# Patient Record
Sex: Female | Born: 1960 | Hispanic: Yes | Marital: Single | Smoking: Never smoker
Health system: Southern US, Community
[De-identification: ages and names within clinical notes are randomized; demographics above are authoritative.]

## PROBLEM LIST (undated history)

## (undated) DIAGNOSIS — D649 Anemia, unspecified: Secondary | ICD-10-CM

## (undated) DIAGNOSIS — I428 Other cardiomyopathies: Secondary | ICD-10-CM

## (undated) DIAGNOSIS — I509 Heart failure, unspecified: Secondary | ICD-10-CM

## (undated) DIAGNOSIS — Z598 Other problems related to housing and economic circumstances: Secondary | ICD-10-CM

## (undated) DIAGNOSIS — M25569 Pain in unspecified knee: Secondary | ICD-10-CM

## (undated) DIAGNOSIS — G8929 Other chronic pain: Secondary | ICD-10-CM

## (undated) DIAGNOSIS — R7989 Other specified abnormal findings of blood chemistry: Secondary | ICD-10-CM

## (undated) DIAGNOSIS — J189 Pneumonia, unspecified organism: Secondary | ICD-10-CM

## (undated) DIAGNOSIS — I4891 Unspecified atrial fibrillation: Secondary | ICD-10-CM

## (undated) DIAGNOSIS — C50919 Malignant neoplasm of unspecified site of unspecified female breast: Secondary | ICD-10-CM

## (undated) DIAGNOSIS — Z5989 Other problems related to housing and economic circumstances: Secondary | ICD-10-CM

## (undated) DIAGNOSIS — U071 COVID-19: Secondary | ICD-10-CM

## (undated) DIAGNOSIS — I5022 Chronic systolic (congestive) heart failure: Secondary | ICD-10-CM

## (undated) DIAGNOSIS — I493 Ventricular premature depolarization: Secondary | ICD-10-CM

## (undated) DIAGNOSIS — Z9119 Patient's noncompliance with other medical treatment and regimen: Secondary | ICD-10-CM

## (undated) DIAGNOSIS — Z5971 Insufficient health insurance coverage: Secondary | ICD-10-CM

## (undated) DIAGNOSIS — M1990 Unspecified osteoarthritis, unspecified site: Secondary | ICD-10-CM

## (undated) DIAGNOSIS — Z91199 Patient's noncompliance with other medical treatment and regimen due to unspecified reason: Secondary | ICD-10-CM

## (undated) DIAGNOSIS — E0590 Thyrotoxicosis, unspecified without thyrotoxic crisis or storm: Secondary | ICD-10-CM

## (undated) HISTORY — PX: BREAST CYST EXCISION: SHX579

## (undated) HISTORY — PX: TUBAL LIGATION: SHX77

---
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
## 2016-06-23 DIAGNOSIS — J189 Pneumonia, unspecified organism: Secondary | ICD-10-CM

## 2016-06-23 HISTORY — DX: Pneumonia, unspecified organism: J18.9

## 2016-07-19 DIAGNOSIS — I509 Heart failure, unspecified: Secondary | ICD-10-CM

## 2016-07-19 DIAGNOSIS — I429 Cardiomyopathy, unspecified: Secondary | ICD-10-CM

## 2016-07-21 DIAGNOSIS — I959 Hypotension, unspecified: Secondary | ICD-10-CM

## 2016-07-21 DIAGNOSIS — I5023 Acute on chronic systolic (congestive) heart failure: Secondary | ICD-10-CM

## 2016-07-21 DIAGNOSIS — I471 Supraventricular tachycardia: Secondary | ICD-10-CM

## 2016-07-21 DIAGNOSIS — J159 Unspecified bacterial pneumonia: Secondary | ICD-10-CM

## 2017-04-13 DIAGNOSIS — I420 Dilated cardiomyopathy: Secondary | ICD-10-CM

## 2017-04-13 DIAGNOSIS — R079 Chest pain, unspecified: Secondary | ICD-10-CM

## 2017-04-13 DIAGNOSIS — I5020 Unspecified systolic (congestive) heart failure: Secondary | ICD-10-CM

## 2017-04-13 DIAGNOSIS — I429 Cardiomyopathy, unspecified: Secondary | ICD-10-CM

## 2017-04-14 DIAGNOSIS — I509 Heart failure, unspecified: Secondary | ICD-10-CM

## 2017-04-15 ENCOUNTER — Ambulatory Visit (HOSPITAL_COMMUNITY): Admit: 2017-04-15 | Payer: Self-pay | Admitting: Internal Medicine

## 2017-04-15 ENCOUNTER — Encounter (HOSPITAL_COMMUNITY): Payer: Self-pay | Admitting: Physician Assistant

## 2017-04-15 ENCOUNTER — Encounter (HOSPITAL_COMMUNITY)
Admission: AD | Disposition: A | Discharge status: Home / Self Care | Payer: Self-pay | Source: Other Acute Inpatient Hospital | Attending: Cardiovascular Disease

## 2017-04-15 ENCOUNTER — Inpatient Hospital Stay (HOSPITAL_COMMUNITY)
Admission: AD | Admit: 2017-04-15 | Discharge: 2017-04-21 | DRG: 287 | Disposition: A | Discharge status: Home / Self Care | Payer: Self-pay | Source: Other Acute Inpatient Hospital | Attending: Cardiovascular Disease | Admitting: Cardiovascular Disease

## 2017-04-15 DIAGNOSIS — I5023 Acute on chronic systolic (congestive) heart failure: Principal | ICD-10-CM

## 2017-04-15 DIAGNOSIS — Z79899 Other long term (current) drug therapy: Secondary | ICD-10-CM

## 2017-04-15 DIAGNOSIS — I272 Pulmonary hypertension, unspecified: Secondary | ICD-10-CM | POA: Diagnosis present

## 2017-04-15 DIAGNOSIS — I240 Acute coronary thrombosis not resulting in myocardial infarction: Secondary | ICD-10-CM

## 2017-04-15 DIAGNOSIS — G8929 Other chronic pain: Secondary | ICD-10-CM | POA: Diagnosis present

## 2017-04-15 DIAGNOSIS — I429 Cardiomyopathy, unspecified: Secondary | ICD-10-CM | POA: Diagnosis present

## 2017-04-15 DIAGNOSIS — I513 Intracardiac thrombosis, not elsewhere classified: Secondary | ICD-10-CM

## 2017-04-15 DIAGNOSIS — I959 Hypotension, unspecified: Secondary | ICD-10-CM | POA: Diagnosis present

## 2017-04-15 DIAGNOSIS — I5022 Chronic systolic (congestive) heart failure: Secondary | ICD-10-CM

## 2017-04-15 DIAGNOSIS — I081 Rheumatic disorders of both mitral and tricuspid valves: Secondary | ICD-10-CM | POA: Diagnosis present

## 2017-04-15 DIAGNOSIS — I5021 Acute systolic (congestive) heart failure: Secondary | ICD-10-CM

## 2017-04-15 DIAGNOSIS — Z8249 Family history of ischemic heart disease and other diseases of the circulatory system: Secondary | ICD-10-CM

## 2017-04-15 DIAGNOSIS — I493 Ventricular premature depolarization: Secondary | ICD-10-CM | POA: Diagnosis present

## 2017-04-15 DIAGNOSIS — I420 Dilated cardiomyopathy: Secondary | ICD-10-CM

## 2017-04-15 HISTORY — DX: Unspecified osteoarthritis, unspecified site: M19.90

## 2017-04-15 HISTORY — DX: Acute coronary thrombosis not resulting in myocardial infarction: I24.0

## 2017-04-15 HISTORY — DX: Chronic systolic (congestive) heart failure: I50.22

## 2017-04-15 HISTORY — PX: CORONARY/GRAFT ANGIOGRAPHY: CATH118237

## 2017-04-15 HISTORY — DX: Other chronic pain: G89.29

## 2017-04-15 HISTORY — DX: Pain in unspecified knee: M25.569

## 2017-04-15 HISTORY — DX: Anemia, unspecified: D64.9

## 2017-04-15 HISTORY — PX: RIGHT HEART CATH: CATH118263

## 2017-04-15 HISTORY — DX: Pneumonia, unspecified organism: J18.9

## 2017-04-15 LAB — POCT I-STAT 3, VENOUS BLOOD GAS (G3P V)
ACID-BASE EXCESS: 2 mmol/L (ref 0.0–2.0)
Acid-Base Excess: 2 mmol/L (ref 0.0–2.0)
Acid-Base Excess: 3 mmol/L — ABNORMAL HIGH (ref 0.0–2.0)
BICARBONATE: 26.2 mmol/L (ref 20.0–28.0)
Bicarbonate: 26.6 mmol/L (ref 20.0–28.0)
Bicarbonate: 27 mmol/L (ref 20.0–28.0)
O2 SAT: 71 %
O2 SAT: 74 %
O2 Saturation: 70 %
PCO2 VEN: 39.3 mmHg — AB (ref 44.0–60.0)
PH VEN: 7.43 (ref 7.250–7.430)
TCO2: 27 mmol/L (ref 22–32)
TCO2: 28 mmol/L (ref 22–32)
TCO2: 28 mmol/L (ref 22–32)
pCO2, Ven: 38.9 mmHg — ABNORMAL LOW (ref 44.0–60.0)
pCO2, Ven: 39.6 mmHg — ABNORMAL LOW (ref 44.0–60.0)
pH, Ven: 7.443 — ABNORMAL HIGH (ref 7.250–7.430)
pH, Ven: 7.444 — ABNORMAL HIGH (ref 7.250–7.430)
pO2, Ven: 35 mmHg (ref 32.0–45.0)
pO2, Ven: 36 mmHg (ref 32.0–45.0)
pO2, Ven: 38 mmHg (ref 32.0–45.0)

## 2017-04-15 LAB — POCT I-STAT 3, ART BLOOD GAS (G3+)
ACID-BASE EXCESS: 1 mmol/L (ref 0.0–2.0)
BICARBONATE: 25 mmol/L (ref 20.0–28.0)
O2 Saturation: 96 %
TCO2: 26 mmol/L (ref 22–32)
pCO2 arterial: 35.9 mmHg (ref 32.0–48.0)
pH, Arterial: 7.451 — ABNORMAL HIGH (ref 7.350–7.450)
pO2, Arterial: 80 mmHg — ABNORMAL LOW (ref 83.0–108.0)

## 2017-04-15 LAB — HEMOGLOBIN A1C
HEMOGLOBIN A1C: 5.6 % (ref 4.8–5.6)
Mean Plasma Glucose: 114.02 mg/dL

## 2017-04-15 LAB — BRAIN NATRIURETIC PEPTIDE: B NATRIURETIC PEPTIDE 5: 444.4 pg/mL — AB (ref 0.0–100.0)

## 2017-04-15 SURGERY — RIGHT HEART CATH

## 2017-04-15 MED ORDER — HEPARIN SODIUM (PORCINE) 1000 UNIT/ML IJ SOLN
INTRAMUSCULAR | Status: AC
Start: 2017-04-15 — End: ?
  Filled 2017-04-15: qty 1

## 2017-04-15 MED ORDER — FENTANYL CITRATE (PF) 100 MCG/2ML IJ SOLN
INTRAMUSCULAR | Status: AC
  Filled 2017-04-15: qty 2

## 2017-04-15 MED ORDER — MIDAZOLAM HCL 2 MG/2ML IJ SOLN
INTRAMUSCULAR | Status: AC
  Filled 2017-04-15: qty 2

## 2017-04-15 MED ORDER — SODIUM CHLORIDE 0.9% FLUSH
3.0000 mL | Freq: Two times a day (BID) | INTRAVENOUS | Status: DC
  Administered 2017-04-15 – 2017-04-21 (×7): 3 mL via INTRAVENOUS

## 2017-04-15 MED ORDER — VERAPAMIL HCL 2.5 MG/ML IV SOLN
INTRAVENOUS | Status: DC | PRN
  Administered 2017-04-15: 18:00:00 via INTRA_ARTERIAL

## 2017-04-15 MED ORDER — HEPARIN (PORCINE) IN NACL 2-0.9 UNIT/ML-% IJ SOLN
INTRAMUSCULAR | Status: AC
  Filled 2017-04-15: qty 1000

## 2017-04-15 MED ORDER — SODIUM CHLORIDE 0.9% FLUSH: 3.0000 mL | INTRAVENOUS | Status: DC | PRN

## 2017-04-15 MED ORDER — IOPAMIDOL (ISOVUE-370) INJECTION 76%
INTRAVENOUS | Status: DC | PRN
  Administered 2017-04-15: 30 mL via INTRA_ARTERIAL

## 2017-04-15 MED ORDER — SODIUM CHLORIDE 0.9 % IV SOLN
INTRAVENOUS | Status: DC
  Administered 2017-04-15: 17:00:00 via INTRAVENOUS

## 2017-04-15 MED ORDER — MIDAZOLAM HCL 2 MG/2ML IJ SOLN
INTRAMUSCULAR | Status: DC | PRN
  Administered 2017-04-15: 0.5 mg via INTRAVENOUS

## 2017-04-15 MED ORDER — ASPIRIN EC 81 MG PO TBEC
81.0000 mg | DELAYED_RELEASE_TABLET | Freq: Every day | ORAL | Status: DC
  Administered 2017-04-16 – 2017-04-21 (×6): 81 mg via ORAL
  Filled 2017-04-15 (×6): qty 1

## 2017-04-15 MED ORDER — NITROGLYCERIN 0.4 MG SL SUBL
0.4000 mg | SUBLINGUAL_TABLET | SUBLINGUAL | Status: DC | PRN
Start: 2017-04-15 — End: 2017-04-21

## 2017-04-15 MED ORDER — SODIUM CHLORIDE 0.9 % IV SOLN: 250.0000 mL | INTRAVENOUS | Status: DC | PRN

## 2017-04-15 MED ORDER — SODIUM CHLORIDE 0.9% FLUSH: 3.0000 mL | Freq: Two times a day (BID) | INTRAVENOUS | Status: DC

## 2017-04-15 MED ORDER — HEPARIN SODIUM (PORCINE) 1000 UNIT/ML IJ SOLN
INTRAMUSCULAR | Status: DC | PRN
  Administered 2017-04-15: 3000 [IU] via INTRAVENOUS

## 2017-04-15 MED ORDER — FENTANYL CITRATE (PF) 100 MCG/2ML IJ SOLN
INTRAMUSCULAR | Status: DC | PRN
  Administered 2017-04-15: 12.5 ug via INTRAVENOUS

## 2017-04-15 MED ORDER — LIDOCAINE HCL (PF) 1 % IJ SOLN
INTRAMUSCULAR | Status: AC
  Filled 2017-04-15: qty 30

## 2017-04-15 MED ORDER — HEPARIN (PORCINE) IN NACL 100-0.45 UNIT/ML-% IJ SOLN
950.0000 [IU]/h | INTRAMUSCULAR | Status: DC
  Administered 2017-04-15: 950 [IU]/h via INTRAVENOUS
  Filled 2017-04-15 (×4): qty 250

## 2017-04-15 MED ORDER — VERAPAMIL HCL 2.5 MG/ML IV SOLN
INTRAVENOUS | Status: AC
  Filled 2017-04-15: qty 2

## 2017-04-15 MED ORDER — ATORVASTATIN CALCIUM 80 MG PO TABS
80.0000 mg | ORAL_TABLET | Freq: Every day | ORAL | Status: DC
  Administered 2017-04-16 – 2017-04-20 (×5): 80 mg via ORAL
  Filled 2017-04-15 (×5): qty 1

## 2017-04-15 MED ORDER — LIDOCAINE HCL (PF) 1 % IJ SOLN
INTRAMUSCULAR | Status: DC | PRN
  Administered 2017-04-15: 2 mL
  Administered 2017-04-15: 1 mL

## 2017-04-15 MED ORDER — ACETAMINOPHEN 325 MG PO TABS
650.0000 mg | ORAL_TABLET | ORAL | Status: DC | PRN
  Administered 2017-04-17 – 2017-04-19 (×2): 650 mg via ORAL
  Filled 2017-04-15 (×2): qty 2

## 2017-04-15 MED ORDER — HEPARIN BOLUS VIA INFUSION
4000.0000 [IU] | Freq: Once | INTRAVENOUS | Status: DC
  Filled 2017-04-15: qty 4000

## 2017-04-15 MED ORDER — ONDANSETRON HCL 4 MG/2ML IJ SOLN: 4.0000 mg | Freq: Four times a day (QID) | INTRAMUSCULAR | Status: DC | PRN

## 2017-04-15 MED ORDER — METOPROLOL SUCCINATE ER 25 MG PO TB24
12.5000 mg | ORAL_TABLET | Freq: Every day | ORAL | Status: DC
  Administered 2017-04-15: 12.5 mg via ORAL
  Filled 2017-04-15 (×2): qty 1

## 2017-04-15 MED ORDER — HEPARIN (PORCINE) IN NACL 100-0.45 UNIT/ML-% IJ SOLN: 950.0000 [IU]/h | INTRAMUSCULAR | Status: DC

## 2017-04-15 MED ORDER — HEPARIN (PORCINE) IN NACL 2-0.9 UNIT/ML-% IJ SOLN
INTRAMUSCULAR | Status: AC | PRN
  Administered 2017-04-15: 1000 mL

## 2017-04-15 MED ORDER — ASPIRIN 81 MG PO CHEW
81.0000 mg | CHEWABLE_TABLET | ORAL | Status: AC
  Administered 2017-04-15: 81 mg via ORAL
  Filled 2017-04-15: qty 1

## 2017-04-15 SURGICAL SUPPLY — 13 items
CATH BALLN WEDGE 5F 110CM (CATHETERS) ×3 IMPLANT
CATH OPTITORQUE TIG 4.0 5F (CATHETERS) ×3 IMPLANT
COVER PRB 48X5XTLSCP FOLD TPE (BAG) ×1 IMPLANT
COVER PROBE 5X48 (BAG) ×2
DEVICE RAD COMP TR BAND LRG (VASCULAR PRODUCTS) ×3 IMPLANT
GLIDESHEATH SLEND SS 6F .021 (SHEATH) ×3 IMPLANT
GUIDEWIRE INQWIRE 1.5J.035X260 (WIRE) ×1 IMPLANT
INQWIRE 1.5J .035X260CM (WIRE) ×3
KIT HEART LEFT (KITS) ×3 IMPLANT
PACK CARDIAC CATHETERIZATION (CUSTOM PROCEDURE TRAY) ×3 IMPLANT
SHEATH GLIDE SLENDER 4/5FR (SHEATH) ×3 IMPLANT
TRANSDUCER W/STOPCOCK (MISCELLANEOUS) ×3 IMPLANT
TUBING CIL FLEX 10 FLL-RA (TUBING) ×3 IMPLANT

## 2017-04-15 NOTE — Brief Op Note (Addendum)
Brief Cardiac Catheterization Note  Date: 04/15/2017 Time: 6:10 PM  PATIENT:  Megan Keith  56 y.o. 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 Mildly moderately elevated left heart filling pressures. 2. Mild pulmonary hypertension. 3. Normal right heart filling pressure. 4. Normal Fick cardiac output/index. 5. No angiographically significant coronary artery disease.  RECOMMENDATIONS: 1. Initiate metoprolol succinate 12.5 mg daily at bedtime. 2. If blood pressure and renal function tolerate, consider adding low-dose ACE inhibitor or ARB tomorrow. 3. Consider restarting gradual diuresis tomorrow, based on renal function. 4. Restart heparin infusion 2 hours after TR band deflation, given concern for apical thrombus.  Nelva Bush, MD [facility] Pager: [phone]

## 2017-04-15 NOTE — H&P (Addendum)
Cardiology Admission History and Physical:   Patient ID: Megan Keith; MRN: [id number]; DOB: May 11, 1961   Admission date: 04/15/2017  Primary Care Provider: No primary care provider on file. Primary Cardiologist: Dr. [name]  Chief Complaint:  CP and SOB  Patient Profile:   Megan Keith is a 56 y.o. non english speaking  female with a history of with a history of chronic systolic heart failure followed by Dr. [name] transfer from [facility] due to abnormal echocardiogram and stress test.  Patient had normal cath in 2010. Last noted EF per note 35%. Last seen by Dr. [name] 2 months ago.  History of Present Illness:   [name] presented to the [facility] with few days history of neck pain (left side) radiating to mid sternal area. She described the pain as a sharp. Also has shortness of breath, orthopnea and PND. She was admitted for further evaluation. EKG showed normal sinus rhythm without acute ischemic changes. Troponin negative. However BNP > 2500. She was treated with IV Lasix. Creatinine normal electrolyte normal. Echocardiogram showed left ventricular function approximately 10%, severely dilated left atrium, global hypokinesis, moderate to severe mitral regurgitation, moderate to severe TR, cannot exclude thrombus/mass at Apex. Lexiscan showed slight inferior lateral reversibility suggestive of ischemia. Patient is transfered to [facility] for further evaluation.  Patient currently complaine of intermittent sharp chest pain. This worsened with deep breath and movement. Denies recent surgery or travel.  Past Medical History:  Diagnosis Date  . Chronic systolic CHF (congestive heart failure) (HCC)     No past surgical history on file.   Medications Prior to Admission: Reviewed from records.   Allergies:   Allergies not on file  Social History:   Social History   Social History  . Marital status: Single    Spouse  name: N/A  . Number of children: N/A  . Years of education: N/A   Occupational History  . Not on file.   Social History Main Topics  . Smoking status: Not on file  . Smokeless tobacco: Not on file  . Alcohol use Not on file  . Drug use: Unknown  . Sexual activity: Not on file   Other Topics Concern  . Not on file   Social History Narrative  . No narrative on file    Family History:   The patient's family history includes Hypertension in her mother.    ROS:  Please see the history of present illness.  All other ROS reviewed and negative.     Physical Exam/Data:   Vitals:   04/15/17 1410  BP: (!) 84/67  Pulse: 70  Temp: 98.7 F (37.1 C)  TempSrc: Oral  SpO2: 100%  Weight: 136 lb 11.2 oz (62 kg)  Height: 5' (1.524 m)   No intake or output data in the 24 hours ending 04/15/17 1450 Filed Weights   04/15/17 1410  Weight: 136 lb 11.2 oz (62 kg)   Body mass index is 26.7 kg/m.  General:  Well nourished, well developed, in no acute distress HEENT: normal Lymph: no adenopathy Neck: + JVD Endocrine:  No thryomegaly Vascular: No carotid bruits; FA pulses 2+ bilaterally without bruits  Cardiac:  normal S1, S2; RRR; soft systolic murmur Lungs:  clear to auscultation bilaterally, no wheezing, rhonchi or rales  Abd: soft, nontender, no hepatomegaly  Ext: no edema Musculoskeletal:  No deformities, BUE and BLE strength normal and equal Skin: warm and dry  Neuro:  CNs 2-12 intact, no  focal abnormalities noted Psych:  Normal affect    EKG:  The ECG that was done [name] was personally reviewed and demonstrates NSR. No acute ischemic changes  Relevant CV Studies: As discussed above  Laboratory Data:  ChemistryNo results for input(s): NA, K, CL, CO2, GLUCOSE, BUN, CREATININE, CALCIUM, GFRNONAA, GFRAA, ANIONGAP in the last 168 hours.  No results for input(s): PROT, ALBUMIN, AST, ALT, ALKPHOS, BILITOT in the last 168 hours. HematologyNo results for input(s): WBC, RBC,  HGB, HCT, MCV, MCH, MCHC, RDW, PLT in the last 168 hours. Cardiac EnzymesNo results for input(s): TROPONINI in the last 168 hours. No results for input(s): TROPIPOC in the last 168 hours.  BNPNo results for input(s): BNP, PROBNP in the last 168 hours.  DDimer No results for input(s): DDIMER in the last 168 hours.  Radiology/Studies:  No results found.  Assessment and Plan:   1. Acute on chronic systolic heart failure - BNP > 2500. Chest x-ray clear. Echocardiogram showed left ventricular function approximately 10%, severely dilated left atrium, global hypokinesis, moderate to severe mitral regurgitation, moderate to severe task or regurgitation, cannot exclude thrombus/mass at Apex. Lexiscan showed slight inferior lateral tibial stability suggestive of ischemia.  - The patient will need L & R cath. She is currently hypotensive. Start IV heparin for possible thrombus. Will get limited echo with definite study.    Severity of Illness: The appropriate patient status for this patient is INPATIENT. Inpatient status is judged to be reasonable and necessary in order to provide the required intensity of service to ensure the patient's safety. The patient's presenting symptoms, physical exam findings, and initial radiographic and laboratory data in the context of their chronic comorbidities is felt to place them at high risk for further clinical deterioration. Furthermore, it is not anticipated that the patient will be medically stable for discharge from the hospital within 2 midnights of admission. The following factors support the patient status of inpatient.   " The patient's presenting symptoms include--> CP and SOB. " The worrisome physical exam findings include elevated JVD and murmur " The initial radiographic and laboratory data are worrisome because of abnormal stress test and echocardiogram " The chronic co-morbidities include chronic systolic heart failure   * I certify that at the point of  admission it is my clinical judgment that the patient will require inpatient hospital care spanning beyond 2 midnights from the point of admission due to high intensity of service, high risk for further deterioration and high frequency of surveillance required.*   Signed, [place], [place]  04/15/2017 2:50 PM   Patient seen, examined. Available data reviewed. Agree with findings, assessment, and plan as outlined by Robbie Lis, PA-C.   My exam today: Vitals:   04/15/17 1410  BP: (!) 84/67  Pulse: 70  Temp: 98.7 F (37.1 C)  SpO2: 100%   Pt is alert and oriented, NAD HEENT: normal Neck: JVP - normal, carotids 2+= without bruits Lungs: CTA bilaterally CV: RRR with 2/6 systolic murmur at the LLSB, LV apex diffuse Abd: soft, NT, Positive BS, no hepatomegaly Ext: no C/C/E, distal pulses intact and equal Skin: warm/dry no rash   The patient is interviewed with her granddaughter at the bedside who serves as an Astronomer. The patient has pleuritic chest discomfort. She is noted to have very severe LV dysfunction with possible LV apical thrombus. She is transferred here for diagnostic right/left heart catheterization and possible PCI if indicated. I suspect she will have nonischemic cardiomyopathy. Will order a limited echo  with definity contrast to better evaluate her LV apex for thrombus. I have reviewed the risks, indications, and alternatives to cardiac catheterization, possible angioplasty, and stenting with the patient. Risks include but are not limited to bleeding, infection, vascular injury, stroke, myocardial infection, arrhythmia, kidney injury, radiation-related injury in the case of prolonged fluoroscopy use, emergency cardiac surgery, and death. The patient understands the risks of serious complication is 1-2 in 1791 with diagnostic cardiac cath and 1-2% or less with angioplasty/stenting.  Would avoid any catheters in the LV for her cath procedure since there is a question of  thrombus. Would initiate anticoagulation with heparin after cath while we await echo imaging for further assessment of LV thrombus. Will begin CHF Rx post-cath as well (as BP tolerates).  Sherren Mocha, M.D. 04/15/2017 4:22 PM

## 2017-04-15 NOTE — Progress Notes (Addendum)
ANTICOAGULATION CONSULT NOTE - Initial Consult  Pharmacy Consult for Heparin Indication: Possible apical thrombus  Allergies not on file  Patient Measurements: Height: 5' (152.4 cm) Weight: 136 lb 11.2 oz (62 kg) IBW/kg (Calculated) : 45.5 Heparin Dosing Weight: 58kg  Vital Signs: Temp: 98.7 F (37.1 C) (08/24 1410) Temp Source: Oral (08/24 1410) BP: 84/67 (08/24 1410) Pulse Rate: 70 (08/24 1410)  Labs: No results for input(s): HGB, HCT, PLT, APTT, LABPROT, INR, HEPARINUNFRC, HEPRLOWMOCWT, CREATININE, CKTOTAL, CKMB, TROPONINI in the last 72 hours.  CrCl cannot be calculated (No order found.).   Medical History: Past Medical History:  Diagnosis Date  . Chronic systolic CHF (congestive heart failure) (HCC)    Assessment: 56yof to begin heparin for possible apical thrombus seen on ECHO at [place]. Baseline labs pending. No anticoagulants pta.  Goal of Therapy:  Heparin level 0.3-0.7 units/ml Monitor platelets by anticoagulation protocol: Yes   Plan:  1) Heparin bolus 4000 units x 1 2) Heparin drip at 950 units/hr 3) 6 hour heparin level 4) Daily heparin level and CBC  Deboraha Sprang 04/15/2017,4:21 PM   ADDENDUM: Patient will not begin heparin until after cath per Dr. Burt Knack. Will discontinue current orders for heparin bolus and drip and will wait to receive orders to start heparin post-cath.  Deboraha Sprang 04/15/2017, 4:37 PM   ADDENDUM: Patient back from cath and heparin to start 2 hours post TR band deflation. Per RN, TR band will be deflated around 2000/2030 as long as there is no bleeding.  Plan: 1) At 2230, begin heparin at 950 units/hr with no bolus 2) Daily heparin level and CBC  Deboraha Sprang 04/15/2017, 6:46 PM

## 2017-04-15 NOTE — Interval H&P Note (Signed)
History and Physical Interval Note:  04/15/2017 5:09 PM  Megan Keith  has presented today for cardiac catheterization, with the diagnosis of acute systolic heart failure. The various methods of treatment have been discussed with the patient and family. After consideration of risks, benefits and other options for treatment, the patient has consented to  Procedure(s): RIGHT/LEFT HEART CATH AND CORONARY ANGIOGRAPHY (N/A) as a surgical intervention .  The patient's history has been reviewed, patient examined, no change in status, stable for surgery.  I have reviewed the patient's chart and labs.  Questions were answered to the patient's satisfaction.    Cath Lab Visit (complete for each Cath Lab visit)  Clinical Evaluation Leading to the Procedure:   ACS: Yes.    Non-ACS:    Anginal Classification: CCS class IV  Anti-ischemic medical therapy: No Therapy  Non-Invasive Test Results: High-risk stress test findings: cardiac mortality >3%/year (LVEF <35%, inferolateral ischemia)  Prior CABG: No previous CABG  Carlyne Keehan

## 2017-04-15 NOTE — Progress Notes (Signed)
I notified Wannetta Sender of patients arrival. She stated she would send somebody.

## 2017-04-16 ENCOUNTER — Inpatient Hospital Stay (HOSPITAL_COMMUNITY): Payer: Self-pay

## 2017-04-16 DIAGNOSIS — I361 Nonrheumatic tricuspid (valve) insufficiency: Secondary | ICD-10-CM

## 2017-04-16 DIAGNOSIS — I420 Dilated cardiomyopathy: Secondary | ICD-10-CM

## 2017-04-16 DIAGNOSIS — I513 Intracardiac thrombosis, not elsewhere classified: Secondary | ICD-10-CM

## 2017-04-16 LAB — LIPID PANEL
CHOL/HDL RATIO: 3.2 ratio
Cholesterol: 168 mg/dL (ref 0–200)
HDL: 52 mg/dL (ref >40)
LDL CALC: 103 mg/dL — AB (ref 0–99)
Triglycerides: 67 mg/dL (ref <150)
VLDL: 13 mg/dL (ref 0–40)

## 2017-04-16 LAB — CBC
HCT: 42.2 % (ref 36.0–46.0)
Hemoglobin: 14.4 g/dL (ref 12.0–15.0)
MCH: 30.3 pg (ref 26.0–34.0)
MCHC: 34.1 g/dL (ref 30.0–36.0)
MCV: 88.8 fL (ref 78.0–100.0)
PLATELETS: 270 10*3/uL (ref 150–400)
RBC: 4.75 MIL/uL (ref 3.87–5.11)
RDW: 13.7 % (ref 11.5–15.5)
WBC: 8.4 10*3/uL (ref 4.0–10.5)

## 2017-04-16 LAB — ECHOCARDIOGRAM LIMITED
Height: 60 in
Weight: 2176 oz

## 2017-04-16 LAB — COOXEMETRY PANEL
CARBOXYHEMOGLOBIN: 0.8 % (ref 0.5–1.5)
Methemoglobin: 0.8 % (ref 0.0–1.5)
O2 SAT: 71.6 %
TOTAL HEMOGLOBIN: 14.5 g/dL (ref 12.0–16.0)

## 2017-04-16 LAB — BASIC METABOLIC PANEL
ANION GAP: 9 (ref 5–15)
BUN: 20 mg/dL (ref 6–20)
CO2: 24 mmol/L (ref 22–32)
Calcium: 9.4 mg/dL (ref 8.9–10.3)
Chloride: 103 mmol/L (ref 101–111)
Creatinine, Ser: 0.74 mg/dL (ref 0.44–1.00)
GFR calc Af Amer: >60 mL/min (ref >60)
GFR calc non Af Amer: >60 mL/min (ref >60)
GLUCOSE: 92 mg/dL (ref 65–99)
POTASSIUM: 3.7 mmol/L (ref 3.5–5.1)
Sodium: 136 mmol/L (ref 135–145)

## 2017-04-16 LAB — HEPARIN LEVEL (UNFRACTIONATED)
HEPARIN UNFRACTIONATED: 0.35 [IU]/mL (ref 0.30–0.70)
Heparin Unfractionated: 0.43 IU/mL (ref 0.30–0.70)

## 2017-04-16 LAB — HIV ANTIBODY (ROUTINE TESTING W REFLEX): HIV SCREEN 4TH GENERATION: NONREACTIVE

## 2017-04-16 MED ORDER — SODIUM CHLORIDE 0.9% FLUSH: 10.0000 mL | INTRAVENOUS | Status: DC | PRN

## 2017-04-16 MED ORDER — PERFLUTREN LIPID MICROSPHERE
1.0000 mL | INTRAVENOUS | Status: AC | PRN
  Administered 2017-04-16: 2 mL via INTRAVENOUS
  Filled 2017-04-16: qty 10

## 2017-04-16 MED ORDER — DIGOXIN 250 MCG PO TABS
0.2500 mg | ORAL_TABLET | Freq: Every day | ORAL | Status: DC
  Administered 2017-04-16 – 2017-04-18 (×3): 0.25 mg via ORAL
  Filled 2017-04-16 (×3): qty 1

## 2017-04-16 MED ORDER — SODIUM CHLORIDE 0.9% FLUSH
10.0000 mL | Freq: Two times a day (BID) | INTRAVENOUS | Status: DC
  Administered 2017-04-16: 10 mL
  Administered 2017-04-16: 30 mL
  Administered 2017-04-17: 10 mL
  Administered 2017-04-18: 30 mL
  Administered 2017-04-18: 10 mL
  Administered 2017-04-19: 30 mL
  Administered 2017-04-20: 40 mL
  Administered 2017-04-20: 10 mL

## 2017-04-16 NOTE — Progress Notes (Signed)
ANTICOAGULATION CONSULT NOTE - Follow Up Consult  Pharmacy Consult for Heparin Indication: possible apical thrombus  No Known Allergies  Patient Measurements: Height: 5' (152.4 cm) Weight: 136 lb (61.7 kg) IBW/kg (Calculated) : 45.5 Heparin Dosing Weight: 58 kg  Vital Signs: Temp: 97.8 F (36.6 C) (08/25 1218) Temp Source: Oral (08/25 1218) BP: 95/69 (08/25 1320) Pulse Rate: 105 (08/25 1320)  Labs:  Recent Labs  04/16/17 0438 04/16/17 1238  HGB 14.4  --   HCT 42.2  --   PLT 270  --   HEPARINUNFRC 0.35 0.43  CREATININE 0.74  --     Estimated Creatinine Clearance: 64.5 mL/min (by C-G formula based on SCr of 0.74 mg/dL).   Medications:  Infusions:  . sodium chloride    . heparin 950 Units/hr (04/16/17 0700)    Assessment: 56yo female on heparin for possible apical thrombus seen on ECHO at [place]. Not on AC at home. ECHO on 8/25 conducted to assess LV thrombus, results pending. Heparin level therapeutic x 2 post initiation after cath.   CBC nml, no bleeding noted.   Goal of Therapy:  Heparin level 0.3-0.7 units/ml Monitor platelets by anticoagulation protocol: Yes   Plan:  Continue Heparin gtt 950 units/hour Daily HL and CBC Monitor for s/s of bleeding  Leroy Libman, PharmD Pharmacy Resident Pager: [phone] 04/16/2017,1:30 PM

## 2017-04-16 NOTE — Progress Notes (Addendum)
Progress Note  Patient Name: Megan Keith Date of Encounter: 04/16/2017  Primary Cardiologist: Dr. [name]  Subjective   No complaints this am.  Denies chest pain or SOB  Inpatient Medications    Scheduled Meds: . aspirin EC  81 mg Oral Daily  . atorvastatin  80 mg Oral q1800  . metoprolol succinate  12.5 mg Oral QHS  . sodium chloride flush  3 mL Intravenous Q12H   Continuous Infusions: . sodium chloride    . heparin 950 Units/hr (04/16/17 0300)   PRN Meds: sodium chloride, acetaminophen, nitroGLYCERIN, ondansetron (ZOFRAN) IV, perflutren lipid microspheres (DEFINITY) IV suspension, sodium chloride flush   Vital Signs    Vitals:   04/16/17 0300 04/16/17 0610 04/16/17 0700 04/16/17 0844  BP: 90/66 (!) 87/75 92/70 (!) 81/69  Pulse: 88 90 88 99  Resp: 16 (!) 32  (!) 22  Temp: 98 F (36.7 C) 98 F (36.7 C)  98.7 F (37.1 C)  TempSrc:  Oral  Oral  SpO2: 98%   99%  Weight:  136 lb (61.7 kg)    Height:        Intake/Output Summary (Last 24 hours) at 04/16/17 1032 Last data filed at 04/16/17 0300  Gross per 24 hour  Intake            347.5 ml  Output                0 ml  Net            347.5 ml   Filed Weights   04/15/17 1410 04/16/17 0610  Weight: 136 lb 11.2 oz (62 kg) 136 lb (61.7 kg)    Telemetry    NSR - Personally Reviewed  ECG    No new EKG to review - Personally Reviewed  Physical Exam   GEN: No acute distress.   Neck: No JVD Cardiac: RRR, no murmurs, rubs, or gallops.  Respiratory: Clear to auscultation bilaterally. GI: Soft, nontender, non-distended  MS: No edema; No deformity. Neuro:  Nonfocal  Psych: Normal affect   Labs    Chemistry Recent Labs Lab 04/16/17 0438  NA 136  K 3.7  CL 103  CO2 24  GLUCOSE 92  BUN 20  CREATININE 0.74  CALCIUM 9.4  GFRNONAA >60  GFRAA >60  ANIONGAP 9     Hematology Recent Labs Lab 04/16/17 0438  WBC 8.4  RBC 4.75  HGB 14.4  HCT 42.2  MCV 88.8  MCH 30.3  MCHC 34.1    RDW 13.7  PLT 270    Cardiac EnzymesNo results for input(s): TROPONINI in the last 168 hours. No results for input(s): TROPIPOC in the last 168 hours.   BNP Recent Labs Lab 04/15/17 1631  BNP 444.4*     DDimer No results for input(s): DDIMER in the last 168 hours.   Radiology    No results found.  Cardiac Studies   04/15/2017 Cardiac cath Conclusion   Conclusions: 1. Mildly moderately elevated left heart filling pressures. 2. Mild pulmonary hypertension. 3. Normal right heart filling pressure. 4. Normal Fick cardiac output/index. 5. No angiographically significant coronary artery disease.  Recommendations: 1. Initiate metoprolol succinate 12.5 mg daily at bedtime. 2. If blood pressure and renal function tolerate, consider adding low-dose ACE inhibitor or ARB tomorrow. 3. Consider restarting gradual diuresis tomorrow, based on renal function. 4. Restart heparin infusion 2 hours after TR band deflation, given concern for apical thrombus.     Patient Profile  56 y.o. female non english speaking  female with a history of chronic systolic heart failure followed by Dr. Bettina Keith transfered from [facility] due to abnormal echocardiogram and stress test associated with atypical sharp chest pain with SOB, PND and orthopnea with normal trop and BNP 2500.  Patient had normal cath in 2010. Last noted EF per note 35%.   Assessment & Plan    1.  Acute on chronic systolic CHF - she has a history of EF of 35% in the past and normal cath in 2010.  Echo done at [facility] showed an EF of 10% with moderate to severe MR, moderate to severe TR and possible apical thrombus. - cath showed no CAD with normal right heart pressures, mildly elevated left heart pressures with PCWP 33mmHg and mild pulmonary HTN.   - currently not on IV Lasix but pressure soft right now - awaiting 2D echo with definity to assess for LV thrombus - continue IV heparin gtt for now - stop BB due to soft BP  with SBP in 80-90mmHg.   - BP too soft to add ARB at this time - add digoxin 0.25mg  daily - Will ask AHF service to see - likely needs inotropic support - if using milrinone would need to add Levo gtt for pressor support.  She may not tolerate low dobutamine as she was tachycardic yesterday - will get a PICC line and assess CVP and Co Ox  2.  Severe DCM with EF 10% by recent echo.  Last echo prior to this was 35% EF.  ? Etiology.  No CAD on cath.   - await repeat limited echo with defininty contrast to assess for LV thromus that was ? On last echo  I have spent a total of 35 minutes with patient reviewing echo , telemetry, EKGs, labs and examining patient as well as establishing an assessment and plan that was discussed with the patient.  > 50% of time was spent in direct patient care.    Signed, Fransico Him, MD  04/16/2017, 10:32 AM

## 2017-04-16 NOTE — Progress Notes (Signed)
Peripherally Inserted Central Catheter/Midline Placement  The IV Nurse has discussed with the patient and/or persons authorized to consent for the patient, the purpose of this procedure and the potential benefits and risks involved with this procedure.  The benefits include less needle sticks, lab draws from the catheter, and the patient may be discharged home with the catheter. Risks include, but not limited to, infection, bleeding, blood clot (thrombus formation), and puncture of an artery; nerve damage and irregular heartbeat and possibility to perform a PICC exchange if needed/ordered by physician.  Alternatives to this procedure were also discussed.  Bard Power PICC patient education guide, fact sheet on infection prevention and patient information card has been provided to patient /or left at bedside.  Lorriane Shire RN translated for pt and husband.  All questions answered, spanish consent signed.  PICC/Midline Placement Documentation  PICC Triple Lumen 04/16/17 PICC Right Brachial 36 cm 0 cm (Active)  Indication for Insertion or Continuance of Line Vasoactive infusions;Chronic illness with exacerbations (CF, Sickle Cell, etc.);Prolonged intravenous therapies 04/16/2017  3:13 PM  Exposed Catheter (cm) 0 cm 04/16/2017  3:13 PM  Site Assessment Clean;Dry;Intact 04/16/2017  3:13 PM  Lumen #1 Status Flushed;Saline locked;Blood return noted 04/16/2017  3:13 PM  Lumen #2 Status Flushed;Saline locked;Blood return noted 04/16/2017  3:13 PM  Lumen #3 Status Flushed;Saline locked;Blood return noted 04/16/2017  3:13 PM  Dressing Type Transparent 04/16/2017  3:13 PM  Dressing Status Clean;Dry;Intact;Antimicrobial disc in place 04/16/2017  3:13 PM  Line Care Connections checked and tightened 04/16/2017  3:13 PM  Line Adjustment (NICU/IV Team Only) No 04/16/2017  3:13 PM  Dressing Intervention New dressing 04/16/2017  3:13 PM  Dressing Change Due 04/23/17 04/16/2017  3:13 PM       Rolena Infante 04/16/2017, 3:15  PM

## 2017-04-16 NOTE — Plan of Care (Addendum)
Problem: Education: Goal: Knowledge of [facility] Education information/materials will improve Outcome: Progressing Patient spanish speaking only. Bedside translator utilized. Patient denies complaints of chest pain. Right radial cath site now with gauze and tegaderm dressing. No complications encountered while deflating TR band. Patient with soft systolic bloof pressures [id number]. Asymptomatic. NSR on tele with frequent PAC and occassionally in bigemeny on tele. Evening metoprolol administered. Blood pressures still soft 85-97sbp, rate in the 90's. Heparin drip initiated 2210 at 950units/hr.

## 2017-04-16 NOTE — Progress Notes (Signed)
  Echocardiogram 2D Echocardiogram has been performed.  Lynde Ludwig 04/16/2017, 10:54 AM

## 2017-04-16 NOTE — Progress Notes (Signed)
ANTICOAGULATION CONSULT NOTE - Follow Up Consult  Pharmacy Consult for heparin Indication: possible apical thrombus  Labs:  Recent Labs  04/16/17 0438  HGB 14.4  HCT 42.2  PLT 270  HEPARINUNFRC 0.35  CREATININE 0.74    Assessment/Plan:  56yo female therapeutic on heparin with initial dosing after cath. Will continue gtt at current rate and confirm stable with additional level.   Wynona Neat, PharmD, BCPS  04/16/2017,7:00 AM

## 2017-04-17 ENCOUNTER — Other Ambulatory Visit (HOSPITAL_COMMUNITY): Payer: Self-pay

## 2017-04-17 LAB — CBC
HEMATOCRIT: 40.2 % (ref 36.0–46.0)
HEMOGLOBIN: 14 g/dL (ref 12.0–15.0)
MCH: 31.4 pg (ref 26.0–34.0)
MCHC: 34.8 g/dL (ref 30.0–36.0)
MCV: 90.1 fL (ref 78.0–100.0)
Platelets: 217 10*3/uL (ref 150–400)
RBC: 4.46 MIL/uL (ref 3.87–5.11)
RDW: 13.7 % (ref 11.5–15.5)
WBC: 10.6 10*3/uL — ABNORMAL HIGH (ref 4.0–10.5)

## 2017-04-17 LAB — COOXEMETRY PANEL
CARBOXYHEMOGLOBIN: 1.4 % (ref 0.5–1.5)
METHEMOGLOBIN: 0.6 % (ref 0.0–1.5)
O2 Saturation: 64.2 %
TOTAL HEMOGLOBIN: 14 g/dL (ref 12.0–16.0)

## 2017-04-17 LAB — HEPARIN LEVEL (UNFRACTIONATED): Heparin Unfractionated: 0.39 IU/mL (ref 0.30–0.70)

## 2017-04-17 NOTE — Plan of Care (Signed)
Problem: Education: Goal: Knowledge of [facility] Education information/materials will improve Outcome: Progressing Patient with uneventful evening. Room air. SBP 90-low 100's. HR  ST with pacs 90's. Heparin insuring at 950units/hour via triple lumen PICC. Patient without complaints of pain.

## 2017-04-17 NOTE — Progress Notes (Signed)
ANTICOAGULATION CONSULT NOTE - Follow Up Consult  Pharmacy Consult for Heparin Indication: possible apical thrombus  No Known Allergies  Patient Measurements: Height: 5' (152.4 cm) Weight: 137 lb 8 oz (62.4 kg) IBW/kg (Calculated) : 45.5 Heparin Dosing Weight: 58 kg  Vital Signs: Temp: 98.2 F (36.8 C) (08/26 0737) Temp Source: Oral (08/26 0737) BP: 96/64 (08/26 0737) Pulse Rate: 94 (08/26 0655)  Labs:  Recent Labs  04/16/17 0438 04/16/17 1238 04/17/17 0422  HGB 14.4  --  14.0  HCT 42.2  --  40.2  PLT 270  --  217  HEPARINUNFRC 0.35 0.43 0.39  CREATININE 0.74  --   --     Estimated Creatinine Clearance: 64.8 mL/min (by C-G formula based on SCr of 0.74 mg/dL).   Medications:  Infusions:  . sodium chloride    . heparin 950 Units/hr (04/17/17 0400)    Assessment: 56yo female on heparin for possible apical thrombus seen on ECHO at [place]. Not on AC at home. ECHO yesterday on 8/25 shows EF 15%. A limited ECHO scheduled for today to rule out apical thrombus. Heparin level remains therapeutic.   CBC nml, no bleeding noted.   Goal of Therapy:  Heparin level 0.3-0.7 units/ml Monitor platelets by anticoagulation protocol: Yes   Plan:  Continue Heparin gtt 950 units/hour Daily HL and CBC Monitor for s/s of bleeding  Leroy Libman, PharmD Pharmacy Resident Pager: [phone] 04/17/2017,8:32 AM

## 2017-04-17 NOTE — Progress Notes (Signed)
Progress Note  Patient Name: Megan Keith Date of Encounter: 04/17/2017  Primary Cardiologist: Dr. [name]  Subjective   Feeling better today.  Denies any chest pain or SOB.  CVP 4 and CO ox 71 yesterday and 64 today  Inpatient Medications    Scheduled Meds: . aspirin EC  81 mg Oral Daily  . atorvastatin  80 mg Oral q1800  . digoxin  0.25 mg Oral Daily  . sodium chloride flush  10-40 mL Intracatheter Q12H  . sodium chloride flush  3 mL Intravenous Q12H   Continuous Infusions: . sodium chloride    . heparin 950 Units/hr (04/17/17 0400)   PRN Meds: sodium chloride, acetaminophen, nitroGLYCERIN, ondansetron (ZOFRAN) IV, sodium chloride flush, sodium chloride flush   Vital Signs    Vitals:   04/17/17 0407 04/17/17 0636 04/17/17 0655 04/17/17 0737  BP: 96/72 95/70  96/64  Pulse: 95 92 94   Resp: 20 18 16  (!) 21  Temp: 98.2 F (36.8 C) 98.2 F (36.8 C)  98.2 F (36.8 C)  TempSrc: Oral Oral  Oral  SpO2: 97% 97% 98% 98%  Weight:  137 lb 8 oz (62.4 kg)    Height:        Intake/Output Summary (Last 24 hours) at 04/17/17 0758 Last data filed at 04/17/17 0400  Gross per 24 hour  Intake            937.5 ml  Output                0 ml  Net            937.5 ml   Filed Weights   04/15/17 1410 04/16/17 0610 04/17/17 0636  Weight: 136 lb 11.2 oz (62 kg) 136 lb (61.7 kg) 137 lb 8 oz (62.4 kg)    Telemetry    NSR - Personally Reviewed  ECG    No new EKG to review - Personally Reviewed  Physical Exam   GEN:WD, WN in NAD  Neck:no JVD Cardiac: RRR with no M/R/G  Respiratory: CTA bilaterally GI: soft, NT, ND with active BS MS: no edema Neuro: nonfocal Psych: normal affect  Labs    Chemistry  Recent Labs Lab 04/16/17 0438  NA 136  K 3.7  CL 103  CO2 24  GLUCOSE 92  BUN 20  CREATININE 0.74  CALCIUM 9.4  GFRNONAA >60  GFRAA >60  ANIONGAP 9     Hematology  Recent Labs Lab 04/16/17 0438 04/17/17 0422  WBC 8.4 10.6*  RBC 4.75 4.46   HGB 14.4 14.0  HCT 42.2 40.2  MCV 88.8 90.1  MCH 30.3 31.4  MCHC 34.1 34.8  RDW 13.7 13.7  PLT 270 217    Cardiac EnzymesNo results for input(s): TROPONINI in the last 168 hours. No results for input(s): TROPIPOC in the last 168 hours.   BNP  Recent Labs Lab 04/15/17 1631  BNP 444.4*     DDimer No results for input(s): DDIMER in the last 168 hours.   Radiology    No results found.  Cardiac Studies   04/15/2017 Cardiac cath Conclusion   Conclusions: 1. Mildly moderately elevated left heart filling pressures. 2. Mild pulmonary hypertension. 3. Normal right heart filling pressure. 4. Normal Fick cardiac output/index. 5. No angiographically significant coronary artery disease.  Recommendations: 1. Initiate metoprolol succinate 12.5 mg daily at bedtime. 2. If blood pressure and renal function tolerate, consider adding low-dose ACE inhibitor or ARB tomorrow. 3. Consider restarting gradual diuresis tomorrow,  based on renal function. 4. Restart heparin infusion 2 hours after TR band deflation, given concern for apical thrombus.   2D echo 04/16/2017 Study Conclusions  - Left ventricle: The cavity size was mildly dilated. Systolic   function was severely reduced. The estimated ejection fraction   was 15-20%. Wall motion was normal; there were no regional wall   motion abnormalities. The study is not technically sufficient to   allow evaluation of LV diastolic function. - Mitral valve: There was mild to moderate regurgitation directed   centrally. - Left atrium: The atrium was moderately dilated.   Anterior-posterior dimension: 46 mm. - Right ventricle: Systolic pressure was increased. The estimated   peak pressure was 42 mm Hg. - Tricuspid valve: There was moderate regurgitation. Peak RV-RA   gradient (S): 42 mm Hg. - Pulmonic valve: There was trivial regurgitation. - Pulmonary arteries: Systolic pressure could not be accurately   estimated. - Systemic veins:  Not visualized.  Patient Profile     56 y.o. female non 9 speaking  female with a history of chronic systolic heart failure followed by Dr. [name] transfered from [facility] due to abnormal echocardiogram and stress test associated with atypical sharp chest pain with SOB, PND and orthopnea with normal trop and BNP 2500.  Patient had normal cath in 2010. Last noted EF per note 35%.   Assessment & Plan    1.  Acute on chronic systolic CHF - she has a history of EF of 35% in the past and normal cath in 2010.  Echo done at [facility] showed an EF of 10% with moderate to severe MR, moderate to severe TR and possible apical thrombus. - cath showed no CAD with normal right heart pressures, mildly elevated left heart pressures with PCWP 56mmHg and mild pulmonary HTN.   - currently not on IV Lasix but pressure soft right now- BB stopped due to soft BP with SBP in 80-90mmHg.   - BP too soft to add ARB at this time - added digoxin 0.25mg  daily yesterday - PICC line placed yesterday and co ox initially 71 and then 64  - CVP this am is 4 - I&O's are incomplete.  Weight is up 1 lb today - not volume overloaded on exam today - AHF will see tomorrow in consultation to guide further therapy as BP too soft to place on standard HF meds  2.  Severe DCM with EF 10% by recent echo.  Last echo prior to this was 35% EF.  ? Etiology.  No CAD on cath.   - EF 15% on echo done yesterday - getting limited study with definity this am to rule out apical thrombus - 2D echo with EF 15% but no definity done - no obvious thrombus but will get repeat limited echo with definity to be sure - continue IV heparin gtt for now   Signed, Fransico Him, MD  04/17/2017, 7:58 AM

## 2017-04-17 NOTE — Progress Notes (Signed)
   Case discussed with Dr. Radford Pax. Labs reviewed. Co-ox and BP stable. CVP 2.  Stop diuretics. HF team will see in am.   Glori Bickers, MD  11:21 AM

## 2017-04-18 ENCOUNTER — Inpatient Hospital Stay (HOSPITAL_COMMUNITY): Payer: Self-pay

## 2017-04-18 ENCOUNTER — Encounter (HOSPITAL_COMMUNITY): Payer: Self-pay | Admitting: Internal Medicine

## 2017-04-18 DIAGNOSIS — I361 Nonrheumatic tricuspid (valve) insufficiency: Secondary | ICD-10-CM

## 2017-04-18 DIAGNOSIS — I493 Ventricular premature depolarization: Secondary | ICD-10-CM

## 2017-04-18 LAB — CBC
HEMATOCRIT: 39.1 % (ref 36.0–46.0)
Hemoglobin: 12.8 g/dL (ref 12.0–15.0)
MCH: 30 pg (ref 26.0–34.0)
MCHC: 32.7 g/dL (ref 30.0–36.0)
MCV: 91.6 fL (ref 78.0–100.0)
PLATELETS: 251 10*3/uL (ref 150–400)
RBC: 4.27 MIL/uL (ref 3.87–5.11)
RDW: 13.8 % (ref 11.5–15.5)
WBC: 8.3 10*3/uL (ref 4.0–10.5)

## 2017-04-18 LAB — ECHOCARDIOGRAM LIMITED
Ao-asc: 27 cm
CHL CUP RV SYS PRESS: 52 mmHg
FS: 7 % — AB (ref 28–44)
HEIGHTINCHES: 60 in
IVS/LV PW RATIO, ED: 0.6
LV PW d: 10.8 mm — AB (ref 0.6–1.1)
Reg peak vel: 351 cm/s
TRMAXVEL: 351 cm/s
WEIGHTICAEL: 2232 [oz_av]

## 2017-04-18 LAB — BASIC METABOLIC PANEL
Anion gap: 7 (ref 5–15)
BUN: 11 mg/dL (ref 6–20)
CALCIUM: 8.7 mg/dL — AB (ref 8.9–10.3)
CHLORIDE: 106 mmol/L (ref 101–111)
CO2: 21 mmol/L — ABNORMAL LOW (ref 22–32)
CREATININE: 0.64 mg/dL (ref 0.44–1.00)
Glucose, Bld: 93 mg/dL (ref 65–99)
Potassium: 5.6 mmol/L — ABNORMAL HIGH (ref 3.5–5.1)
SODIUM: 134 mmol/L — AB (ref 135–145)

## 2017-04-18 LAB — HEPARIN LEVEL (UNFRACTIONATED)
Heparin Unfractionated: 1.02 IU/mL — ABNORMAL HIGH (ref 0.30–0.70)
Heparin Unfractionated: 0.46 IU/mL (ref 0.30–0.70)

## 2017-04-18 LAB — MRSA PCR SCREENING: MRSA BY PCR: NEGATIVE

## 2017-04-18 LAB — COOXEMETRY PANEL
CARBOXYHEMOGLOBIN: 1.3 % (ref 0.5–1.5)
Methemoglobin: 0.7 % (ref 0.0–1.5)
O2 Saturation: 65.5 %
Total hemoglobin: 12.7 g/dL (ref 12.0–16.0)

## 2017-04-18 LAB — MAGNESIUM: MAGNESIUM: 2 mg/dL (ref 1.7–2.4)

## 2017-04-18 MED ORDER — AMIODARONE HCL 200 MG PO TABS
400.0000 mg | ORAL_TABLET | Freq: Two times a day (BID) | ORAL | Status: DC
  Administered 2017-04-18: 400 mg via ORAL
  Filled 2017-04-18: qty 2

## 2017-04-18 MED ORDER — AMIODARONE HCL IN DEXTROSE 360-4.14 MG/200ML-% IV SOLN
60.0000 mg/h | INTRAVENOUS | Status: AC
  Administered 2017-04-18 (×2): 60 mg/h via INTRAVENOUS
  Filled 2017-04-18 (×2): qty 200

## 2017-04-18 MED ORDER — AMIODARONE LOAD VIA INFUSION
150.0000 mg | Freq: Once | INTRAVENOUS | Status: AC
  Administered 2017-04-18: 150 mg via INTRAVENOUS
  Filled 2017-04-18: qty 83.34

## 2017-04-18 MED ORDER — AMIODARONE HCL IN DEXTROSE 360-4.14 MG/200ML-% IV SOLN
30.0000 mg/h | INTRAVENOUS | Status: DC
  Administered 2017-04-19 – 2017-04-21 (×5): 30 mg/h via INTRAVENOUS
  Filled 2017-04-18 (×4): qty 200

## 2017-04-18 MED ORDER — PERFLUTREN LIPID MICROSPHERE
1.0000 mL | INTRAVENOUS | Status: AC | PRN
  Administered 2017-04-18: 2 mL via INTRAVENOUS
  Filled 2017-04-18: qty 10

## 2017-04-18 NOTE — Consult Note (Signed)
Advanced Heart Failure Team Consult Note   Primary Physician: No PCP on file Primary Cardiologist:  Dr. [name]   Reason for Consultation: Acute on chronic systolic CHF  HPI:    Megan Keith is seen today for evaluation of acute on chronic systolic CHF at the request of Dr. Radford Pax.   [name] is a 56 year old female with a past medical history of chronic systolic CHF and NICM.   She presented to [facility] on 04/13/17 with neck pain, SOB and orthopnea. Last EF reported by Dr. [name] was 35%. Repeat echo showed reduced EF of 15%, she was transferred to [facility] for further evaluation. Underwent right and left heart catheterization on 04/15/17, no CAD. Fick output/index 5.28/3.34. Full results below.   She was started on digoxin and a low dose beta blocker which was stopped due to hypotension. PICC line was placed, initial co ox was 71%, CVP 4. She tells me that she was experiencing early satiety and bendopnea at home. No weight gain. She was taking all of her medications at home. She began experiencing worsening SOB which prompted her to come the hospital. Denies a family history of CHF.  Review of Systems: [y] = yes, [ ]  = no   General: Weight gain [ ] ; Weight loss [ y]; Anorexia [ ] ; Fatigue Blue.Reese ]; Fever [ ] ; Chills [ ] ; Weakness [ ]   Cardiac: Chest pain/pressure [ ] ; Resting SOB [ ] ; Exertional SOB [ ] ; Orthopnea Blue.Reese ]; Pedal Edema [ ] ; Palpitations [ ] ; Syncope [ ] ; Presyncope [ ] ; Paroxysmal nocturnal dyspnea[ ]   Pulmonary: Cough [ ] ; Wheezing[ ] ; Hemoptysis[ ] ; Sputum [ ] ; Snoring [ ]   GI: Vomiting[ ] ; Dysphagia[ ] ; Melena[ ] ; Hematochezia [ ] ; Heartburn[ ] ; Abdominal pain [ ] ; Constipation [ ] ; Diarrhea [ ] ; BRBPR [ ]   GU: Hematuria[ ] ; Dysuria [ ] ; Nocturia[ ]   Vascular: Pain in legs with walking [ ] ; Pain in feet with lying flat [ ] ; Non-healing sores [ ] ; Stroke [ ] ; TIA [ ] ; Slurred speech [ ] ;  Neuro: Headaches[ ] ; Vertigo[ ] ; Seizures[ ] ; Paresthesias[  ];Blurred vision [ ] ; Diplopia [ ] ; Vision changes [ ]   Ortho/Skin: Arthritis Blue.Reese ]; Joint pain [ ] ; Muscle pain [ ] ; Joint swelling [ ] ; Back Pain [ ] ; Rash [ ]   Psych: Depression[ ] ; Anxiety[ ]   Heme: Bleeding problems [ ] ; Clotting disorders [ ] ; Anemia [ ]   Endocrine: Diabetes [ ] ; Thyroid dysfunction[ ]   Home Medications Prior to Admission medications   Medication Sig Start Date End Date Taking? Authorizing Provider  furosemide (LASIX) 40 MG tablet Take 40 mg by mouth daily.   Yes [provider]  metoprolol tartrate (LOPRESSOR) 25 MG tablet Take 12.5 mg by mouth 2 (two) times daily.   Yes [provider]    Past Medical History: Past Medical History:  Diagnosis Date  . Anemia   . Arthritis    "knees, hands" (04/15/2017)  . Chronic knee pain   . Chronic systolic CHF (congestive heart failure) ([place])   . Pneumonia 06/2016    Past Surgical History: Past Surgical History:  Procedure Laterality Date  . BREAST CYST EXCISION Left   . TUBAL LIGATION      Family History: Family History  Problem Relation Age of Onset  . Hypertension Mother     Social History: Social History   Social History  . Marital status: Divorced    Spouse name: N/A  .  Number of children: N/A  . Years of education: N/A   Social History Main Topics  . Smoking status: Never Smoker  . Smokeless tobacco: Never Used  . Alcohol use Yes     Comment: 04/15/2017 "might have a couple beers q couple months"  . Drug use: No  . Sexual activity: No   Other Topics Concern  . None   Social History Narrative  . None    Allergies:  No Known Allergies  Objective:    Vital Signs:   Temp:  [97.6 F (36.4 C)-98.3 F (36.8 C)] 97.8 F (36.6 C) (08/26 1942) Pulse Rate:  [86-99] 87 (08/27 0200) Resp:  [16-34] 34 (08/27 0200) BP: (95-102)/(61-80) 95/75 (08/26 1942) SpO2:  [94 %-100 %] 94 % (08/27 0200) Weight:  [137 lb 8 oz (62.4 kg)] 137 lb 8 oz (62.4 kg) (08/26 0636) Last BM Date:  04/18/17  Weight change: Filed Weights   04/15/17 1410 04/16/17 0610 04/17/17 0636  Weight: 136 lb 11.2 oz (62 kg) 136 lb (61.7 kg) 137 lb 8 oz (62.4 kg)    Intake/Output:   Intake/Output Summary (Last 24 hours) at 04/18/17 0543 Last data filed at 04/18/17 0200  Gross per 24 hour  Intake            660.5 ml  Output              300 ml  Net            360.5 ml      Physical Exam   CVP 4 General:  Well appearing. No resp difficulty HEENT: normal Neck: supple. JVP 6-7 cm, TR noted. Carotids 2+ bilat; no bruits. No lymphadenopathy or thyromegaly appreciated. Cor: PMI nondisplaced. Regular rate & rhythm. No rubs, gallops or murmurs. Lungs: clear Abdomen: soft, nontender, nondistended. No hepatosplenomegaly. No bruits or masses. Good bowel sounds. Extremities: no cyanosis, clubbing, rash, edema Neuro: alert & orientedx3, cranial nerves grossly intact. moves all 4 extremities w/o difficulty. Affect pleasant   Telemetry   NSR, frequent PVC's- personally reviewed.   EKG    Sinus tach - personally reviewed  Labs   Basic Metabolic Panel:  Recent Labs Lab 04/16/17 0438  NA 136  K 3.7  CL 103  CO2 24  GLUCOSE 92  BUN 20  CREATININE 0.74  CALCIUM 9.4    Liver Function Tests: No results for input(s): AST, ALT, ALKPHOS, BILITOT, PROT, ALBUMIN in the last 168 hours. No results for input(s): LIPASE, AMYLASE in the last 168 hours. No results for input(s): AMMONIA in the last 168 hours.  CBC:  Recent Labs Lab 04/16/17 0438 04/17/17 0422 04/18/17 0430  WBC 8.4 10.6* 8.3  HGB 14.4 14.0 12.8  HCT 42.2 40.2 39.1  MCV 88.8 90.1 91.6  PLT 270 217 251    Cardiac Enzymes: No results for input(s): CKTOTAL, CKMB, CKMBINDEX, TROPONINI in the last 168 hours.  BNP: BNP (last 3 results)  Recent Labs  04/15/17 1631  BNP 444.4*    ProBNP (last 3 results) No results for input(s): PROBNP in the last 8760 hours.   CBG: No results for input(s): GLUCAP in the last  168 hours.  Coagulation Studies: No results for input(s): LABPROT, INR in the last 72 hours.   Imaging   Transthoracic Echocardiography 04/16/17 Study Conclusions  - Left ventricle: The cavity size was mildly dilated. Systolic   function was severely reduced. The estimated ejection fraction   was 15-20%. Wall motion was normal; there were no regional  wall   motion abnormalities. The study is not technically sufficient to   allow evaluation of LV diastolic function. - Mitral valve: There was mild to moderate regurgitation directed   centrally. - Left atrium: The atrium was moderately dilated.   Anterior-posterior dimension: 46 mm. - Right ventricle: Systolic pressure was increased. The estimated   peak pressure was 42 mm Hg. - Tricuspid valve: There was moderate regurgitation. Peak RV-RA   gradient (S): 42 mm Hg. - Pulmonic valve: There was trivial regurgitation. - Pulmonary arteries: Systolic pressure could not be accurately   estimated. - Systemic veins: Not visualized.    Medications:     Current Medications: . aspirin EC  81 mg Oral Daily  . atorvastatin  80 mg Oral q1800  . digoxin  0.25 mg Oral Daily  . sodium chloride flush  10-40 mL Intracatheter Q12H  . sodium chloride flush  3 mL Intravenous Q12H     Infusions: . sodium chloride    . heparin 950 Units/hr (04/17/17 1824)       Patient Profile   [name] is a 56 year old female with a past medical history of chronic systolic CHF and NICM.   Assessment/Plan   1. Acute on chronic systolic CHF: Echo EF 28%, moderate TR, moderate MR. NICM, normal cors by cath 03/2017 - NYHA III, co ox 65%. No need for inotropes.  - Volume stable on exam, CVP 4.  - She has about 20-34 PVC's per hour by tele review. Would add Amio to suppress PVC's. - 400 mg BID.  - No beta blocker with hypotension.  - Continue digoxin 0.25 mg - Add losartan 12.5 mg hs - Consult cardiac rehab today.   2. NICM  3. PVC induced  cardiomyopathy  4. Questionable apical thrombus - Continue heparin gtt - For limited Echo today to reassess.    Length of Stay: [place], NP  04/18/2017, 5:43 AM  Advanced Heart Failure Team Pager [phone] (M-F; 7a - 4p)  Please contact [facility] for night-coverage after hours (4p -7a ) and weekends on [email or web address]  Patient seen and examined with Jettie Booze, NP. We discussed all aspects of the encounter. I agree with the assessment and plan as stated above.   I have reviewed echo and cath films personally. I have also discussed the case with her extensively through the interpreter line. 56 y/o woman with severe NICM. EF 20%.   I agree that she likely has PVC induced-CM. Tele shows about 30% PVC burden. Currently CVP and co-ox look good. BP soft. Will start amiodarone to suppress PVCs. May eventually need PVC ablation. No evidence of LV thrombus on Definity imaging.   Glori Bickers, MD  7:11 PM

## 2017-04-18 NOTE — Progress Notes (Signed)
ANTICOAGULATION CONSULT NOTE - Follow Up Consult  Pharmacy Consult for Heparin Indication: possible apical thrombus  No Known Allergies  Patient Measurements: Height: 5' (152.4 cm) Weight: 139 lb 8 oz (63.3 kg) IBW/kg (Calculated) : 45.5 Heparin Dosing Weight: 58 kg  Vital Signs: Temp: 97.8 F (36.6 C) (08/27 0700) Temp Source: Oral (08/27 0700) BP: 92/59 (08/27 0700) Pulse Rate: 97 (08/27 0618)  Labs:  Recent Labs  04/16/17 0438  04/17/17 0422 04/18/17 0430 04/18/17 0550  HGB 14.4  --  14.0 12.8  --   HCT 42.2  --  40.2 39.1  --   PLT 270  --  217 251  --   HEPARINUNFRC 0.35  < > 0.39 1.02* 0.46  CREATININE 0.74  --   --   --   --   < > = values in this interval not displayed.  Estimated Creatinine Clearance: 65.2 mL/min (by C-G formula based on SCr of 0.74 mg/dL).   Medications:  Heparin @ 950 units/hr  Assessment: 56yof continues on heparin for possible apical thrombus seen on ECHO at [place]. Limited ECHO done today to reassess but results pending. Heparin level is therapeutic at 0.46. CBC stable.  Goal of Therapy:  Heparin level 0.3-0.7 units/ml Monitor platelets by anticoagulation protocol: Yes   Plan:  1) Continue heparin at 950 units/hr 2) Daily heparin level, CBC  Nena Jordan, PharmD, BCPS 04/18/2017,11:13 AM

## 2017-04-18 NOTE — Progress Notes (Signed)
  Echocardiogram 2D Echocardiogram has been performed.  Megan Keith 04/18/2017, 9:07 AM

## 2017-04-19 LAB — CBC
HEMATOCRIT: 39.2 % (ref 36.0–46.0)
HEMOGLOBIN: 12.6 g/dL (ref 12.0–15.0)
MCH: 29.6 pg (ref 26.0–34.0)
MCHC: 32.1 g/dL (ref 30.0–36.0)
MCV: 92 fL (ref 78.0–100.0)
Platelets: 242 10*3/uL (ref 150–400)
RBC: 4.26 MIL/uL (ref 3.87–5.11)
RDW: 14 % (ref 11.5–15.5)
WBC: 9.2 10*3/uL (ref 4.0–10.5)

## 2017-04-19 LAB — BASIC METABOLIC PANEL
Anion gap: 7 (ref 5–15)
BUN: 10 mg/dL (ref 6–20)
CALCIUM: 8.9 mg/dL (ref 8.9–10.3)
CHLORIDE: 106 mmol/L (ref 101–111)
CO2: 24 mmol/L (ref 22–32)
CREATININE: 0.63 mg/dL (ref 0.44–1.00)
GFR calc non Af Amer: >60 mL/min (ref >60)
Glucose, Bld: 105 mg/dL — ABNORMAL HIGH (ref 65–99)
Potassium: 3.9 mmol/L (ref 3.5–5.1)
SODIUM: 137 mmol/L (ref 135–145)

## 2017-04-19 LAB — COOXEMETRY PANEL
CARBOXYHEMOGLOBIN: 0.8 % (ref 0.5–1.5)
Methemoglobin: 0.8 % (ref 0.0–1.5)
O2 SAT: 75.9 %
TOTAL HEMOGLOBIN: 13 g/dL (ref 12.0–16.0)

## 2017-04-19 LAB — HEPARIN LEVEL (UNFRACTIONATED): Heparin Unfractionated: 0.51 IU/mL (ref 0.30–0.70)

## 2017-04-19 MED ORDER — SPIRONOLACTONE 25 MG PO TABS
12.5000 mg | ORAL_TABLET | Freq: Every day | ORAL | Status: DC
Start: 2017-04-19 — End: 2017-04-21
  Administered 2017-04-19 – 2017-04-21 (×3): 12.5 mg via ORAL
  Filled 2017-04-19 (×3): qty 1

## 2017-04-19 MED ORDER — DIGOXIN 125 MCG PO TABS
0.1250 mg | ORAL_TABLET | Freq: Every day | ORAL | Status: DC
Start: 2017-04-19 — End: 2017-04-21
  Administered 2017-04-19 – 2017-04-21 (×3): 0.125 mg via ORAL
  Filled 2017-04-19 (×3): qty 1

## 2017-04-19 MED ORDER — ENOXAPARIN SODIUM 40 MG/0.4ML ~~LOC~~ SOLN
40.0000 mg | SUBCUTANEOUS | Status: DC
  Administered 2017-04-19 – 2017-04-21 (×3): 40 mg via SUBCUTANEOUS
  Filled 2017-04-19 (×3): qty 0.4

## 2017-04-19 NOTE — Progress Notes (Signed)
Patient has complaint of tenderness to right arm with PICC. Spoke with patient via Marketing executive.Instructed patient to move arm freely to help with blood flow. Will re-evaluate.

## 2017-04-19 NOTE — Progress Notes (Signed)
Advanced Heart Failure Rounding Note  PCP: No PCP on file  Primary Cardiologist: Dr. Munley/Dr. Amogh Komatsu   Subjective:    Started on Amio gtt yesterday. Co ox 76%. BP remains soft. Feels well, denies SOB and orthopnea.     Objective:   Weight Range: 139 lb 12.8 oz (63.4 kg) Body mass index is 27.3 kg/m.   Vital Signs:   Temp:  [97.7 F (36.5 C)-98.2 F (36.8 C)] 98 F (36.7 C) (08/28 0523) Pulse Rate:  [79-96] 79 (08/28 0523) Resp:  [14-22] 22 (08/28 0523) BP: (92-98)/(68-73) 92/73 (08/28 0523) SpO2:  [99 %-100 %] 100 % (08/28 0523) Weight:  [139 lb 12.8 oz (63.4 kg)] 139 lb 12.8 oz (63.4 kg) (08/28 0523) Last BM Date: 04/18/17  Weight change: Filed Weights   04/17/17 0636 04/18/17 0618 04/19/17 0523  Weight: 137 lb 8 oz (62.4 kg) 139 lb 8 oz (63.3 kg) 139 lb 12.8 oz (63.4 kg)    Intake/Output:   Intake/Output Summary (Last 24 hours) at 04/19/17 0726 Last data filed at 04/19/17 0700  Gross per 24 hour  Intake           1630.3 ml  Output              991 ml  Net            639.3 ml      Physical Exam    General:  Well appearing. No resp difficulty HEENT: Normal Neck: Supple. JVP 5-6 cm. Carotids 2+ bilat; no bruits. No lymphadenopathy or thyromegaly appreciated. Cor: PMI nondisplaced. Regular rate & rhythm, frequent PVC's. No rubs, gallops or murmurs. Lungs: Clear Abdomen: Soft, nontender, nondistended. No hepatosplenomegaly. No bruits or masses. Good bowel sounds. Extremities: No cyanosis, clubbing, rash, edema Neuro: Alert & orientedx3, cranial nerves grossly intact. moves all 4 extremities w/o difficulty. Affect pleasant   Telemetry   NSR, frequent PVC's - personally reviewed.   EKG    NSR - personally reviewed.   Labs    CBC  Recent Labs  04/18/17 0430 04/19/17 0432  WBC 8.3 9.2  HGB 12.8 12.6  HCT 39.1 39.2  MCV 91.6 92.0  PLT 251 161   Basic Metabolic Panel  Recent Labs  04/18/17 1806 04/19/17 0432  NA 134* 137  K 5.6*  3.9  CL 106 106  CO2 21* 24  GLUCOSE 93 105*  BUN 11 10  CREATININE 0.64 0.63  CALCIUM 8.7* 8.9  MG 2.0  --     BNP: BNP (last 3 results)  Recent Labs  04/15/17 1631  BNP 444.4*       Imaging   Transthoracic Echocardiography 04/18/17 Study Conclusions  - Left ventricle: The cavity size was moderately dilated. Systolic   function was normal. The estimated ejection fraction was in the   range of 10% to 15%. Wall motion was normal; there were no   regional wall motion abnormalities. Doppler parameters are   consistent with restrictive physiology, indicative of decreased   left ventricular diastolic compliance and/or increased left   atrial pressure. Doppler parameters are consistent with elevated   ventricular end-diastolic filling pressure. - Aortic valve: Trileaflet; normal thickness leaflets. There was no   regurgitation. - Aortic root: The aortic root was normal in size. - Mitral valve: There was mild regurgitation. - Right ventricle: The cavity size was moderately dilated. Wall   thickness was normal. Systolic function was moderately reduced. - Right atrium: The atrium was normal in size. - Tricuspid  valve: There was moderate regurgitation. - Pulmonic valve: Transvalvular velocity was within the normal   range. There was no evidence for stenosis. - Pulmonary arteries: Systolic pressure was moderately increased.   PA peak pressure: 52 mm Hg (S). - Inferior vena cava: The vessel was normal in size. - Pericardium, extracardiac: There was no pericardial effusion.  Impressions:  - No significant change since the last study on 04/16/2017. No   thrombus in the left ventricle on the Definity images.     Medications:     Scheduled Medications: . aspirin EC  81 mg Oral Daily  . atorvastatin  80 mg Oral q1800  . digoxin  0.25 mg Oral Daily  . sodium chloride flush  10-40 mL Intracatheter Q12H  . sodium chloride flush  3 mL Intravenous Q12H      Infusions: . sodium chloride    . amiodarone 30 mg/hr (04/19/17 0100)  . heparin 950 Units/hr (04/19/17 0100)     PRN Medications:  sodium chloride, acetaminophen, nitroGLYCERIN, ondansetron (ZOFRAN) IV, sodium chloride flush, sodium chloride flush    Patient Profile   Ms. [name] is a 56 year old female with a past medical history of chronic systolic CHF (EF previously 35%, now down to 15-20%). Admitted with SOB, now on Amio for >25% PVC's.   Assessment/Plan   1. Acute on chronic systolic CHF: Echo EF 08%, moderate TR, moderate MR. NICM, normal cors by cath 03/2017. 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  - NYHA II-III - Co ox stable at 76%.  - Volume stable on exam. CVP  - No beta blocker with hypotension.  - Continue digoxin 0.25 mg - BP too soft for losartan.   2. NICM  3. PVC induced cardiomyopathy - Still with 20-30 PVC's a minute. Continue Amio gtt.   4. Questionable apical thrombus - Can stop heparin. No apical thrombus    Length of Stay: [place], NP  04/19/2017, 7:26 AM  Advanced Heart Failure Team Pager [phone] (M-F; 7a - 4p)  Please contact [facility] for night-coverage after hours (4p -7a ) and weekends on [email or web address]  Patient seen and examined with Jettie Booze, NP. We discussed all aspects of the encounter. I agree with the assessment and plan as stated above.   Remains on amio. PVC burden still high. Volume status and co-ox look good. BP remains very soft. Keep K and Mg supplemented. Will add spiro 12.5. Decrease dig to 0.125.  Glori Bickers, MD  8:38 AM

## 2017-04-19 NOTE — Progress Notes (Signed)
CARDIAC REHAB PHASE I   PRE:  Rate/Rhythm: 90 SR with PVCs    BP: sitting 90/67    SaO2: 100 RA  MODE:  Ambulation: 1000 ft   POST:  Rate/Rhythm: 108 with PVCs    BP: sitting 105/80     SaO2: 100 RA  Tolerated well, no c/o. Steady. She has been watching her diet/sodium since November has lost 30 lbs. She weighs daily. We will bring Spanish ed materials tomorrow for reiteration. [place], ACSM 04/19/2017 2:30 PM

## 2017-04-20 LAB — CBC
HCT: 37.2 % (ref 36.0–46.0)
HEMOGLOBIN: 12 g/dL (ref 12.0–15.0)
MCH: 29.9 pg (ref 26.0–34.0)
MCHC: 32.3 g/dL (ref 30.0–36.0)
MCV: 92.8 fL (ref 78.0–100.0)
PLATELETS: 234 10*3/uL (ref 150–400)
RBC: 4.01 MIL/uL (ref 3.87–5.11)
RDW: 14.1 % (ref 11.5–15.5)
WBC: 7.2 10*3/uL (ref 4.0–10.5)

## 2017-04-20 LAB — COOXEMETRY PANEL
Carboxyhemoglobin: 0.8 % (ref 0.5–1.5)
Methemoglobin: 0.9 % (ref 0.0–1.5)
O2 SAT: 61.5 %
TOTAL HEMOGLOBIN: 12.2 g/dL (ref 12.0–16.0)

## 2017-04-20 LAB — BASIC METABOLIC PANEL
ANION GAP: 9 (ref 5–15)
BUN: 20 mg/dL (ref 6–20)
CALCIUM: 9.4 mg/dL (ref 8.9–10.3)
CO2: 29 mmol/L (ref 22–32)
CREATININE: 1.31 mg/dL — AB (ref 0.44–1.00)
Chloride: 101 mmol/L (ref 101–111)
GFR calc non Af Amer: 45 mL/min — ABNORMAL LOW (ref >60)
GFR, EST AFRICAN AMERICAN: 52 mL/min — AB (ref >60)
Glucose, Bld: 95 mg/dL (ref 65–99)
Potassium: 3.8 mmol/L (ref 3.5–5.1)
SODIUM: 139 mmol/L (ref 135–145)

## 2017-04-20 LAB — MAGNESIUM: Magnesium: 2.1 mg/dL (ref 1.7–2.4)

## 2017-04-20 MED ORDER — POTASSIUM CHLORIDE CRYS ER 20 MEQ PO TBCR
40.0000 meq | EXTENDED_RELEASE_TABLET | Freq: Once | ORAL | Status: AC
  Administered 2017-04-20: 40 meq via ORAL
  Filled 2017-04-20: qty 2

## 2017-04-20 MED ORDER — AMIODARONE IV BOLUS ONLY 150 MG/100ML
150.0000 mg | Freq: Once | INTRAVENOUS | Status: DC
  Filled 2017-04-20: qty 100

## 2017-04-20 MED ORDER — AMIODARONE LOAD VIA INFUSION
150.0000 mg | Freq: Once | INTRAVENOUS | Status: AC
  Administered 2017-04-20: 150 mg via INTRAVENOUS
  Filled 2017-04-20: qty 83.34

## 2017-04-20 NOTE — Progress Notes (Signed)
CARDIAC REHAB PHASE I   PRE:  Rate/Rhythm: 95 SR with occasional PVC    BP: sitting 90/74    SaO2: 99 RA  MODE:  Ambulation: 1650 ft   POST:  Rate/Rhythm: 111 ST with one PVC noted    BP: sitting 96/79     SaO2: 100 RA  Pt ambulated without c/o. Gave her HF book and low sodium diets in Spanish for reinforcement. We discussed 2000 mg diet. Eager to walk more.  [place], ACSM 04/20/2017 2:08 PM

## 2017-04-20 NOTE — Plan of Care (Signed)
Problem: Education: Goal: Knowledge of [facility] Education information/materials will improve Outcome: Progressing Patient with uneventful evening. NSR on tele 70s, rare PVCs. [place]. Amioderone infusing. Electronic translator utilized to communicate with patient. CVP 6.

## 2017-04-20 NOTE — Progress Notes (Signed)
Advanced Heart Failure Rounding Note  PCP: No PCP on file  Primary Cardiologist: Dr. Munley/Dr. Norton Bivins   Subjective:    Remains on Amio gtt. Continues to have >20 PVC's a minute. Co ox 62%.   Objective:   Weight Range: 141 lb 5 oz (64.1 kg) Body mass index is 27.6 kg/m.   Vital Signs:   Temp:  [97.9 F (36.6 C)-98.5 F (36.9 C)] 98.2 F (36.8 C) (08/29 0500) Pulse Rate:  [76-100] 93 (08/29 0500) Resp:  [16-21] 16 (08/29 0500) BP: (90-113)/(67-81) 90/68 (08/29 0500) SpO2:  [100 %] 100 % (08/29 0500) Weight:  [141 lb 5 oz (64.1 kg)] 141 lb 5 oz (64.1 kg) (08/29 0500) Last BM Date: 04/18/17  Weight change: Filed Weights   04/18/17 0618 04/19/17 0523 04/20/17 0500  Weight: 139 lb 8 oz (63.3 kg) 139 lb 12.8 oz (63.4 kg) 141 lb 5 oz (64.1 kg)    Intake/Output:   Intake/Output Summary (Last 24 hours) at 04/20/17 0812 Last data filed at 04/19/17 1915  Gross per 24 hour  Intake           924.58 ml  Output                0 ml  Net           924.58 ml      Physical Exam    General: Well appearing. No resp difficulty. HEENT: Normal Neck: Supple. JVP 5-6. Carotids 2+ bilat; no bruits. No thyromegaly or nodule noted. Cor: PMI nondisplaced. Frequent PVC's, rate normal, No M/G/R noted Lungs: CTAB, normal effort. Abdomen: Soft, non-tender, non-distended, no HSM. No bruits or masses. +BS  Extremities: No cyanosis, clubbing, rash, R and LLE no edema.  Neuro: Alert & orientedx3, cranial nerves grossly intact. moves all 4 extremities w/o difficulty. Affect pleasant    Telemetry   NSR, frequent PVC's - personally reviewed.   EKG    NSR - personally reviewed.   Labs    CBC  Recent Labs  04/19/17 0432 04/20/17 0516  WBC 9.2 7.2  HGB 12.6 12.0  HCT 39.2 37.2  MCV 92.0 92.8  PLT 242 284   Basic Metabolic Panel  Recent Labs  04/18/17 1806 04/19/17 0432 04/20/17 0516  NA 134* 137 139  K 5.6* 3.9 3.8  CL 106 106 101  CO2 21* 24 29  GLUCOSE 93  105* 95  BUN 11 10 20   CREATININE 0.64 0.63 1.31*  CALCIUM 8.7* 8.9 9.4  MG 2.0  --  2.1    BNP: BNP (last 3 results)  Recent Labs  04/15/17 1631  BNP 444.4*       Imaging   Transthoracic Echocardiography 04/18/17 Study Conclusions  - Left ventricle: The cavity size was moderately dilated. Systolic   function was normal. The estimated ejection fraction was in the   range of 10% to 15%. Wall motion was normal; there were no   regional wall motion abnormalities. Doppler parameters are   consistent with restrictive physiology, indicative of decreased   left ventricular diastolic compliance and/or increased left   atrial pressure. Doppler parameters are consistent with elevated   ventricular end-diastolic filling pressure. - Aortic valve: Trileaflet; normal thickness leaflets. There was no   regurgitation. - Aortic root: The aortic root was normal in size. - Mitral valve: There was mild regurgitation. - Right ventricle: The cavity size was moderately dilated. Wall   thickness was normal. Systolic function was moderately reduced. - Right atrium: The  atrium was normal in size. - Tricuspid valve: There was moderate regurgitation. - Pulmonic valve: Transvalvular velocity was within the normal   range. There was no evidence for stenosis. - Pulmonary arteries: Systolic pressure was moderately increased.   PA peak pressure: 52 mm Hg (S). - Inferior vena cava: The vessel was normal in size. - Pericardium, extracardiac: There was no pericardial effusion.  Impressions:  - No significant change since the last study on 04/16/2017. No   thrombus in the left ventricle on the Definity images.     Medications:     Scheduled Medications: . aspirin EC  81 mg Oral Daily  . atorvastatin  80 mg Oral q1800  . digoxin  0.125 mg Oral Daily  . enoxaparin (LOVENOX) injection  40 mg Subcutaneous Q24H  . sodium chloride flush  10-40 mL Intracatheter Q12H  . sodium chloride flush  3  mL Intravenous Q12H  . spironolactone  12.5 mg Oral Daily    Infusions: . sodium chloride    . amiodarone 30 mg/hr (04/20/17 0750)    PRN Medications: sodium chloride, acetaminophen, nitroGLYCERIN, ondansetron (ZOFRAN) IV, sodium chloride flush, sodium chloride flush    Patient Profile   [name] is a 56 year old female with a past medical history of chronic systolic CHF (EF previously 35%, now down to 15-20%). Admitted with SOB, now on Amio for >25% PVC's.   Assessment/Plan   1. Acute on chronic systolic CHF: Echo EF 03%, moderate TR, moderate MR. NICM, normal cors by cath 03/2017. 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  - NYHA II - Co ox 65%.  - Volume stable on exam, CVP 5 - No beta blocker with hypotension - Continue digoxin 0.25 mg - BP too soft for losartan.  [name] added yesterday. Creatinine 0.63->1.31.   2. NICM  3. PVC induced cardiomyopathy - Continue Amio gtt. May need to re-bolus.  - Consider EP consult.   4. Questionable apical thrombus - Heparin stopped, no thrombus.    Length of Stay: [place], NP  04/20/2017, 8:12 AM  Advanced Heart Failure Team Pager [phone] (M-F; 7a - 4p)  Please contact [facility] for night-coverage after hours (4p -7a ) and weekends on [email or web address]  Patient seen and examined with Megan Booze, NP. We discussed all aspects of the encounter. I agree with the assessment and plan as stated above.   Remains on amio. PVC burden still high. Volume status and co-ox look good. BP remains very soft. Keep K and Mg supplemented. Will add spiro 12.5. Decrease dig to 0.125.  Megan Leas, NP  8:12 AM  Patient seen and examined with Megan Booze, NP. We discussed all aspects of the encounter. I agree with the assessment and plan as stated above.   Fairly well compensated on exam but PVC burden remains high. Will continue to load amio. Give extra bolus today. Keep K > 4.0 Mg > 2.0. If PVC burden remains high will ask EP to see. If is coming  down can likely go home tomorrow.   Glori Bickers, MD  9:13 AM

## 2017-04-21 DIAGNOSIS — I493 Ventricular premature depolarization: Secondary | ICD-10-CM

## 2017-04-21 LAB — CBC
HCT: 36.9 % (ref 36.0–46.0)
Hemoglobin: 11.9 g/dL — ABNORMAL LOW (ref 12.0–15.0)
MCH: 29.8 pg (ref 26.0–34.0)
MCHC: 32.2 g/dL (ref 30.0–36.0)
MCV: 92.5 fL (ref 78.0–100.0)
PLATELETS: 235 10*3/uL (ref 150–400)
RBC: 3.99 MIL/uL (ref 3.87–5.11)
RDW: 14 % (ref 11.5–15.5)
WBC: 8.8 10*3/uL (ref 4.0–10.5)

## 2017-04-21 LAB — BASIC METABOLIC PANEL
Anion gap: 8 (ref 5–15)
BUN: 8 mg/dL (ref 6–20)
CO2: 24 mmol/L (ref 22–32)
CREATININE: 0.63 mg/dL (ref 0.44–1.00)
Calcium: 9 mg/dL (ref 8.9–10.3)
Chloride: 105 mmol/L (ref 101–111)
Glucose, Bld: 169 mg/dL — ABNORMAL HIGH (ref 65–99)
POTASSIUM: 3.9 mmol/L (ref 3.5–5.1)
SODIUM: 137 mmol/L (ref 135–145)

## 2017-04-21 LAB — DIGOXIN LEVEL: DIGOXIN LVL: 0.7 ng/mL — AB (ref 0.8–2.0)

## 2017-04-21 LAB — COOXEMETRY PANEL
Carboxyhemoglobin: 0.8 % (ref 0.5–1.5)
Methemoglobin: 0.7 % (ref 0.0–1.5)
O2 Saturation: 74.2 %
Total hemoglobin: 12.3 g/dL (ref 12.0–16.0)

## 2017-04-21 MED ORDER — DIGOXIN 125 MCG PO TABS: ORAL_TABLET | ORAL | 6 refills | Status: DC

## 2017-04-21 MED ORDER — ACETAMINOPHEN 325 MG PO TABS: 650.0000 mg | ORAL_TABLET | ORAL | Status: DC | PRN

## 2017-04-21 MED ORDER — AMIODARONE HCL 200 MG PO TABS: ORAL_TABLET | ORAL | 1 refills | Status: DC

## 2017-04-21 MED ORDER — SPIRONOLACTONE 25 MG PO TABS: ORAL_TABLET | ORAL | 6 refills | Status: DC

## 2017-04-21 MED ORDER — AMIODARONE HCL 200 MG PO TABS
400.0000 mg | ORAL_TABLET | Freq: Two times a day (BID) | ORAL | Status: DC
  Administered 2017-04-21: 400 mg via ORAL
  Filled 2017-04-21: qty 2

## 2017-04-21 NOTE — Progress Notes (Signed)
Discussed low sodium and daily wts with pt through interpreter. She has read the booklets for the most part and is understanding fairly well. Answered all questions except she is wondering when she can go back to work (works in [employer]	[place] with [employer], gets covered in dust).  [place], ACSM 2:06 PM 04/21/2017

## 2017-04-21 NOTE — Progress Notes (Signed)
Advanced Heart Failure Rounding Note  PCP: No PCP on file  Primary Cardiologist: Dr. Munley/Dr. Keenya Matera   Subjective:    PVC burden reduced. Feeling better, more energy. CVP 9  Objective:   Weight Range: 143 lb 4.8 oz (65 kg) Body mass index is 27.99 kg/m.   Vital Signs:   Temp:  [97.4 F (36.3 C)-98.2 F (36.8 C)] 98.2 F (36.8 C) (08/30 0327) Pulse Rate:  [87-96] 87 (08/30 0002) Resp:  [16-21] 16 (08/30 0327) BP: (89-101)/(66-72) 93/72 (08/30 0327) SpO2:  [100 %] 100 % (08/30 0327) Weight:  [143 lb 4.8 oz (65 kg)] 143 lb 4.8 oz (65 kg) (08/30 0500) Last BM Date: 04/19/17  Weight change: Filed Weights   04/19/17 0523 04/20/17 0500 04/21/17 0500  Weight: 139 lb 12.8 oz (63.4 kg) 141 lb 5 oz (64.1 kg) 143 lb 4.8 oz (65 kg)    Intake/Output:   Intake/Output Summary (Last 24 hours) at 04/21/17 0612 Last data filed at 04/21/17 0500  Gross per 24 hour  Intake             2280 ml  Output                1 ml  Net             2279 ml      Physical Exam    CVP 9  General: Well appearing. No resp difficulty. HEENT: Normal Neck: Supple. JVP 5-6. Carotids 2+ bilat; no bruits. No thyromegaly or nodule noted. Cor: PMI nondisplaced. RRR, No M/G/R noted Lungs: CTAB, normal effort. Abdomen: Soft, non-tender, non-distended, no HSM. No bruits or masses. +BS  Extremities: No cyanosis, clubbing, rash, R and LLE no edema. RUE PICC Neuro: Alert & orientedx3, cranial nerves grossly intact. moves all 4 extremities w/o difficulty. Affect pleasant   Telemetry   NSR, PVC's -  Personally reviewed.   EKG    NSR - personally reviewed.   Labs    CBC  Recent Labs  04/20/17 0516 04/21/17 0308  WBC 7.2 8.8  HGB 12.0 11.9*  HCT 37.2 36.9  MCV 92.8 92.5  PLT 234 956   Basic Metabolic Panel  Recent Labs  04/18/17 1806  04/20/17 0516 04/21/17 0308  NA 134*  < > 139 137  K 5.6*  < > 3.8 3.9  CL 106  < > 101 105  CO2 21*  < > 29 24  GLUCOSE 93  < > 95 169*    BUN 11  < > 20 8  CREATININE 0.64  < > 1.31* 0.63  CALCIUM 8.7*  < > 9.4 9.0  MG 2.0  --  2.1  --   < > = values in this interval not displayed.  BNP: BNP (last 3 results)  Recent Labs  04/15/17 1631  BNP 444.4*       Imaging   Transthoracic Echocardiography 04/18/17 Study Conclusions  - Left ventricle: The cavity size was moderately dilated. Systolic   function was normal. The estimated ejection fraction was in the   range of 10% to 15%. Wall motion was normal; there were no   regional wall motion abnormalities. Doppler parameters are   consistent with restrictive physiology, indicative of decreased   left ventricular diastolic compliance and/or increased left   atrial pressure. Doppler parameters are consistent with elevated   ventricular end-diastolic filling pressure. - Aortic valve: Trileaflet; normal thickness leaflets. There was no   regurgitation. - Aortic root: The aortic root  was normal in size. - Mitral valve: There was mild regurgitation. - Right ventricle: The cavity size was moderately dilated. Wall   thickness was normal. Systolic function was moderately reduced. - Right atrium: The atrium was normal in size. - Tricuspid valve: There was moderate regurgitation. - Pulmonic valve: Transvalvular velocity was within the normal   range. There was no evidence for stenosis. - Pulmonary arteries: Systolic pressure was moderately increased.   PA peak pressure: 52 mm Hg (S). - Inferior vena cava: The vessel was normal in size. - Pericardium, extracardiac: There was no pericardial effusion.  Impressions:  - No significant change since the last study on 04/16/2017. No   thrombus in the left ventricle on the Definity images.     Medications:     Scheduled Medications: . aspirin EC  81 mg Oral Daily  . atorvastatin  80 mg Oral q1800  . digoxin  0.125 mg Oral Daily  . enoxaparin (LOVENOX) injection  40 mg Subcutaneous Q24H  . sodium chloride flush   10-40 mL Intracatheter Q12H  . sodium chloride flush  3 mL Intravenous Q12H  . spironolactone  12.5 mg Oral Daily    Infusions: . sodium chloride    . amiodarone 30 mg/hr (04/21/17 0302)    PRN Medications: sodium chloride, acetaminophen, nitroGLYCERIN, ondansetron (ZOFRAN) IV, sodium chloride flush, sodium chloride flush    Patient Profile   Ms. [name] is a 56 year old female with a past medical history of chronic systolic CHF (EF previously 35%, now down to 15-20%). Admitted with SOB, now on Amio for >25% PVC's.   Assessment/Plan   1. Acute on chronic systolic CHF: Echo EF 29%, moderate TR, moderate MR. NICM, normal cors by cath 03/2017. 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  - NYHA II - Co ox 74%. - Continue Digoxin 0.125 mg - Continue Spiro 12.5 mg daily.  - BP too low for addition of any other meds.  - Walked with cardiac rehab.  2. NICM  3. PVC induced cardiomyopathy - Transition to po Amio. 400 mg BID x 7 days, then 200 mg BID. We will see her in the clinic in 10-14 days.   4. Questionable apical thrombus - Heparin stopped, no thrombus.   Can remove PICC line. Follow up made in the CHF clinic.   Length of Stay: [place], NP  04/21/2017, 6:12 AM  Advanced Heart Failure Team Pager [phone] (M-F; 7a - 4p)  Please contact [facility] for night-coverage after hours (4p -7a ) and weekends on [email or web address]   Patient seen and examined with Jettie Booze, NP. We discussed all aspects of the encounter. I agree with the assessment and plan as stated above.   PVC burden markedly decreased. Suspect EF will improved with suppression of PVCs. Co-ox and volume status are fine. Can pull PICC.   Home today on spiro 12.5, digoxin 0.125 daily, amiodarone. Can stop ASA and statin. F/u HF clinic soon.  Glori Bickers, MD  9:56 AM

## 2017-04-21 NOTE — Care Management Note (Addendum)
Case Management Note  Patient Details  Name: Megan Keith MRN: [id number] Date of Birth: [birth year]  Subjective/Objective:    Pt presented as a transfer from [facility] 2/2 abnormal stress test. Post cardiac cath 04-15-17 revealed increased heart pressures. Initially on IV Lasix and d/c. Pt with PVC Cardiomyopathy- continued on IV Amio gtt. Pt is without insurance at this time. Pt uses [facility] in [facility] follow up scheduled and placed on the AVS. Plan will be for home 04-21-17.    Action/Plan: Interpreting Services Assisted when discussing with patient. [facility] [address] This [facility] does not participate in the [place]. Pt will need to take the Rx's to one of the Pharmacies listed that participates with the [place]. [facility].   Expected Discharge Date:                  Expected Discharge Plan:  Home/Self Care  In-House Referral:  Interpreting Services  Discharge planning Services  CM Consult, Follow-up appt scheduled, [facility], Medication Assistance, MATCH  Post Acute Care Choice:  NA Choice offered to:  NA  DME Arranged:  N/A DME Agency:  NA  HH Arranged:  NA HH Agency:  NA  Status of Service:  Completed, signed off  If discussed at [place] of Stay Meetings, dates discussed:  04-21-17   Additional Comments: 1414 04-21-17 Jacqlyn Krauss, RN,BSN [phone] CM to [facility] the patient for medication assistance before she leaves. Pt will have transportation home.    1256 04-21-17 Jacqlyn Krauss, RN,BSN [phone] CM did reach out to Interpreter in regards to see if can assist with d/c planning. CM wants to make sure pt can afford medications once stable for d/c.  Adelfa Koh Ocie Cornfield, RN 04/21/2017, 10:58 AM

## 2017-04-21 NOTE — Discharge Summary (Signed)
Advanced Heart Failure Discharge Note  Discharge Summary   Patient ID: Megan Keith MRN: [id number], DOB/AGE: [birth year] 56 y.o. Admit date: 04/15/2017 D/C date:     04/21/2017   Primary Discharge Diagnoses:  1. Acute on chronic systolic CHF: Echo 19%. NICM 2. NICM 3. PVC induced cardiomyopathy  Hospital Course:   Megan Keith is a 56 y.o. female with past medical history of CHF and NICM.   Presented to [facility] 04/13/17 with neck pain, SOB, and orthopnea.  Echo showed EF 15% so transferred to [facility] for further evaluation. Pt underwent R/LHC as below that showed no CAD and compensating hemodynamics.   Pt was intolerant to BB with hypotension. Tolerated digoxin.  Frequent PVCs noted on tele (upwards of 30%), so patient started on amiodarone with improvement.  Tolerated low dose spiro and digoxin, but pressures remained too soft for any further. PICC line placed this admission to follow CVP and Coox which remained stable throughout admission.   Pt examined am of 04/21/17 and determined to be stable with discharge with decreased PVC burden. She will be discharged to home today in stable condition with close follow up as below.   Discharge Weight Range: 143 lbs Discharge Vitals: Blood pressure 93/72, pulse 93, temperature 98.2 F (36.8 C), temperature source Oral, resp. rate 18, height 5' (1.524 m), weight 143 lb 4.8 oz (65 kg), SpO2 99 %.  Labs: Lab Results  Component Value Date   WBC 8.8 04/21/2017   HGB 11.9 (L) 04/21/2017   HCT 36.9 04/21/2017   MCV 92.5 04/21/2017   PLT 235 04/21/2017     Recent Labs Lab 04/21/17 0308  NA 137  K 3.9  CL 105  CO2 24  BUN 8  CREATININE 0.63  CALCIUM 9.0  GLUCOSE 169*   Lab Results  Component Value Date   CHOL 168 04/16/2017   HDL 52 04/16/2017   LDLCALC 103 (H) 04/16/2017   TRIG 67 04/16/2017   BNP (last 3 results)  Recent Labs  04/15/17 1631  BNP 444.4*    ProBNP (last 3 results) No results  for input(s): PROBNP in the last 8760 hours.   Diagnostic Studies/Procedures   [facility] 04/15/17 1. Mildly moderately elevated left heart filling pressures. 2. Mild pulmonary hypertension. 3. Normal right heart filling pressure. 4. Normal Fick cardiac output/index. 5. No angiographically significant coronary artery disease. RHC Procedural Findings: RA (mean): 5 mmHg RV (S/EDP): 52/4 mmHg PA (S/D, mean): 52/20 (31) mmHg PCWP (mean): 30 mmHg  Ao sat: 96% PA sat: 71% RA sat: 74%  Fick CO: 5.3 L/min Fick CI: 3.3 L/min/m^2  SVR: 1,101 dynscm-5  Discharge Medications   Allergies as of 04/21/2017   No Known Allergies     Medication List    STOP taking these medications   furosemide 40 MG tablet Commonly known as:  LASIX   metoprolol tartrate 25 MG tablet Commonly known as:  LOPRESSOR     TAKE these medications   acetaminophen 325 MG tablet Commonly known as:  TYLENOL Take 2 tablets (650 mg total) by mouth every 4 (four) hours as needed for headache or mild pain.   amiodarone 200 MG tablet Commonly known as:  PACERONE Tome 2 pastillas (400 mg) dos veces al dia por 7 dias, despues tome 1 pastilla (200 mg) dos veces al dia.   digoxin 0.125 MG tablet Commonly known as:  LANOXIN Tome 1 pastilla una vez al dia   spironolactone 25 MG tablet Commonly known as:  ALDACTONE  Tome 1/2 (media) pastilla un vez al dia            Discharge Care Instructions        Start     Ordered   04/21/17 0000  amiodarone (PACERONE) 200 MG tablet    Question:  Supervising Provider  Answer:  Jolaine Artist   04/21/17 1430   04/21/17 0000  digoxin (LANOXIN) 0.125 MG tablet    Question:  Supervising Provider  Answer:  Glori Bickers R   04/21/17 1430   04/21/17 0000  spironolactone (ALDACTONE) 25 MG tablet    Question:  Supervising Provider  Answer:  Glori Bickers R   04/21/17 1430   04/21/17 0000  acetaminophen (TYLENOL) 325 MG tablet  Every 4 hours PRN    Question:   Supervising Provider  Answer:  Glori Bickers R   04/21/17 1430   04/21/17 0000  Diet - low sodium heart healthy     04/21/17 1430   04/21/17 0000  Increase activity slowly     04/21/17 1430   04/21/17 0000  (HEART FAILURE PATIENTS) Call MD:  Anytime you have any of the following symptoms: 1) 3 pound weight gain in 24 hours or 5 pounds in 1 week 2) shortness of breath, with or without a dry hacking cough 3) swelling in the hands, feet or stomach 4) if you have to sleep on extra pillows at night in order to breathe.     04/21/17 1430   04/21/17 0000  Heart Failure patients record your daily weight using the same scale at the same time of day     04/21/17 1430      Disposition   The patient will be discharged in stable condition to home. Discharge Instructions    (HEART FAILURE PATIENTS) Call MD:  Anytime you have any of the following symptoms: 1) 3 pound weight gain in 24 hours or 5 pounds in 1 week 2) shortness of breath, with or without a dry hacking cough 3) swelling in the hands, feet or stomach 4) if you have to sleep on extra pillows at night in order to breathe.    Complete by:  As directed    Diet - low sodium heart healthy    Complete by:  As directed    Heart Failure patients record your daily weight using the same scale at the same time of day    Complete by:  As directed    Increase activity slowly    Complete by:  As directed      Follow-up Information    [place] Follow up on 05/02/2017.   Specialty:  Cardiology Why:  at 2:00 pm for hospital follow up. Garage code [id number].  Contact information: [address] [id number] mc [place] [facility] Follow up on 04/28/2017.   Why:  Hospital Follow Up at 2:00 pm with Dr. Ladonna Snide Contact information:  Address: [address] [place], [place] [address] Phone: [phone]            Duration of Discharge  Encounter: Greater than 35 minutes   Signed, Annamaria Helling 04/21/2017, 2:53 PM

## 2017-05-02 ENCOUNTER — Ambulatory Visit (HOSPITAL_COMMUNITY)
Admit: 2017-05-02 | Discharge: 2017-05-02 | Disposition: A | Discharge status: Home / Self Care | Payer: Self-pay | Attending: Cardiology | Admitting: Cardiology

## 2017-05-02 ENCOUNTER — Encounter (HOSPITAL_COMMUNITY): Payer: Self-pay

## 2017-05-02 VITALS — BP 110/74 | HR 90 | Wt 140.8 lb

## 2017-05-02 DIAGNOSIS — G8929 Other chronic pain: Secondary | ICD-10-CM | POA: Insufficient documentation

## 2017-05-02 DIAGNOSIS — M25569 Pain in unspecified knee: Secondary | ICD-10-CM | POA: Insufficient documentation

## 2017-05-02 DIAGNOSIS — I429 Cardiomyopathy, unspecified: Secondary | ICD-10-CM | POA: Insufficient documentation

## 2017-05-02 DIAGNOSIS — D649 Anemia, unspecified: Secondary | ICD-10-CM | POA: Insufficient documentation

## 2017-05-02 DIAGNOSIS — I428 Other cardiomyopathies: Secondary | ICD-10-CM

## 2017-05-02 DIAGNOSIS — I493 Ventricular premature depolarization: Secondary | ICD-10-CM | POA: Insufficient documentation

## 2017-05-02 DIAGNOSIS — I5022 Chronic systolic (congestive) heart failure: Secondary | ICD-10-CM | POA: Insufficient documentation

## 2017-05-02 LAB — BASIC METABOLIC PANEL
ANION GAP: 10 (ref 5–15)
BUN: 11 mg/dL (ref 6–20)
CHLORIDE: 103 mmol/L (ref 101–111)
CO2: 22 mmol/L (ref 22–32)
Calcium: 9.4 mg/dL (ref 8.9–10.3)
Creatinine, Ser: 0.67 mg/dL (ref 0.44–1.00)
GFR calc non Af Amer: >60 mL/min (ref >60)
GLUCOSE: 106 mg/dL — AB (ref 65–99)
POTASSIUM: 4.5 mmol/L (ref 3.5–5.1)
Sodium: 135 mmol/L (ref 135–145)

## 2017-05-02 LAB — BRAIN NATRIURETIC PEPTIDE: B Natriuretic Peptide: 431.7 pg/mL — ABNORMAL HIGH (ref 0.0–100.0)

## 2017-05-02 MED ORDER — FUROSEMIDE 20 MG PO TABS: ORAL_TABLET | ORAL | 3 refills | Status: DC

## 2017-05-02 MED ORDER — SPIRONOLACTONE 25 MG PO TABS: ORAL_TABLET | ORAL | 6 refills | Status: DC

## 2017-05-02 NOTE — Progress Notes (Signed)
Advanced Heart Failure Clinic Note    Primary Care: No PCP Primary Cardiologist: Dr. Haroldine Laws, Dr. [name] in [place]   HPI: 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    She presented to [facility] on 04/13/17 with neck pain, SOB and orthopnea. Last EF reported by Dr. [name] was 35%. Repeat echo showed reduced EF of 15%, she was transferred to [facility] for further evaluation. Underwent right and left heart catheterization on 04/15/17, no CAD. Fick output/index 5.28/3.34. Full results below.   Noted to have >20% PVC on telemetry. She was started on IV Amio and transitioned to po Amio. Also started on digoxin and spiro prior to discharge. BP was too soft to add any other medications. Discharge weight was 143 pounds.   She presents today for post hospital follow up. Overall feeling much better. She has more energy and does not feel SOB with walking around stores but says that she tires easily with stairs. She notes that her weight at home is up about 5 pounds since discharge, although not reflected by our scales. Has 2 pillow orthopnea, which is new for her. Denies chest pain, palpitations.    Review of Systems: [y] = yes, [ ]  = no   General: Weight gain [ y]; Weight loss [ ] ; Anorexia [ ] ; Fatigue [ ] ; Fever [ ] ; Chills [ ] ; Weakness [ ]   Cardiac: Chest pain/pressure [ ] ; Resting SOB [ ] ; Exertional SOB [ y]; Orthopnea [ y]; Pedal Edema [ ] ; Palpitations [ ] ; Syncope [ ] ; Presyncope [ ] ; Paroxysmal nocturnal dyspnea[ ]   Pulmonary: Cough [ ] ; Wheezing[ ] ; Hemoptysis[ ] ; Sputum [ ] ; Snoring [ ]   GI: Vomiting[ ] ; Dysphagia[ ] ; Melena[ ] ; Hematochezia [ ] ; Heartburn[ ] ; Abdominal pain [ ] ; Constipation [ ] ; Diarrhea [ ] ; BRBPR [ ]   GU: Hematuria[ ] ; Dysuria [ ] ; Nocturia[ ]   Vascular: Pain in legs with walking [ ] ; Pain in feet with lying flat [ ] ; Non-healing sores [ ] ;  Stroke [ ] ; TIA [ ] ; Slurred speech [ ] ;  Neuro: Headaches[ ] ; Vertigo[ ] ; Seizures[ ] ; Paresthesias[ ] ;Blurred vision [ ] ; Diplopia [ ] ; Vision changes [ ]   Ortho/Skin: Arthritis [ ] ; Joint pain [ ] ; Muscle pain [ ] ; Joint swelling [ ] ; Back Pain [ ] ; Rash [ ]   Psych: Depression[ ] ; Anxiety[ ]   Heme: Bleeding problems [ ] ; Clotting disorders [ ] ; Anemia [ ]   Endocrine: Diabetes [ ] ; Thyroid dysfunction[ ]    Past Medical History:  Diagnosis Date  . Anemia   . Arthritis    "knees, hands" (04/15/2017)  . Chronic knee pain   . Chronic systolic CHF (congestive heart failure) ([place])   . Pneumonia 06/2016    Current Outpatient Prescriptions  Medication Sig Dispense Refill  . acetaminophen (TYLENOL) 325 MG tablet Take 2 tablets (650 mg total) by mouth every 4 (four) hours as needed for headache or mild pain.    Marland Kitchen amiodarone (PACERONE) 200 MG tablet Tome 2 pastillas (400 mg) dos veces al dia por 7 dias, despues tome 1 pastilla (200 mg) dos veces al dia. 75 tablet 1  . digoxin (LANOXIN) 0.125 MG tablet Tome 1 pastilla una vez al dia 30 tablet 6  . spironolactone (ALDACTONE) 25 MG tablet Tome 1/2 (media) pastilla un vez al dia 15 tablet 6   No current facility-administered medications for this  encounter.     No Known Allergies    Social History   Social History  . Marital status: Divorced    Spouse name: N/A  . Number of children: N/A  . Years of education: N/A   Occupational History  . Not on file.   Social History Main Topics  . Smoking status: Never Smoker  . Smokeless tobacco: Never Used  . Alcohol use Yes     Comment: 04/15/2017 "might have a couple beers q couple months"  . Drug use: No  . Sexual activity: No   Other Topics Concern  . Not on file   Social History Narrative  . No narrative on file      Family History  Problem Relation Age of Onset  . Hypertension Mother     Vitals:   05/02/17 1401  BP: 110/74  Pulse: 90  SpO2: 100%  Weight: 140 lb 12.8 oz  (63.9 kg)     PHYSICAL EXAM: General:  Well appearing. No respiratory difficulty HEENT: normal Neck: supple. no JVD. Carotids 2+ bilat; no bruits. No lymphadenopathy or thyromegaly appreciated. Cor: PMI nondisplaced. Regular rate & rhythm. No rubs, gallops or murmurs. Lungs: clear Abdomen: soft, nontender, nondistended. No hepatosplenomegaly. No bruits or masses. Good bowel sounds. Extremities: no cyanosis, clubbing, rash. 1+ pedal edema bilaterally.  Neuro: alert & oriented x 3, cranial nerves grossly intact. moves all 4 extremities w/o difficulty. Affect pleasant.    ASSESSMENT & PLAN: 1. Chronic systolic CHF: NICM, normal cors in 03/2017. PVC cardiomyopathy. EF 15-20% - NYHA II - Start lasix 20 mg daily, appears mildly volume overloaded on exam - Increase Spiro to 25 mg and change to hs dosing.  - BMET, BNP today.  - Will repeat BMET in 10-14 days after increasing [name].  - Continue digoxin   2. Frequent PVC's - Continue Amio 200 mg BID. Can likely decrease next visit to 200 mg daily.  - No PVC's on exam.   I have provided her a note to stay out of work for the next 2 weeks. She has to lift 15-20 pounds at work and this is very strenuous for her.     Arbutus Leas, NP 05/02/17

## 2017-05-02 NOTE — Patient Instructions (Signed)
START Lasix 20 mg: Tome 1 pastilla un vez al dia  INCREASE Spironolactone to 25 mg: Tome 1 pastilla un vez al dia  Routine lab work today. Will notify you of abnormal results, otherwise no news is good news!  Follow up 2 weeks.  ___________________________________________________________________  ___________________________________________________________________  Take all medication as prescribed the day of your appointment. Bring all medications with you to your appointment.  Do the following things EVERYDAY: 1) Weigh yourself in the morning before breakfast. Write it down and keep it in a log. 2) Take your medicines as prescribed 3) Eat low salt foods-Limit salt (sodium) to 2000 mg per day.  4) Stay as active as you can everyday 5) Limit all fluids for the day to less than 2 liters

## 2017-05-11 ENCOUNTER — Telehealth: Payer: Self-pay | Admitting: Cardiology

## 2017-05-11 NOTE — Telephone Encounter (Signed)
Pt called though interpreter. She has been having increased DOE x 48 hrs. She does not weigh herself. She reports compliance with her medications. She declined to come to the ED but requested an office visit at CHF clinic.  Kerin Ransom PA-C 05/11/2017 5:56 PM

## 2017-05-13 ENCOUNTER — Ambulatory Visit (HOSPITAL_COMMUNITY)
Admission: RE | Admit: 2017-05-13 | Discharge: 2017-05-13 | Disposition: A | Discharge status: Home / Self Care | Payer: Self-pay | Source: Ambulatory Visit | Attending: Cardiology | Admitting: Cardiology

## 2017-05-13 ENCOUNTER — Encounter (HOSPITAL_COMMUNITY): Payer: Self-pay | Admitting: Cardiology

## 2017-05-13 VITALS — BP 104/70 | HR 78 | Wt 141.0 lb

## 2017-05-13 DIAGNOSIS — M1990 Unspecified osteoarthritis, unspecified site: Secondary | ICD-10-CM | POA: Insufficient documentation

## 2017-05-13 DIAGNOSIS — I493 Ventricular premature depolarization: Secondary | ICD-10-CM | POA: Insufficient documentation

## 2017-05-13 DIAGNOSIS — I429 Cardiomyopathy, unspecified: Secondary | ICD-10-CM | POA: Insufficient documentation

## 2017-05-13 DIAGNOSIS — R05 Cough: Secondary | ICD-10-CM | POA: Insufficient documentation

## 2017-05-13 DIAGNOSIS — I240 Acute coronary thrombosis not resulting in myocardial infarction: Secondary | ICD-10-CM

## 2017-05-13 DIAGNOSIS — K219 Gastro-esophageal reflux disease without esophagitis: Secondary | ICD-10-CM

## 2017-05-13 DIAGNOSIS — I428 Other cardiomyopathies: Secondary | ICD-10-CM

## 2017-05-13 DIAGNOSIS — Z8249 Family history of ischemic heart disease and other diseases of the circulatory system: Secondary | ICD-10-CM | POA: Insufficient documentation

## 2017-05-13 DIAGNOSIS — I5022 Chronic systolic (congestive) heart failure: Secondary | ICD-10-CM | POA: Insufficient documentation

## 2017-05-13 DIAGNOSIS — Z79899 Other long term (current) drug therapy: Secondary | ICD-10-CM | POA: Insufficient documentation

## 2017-05-13 DIAGNOSIS — I513 Intracardiac thrombosis, not elsewhere classified: Secondary | ICD-10-CM

## 2017-05-13 LAB — BASIC METABOLIC PANEL
Anion gap: 9 (ref 5–15)
BUN: 10 mg/dL (ref 6–20)
CO2: 24 mmol/L (ref 22–32)
CREATININE: 0.78 mg/dL (ref 0.44–1.00)
Calcium: 10 mg/dL (ref 8.9–10.3)
Chloride: 103 mmol/L (ref 101–111)
GFR calc Af Amer: >60 mL/min (ref >60)
GFR calc non Af Amer: >60 mL/min (ref >60)
Glucose, Bld: 104 mg/dL — ABNORMAL HIGH (ref 65–99)
POTASSIUM: 4.1 mmol/L (ref 3.5–5.1)
SODIUM: 136 mmol/L (ref 135–145)

## 2017-05-13 MED ORDER — FUROSEMIDE 20 MG PO TABS: 20.0000 mg | ORAL_TABLET | ORAL | 3 refills | Status: DC

## 2017-05-13 MED ORDER — PANTOPRAZOLE SODIUM 40 MG PO TBEC: 40.0000 mg | DELAYED_RELEASE_TABLET | Freq: Every day | ORAL | 1 refills | Status: DC

## 2017-05-13 NOTE — Patient Instructions (Addendum)
START Protonix 40 mg, Tome 1 pastilla un vez al dia CHANGE Lasix 20 mg, Tome 1 pastilla cada otro dia  Labs today We will only contact you if something comes back abnormal or we need to make some changes. Otherwise no news is good news!    Laboratorios hoy Solo lo contactaremos si algo regresa anormal o si necesitamos hacer algunos cambios. De lo contrario, ninguna noticia es una buena noticia!  Your physician recommends that you schedule a follow-up appointment in: 2 weeks    Su mdico le recomienda que programe una cita de seguimiento en: 2 semanas

## 2017-05-13 NOTE — Progress Notes (Signed)
Advanced Heart Failure Medication Review by a Pharmacist  Does the patient  feel that his/her medications are working for him/her?  yes  Has the patient been experiencing any side effects to the medications prescribed?  no  Does the patient measure his/her own blood pressure or blood glucose at home?  no   Does the patient have any problems obtaining medications due to transportation or finances?   Yes - no Rx insurance but able to afford meds currently  Understanding of regimen: fair Understanding of indications: fair Potential of compliance: fair Patient understands to avoid NSAIDs. Patient understands to avoid decongestants.  Issues to address at subsequent visits: None   Pharmacist comments: [name] is a pleasant 56 yo F presenting with a Spanish interpreter and her current medication bottles. She reports good compliance with her regimen but states that she has been having a lot of phlegm at night for which she was given gabapentin which is not alleviating her symptoms. She did not have any other medication-related questions or concerns for me at this time.   Ruta Hinds. Velva Harman, PharmD, BCPS, CPP Clinical Pharmacist Pager: [phone] Phone: [phone] 05/13/2017 2:12 PM      Time with patient: 10 minutes Preparation and documentation time: 2 minutes Total time: 12 minutes

## 2017-05-13 NOTE — Progress Notes (Signed)
Advanced Heart Failure Clinic Note    Primary Care: No PCP Primary Cardiologist: Dr. Haroldine Laws, Dr. [name] in [place]   HPI: 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    She presented to [facility] on 04/13/17 with neck pain, SOB and orthopnea. Last EF reported by Dr. [name] was 35%. Repeat echo showed reduced EF of 15%, she was transferred to [facility] for further evaluation. Underwent right and left heart catheterization on 04/15/17, no CAD. Fick output/index 5.28/3.34. Full results below.   Noted to have >20% PVC on telemetry. She was started on IV Amio and transitioned to po Amio. Also started on digoxin and spiro prior to discharge. BP was too soft to add any other medications. Discharge weight was 143 pounds.   Returns today for follow up. Main compliant today is feeling a fullness in her throat, like she needs to cough to remove phelgm when she lies down at night. This problem started in January of this year, but has recently gotten worse. Also feeling dizzy when she changes positions. No syncope or presyncope. Denies chest pain, palpitations. Interpretor present at visit. She is taking all medications. Drinking less than 2L a day, eating a fairly low salt diet. Weight at home is trending down. Down 3 pounds by our scales in 2 weeks and down 7 pounds at home.  Review of Systems: [y] = yes, [ ]  = no   General: Weight gain [ ] ; Weight loss Blue.Reese ]; Anorexia [ ] ; Fatigue [ ] ; Fever [ ] ; Chills [ ] ; Weakness [ ]   Cardiac: Chest pain/pressure [ ] ; Resting SOB [ ] ; Exertional SOB [ ] ; Orthopnea [ ] ; Pedal Edema [ ] ; Palpitations [ ] ; Syncope [ ] ; Presyncope [ ] ; Paroxysmal nocturnal dyspnea[ ]   Pulmonary: Cough Blue.Reese ]; Wheezing[ ] ; Hemoptysis[ ] ; Sputum [ ] ; Snoring [ ]   GI: Vomiting[ ] ; Dysphagia[ ] ; Melena[ ] ; Hematochezia [ ] ; Heartburn[ ] ; Abdominal pain [ ] ; Constipation [  ]; Diarrhea [ ] ; BRBPR [ ]   GU: Hematuria[ ] ; Dysuria [ ] ; Nocturia[ ]   Vascular: Pain in legs with walking [ ] ; Pain in feet with lying flat [ ] ; Non-healing sores [ ] ; Stroke [ ] ; TIA [ ] ; Slurred speech [ ] ;  Neuro: Headaches[ ] ; Vertigo[ ] ; Seizures[ ] ; Paresthesias[ ] ;Blurred vision [ ] ; Diplopia [ ] ; Vision changes [ ]   Ortho/Skin: Arthritis [ ] ; Joint pain [ ] ; Muscle pain [ ] ; Joint swelling [ ] ; Back Pain [ ] ; Rash [ ]   Psych: Depression[ ] ; Anxiety[ ]   Heme: Bleeding problems [ ] ; Clotting disorders [ ] ; Anemia [ ]   Endocrine: Diabetes [ ] ; Thyroid dysfunction[ ]    Past Medical History:  Diagnosis Date  . Anemia   . Arthritis    "knees, hands" (04/15/2017)  . Chronic knee pain   . Chronic systolic CHF (congestive heart failure) ([place])   . Pneumonia 06/2016    Current Outpatient Prescriptions  Medication Sig Dispense Refill  . acetaminophen (TYLENOL) 325 MG tablet Take 2 tablets (650 mg total) by mouth every 4 (four) hours as needed for headache or mild pain.    Marland Kitchen amiodarone (PACERONE) 200 MG tablet Tome 1 pastilla (200 mg) dos veces al dia.    Marland Kitchen digoxin (LANOXIN) 0.125 MG tablet Tome 1 pastilla una vez al dia 30 tablet 6  . furosemide (LASIX) 20 MG tablet Take 1  tablet (20 mg total) by mouth every other day. Tome 1 pastilla cada otro dia 90 tablet 3  . gabapentin (NEURONTIN) 100 MG capsule Take 100 mg by mouth at bedtime.    Marland Kitchen spironolactone (ALDACTONE) 25 MG tablet Tome 1 pastilla un vez al dia 30 tablet 6  . pantoprazole (PROTONIX) 40 MG tablet Take 1 tablet (40 mg total) by mouth daily. Tome 1 pastilla un vez al dia 30 tablet 1   No current facility-administered medications for this encounter.     No Known Allergies    Social History   Social History  . Marital status: Divorced    Spouse name: N/A  . Number of children: N/A  . Years of education: N/A   Occupational History  . Not on file.   Social History Main Topics  . Smoking status: Never Smoker  .  Smokeless tobacco: Never Used  . Alcohol use Yes     Comment: 04/15/2017 "might have a couple beers q couple months"  . Drug use: No  . Sexual activity: No   Other Topics Concern  . Not on file   Social History Narrative  . No narrative on file      Family History  Problem Relation Age of Onset  . Hypertension Mother     Vitals:   05/13/17 1614  BP: 104/70  Pulse: 78  SpO2: 100%  Weight: 141 lb (64 kg)     PHYSICAL EXAM: General:  Well appearing. No respiratory difficulty HEENT: normal Neck: supple. no JVD. Carotids 2+ bilat; no bruits. No lymphadenopathy or thyromegaly appreciated. Cor: PMI nondisplaced. Regular rate & rhythm. No rubs, gallops or murmurs. Lungs: clear Abdomen: soft, nontender, nondistended. No hepatosplenomegaly. No bruits or masses. Good bowel sounds. Extremities: no cyanosis, clubbing, rash. 1+ pedal edema bilaterally.  Neuro: alert & oriented x 3, cranial nerves grossly intact. moves all 4 extremities w/o difficulty. Affect pleasant.    ASSESSMENT & PLAN: 1. Chronic systolic CHF: NICM, normal cors in 03/2017. PVC cardiomyopathy. 03/2017  EF 15-20%  - NYHA II - Reduce Lasix 20 mg to every other day dosing.  - Continue Spiro 25 mg hs.  - Continue digoxin  - BP too soft for beta blocker.  - Continue daily weights, 2L fluid restriction and low sodium diet.   2. Frequent PVC's - No PVC's on exam - Continue Amio   3. Cough - Mostly when she lies down at night.  - Symptoms sound like GERD. She says she has been taking Neurontin for this (?). I advised her to ask her [facility] who prescribes this if that is in fact the indication.  - Start protonix 40 mg daily.   BMET today, follow up in 2 weeks.     Arbutus Leas, NP 05/13/17

## 2017-05-13 NOTE — Progress Notes (Signed)
    ReDS Vest - 05/13/17 1400      ReDS Vest   MR  No   Estimated volume prior to reading Med   Fitting Posture Standing   Height Marker Short   Ruler Value [place]   ReDS Value 23   Anatomical Comments very rounded back

## 2017-05-16 ENCOUNTER — Encounter (HOSPITAL_COMMUNITY): Payer: Self-pay

## 2017-05-27 ENCOUNTER — Encounter (HOSPITAL_COMMUNITY): Payer: Self-pay

## 2017-05-31 ENCOUNTER — Encounter (HOSPITAL_COMMUNITY): Payer: Self-pay

## 2017-05-31 ENCOUNTER — Ambulatory Visit (HOSPITAL_COMMUNITY)
Admission: RE | Admit: 2017-05-31 | Discharge: 2017-05-31 | Disposition: A | Discharge status: Home / Self Care | Payer: Self-pay | Source: Ambulatory Visit | Attending: Internal Medicine | Admitting: Internal Medicine

## 2017-05-31 VITALS — BP 108/64 | HR 96 | Wt 145.0 lb

## 2017-05-31 DIAGNOSIS — I420 Dilated cardiomyopathy: Secondary | ICD-10-CM | POA: Insufficient documentation

## 2017-05-31 DIAGNOSIS — I38 Endocarditis, valve unspecified: Secondary | ICD-10-CM

## 2017-05-31 DIAGNOSIS — K219 Gastro-esophageal reflux disease without esophagitis: Secondary | ICD-10-CM

## 2017-05-31 DIAGNOSIS — I493 Ventricular premature depolarization: Secondary | ICD-10-CM | POA: Insufficient documentation

## 2017-05-31 DIAGNOSIS — Z01810 Encounter for preprocedural cardiovascular examination: Secondary | ICD-10-CM | POA: Insufficient documentation

## 2017-05-31 DIAGNOSIS — Z01812 Encounter for preprocedural laboratory examination: Secondary | ICD-10-CM | POA: Insufficient documentation

## 2017-05-31 DIAGNOSIS — I428 Other cardiomyopathies: Secondary | ICD-10-CM

## 2017-05-31 DIAGNOSIS — I513 Intracardiac thrombosis, not elsewhere classified: Secondary | ICD-10-CM | POA: Insufficient documentation

## 2017-05-31 DIAGNOSIS — I4581 Long QT syndrome: Secondary | ICD-10-CM | POA: Insufficient documentation

## 2017-05-31 DIAGNOSIS — I491 Atrial premature depolarization: Secondary | ICD-10-CM | POA: Insufficient documentation

## 2017-05-31 DIAGNOSIS — I517 Cardiomegaly: Secondary | ICD-10-CM | POA: Insufficient documentation

## 2017-05-31 DIAGNOSIS — I5022 Chronic systolic (congestive) heart failure: Secondary | ICD-10-CM | POA: Insufficient documentation

## 2017-05-31 LAB — BASIC METABOLIC PANEL
Anion gap: 10 (ref 5–15)
BUN: 9 mg/dL (ref 6–20)
CALCIUM: 9 mg/dL (ref 8.9–10.3)
CO2: 24 mmol/L (ref 22–32)
CREATININE: 0.56 mg/dL (ref 0.44–1.00)
Chloride: 101 mmol/L (ref 101–111)
GFR calc non Af Amer: >60 mL/min (ref >60)
Glucose, Bld: 115 mg/dL — ABNORMAL HIGH (ref 65–99)
Potassium: 3.8 mmol/L (ref 3.5–5.1)
SODIUM: 135 mmol/L (ref 135–145)

## 2017-05-31 MED ORDER — AMIODARONE HCL 200 MG PO TABS: 200.0000 mg | ORAL_TABLET | Freq: Two times a day (BID) | ORAL | 3 refills | Status: DC

## 2017-05-31 MED ORDER — FUROSEMIDE 20 MG PO TABS: 20.0000 mg | ORAL_TABLET | ORAL | 3 refills | Status: DC

## 2017-05-31 NOTE — Patient Instructions (Addendum)
Take Furosemide 20 mg (1 tab) every day for 3 days  Labs drawn today (if we do not call you, then your lab work was stable)    Refill of Amiodarone sent to pharmacy   Your physician recommends that you schedule a follow-up appointment in: 3 months with and echocardiogram

## 2017-05-31 NOTE — Progress Notes (Signed)
Advanced Heart Failure Clinic Note    Primary Care: No PCP Primary Cardiologist: Dr. Haroldine Laws, Dr. [name] in [place]   HPI: 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    She presented to [facility] on 04/13/17 with neck pain, SOB and orthopnea. Last EF reported by Dr. [name] was 35%. Repeat echo showed reduced EF of 15%, she was transferred to [facility] for further evaluation. Underwent right and left heart catheterization on 04/15/17, no CAD. Fick output/index 5.28/3.34. Full results below.   Noted to have >20% PVC on telemetry. She was started on IV Amio and transitioned to po Amio. Also started on digoxin and spiro prior to discharge. BP was too soft to add any other medications. Discharge weight was 143 pounds.   Returns today for HF follow up. Weight stable at home at 144-145 pounds. She denies palpitations, chest pain. Says that when she bends over she feels a fullness in her chest and bilateral arms. Taking all medications, but has been out of digoxin and Amio for 6 days, when she called the pharmacy she says she was told they could not refill them. Her nighttime cough has resolved with Protonix.   Review of Systems: [y] = yes, [ ]  = no   General: Weight gain [ ] ; Weight loss Blue.Reese ]; Anorexia [ ] ; Fatigue [ ] ; Fever [ ] ; Chills [ ] ; Weakness [ ]   Cardiac: Chest pain/pressure [ ] ; Resting SOB [ ] ; Exertional SOB [ ] ; Orthopnea [ ] ; Pedal Edema [ ] ; Palpitations [ ] ; Syncope [ ] ; Presyncope [ ] ; Paroxysmal nocturnal dyspnea[ ]   Pulmonary: Cough [ ] ; Wheezing[ ] ; Hemoptysis[ ] ; Sputum [ ] ; Snoring [ ]   GI: Vomiting[ ] ; Dysphagia[ ] ; Melena[ ] ; Hematochezia [ ] ; Heartburn[ ] ; Abdominal pain [ ] ; Constipation [ ] ; Diarrhea [ ] ; BRBPR [ ]   GU: Hematuria[ ] ; Dysuria [ ] ; Nocturia[ ]   Vascular: Pain in legs with walking [ ] ; Pain in feet with lying flat [ ] ; Non-healing  sores [ ] ; Stroke [ ] ; TIA [ ] ; Slurred speech [ ] ;  Neuro: Headaches[ ] ; Vertigo[ ] ; Seizures[ ] ; Paresthesias[ ] ;Blurred vision [ ] ; Diplopia [ ] ; Vision changes [ ]   Ortho/Skin: Arthritis Blue.Reese ]; Joint pain [ ] ; Muscle pain [ ] ; Joint swelling [ ] ; Back Pain [ ] ; Rash [ ]   Psych: Depression[ ] ; Anxiety[ ]   Heme: Bleeding problems [ ] ; Clotting disorders [ ] ; Anemia [ ]   Endocrine: Diabetes [ ] ; Thyroid dysfunction[ ]    Past Medical History:  Diagnosis Date  . Anemia   . Arthritis    "knees, hands" (04/15/2017)  . Chronic knee pain   . Chronic systolic CHF (congestive heart failure) ([place])   . Pneumonia 06/2016    Current Outpatient Prescriptions  Medication Sig Dispense Refill  . acetaminophen (TYLENOL) 325 MG tablet Take 2 tablets (650 mg total) by mouth every 4 (four) hours as needed for headache or mild pain.    Marland Kitchen amiodarone (PACERONE) 200 MG tablet Tome 1 pastilla (200 mg) dos veces al dia.    Marland Kitchen digoxin (LANOXIN) 0.125 MG tablet Tome 1 pastilla una vez al dia 30 tablet 6  . furosemide (LASIX) 20 MG tablet Take 1 tablet (20 mg total) by mouth every other day. Tome 1 pastilla cada otro dia 90 tablet 3  . gabapentin (NEURONTIN) 100 MG capsule Take 100 mg  by mouth at bedtime.    . pantoprazole (PROTONIX) 40 MG tablet Take 1 tablet (40 mg total) by mouth daily. Tome 1 pastilla un vez al dia 30 tablet 1  . spironolactone (ALDACTONE) 25 MG tablet Tome 1 pastilla un vez al dia 30 tablet 6   No current facility-administered medications for this encounter.     No Known Allergies    Social History   Social History  . Marital status: Divorced    Spouse name: N/A  . Number of children: N/A  . Years of education: N/A   Occupational History  . Not on file.   Social History Main Topics  . Smoking status: Never Smoker  . Smokeless tobacco: Never Used  . Alcohol use Yes     Comment: 04/15/2017 "might have a couple beers q couple months"  . Drug use: No  . Sexual activity: No    Other Topics Concern  . Not on file   Social History Narrative  . No narrative on file      Family History  Problem Relation Age of Onset  . Hypertension Mother     Vitals:   05/31/17 1441  BP: 108/64  Pulse: 96  SpO2: 100%  Weight: 145 lb (65.8 kg)     PHYSICAL EXAM: General: Well appearing. No resp difficulty. HEENT: Normal Neck: Supple. JVP 5-6. Carotids 2+ bilat; no bruits. No thyromegaly or nodule noted. Cor: PMI nondisplaced. RRR, No M/G/R noted Lungs: CTAB, normal effort. Abdomen: Soft, non-tender, non-distended, no HSM. No bruits or masses. +BS  Extremities: No cyanosis, clubbing, rash, R and LLE no edema.  Neuro: Alert & orientedx3, cranial nerves grossly intact. moves all 4 extremities w/o difficulty. Affect pleasant  EKG : NSR    ASSESSMENT & PLAN: 1. Chronic systolic CHF: NICM, normal cors in 03/2017. PVC cardiomyopathy. 03/2017  EF 15-20%  - NYHA II - Volume stable, but with chest fullness when bending over, will increase lasix to 20 mg daily for 3 days, then go back to every other day dosing.  - Continue Spiro 25 mg daily - Continue digoxin.  - BP too soft to add other HF meds.  - Will need repeat Echo and follow up with Dr. Haroldine Laws in 6-8 weeks to see if EF improved since PVC have been suppressed.   2. Frequent PVC's - Continue Amio 200 mg BID.   3. Cough - Resolved with Protonix.   Follow up in 6-8 week with Dr. Haroldine Laws with an Echo.    Arbutus Leas, NP 05/31/17

## 2017-07-28 ENCOUNTER — Ambulatory Visit (HOSPITAL_COMMUNITY)
Admission: RE | Admit: 2017-07-28 | Discharge: 2017-07-28 | Disposition: A | Discharge status: Home / Self Care | Payer: Self-pay | Source: Ambulatory Visit | Attending: Internal Medicine | Admitting: Internal Medicine

## 2017-07-28 ENCOUNTER — Ambulatory Visit (HOSPITAL_BASED_OUTPATIENT_CLINIC_OR_DEPARTMENT_OTHER)
Admission: RE | Admit: 2017-07-28 | Discharge: 2017-07-28 | Disposition: A | Discharge status: Home / Self Care | Payer: Self-pay | Source: Ambulatory Visit | Attending: Internal Medicine | Admitting: Internal Medicine

## 2017-07-28 VITALS — BP 106/74 | HR 80 | Wt 141.4 lb

## 2017-07-28 DIAGNOSIS — M1990 Unspecified osteoarthritis, unspecified site: Secondary | ICD-10-CM | POA: Insufficient documentation

## 2017-07-28 DIAGNOSIS — I429 Cardiomyopathy, unspecified: Secondary | ICD-10-CM | POA: Insufficient documentation

## 2017-07-28 DIAGNOSIS — Z79899 Other long term (current) drug therapy: Secondary | ICD-10-CM | POA: Insufficient documentation

## 2017-07-28 DIAGNOSIS — I5022 Chronic systolic (congestive) heart failure: Secondary | ICD-10-CM | POA: Insufficient documentation

## 2017-07-28 DIAGNOSIS — I493 Ventricular premature depolarization: Secondary | ICD-10-CM | POA: Insufficient documentation

## 2017-07-28 DIAGNOSIS — I5021 Acute systolic (congestive) heart failure: Secondary | ICD-10-CM

## 2017-07-28 DIAGNOSIS — Z8249 Family history of ischemic heart disease and other diseases of the circulatory system: Secondary | ICD-10-CM | POA: Insufficient documentation

## 2017-07-28 DIAGNOSIS — Z9889 Other specified postprocedural states: Secondary | ICD-10-CM | POA: Insufficient documentation

## 2017-07-28 DIAGNOSIS — I38 Endocarditis, valve unspecified: Secondary | ICD-10-CM

## 2017-07-28 LAB — BASIC METABOLIC PANEL
ANION GAP: 7 (ref 5–15)
BUN: 15 mg/dL (ref 6–20)
CALCIUM: 9.4 mg/dL (ref 8.9–10.3)
CO2: 26 mmol/L (ref 22–32)
Chloride: 106 mmol/L (ref 101–111)
Creatinine, Ser: 0.63 mg/dL (ref 0.44–1.00)
Glucose, Bld: 98 mg/dL (ref 65–99)
Potassium: 4.1 mmol/L (ref 3.5–5.1)
SODIUM: 139 mmol/L (ref 135–145)

## 2017-07-28 NOTE — Progress Notes (Signed)
Advanced Heart Failure Clinic Note    Primary Care: No PCP Primary Cardiologist: Dr. Haroldine Laws, Dr. [name] in [place]   HPI: 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    She presented to [facility] on 04/13/17 with neck pain, SOB and orthopnea. Last EF reported by Dr. [name] was 35%. Repeat echo showed reduced EF of 15%, she was transferred to [facility] for further evaluation. Underwent right and left heart catheterization on 04/15/17, no CAD. Fick output/index 5.28/3.34. Full results below.   Noted to have >20% PVC on telemetry. She was started on IV Amio and transitioned to po Amio. Also started on digoxin and spiro prior to discharge. BP was too soft to add any other medications. Discharge weight was 143 pounds.   Returns today for HF follow up. Here with interpreter. Numerous questions today. Denies CP. Says she has mild SOB with walking but it is better. Able to go to store but says she gets dizzy. Also get somewhat SOB but can walk the whole store only stopping 2-3 times.  Continues to have confusion over her medications. Stopped lasix 2 weeks ago and dizziness much better. No edema, orthopnea or PND. Occasional brief palpitations. Weight was 136 but now back up to 141. Stopped digoxin and spiro too.   Echo today EF 25-30% (reviewed personally)   Past Medical History:  Diagnosis Date  . Anemia   . Arthritis    "knees, hands" (04/15/2017)  . Chronic knee pain   . Chronic systolic CHF (congestive heart failure) ([place])   . Pneumonia 06/2016    Current Outpatient Medications  Medication Sig Dispense Refill  . acetaminophen (TYLENOL) 325 MG tablet Take 2 tablets (650 mg total) by mouth every 4 (four) hours as needed for headache or mild pain.    Marland Kitchen amiodarone (PACERONE) 200 MG tablet Take 1 tablet (200 mg total) by mouth 2 (two) times daily. Tome 1 pastilla (200  mg) dos veces al dia. 60 tablet 3  . gabapentin (NEURONTIN) 100 MG capsule Take 100 mg by mouth at bedtime.    . pantoprazole (PROTONIX) 40 MG tablet Take 1 tablet (40 mg total) by mouth daily. Tome 1 pastilla un vez al dia 30 tablet 1  . digoxin (LANOXIN) 0.125 MG tablet Tome 1 pastilla una vez al dia (Patient not taking: Reported on 07/28/2017) 30 tablet 6  . furosemide (LASIX) 20 MG tablet Take 1 tablet (20 mg total) by mouth every other day. Tome 1 pastilla cada otro dia 20 tablet 3  . spironolactone (ALDACTONE) 25 MG tablet Tome 1 pastilla un vez al dia (Patient not taking: Reported on 07/28/2017) 30 tablet 6   No current facility-administered medications for this encounter.     No Known Allergies    Social History   Socioeconomic History  . Marital status: Divorced    Spouse name: Not on file  . Number of children: Not on file  . Years of education: Not on file  . Highest education level: Not on file  Social Needs  . Financial resource strain: Not on file  . Food insecurity - worry: Not on file  . Food insecurity - inability: Not on file  . Transportation needs - medical: Not on file  . Transportation needs - non-medical: Not on file  Occupational History  . Not on file  Tobacco Use  . Smoking status:  Never Smoker  . Smokeless tobacco: Never Used  Substance and Sexual Activity  . Alcohol use: Yes    Comment: 04/15/2017 "might have a couple beers q couple months"  . Drug use: No  . Sexual activity: No  Other Topics Concern  . Not on file  Social History Narrative  . Not on file      Family History  Problem Relation Age of Onset  . Hypertension Mother     Vitals:   07/28/17 1026  BP: 106/74  Pulse: 80  SpO2: 100%  Weight: 141 lb 6.4 oz (64.1 kg)   Filed Weights   07/28/17 1026  Weight: 141 lb 6.4 oz (64.1 kg)    PHYSICAL EXAM: General:  Well appearing. No resp difficulty HEENT: normal Neck: supple. no JVD. Carotids 2+ bilat; no bruits. No  lymphadenopathy or thryomegaly appreciated. Cor: PMI nondisplaced. Regular rate & rhythm. No rubs, gallops or murmurs. Lungs: clear Abdomen: soft, nontender, nondistended. No hepatosplenomegaly. No bruits or masses. Good bowel sounds. Extremities: no cyanosis, clubbing, rash, edema Neuro: alert & orientedx3, cranial nerves grossly intact. moves all 4 extremities w/o difficulty. Affect pleasant   ASSESSMENT & PLAN: 1. Chronic systolic CHF: NICM, normal cors in 03/2017. 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 03/2017  EF 15-20% Echo 07/28/17 EF 25-30% (viewed personally) - EF improving. NYHA II - Volume stable now off lasix due to orthostasis. Take only as needed  - Resume spiro 12.5  - Stop digoxin. (not taking) - Start losartan 25 at night.  - Decrease amio to 200 daily - Order cMRI to further evaluate etiology of cMRI.   2. Frequent PVC's - Suppressed with amio. Decrease to amio 200 daily   Total time spent 45 minutes. Over half that time spent discussing above and going over meds carefully  .   Glori Bickers, MD 07/28/17

## 2017-07-28 NOTE — Progress Notes (Signed)
  Echocardiogram 2D Echocardiogram has been performed.  Darlina Sicilian M 07/28/2017, 10:19 AM

## 2017-07-28 NOTE — Patient Instructions (Addendum)
PARE de tomar digoxin!  STOP DIGOXIN   Comienze a tomar losartan 25 mg (una pildora en la noche).  START LOSARTAN 25 MG, ONE TAB DAILY AT NIGHT  Comienze a tomar spironolactone 12.5 (una pildora al dia).  RESTART SPIRONOLACTONE 12.5 ONE HALF TAB DAILY   Disminuimos la dosis de amiodarone. De hoy en adelante solamente se va a tomar 1 pildora por la maana.  DECREASE AMIODARONE TO 200 MG DAILY    La veremos de nuevo en la oficina en 4 semanas.  RETURN IN 4 WEEKS  Your physician has requested that you have a cardiac MRI. Cardiac MRI uses a computer to create images of your heart as its beating, producing both still and moving pictures of your heart and major blood vessels. For further information please visit [email or web address] Please follow the instruction sheet given to you today for more information.

## 2017-07-29 ENCOUNTER — Telehealth (HOSPITAL_COMMUNITY): Payer: Self-pay

## 2017-07-29 NOTE — Telephone Encounter (Signed)
Pt left meds in office, called this am and afternoon with no answer

## 2017-08-19 ENCOUNTER — Telehealth (HOSPITAL_COMMUNITY): Payer: Self-pay | Admitting: Pharmacist

## 2017-08-19 MED ORDER — AMIODARONE HCL 200 MG PO TABS: 200.0000 mg | ORAL_TABLET | Freq: Every day | ORAL | 5 refills | Status: DC

## 2017-08-19 MED ORDER — LOSARTAN POTASSIUM 25 MG PO TABS: 25.0000 mg | ORAL_TABLET | Freq: Every day | ORAL | 5 refills | Status: DC

## 2017-08-19 MED ORDER — FUROSEMIDE 20 MG PO TABS: 20.0000 mg | ORAL_TABLET | ORAL | 5 refills | Status: DC | PRN

## 2017-08-19 NOTE — Telephone Encounter (Signed)
Received a call from Ms. Benjume's granddaughter stating that [facility] [place] [facility] not [facility] any of her Rx's on file. I have called and verified that they do but new Rx's with medication changes were not sent to pharmacy from most recent clinic visit. Will send new Rx's with updated directions today. Patient's granddaughter aware.    Ruta Hinds. Velva Harman, PharmD, BCPS, CPP Clinical Pharmacist Pager: [phone] Phone: [phone] 08/19/2017 1:37 PM

## 2017-09-12 ENCOUNTER — Encounter (HOSPITAL_COMMUNITY): Payer: Self-pay

## 2017-09-12 ENCOUNTER — Telehealth (HOSPITAL_COMMUNITY): Payer: Self-pay | Admitting: *Deleted

## 2017-09-12 ENCOUNTER — Encounter (HOSPITAL_COMMUNITY): Payer: Self-pay | Admitting: Internal Medicine

## 2017-09-12 ENCOUNTER — Emergency Department (HOSPITAL_COMMUNITY)
Admission: EM | Admit: 2017-09-12 | Discharge: 2017-09-12 | Disposition: A | Discharge status: Home / Self Care | Payer: Self-pay | Attending: Emergency Medicine | Admitting: Emergency Medicine

## 2017-09-12 ENCOUNTER — Other Ambulatory Visit: Payer: Self-pay

## 2017-09-12 ENCOUNTER — Emergency Department (HOSPITAL_COMMUNITY): Payer: Self-pay

## 2017-09-12 DIAGNOSIS — Z5321 Procedure and treatment not carried out due to patient leaving prior to being seen by health care provider: Secondary | ICD-10-CM | POA: Insufficient documentation

## 2017-09-12 DIAGNOSIS — M549 Dorsalgia, unspecified: Secondary | ICD-10-CM | POA: Insufficient documentation

## 2017-09-12 DIAGNOSIS — M542 Cervicalgia: Secondary | ICD-10-CM | POA: Insufficient documentation

## 2017-09-12 DIAGNOSIS — R079 Chest pain, unspecified: Secondary | ICD-10-CM | POA: Insufficient documentation

## 2017-09-12 LAB — CBC
HCT: 44 % (ref 36.0–46.0)
Hemoglobin: 14.7 g/dL (ref 12.0–15.0)
MCH: 31.3 pg (ref 26.0–34.0)
MCHC: 33.4 g/dL (ref 30.0–36.0)
MCV: 93.6 fL (ref 78.0–100.0)
PLATELETS: 336 10*3/uL (ref 150–400)
RBC: 4.7 MIL/uL (ref 3.87–5.11)
RDW: 14.1 % (ref 11.5–15.5)
WBC: 8.6 10*3/uL (ref 4.0–10.5)

## 2017-09-12 LAB — BASIC METABOLIC PANEL
Anion gap: 12 (ref 5–15)
BUN: 11 mg/dL (ref 6–20)
CHLORIDE: 109 mmol/L (ref 101–111)
CO2: 18 mmol/L — AB (ref 22–32)
Calcium: 9 mg/dL (ref 8.9–10.3)
Creatinine, Ser: 0.67 mg/dL (ref 0.44–1.00)
GFR calc non Af Amer: >60 mL/min (ref >60)
Glucose, Bld: 105 mg/dL — ABNORMAL HIGH (ref 65–99)
Potassium: 3.9 mmol/L (ref 3.5–5.1)
Sodium: 139 mmol/L (ref 135–145)

## 2017-09-12 LAB — I-STAT TROPONIN, ED: Troponin i, poc: 0 ng/mL (ref 0.00–0.08)

## 2017-09-12 NOTE — ED Notes (Addendum)
Pt now endorses chest pain that is worse with breathing. EKG completed.

## 2017-09-12 NOTE — ED Notes (Signed)
No answer times 1

## 2017-09-12 NOTE — ED Notes (Signed)
Translator used for triage.

## 2017-09-12 NOTE — ED Triage Notes (Signed)
Pt complains of pain from her neck down into her back and into her hip. State it feels like a burning sensation ongoing since Wednesday. Denies any rash to that area.  Endorses pain is worse with movement. Denies any injury.

## 2017-09-12 NOTE — Telephone Encounter (Signed)
Pt's family member called concerned about pt.  She states pt's daughter was supposed to get off work to bring pt to appt, however she did not and pt does not have transportation at this time.  She states she is very worried about pt, she is having CP and a hard time breathing, she states pt is just weak and doesn't feel well.  Advised pt needs to be seen in ER if active CP.  She states pt does not want to go to [facility] b/c they never do anything for her.  Advised pt needs to go there for eval as it is closer and she can asked to be transferred here.  Family member is agreeable and have pt go to ER.

## 2017-09-26 ENCOUNTER — Ambulatory Visit (HOSPITAL_COMMUNITY)
Admission: RE | Admit: 2017-09-26 | Discharge: 2017-09-26 | Disposition: A | Discharge status: Home / Self Care | Payer: Self-pay | Source: Ambulatory Visit | Attending: Cardiology | Admitting: Cardiology

## 2017-09-26 ENCOUNTER — Encounter (HOSPITAL_COMMUNITY): Payer: Self-pay

## 2017-09-26 VITALS — BP 110/86 | HR 89 | Wt 145.4 lb

## 2017-09-26 DIAGNOSIS — I493 Ventricular premature depolarization: Secondary | ICD-10-CM | POA: Insufficient documentation

## 2017-09-26 DIAGNOSIS — I429 Cardiomyopathy, unspecified: Secondary | ICD-10-CM | POA: Insufficient documentation

## 2017-09-26 DIAGNOSIS — G8929 Other chronic pain: Secondary | ICD-10-CM | POA: Insufficient documentation

## 2017-09-26 DIAGNOSIS — I38 Endocarditis, valve unspecified: Secondary | ICD-10-CM

## 2017-09-26 DIAGNOSIS — I428 Other cardiomyopathies: Secondary | ICD-10-CM

## 2017-09-26 DIAGNOSIS — Z79899 Other long term (current) drug therapy: Secondary | ICD-10-CM | POA: Insufficient documentation

## 2017-09-26 DIAGNOSIS — I5022 Chronic systolic (congestive) heart failure: Secondary | ICD-10-CM | POA: Insufficient documentation

## 2017-09-26 LAB — BASIC METABOLIC PANEL
ANION GAP: 11 (ref 5–15)
BUN: 14 mg/dL (ref 6–20)
CO2: 21 mmol/L — ABNORMAL LOW (ref 22–32)
Calcium: 9 mg/dL (ref 8.9–10.3)
Chloride: 108 mmol/L (ref 101–111)
Creatinine, Ser: 0.75 mg/dL (ref 0.44–1.00)
GFR calc non Af Amer: >60 mL/min (ref >60)
GLUCOSE: 98 mg/dL (ref 65–99)
POTASSIUM: 3.8 mmol/L (ref 3.5–5.1)
SODIUM: 140 mmol/L (ref 135–145)

## 2017-09-26 MED ORDER — SPIRONOLACTONE 25 MG PO TABS: ORAL_TABLET | ORAL | 11 refills | Status: DC

## 2017-09-26 NOTE — Progress Notes (Signed)
Advanced Heart Failure Clinic Note    Primary Care: No PCP Primary Cardiologist: Dr. Haroldine Laws, Dr. [name] in [place]   HPI: Megan Keith is a 57 y.o. 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    She presented to [facility] on 04/13/17 with neck pain, SOB and orthopnea. Last EF reported by Dr. [name] was 35%. Repeat echo showed reduced EF of 15%, she was transferred to [facility] for further evaluation. Underwent right and left heart catheterization on 04/15/17, no CAD. Fick output/index 5.28/3.34. Full results below.   Noted to have >20% PVC on telemetry. She was started on IV Amio and transitioned to po Amio. Also started on digoxin and spiro prior to discharge. BP was too soft to add any other medications. Discharge weight was 143 pounds.   Spanish Interpreter present  She presents today for follow up. At last visit spiro resumed and digoxin stopped. Pt had stopped lasix due to orthostasis. Feeling good today. She has gone back on lasix for the past week due to swelling in her legs. She still gets occasionally SOB with walking 15-20 minutes. She denies lightheadedness or dizziness. No further palpitations. Weight at home 138. She has run out of spironolactone and was told she didn't have any refills.    Echo 07/2017 EF 25-30%   Past Medical History:  Diagnosis Date  . Anemia   . Arthritis    "knees, hands" (04/15/2017)  . Chronic knee pain   . Chronic systolic CHF (congestive heart failure) ([place])   . Pneumonia 06/2016    Current Outpatient Medications  Medication Sig Dispense Refill  . acetaminophen (TYLENOL) 325 MG tablet Take 2 tablets (650 mg total) by mouth every 4 (four) hours as needed for headache or mild pain.    Marland Kitchen amiodarone (PACERONE) 200 MG tablet Take 1 tablet (200 mg total) by mouth daily. Tome 1 pastilla (200 mg) una vez al dia. 30 tablet 5  . furosemide (LASIX) 20  MG tablet Take 1 tablet (20 mg total) by mouth as needed (weight gain/swelling in legs). Tome 1 pastilla como sea necesario para el aumento de peso 20 tablet 5  . gabapentin (NEURONTIN) 100 MG capsule Take 100 mg by mouth at bedtime.    Marland Kitchen losartan (COZAAR) 25 MG tablet Take 1 tablet (25 mg total) by mouth daily. 30 tablet 5  . pantoprazole (PROTONIX) 40 MG tablet Take 1 tablet (40 mg total) by mouth daily. Tome 1 pastilla un vez al dia 30 tablet 1  . spironolactone (ALDACTONE) 25 MG tablet Tome 1 pastilla un vez al dia (Patient not taking: Reported on 07/28/2017) 30 tablet 6   No current facility-administered medications for this encounter.    No Known Allergies  Social History   Socioeconomic History  . Marital status: Divorced    Spouse name: Not on file  . Number of children: Not on file  . Years of education: Not on file  . Highest education level: Not on file  Social Needs  . Financial resource strain: Not on file  . Food insecurity - worry: Not on file  . Food insecurity - inability: Not on file  . Transportation needs - medical: Not on file  . Transportation needs - non-medical: Not on file  Occupational History  . Not on file  Tobacco Use  . Smoking status: Never Smoker  . Smokeless tobacco: Never Used  Substance and Sexual  Activity  . Alcohol use: Yes    Comment: 04/15/2017 "might have a couple beers q couple months"  . Drug use: No  . Sexual activity: No  Other Topics Concern  . Not on file  Social History Narrative  . Not on file   Family History  Problem Relation Age of Onset  . Hypertension Mother    Vitals:   09/26/17 1445  BP: 110/86  Pulse: 89  SpO2: 100%  Weight: 145 lb 6.4 oz (66 kg)   Wt Readings from Last 3 Encounters:  09/26/17 145 lb 6.4 oz (66 kg)  09/12/17 137 lb (62.1 kg)  07/28/17 141 lb 6.4 oz (64.1 kg)   PHYSICAL EXAM: General: Well appearing. No resp difficulty. HEENT: Normal Neck: Supple. JVP 5-6. Carotids 2+ bilat; no bruits. No  thyromegaly or nodule noted. Cor: PMI nondisplaced. RRR, No M/G/R noted Lungs: CTAB, normal effort. Abdomen: Soft, non-tender, non-distended, no HSM. No bruits or masses. +BS  Extremities: No cyanosis, clubbing, or rash. R and LLE no edema.  Neuro: Alert & orientedx3, cranial nerves grossly intact. moves all 4 extremities w/o difficulty. Affect pleasant   General:  Well appearing. No resp difficulty HEENT: normal Neck: supple. no JVD. Carotids 2+ bilat; no bruits. No lymphadenopathy or thryomegaly appreciated. Cor: PMI nondisplaced. Regular rate & rhythm. No rubs, gallops or murmurs. Lungs: clear Abdomen: soft, nontender, nondistended. No hepatosplenomegaly. No bruits or masses. Good bowel sounds. Extremities: no cyanosis, clubbing, rash, edema Neuro: alert & orientedx3, cranial nerves grossly intact. moves all 4 extremities w/o difficulty. Affect pleasant  ASSESSMENT & PLAN: 1. Chronic systolic CHF: NICM, normal cors in 03/2017. 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 03/2017  EF 15-20% Echo 07/28/17 EF 25-30%  - NYHA II symptoms.  - Volume status stable on exam.   - Resume spiro 12.5  - Continue lasix 20 mg daily. Can cut back as needed.  - Continue losartan 25 mg qhs.  - Continue amiodarone 200 daily - Unable to get cMRI due to lack of insurance.  - Reinforced fluid restriction to < 2 L daily, sodium restriction to less than 2000 mg daily, and the importance of daily weights.    2. Frequent PVC's - Continue amiodarone 200 mg daily.  - EF improved as above.   Shirley Friar, PA-C 09/26/17   Greater than 50% of the 25 minute visit was spent in counseling/coordination of care regarding disease state education, salt/fluid restriction, sliding scale diuretics, and medication compliance.

## 2017-09-26 NOTE — Patient Instructions (Addendum)
Spironolactone: Tome 1/2 pastilla un vez a la hora de dormir  Follow up 3 weeks with CHF clinical pharmacist Ileene Patrick.  _________________________________________________________ Megan Keith Code: 9001  Follow up 6 weeks with Oda Kilts PA-C.  __________________________________________________________ Megan Keith Code: 9002  Take all medication as prescribed the day of your appointment. Bring all medications with you to your appointment.  Do the following things EVERYDAY: 1) Weigh yourself in the morning before breakfast. Write it down and keep it in a log. 2) Take your medicines as prescribed 3) Eat low salt foods-Limit salt (sodium) to 2000 mg per day.  4) Stay as active as you can everyday 5) Limit all fluids for the day to less than 2 liters

## 2017-10-14 ENCOUNTER — Ambulatory Visit (HOSPITAL_COMMUNITY): Payer: Self-pay

## 2017-10-18 ENCOUNTER — Ambulatory Visit (HOSPITAL_COMMUNITY): Payer: Self-pay

## 2017-10-18 ENCOUNTER — Other Ambulatory Visit (HOSPITAL_COMMUNITY): Payer: Self-pay | Admitting: *Deleted

## 2017-10-18 MED ORDER — AMIODARONE HCL 200 MG PO TABS: 200.0000 mg | ORAL_TABLET | Freq: Every day | ORAL | 5 refills | Status: DC

## 2017-10-19 ENCOUNTER — Ambulatory Visit (HOSPITAL_COMMUNITY)
Admission: RE | Admit: 2017-10-19 | Discharge: 2017-10-19 | Disposition: A | Discharge status: Home / Self Care | Payer: Self-pay | Source: Ambulatory Visit | Attending: Cardiology | Admitting: Cardiology

## 2017-10-19 VITALS — BP 110/72 | HR 85 | Wt 144.6 lb

## 2017-10-19 DIAGNOSIS — I420 Dilated cardiomyopathy: Secondary | ICD-10-CM

## 2017-10-19 DIAGNOSIS — Z79899 Other long term (current) drug therapy: Secondary | ICD-10-CM | POA: Insufficient documentation

## 2017-10-19 DIAGNOSIS — I428 Other cardiomyopathies: Secondary | ICD-10-CM | POA: Insufficient documentation

## 2017-10-19 DIAGNOSIS — I5022 Chronic systolic (congestive) heart failure: Secondary | ICD-10-CM | POA: Insufficient documentation

## 2017-10-19 DIAGNOSIS — I493 Ventricular premature depolarization: Secondary | ICD-10-CM | POA: Insufficient documentation

## 2017-10-19 LAB — BASIC METABOLIC PANEL
Anion gap: 10 (ref 5–15)
BUN: 14 mg/dL (ref 6–20)
CALCIUM: 9.2 mg/dL (ref 8.9–10.3)
CHLORIDE: 106 mmol/L (ref 101–111)
CO2: 23 mmol/L (ref 22–32)
Creatinine, Ser: 0.82 mg/dL (ref 0.44–1.00)
Glucose, Bld: 100 mg/dL — ABNORMAL HIGH (ref 65–99)
Potassium: 4.5 mmol/L (ref 3.5–5.1)
SODIUM: 139 mmol/L (ref 135–145)

## 2017-10-19 MED ORDER — SPIRONOLACTONE 25 MG PO TABS: ORAL_TABLET | ORAL | 11 refills | Status: DC

## 2017-10-19 MED ORDER — AMIODARONE HCL 200 MG PO TABS: 200.0000 mg | ORAL_TABLET | Freq: Every day | ORAL | 5 refills | Status: DC

## 2017-10-19 MED ORDER — LOSARTAN POTASSIUM 25 MG PO TABS: 12.5000 mg | ORAL_TABLET | Freq: Every day | ORAL | 5 refills | Status: DC

## 2017-10-19 NOTE — Progress Notes (Signed)
HF MD: Haroldine Laws  HPI:  Megan Keith is a 57 y.o. 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    She presented to [facility] on 04/13/17 with neck pain, SOB and orthopnea. Last EF reported by Dr. [name] was 35%. Repeat echo showed reduced EF of 15%, she was transferred to [facility] for further evaluation. Underwent right and left heart catheterization on 04/15/17, no CAD. Fick output/index 5.28/3.34. Full results below.   Noted to have >20% PVC on telemetry. She was started on IV Amio and transitioned to po Amio. Also started on digoxin and spiro prior to discharge. BP was too soft to add any other medications. Discharge weight was 143 pounds.   Spanish Interpreter present  She presents today for pharmacist-led HF medication titration. At last visit on 09/26/17, spironolactone 12.5 mg daily was resumed. Feeling good today although she states that she stopped amiodarone for a few days because it was causing a headache and hand trembling. She was taking it twice daily though instead of previously reduced dose of 1 tablet once daily. She also has not been taking losartan because she states that the pharmacy never got the Rx. She has only required lasix once over the past week 2/2 weight gain and LE edema. Since then her weight has been slowly increasing but unsure if this is diet related (she is usually between 136-140 and today at home she was 141 lb). She still gets occasionally SOB with walking 15-20 minutes. She denies lightheadedness or dizziness unless she hasn't eaten recently. No further palpitations.     . Shortness of breath/dyspnea on exertion? Yes - stable with exertion  . Orthopnea/PND? no . Edema? no . Lightheadedness/dizziness? Yes - only when hungry . Daily weights at home? Yes - stable 136-140 lb  . Blood pressure/heart rate monitoring at home? no . Following  low-sodium/fluid-restricted diet? Yes   HF Medications: Furosemide 20 mg PO PRN weight gain/LE edema Spironolactone 12.5 mg PO daily   Has the patient been experiencing any side effects to the medications prescribed?  no  Does the patient have any problems obtaining medications due to transportation or finances?   No - no insurance [facility]  Understanding of regimen: fair Understanding of indications: fair Potential of compliance: fair Patient understands to avoid NSAIDs. Patient understands to avoid decongestants.    Pertinent Lab Values: . 10/19/17: Serum creatinine 0.82, BUN 14, Potassium 4.5, Sodium 139  Vital Signs: . Weight: 144 lb (last HF clinic visit weight: 145 lb) . Blood pressure: 110/72 mmHg  . Heart rate: 85 bpm    Assessment: 1. Chronicsystolic CHF (EF 62-83%), due to NICM (?PVC CM). NYHA class IIsymptoms. - Volume status stable on exam.   - Resume losartan at 12.5 mg daily - Continue lasix 20 mg daily PRN weight gain or LE edema and spironolactone 12.5 mg daily - Reduce amiodarone to 200 mg ONCE daily - Unable to get cMRI due to lack of insurance.  - Basic disease state pathophysiology, medication indication, mechanism and side effects reviewed at length with patient and he verbalized understanding 2. Frequent PVC's - Reduce amiodarone to 200 mg daily as above - EF improved as above.   Plan: 1) Medication changes: Based on clinical presentation, vital signs and recent labs will reduce amiodarone to 200 mg daily and restart losartan at 12.5 mg daily 2) Labs: BMET today  3)  Follow-up: PA/NP on 11/04/17    Ruta Hinds. Velva Harman, PharmD, BCPS, CPP Clinical Pharmacist Pager: [phone] Phone: [phone] 10/19/2017 9:31 AM

## 2017-10-19 NOTE — Patient Instructions (Addendum)
Por favor disminuya su amiodarone a 1 tableta UNA VEZ AL DIA.  Por favor empieza a tomar losartan 1/2 tableta UNA VEZ AL DIA.   Analisis de [employer]. La llamamos con cambios.   Tiene una cita con Oda Kilts, PA-C en 11/04/17.

## 2017-11-04 ENCOUNTER — Ambulatory Visit (HOSPITAL_COMMUNITY)
Admission: RE | Admit: 2017-11-04 | Discharge: 2017-11-04 | Disposition: A | Discharge status: Home / Self Care | Payer: Self-pay | Source: Ambulatory Visit | Attending: Internal Medicine | Admitting: Internal Medicine

## 2017-11-04 ENCOUNTER — Encounter (HOSPITAL_COMMUNITY): Payer: Self-pay

## 2017-11-04 VITALS — BP 102/76 | HR 74 | Wt 143.0 lb

## 2017-11-04 DIAGNOSIS — I38 Endocarditis, valve unspecified: Secondary | ICD-10-CM

## 2017-11-04 DIAGNOSIS — I493 Ventricular premature depolarization: Secondary | ICD-10-CM

## 2017-11-04 DIAGNOSIS — I5022 Chronic systolic (congestive) heart failure: Secondary | ICD-10-CM

## 2017-11-04 DIAGNOSIS — I428 Other cardiomyopathies: Secondary | ICD-10-CM

## 2017-11-04 MED ORDER — SPIRONOLACTONE 25 MG PO TABS: 12.5000 mg | ORAL_TABLET | Freq: Every day | ORAL | 3 refills | Status: DC

## 2017-11-04 MED ORDER — AMIODARONE HCL 200 MG PO TABS: 200.0000 mg | ORAL_TABLET | Freq: Every day | ORAL | 11 refills | Status: DC

## 2017-11-04 NOTE — Progress Notes (Signed)
Advanced Heart Failure Clinic Note    Primary Care: No PCP Primary Cardiologist: Dr. Haroldine Laws, Dr. [name] in [place]   HPI: Megan Keith is a 57 y.o. 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    She presented to [facility] on 04/13/17 with neck pain, SOB and orthopnea. Last EF reported by Dr. [name] was 35%. Repeat echo showed reduced EF of 15%, she was transferred to [facility] for further evaluation. Underwent right and left heart catheterization on 04/15/17, no CAD. Fick output/index 5.28/3.34. Full results below.   Noted to have >20% PVC on telemetry. She was started on IV Amio and transitioned to po Amio. Also started on digoxin and spiro prior to discharge. BP was too soft to add any other medications. Discharge weight was 143 pounds.   Spanish Interpreter present today.   Today he returns for HF follow up. Last visit spiro was added however she never picked it up. She is also taking amio twice a day instead of once a day. Says she has breast cancer and may need surgery. Overall feeling ok. SOB with steps. Denies PND/Orthopnea. Appetite ok. No fever or chills. Weight at home stable. Her daughter speaks english.   Echo 07/2017 EF 25-30%   Past Medical History:  Diagnosis Date  . Anemia   . Arthritis    "knees, hands" (04/15/2017)  . Chronic knee pain   . Chronic systolic CHF (congestive heart failure) ([place])   . Pneumonia 06/2016    Current Outpatient Medications  Medication Sig Dispense Refill  . acetaminophen (TYLENOL) 325 MG tablet Take 2 tablets (650 mg total) by mouth every 4 (four) hours as needed for headache or mild pain.    Marland Kitchen amiodarone (PACERONE) 200 MG tablet Take 1 tablet (200 mg total) by mouth daily. Tome 1 tableta una vez al dia. 30 tablet 5  . furosemide (LASIX) 20 MG tablet Take 1 tablet (20 mg total) by mouth as needed (weight gain/swelling in legs). Tome 1  pastilla como sea necesario para el aumento de peso 20 tablet 5  . gabapentin (NEURONTIN) 100 MG capsule Take 100 mg by mouth at bedtime.    Marland Kitchen losartan (COZAAR) 25 MG tablet Take 0.5 tablets (12.5 mg total) by mouth daily. 15 tablet 5  . pantoprazole (PROTONIX) 40 MG tablet Take 1 tablet (40 mg total) by mouth daily. Tome 1 pastilla un vez al dia 30 tablet 1  . spironolactone (ALDACTONE) 25 MG tablet Tome 1/2 pastilla un vez a la hora de dormir 15 tablet 11   No current facility-administered medications for this encounter.    No Known Allergies  Social History   Socioeconomic History  . Marital status: Divorced    Spouse name: Not on file  . Number of children: Not on file  . Years of education: Not on file  . Highest education level: Not on file  Social Needs  . Financial resource strain: Not on file  . Food insecurity - worry: Not on file  . Food insecurity - inability: Not on file  . Transportation needs - medical: Not on file  . Transportation needs - non-medical: Not on file  Occupational History  . Not on file  Tobacco Use  . Smoking status: Never Smoker  . Smokeless tobacco: Never Used  Substance and Sexual Activity  . Alcohol use: Yes    Comment: 04/15/2017 "might have a couple beers q  couple months"  . Drug use: No  . Sexual activity: No  Other Topics Concern  . Not on file  Social History Narrative  . Not on file   Family History  Problem Relation Age of Onset  . Hypertension Mother    Vitals:   11/04/17 1023  BP: 102/76  Pulse: 74  SpO2: 100%  Weight: 143 lb (64.9 kg)      Wt Readings from Last 3 Encounters:  11/04/17 143 lb (64.9 kg)  10/19/17 144 lb 9.6 oz (65.6 kg)  09/26/17 145 lb 6.4 oz (66 kg)   PHYSICAL EXAM: General:  Well appearing. No resp difficulty HEENT: normal Neck: supple. no JVD. Carotids 2+ bilat; no bruits. No lymphadenopathy or thryomegaly appreciated. Cor: PMI nondisplaced. Regular rate & rhythm. No rubs, gallops or  murmurs. Lungs: clear Abdomen: soft, nontender, nondistended. No hepatosplenomegaly. No bruits or masses. Good bowel sounds. Extremities: no cyanosis, clubbing, rash, edema Neuro: alert & orientedx3, cranial nerves grossly intact. moves all 4 extremities w/o difficulty. Affect pleasant  ASSESSMENT & PLAN: 1. Chronic systolic CHF: NICM, normal cors in 03/2017. 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 03/2017  EF 15-20% Echo 07/28/17 EF 25-30%  NYHA II. - Volume status stable on exam.   - Reordered spiro today.  - Continue lasix 20 mg daily. Can cut back as needed.  - Continue losartan 25 mg qhs.  - Continue amiodarone 200 daily - Unable to get cMRI due to lack of insurance.   2. Frequent PVC's - Continue amiodarone 200 mg daily.   3. Left Breast Cancer.   Today I personally filled her pill box. HF Pharmacist called the pharmacy and demonstrated how to refill medications.   Greater than 50% of the (total minutes 25) visit spent in counseling/coordination of care regarding medications and how to use pill box.     Follow up in 4 weeks.   Darrick Grinder, NP 11/04/17

## 2017-11-04 NOTE — Patient Instructions (Addendum)
Por favor disminuya su amiodarone a 1 tableta UNA VEZ AL DIA.   Por favor empieza spironolactone 1/2 tableta UNA VEZ AL DIA.   Tiene una cita con Dr. Haroldine Laws in 4 semanas.

## 2017-12-08 ENCOUNTER — Other Ambulatory Visit: Payer: Self-pay

## 2017-12-08 ENCOUNTER — Encounter (HOSPITAL_COMMUNITY): Payer: Self-pay | Admitting: Internal Medicine

## 2017-12-08 ENCOUNTER — Ambulatory Visit (HOSPITAL_COMMUNITY)
Admission: RE | Admit: 2017-12-08 | Discharge: 2017-12-08 | Disposition: A | Discharge status: Home / Self Care | Payer: Self-pay | Source: Ambulatory Visit | Attending: Internal Medicine | Admitting: Internal Medicine

## 2017-12-08 VITALS — BP 111/58 | HR 78 | Wt 147.1 lb

## 2017-12-08 DIAGNOSIS — R0602 Shortness of breath: Secondary | ICD-10-CM | POA: Insufficient documentation

## 2017-12-08 DIAGNOSIS — R9431 Abnormal electrocardiogram [ECG] [EKG]: Secondary | ICD-10-CM | POA: Insufficient documentation

## 2017-12-08 DIAGNOSIS — Z79899 Other long term (current) drug therapy: Secondary | ICD-10-CM | POA: Insufficient documentation

## 2017-12-08 DIAGNOSIS — I429 Cardiomyopathy, unspecified: Secondary | ICD-10-CM | POA: Insufficient documentation

## 2017-12-08 DIAGNOSIS — G8929 Other chronic pain: Secondary | ICD-10-CM | POA: Insufficient documentation

## 2017-12-08 DIAGNOSIS — I493 Ventricular premature depolarization: Secondary | ICD-10-CM | POA: Insufficient documentation

## 2017-12-08 DIAGNOSIS — I5022 Chronic systolic (congestive) heart failure: Secondary | ICD-10-CM | POA: Insufficient documentation

## 2017-12-08 DIAGNOSIS — M7989 Other specified soft tissue disorders: Secondary | ICD-10-CM | POA: Insufficient documentation

## 2017-12-08 DIAGNOSIS — C50912 Malignant neoplasm of unspecified site of left female breast: Secondary | ICD-10-CM | POA: Insufficient documentation

## 2017-12-08 DIAGNOSIS — M25569 Pain in unspecified knee: Secondary | ICD-10-CM | POA: Insufficient documentation

## 2017-12-08 LAB — BASIC METABOLIC PANEL
Anion gap: 9 (ref 5–15)
BUN: 16 mg/dL (ref 6–20)
CALCIUM: 9.2 mg/dL (ref 8.9–10.3)
CO2: 23 mmol/L (ref 22–32)
CREATININE: 0.7 mg/dL (ref 0.44–1.00)
Chloride: 106 mmol/L (ref 101–111)
GLUCOSE: 104 mg/dL — AB (ref 65–99)
Potassium: 4.1 mmol/L (ref 3.5–5.1)
Sodium: 138 mmol/L (ref 135–145)

## 2017-12-08 MED ORDER — LOSARTAN POTASSIUM 25 MG PO TABS: 25.0000 mg | ORAL_TABLET | Freq: Every day | ORAL | 5 refills | Status: DC

## 2017-12-08 MED ORDER — SPIRONOLACTONE 25 MG PO TABS: 12.5000 mg | ORAL_TABLET | Freq: Every day | ORAL | 3 refills | Status: DC

## 2017-12-08 NOTE — Progress Notes (Signed)
ReDS Vest - 12/08/17 1500      ReDS Vest   MR   No    Fitting Posture  Standing    Height Marker  Short    Ruler Value  13    Center Strip  Aligned    ReDS Value  25

## 2017-12-08 NOTE — Progress Notes (Signed)
Advanced Heart Failure Clinic Note    Primary Care: No PCP Primary Cardiologist: Dr. Haroldine Laws, Dr. [name] in [place]   HPI: Dashia Caldeira is a 57 y.o. 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    She presented to [facility] on 04/13/17 with neck pain, SOB and orthopnea. Last EF reported by Dr. [name] was 35%. Repeat echo showed reduced EF of 15%, she was transferred to [facility] for further evaluation. Underwent right and left heart catheterization on 04/15/17, no CAD. Fick output/index 5.28/3.34. Full results below.   Noted to have >20% PVC on telemetry. She was started on IV Amio and transitioned to po Amio. Also started on digoxin and spiro prior to discharge. BP was too soft to add any other medications. Discharge weight was 143 pounds.   Spanish Interpreter present today.   Returns for HF follow up. Says she is getting better slowly. Can go shopping but has to hold on to cart. If she walks too fast gets SOB. No SOB at rest. No palpitations. Anxious at times. No edema, orthopnea or PND. Says she has trouble sleeping and seems anxious.   Echo 07/2017 EF 25-30%   Past Medical History:  Diagnosis Date  . Anemia   . Arthritis    "knees, hands" (04/15/2017)  . Chronic knee pain   . Chronic systolic CHF (congestive heart failure) ([place])   . Pneumonia 06/2016    Current Outpatient Medications  Medication Sig Dispense Refill  . acetaminophen (TYLENOL) 325 MG tablet Take 2 tablets (650 mg total) by mouth every 4 (four) hours as needed for headache or mild pain.    Marland Kitchen amiodarone (PACERONE) 200 MG tablet Take 1 tablet (200 mg total) by mouth daily. 30 tablet 11  . furosemide (LASIX) 20 MG tablet Take 1 tablet (20 mg total) by mouth as needed (weight gain/swelling in legs). Tome 1 pastilla como sea necesario para el aumento de peso 20 tablet 5  . gabapentin (NEURONTIN) 100 MG capsule Take  100 mg by mouth at bedtime.    Marland Kitchen losartan (COZAAR) 25 MG tablet Take 0.5 tablets (12.5 mg total) by mouth daily. 15 tablet 5  . pantoprazole (PROTONIX) 40 MG tablet Take 1 tablet (40 mg total) by mouth daily. Tome 1 pastilla un vez al dia 30 tablet 1  . spironolactone (ALDACTONE) 25 MG tablet Take 0.5 tablets (12.5 mg total) by mouth daily. Tome 1/2 pastilla un vez a la hora de dormir 45 tablet 3   No current facility-administered medications for this encounter.    No Known Allergies  Social History   Socioeconomic History  . Marital status: Divorced    Spouse name: Not on file  . Number of children: Not on file  . Years of education: Not on file  . Highest education level: Not on file  Occupational History  . Not on file  Social Needs  . Financial resource strain: Not on file  . Food insecurity:    Worry: Not on file    Inability: Not on file  . Transportation needs:    Medical: Not on file    Non-medical: Not on file  Tobacco Use  . Smoking status: Never Smoker  . Smokeless tobacco: Never Used  Substance and Sexual Activity  . Alcohol use: Yes    Comment: 04/15/2017 "might have a couple beers q couple months"  . Drug use: No  . Sexual  activity: Never  Lifestyle  . Physical activity:    Days per week: Not on file    Minutes per session: Not on file  . Stress: Not on file  Relationships  . Social connections:    Talks on phone: Not on file    Gets together: Not on file    Attends religious service: Not on file    Active member of club or organization: Not on file    Attends meetings of clubs or organizations: Not on file    Relationship status: Not on file  . Intimate partner violence:    Fear of current or ex partner: Not on file    Emotionally abused: Not on file    Physically abused: Not on file    Forced sexual activity: Not on file  Other Topics Concern  . Not on file  Social History Narrative  . Not on file   Family History  Problem Relation Age of  Onset  . Hypertension Mother    Vitals:   12/08/17 1449  BP: (!) 111/58  Pulse: 78  SpO2: 100%  Weight: 147 lb 1.9 oz (66.7 kg)      Wt Readings from Last 3 Encounters:  12/08/17 147 lb 1.9 oz (66.7 kg)  11/04/17 143 lb (64.9 kg)  10/19/17 144 lb 9.6 oz (65.6 kg)   PHYSICAL EXAM: General:  Well appearing. No resp difficulty HEENT: normal Neck: supple. no JVD. Carotids 2+ bilat; no bruits. No lymphadenopathy or thryomegaly appreciated. Cor: PMI nondisplaced. Regular rate & rhythm. No rubs, gallops or murmurs. Lungs: clear Abdomen: soft, nontender, nondistended. No hepatosplenomegaly. No bruits or masses. Good bowel sounds. Extremities: no cyanosis, clubbing, rash, edema Neuro: alert & orientedx3, cranial nerves grossly intact. moves all 4 extremities w/o difficulty. Affect pleasant  ASSESSMENT & PLAN: 1. Chronic systolic CHF: NICM, normal cors in 03/2017. 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 03/2017  EF 15-20% Echo 07/28/17 EF 25-30%  - Doing well. HF well controlled. NYHA II. Suspect dyspnea maybe related to anxiety. Will need to watch for amio toxicity though.  - Volume status stable on exam and with ReDS (25%) - Continue spiro 12.5 daily  - Continue lasix 20 mg daily. Can cut back as needed.  - Increase losartan to 25 mg qhs.  - Continue amiodarone 200 daily - No b-blocker yet  - Unable to get cMRI due to lack of insurance.  - Repeat echo to re-evaluate EF now that PVCs suppressed  2. Frequent PVC's - Continue amiodarone 200 mg daily.  - No PVCs on exam today - If EF improves with PVC suppression. Will consider switch to flecainide to avoid long-term amio use.   3. Left Breast Cancer.    Glori Bickers, MD 12/08/17

## 2017-12-08 NOTE — Patient Instructions (Addendum)
Aumente losartn a 25 mg al da a la hora de acostarse  Laboratorios hechos hoy  Su mdico le ha pedido que se haga un Lawyer. La ecocardiografa es una prueba indolora que [place] ondas de sonido para crear imgenes de su corazn. Le brinda a su mdico informacin sobre el tamao y la forma de su corazn y qu tan bien estn funcionando las cmaras y vlvulas de su corazn. Este procedimiento dura aproximadamente una hora. No hay restricciones para este procedimient  Su mdico recomienda que programe una cita de seguimiento en: 3 meses

## 2017-12-12 DIAGNOSIS — Z79811 Long term (current) use of aromatase inhibitors: Secondary | ICD-10-CM

## 2017-12-12 DIAGNOSIS — Z170 Estrogen receptor positive status [ER+]: Secondary | ICD-10-CM

## 2017-12-12 DIAGNOSIS — C50412 Malignant neoplasm of upper-outer quadrant of left female breast: Secondary | ICD-10-CM

## 2017-12-15 ENCOUNTER — Ambulatory Visit (HOSPITAL_COMMUNITY)
Admission: RE | Admit: 2017-12-15 | Discharge: 2017-12-15 | Disposition: A | Discharge status: Home / Self Care | Payer: Self-pay | Source: Ambulatory Visit | Attending: Internal Medicine | Admitting: Internal Medicine

## 2017-12-15 DIAGNOSIS — I420 Dilated cardiomyopathy: Secondary | ICD-10-CM | POA: Insufficient documentation

## 2017-12-15 DIAGNOSIS — I5022 Chronic systolic (congestive) heart failure: Secondary | ICD-10-CM | POA: Insufficient documentation

## 2017-12-15 DIAGNOSIS — I272 Pulmonary hypertension, unspecified: Secondary | ICD-10-CM | POA: Insufficient documentation

## 2017-12-15 NOTE — Progress Notes (Signed)
  Echocardiogram 2D Echocardiogram has been performed.  Megan Keith 12/15/2017, 2:52 PM

## 2018-02-05 ENCOUNTER — Inpatient Hospital Stay (HOSPITAL_COMMUNITY)
Admission: AD | Admit: 2018-02-05 | Discharge: 2018-02-05 | DRG: 103 | Disposition: A | Discharge status: Home / Self Care | Payer: Self-pay | Source: Other Acute Inpatient Hospital | Attending: Family Medicine | Admitting: Family Medicine

## 2018-02-05 ENCOUNTER — Inpatient Hospital Stay (HOSPITAL_COMMUNITY): Payer: Self-pay

## 2018-02-05 DIAGNOSIS — I110 Hypertensive heart disease with heart failure: Secondary | ICD-10-CM | POA: Diagnosis present

## 2018-02-05 DIAGNOSIS — J029 Acute pharyngitis, unspecified: Secondary | ICD-10-CM | POA: Diagnosis present

## 2018-02-05 DIAGNOSIS — R8290 Unspecified abnormal findings in urine: Secondary | ICD-10-CM | POA: Diagnosis present

## 2018-02-05 DIAGNOSIS — I5023 Acute on chronic systolic (congestive) heart failure: Secondary | ICD-10-CM | POA: Diagnosis present

## 2018-02-05 DIAGNOSIS — I493 Ventricular premature depolarization: Secondary | ICD-10-CM | POA: Diagnosis present

## 2018-02-05 DIAGNOSIS — M19041 Primary osteoarthritis, right hand: Secondary | ICD-10-CM | POA: Diagnosis present

## 2018-02-05 DIAGNOSIS — G8929 Other chronic pain: Secondary | ICD-10-CM | POA: Diagnosis present

## 2018-02-05 DIAGNOSIS — M170 Bilateral primary osteoarthritis of knee: Secondary | ICD-10-CM | POA: Diagnosis present

## 2018-02-05 DIAGNOSIS — G4459 Other complicated headache syndrome: Secondary | ICD-10-CM

## 2018-02-05 DIAGNOSIS — M19042 Primary osteoarthritis, left hand: Secondary | ICD-10-CM | POA: Diagnosis present

## 2018-02-05 DIAGNOSIS — C50919 Malignant neoplasm of unspecified site of unspecified female breast: Secondary | ICD-10-CM | POA: Diagnosis present

## 2018-02-05 DIAGNOSIS — K219 Gastro-esophageal reflux disease without esophagitis: Secondary | ICD-10-CM | POA: Diagnosis present

## 2018-02-05 DIAGNOSIS — R1013 Epigastric pain: Secondary | ICD-10-CM | POA: Diagnosis present

## 2018-02-05 DIAGNOSIS — I5022 Chronic systolic (congestive) heart failure: Secondary | ICD-10-CM | POA: Diagnosis present

## 2018-02-05 DIAGNOSIS — Z79899 Other long term (current) drug therapy: Secondary | ICD-10-CM

## 2018-02-05 DIAGNOSIS — R519 Headache, unspecified: Secondary | ICD-10-CM | POA: Diagnosis present

## 2018-02-05 DIAGNOSIS — R51 Headache: Secondary | ICD-10-CM

## 2018-02-05 DIAGNOSIS — Z8249 Family history of ischemic heart disease and other diseases of the circulatory system: Secondary | ICD-10-CM

## 2018-02-05 DIAGNOSIS — R112 Nausea with vomiting, unspecified: Secondary | ICD-10-CM | POA: Diagnosis present

## 2018-02-05 DIAGNOSIS — G43909 Migraine, unspecified, not intractable, without status migrainosus: Principal | ICD-10-CM | POA: Diagnosis present

## 2018-02-05 LAB — BASIC METABOLIC PANEL
ANION GAP: 9 (ref 5–15)
BUN: 15 mg/dL (ref 6–20)
CALCIUM: 9 mg/dL (ref 8.9–10.3)
CO2: 19 mmol/L — ABNORMAL LOW (ref 22–32)
Chloride: 112 mmol/L — ABNORMAL HIGH (ref 101–111)
Creatinine, Ser: 0.62 mg/dL (ref 0.44–1.00)
GFR calc non Af Amer: >60 mL/min (ref >60)
Glucose, Bld: 154 mg/dL — ABNORMAL HIGH (ref 65–99)
POTASSIUM: 3.7 mmol/L (ref 3.5–5.1)
Sodium: 140 mmol/L (ref 135–145)

## 2018-02-05 LAB — CBC
HEMATOCRIT: 41.4 % (ref 36.0–46.0)
HEMOGLOBIN: 13.6 g/dL (ref 12.0–15.0)
MCH: 31.9 pg (ref 26.0–34.0)
MCHC: 32.9 g/dL (ref 30.0–36.0)
MCV: 97 fL (ref 78.0–100.0)
Platelets: 263 10*3/uL (ref 150–400)
RBC: 4.27 MIL/uL (ref 3.87–5.11)
RDW: 13.6 % (ref 11.5–15.5)
WBC: 10.5 10*3/uL (ref 4.0–10.5)

## 2018-02-05 LAB — BRAIN NATRIURETIC PEPTIDE: B NATRIURETIC PEPTIDE 5: 822.4 pg/mL — AB (ref 0.0–100.0)

## 2018-02-05 LAB — LIPASE, BLOOD: Lipase: 25 U/L (ref 11–51)

## 2018-02-05 LAB — PROTIME-INR
INR: 1.01
PROTHROMBIN TIME: 13.2 s (ref 11.4–15.2)

## 2018-02-05 MED ORDER — AMIODARONE HCL 200 MG PO TABS
200.0000 mg | ORAL_TABLET | Freq: Every day | ORAL | Status: DC
  Administered 2018-02-05: 200 mg via ORAL
  Filled 2018-02-05: qty 1

## 2018-02-05 MED ORDER — LOSARTAN POTASSIUM 25 MG PO TABS
25.0000 mg | ORAL_TABLET | Freq: Every day | ORAL | Status: DC
  Filled 2018-02-05: qty 1

## 2018-02-05 MED ORDER — FAMOTIDINE IN NACL 20-0.9 MG/50ML-% IV SOLN
20.0000 mg | Freq: Two times a day (BID) | INTRAVENOUS | Status: DC
  Administered 2018-02-05: 20 mg via INTRAVENOUS
  Filled 2018-02-05: qty 50

## 2018-02-05 MED ORDER — GADOBENATE DIMEGLUMINE 529 MG/ML IV SOLN
13.0000 mL | Freq: Once | INTRAVENOUS | Status: AC
  Administered 2018-02-05: 13 mL via INTRAVENOUS

## 2018-02-05 MED ORDER — MAGNESIUM SULFATE 2 GM/50ML IV SOLN
2.0000 g | Freq: Once | INTRAVENOUS | Status: AC
  Administered 2018-02-05: 2 g via INTRAVENOUS
  Filled 2018-02-05: qty 50

## 2018-02-05 MED ORDER — OXYCODONE HCL 5 MG PO TABS: 5.0000 mg | ORAL_TABLET | Freq: Four times a day (QID) | ORAL | Status: DC | PRN

## 2018-02-05 MED ORDER — SODIUM CHLORIDE 0.9 % IV SOLN
INTRAVENOUS | Status: DC
  Administered 2018-02-05: 05:00:00 via INTRAVENOUS

## 2018-02-05 MED ORDER — VALPROATE SODIUM 500 MG/5ML IV SOLN
500.0000 mg | Freq: Once | INTRAVENOUS | Status: AC
  Administered 2018-02-05: 500 mg via INTRAVENOUS
  Filled 2018-02-05: qty 5

## 2018-02-05 MED ORDER — OXYCODONE HCL 5 MG PO TABS: 5.0000 mg | ORAL_TABLET | Freq: Four times a day (QID) | ORAL | 0 refills | Status: DC | PRN

## 2018-02-05 MED ORDER — PROCHLORPERAZINE EDISYLATE 10 MG/2ML IJ SOLN
10.0000 mg | Freq: Once | INTRAMUSCULAR | Status: AC
  Administered 2018-02-05: 10 mg via INTRAVENOUS
  Filled 2018-02-05: qty 2

## 2018-02-05 MED ORDER — ONDANSETRON HCL 4 MG/2ML IJ SOLN: 4.0000 mg | Freq: Three times a day (TID) | INTRAMUSCULAR | Status: DC | PRN

## 2018-02-05 MED ORDER — ACETAMINOPHEN 325 MG PO TABS: 650.0000 mg | ORAL_TABLET | Freq: Four times a day (QID) | ORAL | Status: DC | PRN

## 2018-02-05 MED ORDER — VALPROATE SODIUM 100 MG/ML IV SOLN: 500.0000 mg | Freq: Once | INTRAVENOUS | Status: DC

## 2018-02-05 MED ORDER — PANTOPRAZOLE SODIUM 40 MG PO TBEC
40.0000 mg | DELAYED_RELEASE_TABLET | Freq: Every day | ORAL | Status: DC
  Administered 2018-02-05: 40 mg via ORAL
  Filled 2018-02-05: qty 1

## 2018-02-05 MED ORDER — ONDANSETRON HCL 4 MG PO TABS: 4.0000 mg | ORAL_TABLET | Freq: Three times a day (TID) | ORAL | 1 refills | Status: AC | PRN

## 2018-02-05 MED ORDER — HYDRALAZINE HCL 20 MG/ML IJ SOLN: 5.0000 mg | INTRAMUSCULAR | Status: DC | PRN

## 2018-02-05 MED ORDER — FUROSEMIDE 20 MG PO TABS: 20.0000 mg | ORAL_TABLET | Freq: Every day | ORAL | 5 refills | Status: DC

## 2018-02-05 MED ORDER — ZOLPIDEM TARTRATE 5 MG PO TABS: 5.0000 mg | ORAL_TABLET | Freq: Every evening | ORAL | Status: DC | PRN

## 2018-02-05 MED ORDER — PANTOPRAZOLE SODIUM 40 MG PO TBEC: 40.0000 mg | DELAYED_RELEASE_TABLET | Freq: Every day | ORAL | 0 refills | Status: DC

## 2018-02-05 MED ORDER — SPIRONOLACTONE 12.5 MG HALF TABLET: 12.5000 mg | ORAL_TABLET | Freq: Every day | ORAL | Status: DC

## 2018-02-05 NOTE — Consult Note (Signed)
Neurology Consultation Reason for Consult: Headache Referring Physician: Mora Bellman  CC: Headache  History is obtained from:patient  HPI: Megan Keith is a 57 y.o. female with a headache for two days that started initially while she was receiving radiation for her breast cancer. She describes it as unilateral, affecting the left side, stabbing in quality, not associated with photophobia but is associated with nausea. She describes it as worse with lying down, better with standing.   She has also had numbness/burning of the left side during this time frame. She complains predominantly of numbness at the current time. She also has been seeing flashes like starbursts and occasionally having whitish obscuration of her left field.   LKW: two days ago.  tpa given?: no, out of window  ROS: A 14 point ROS was performed and is negative except as noted in the HPI.   Past Medical History:  Diagnosis Date  . Anemia   . Arthritis    "knees, hands" (04/15/2017)  . Chronic knee pain   . Chronic systolic CHF (congestive heart failure) ([place])   . Pneumonia 06/2016     Family History  Problem Relation Age of Onset  . Hypertension Mother      Social History:  reports that she has never smoked. She has never used smokeless tobacco. She reports that she drinks alcohol. She reports that she does not use drugs.   Exam: Current vital signs: BP 100/74 (BP Location: Right Arm)   Pulse 67   Temp 98.1 F (36.7 C) (Oral)   Resp 16   SpO2 100%  Vital signs in last 24 hours: Temp:  [98.1 F (36.7 C)] 98.1 F (36.7 C) (06/16 0200) Pulse Rate:  [67] 67 (06/16 0200) Resp:  [16] 16 (06/16 0200) BP: (100)/(74) 100/74 (06/16 0200) SpO2:  [100 %] 100 % (06/16 0200)   Physical Exam  Constitutional: Appears well-developed and well-nourished.  Psych: Affect appropriate to situation Eyes: No scleral injection HENT: No OP obstrucion Head: Normocephalic.  Cardiovascular: Normal rate and regular  rhythm.  Respiratory: Effort normal, non-labored breathing GI: Soft.  No distension. There is no tenderness.  Skin: WDI  Neuro: Mental Status: Patient is awake, alert, oriented to person, place, month, and situation. Gives year as 2018 Patient is able to give a clear and coherent history. No signs of aphasia or neglect Cranial Nerves: II: Visual Fields are full. Pupils are equal, round, and reactive to light.   III,IV, VI: EOMI without ptosis or diploplia.  V: Facial sensation is decreased on the left to temperature and touch VII: Facial movement is symmetric.  VIII: hearing is intact to voice X: Uvula elevates symmetrically XI: Shoulder shrug is symmetric. XII: tongue is midline without atrophy or fasciculations.  Motor: Tone is normal. Bulk is normal. 5/5 strength was present in all four extremities.  No drift Sensory: Sensation is decreased throughout the left side Cerebellar: No ataxia on finger-nose-finger bilaterally  I have reviewed labs in epic and the results pertinent to this consultation are: BMP-creatinine 0.6  I have reviewed the images obtained: CTA-no acute findings  Impression: 57 year old female with gradual onset worst headache of her life.  Given the flashing lights and burning sensation, I think that migraine is a significant possibility.  She does have some concerning findings of headache being worse with laying down and with her cancer, I do think that venous sinus thrombosis has to be considered and therefore an MRV/MRI would be prudent.    With a gradual  onset, I think the subarachnoid hemorrhage is much less likely.  Recommendations: 1) MRI/MRV 2) Depacon/magnesium/Compazine for headache 3) further recommendations pending response to medication and imaging.   Roland Rack, MD [facility] [phone]  If 7pm- 7am, please page neurology on call as listed in [place].

## 2018-02-05 NOTE — Progress Notes (Addendum)
Subjective: Patient speaks Spanish however daughters in the room and is able to interpret.  Patient states that headache has much improved and is very negligible now.  Exam: Vitals:   02/05/18 0200 02/05/18 0506  BP: 100/74 92/66  Pulse: 67 74  Resp: 16 20  Temp: 98.1 F (36.7 C) 97.8 F (36.6 C)  SpO2: 100% 99%    Physical Exam   HEENT-  Normocephalic, no lesions, without obvious abnormality.  Normal external eye and conjunctiva.  r Extremities- Warm, dry and intact Musculoskeletal-no joint tenderness, deformity or swelling Skin-warm and dry, no hyperpigmentation, vitiligo, or suspicious lesions Neuro:  Mental Status: Alert, oriented, thought content appropriate.  Speech fluent without evidence of aphasia.  Able to follow 3 step commands without difficulty--With interpretation by daughter Cranial Nerves: II:  Visual fields grossly normal,  III,IV, VI: ptosis not present, extra-ocular motions intact bilaterally pupils equal, round, reactive to light and accommodation V,VII: smile symmetric, facial light touch sensation normal bilaterally VIII: hearing normal bilaterally  Motor: Right : Upper extremity   5/5    Left:     Upper extremity   5/5  Lower extremity   5/5     Lower extremity   5/5 Tone and bulk:normal tone throughout; no atrophy noted Sensory: Improved Deep Tendon Reflexes: 2+ and symmetric throughout Cerebellar: normal finger-to-nose, normal rapid alternating movements and normal heel-to-shin test   Medications:  Scheduled: . amiodarone  200 mg Oral Daily  . losartan  25 mg Oral QHS  . pantoprazole  40 mg Oral Daily     Pertinent Labs/Diagnostics: Awaiting MRI and MRV   Etta Quill PA-C [facility] [phone]  Attending Neurohospitalist Addendum Patient seen and examined with APP/Resident. Agree with the history and physical as documented above. Agree with the plan as documented, which I helped formulate. I have independently reviewed  the chart, obtained history, review of systems and examined the patient.I have personally reviewed pertinent head/neck/spine imaging (CT/MRI).  Assessment:57 year old female with gradual onset worst headache of her life.  Given the flashing lights and burning sensation, I think that migraine is a significant possibility.  She does have some concerning findings of headache being worse with laying down and with her cancer, I do think that venous sinus thrombosis has to be considered and therefore an MRV/MRI would be prudent.    But given the fact that symptoms have improved with migraine cocktail, complicated migraine is high on the differential.  That said will obtain MRV and MRI.  Impression:  Complex migraine Rule out dural venous sinus thrombosis versus mets to the brain  Recommendations: MRI/MRV of the brain Symptomatic treatment of headache per primary team We will follow-up imaging. If the imaging is unremarkable, no further neurological recommendations at this time.  02/05/2018, 8:35 AM  Please feel free to call with any questions. --- Amie Portland, MD [facility] Pager: [phone] If 7pm to 7am, please call on call as listed on [facility].

## 2018-02-05 NOTE — Discharge Summary (Addendum)
Physician Discharge Summary  Megan Keith BTD:[id number] DOB: 11/10/1960 DOA: 02/05/2018  PCP: Aldona Bar, MD  Admit date: 02/05/2018 Discharge date: 02/05/2018  Time spent: 25* minutes  Recommendations for Outpatient Follow-up:  1. Follow-up PCP in 2 weeks   Discharge Diagnoses:  Principal Problem:   Headache Active Problems:   Chronic systolic CHF (congestive heart failure) (HCC)   Frequent PVCs   GERD (gastroesophageal reflux disease)   Breast cancer (HCC)   Nausea & vomiting   Epigastric abdominal pain   Sore throat   Discharge Condition: Stable  Diet recommendation: heart healthy diet   History of present illness:  57 y.o. female with medical history significant of sCHF with EF 25%, GERD, frequent PVC, breast cancer (lumpectomy 11/2017, on radiation therapy, not on chemotherapy), left ventricular thrombus, DCM, who presents with headache, epigastric abdominal pain.  Patient speaks Spanish, history is obtained with help from her granddaughter.  Patient states that her headache started on Friday morning, which has been progressively getting worse.  The headache is located in the left side of the head, throbbing pain, currently 10 out of 10 severity, nonradiating.  It is associated with some mild left facial numbness and blurry vision bilaterally.  Patient does not have unilateral weakness in extremities, no slurred speech or hearing loss.  Patient also reports some epigastric abdominal pain, which is dull, constant, nonradiating.  Associated with nausea and vomiting for few times.  No diarrhea.  Patient does not have fever or chills.  Patient also reports sore throat, no runny nose, cough, chest pain.  Denies symptoms of UTI.  Patient was initially evaluated in [facility], patient had a negative CT head for acute intracranial abnormalities.  CT angiogram of the neck and head showed no LVO. Telemetry neuro was consulted in [facility], recommended to get   MRI, MRA, MRV and then LP to r/o subarachnoid hemorrhage. Per her granddaughter, patient is only taking 3 medications, including losartan, amiodarone and spironolactone currently.      Hospital Course:   Headache-resolved, patient was seen by neurology underwent MRI brain, MRV which was negative.  At this time neurology has signed off.  Patient will be discharged on Tylenol as needed, oxycodone as needed.  Abnormal UA-abnormal UA was mentioned in the H&P but patient is asymptomatic, will not treat for UTI at this time.    Chronic systolic CHF-BNP was elevated 899, reviewed the notes from Dr. Haroldine Laws from December 15, 2017, patient was supposed to continue with Lasix 20 mg daily Aldactone 12.5 mg daily, losartan 25 mg daily.  Will give prescription for Lasix 20 mg p.o. daily as patient was not taking this medication at home.  Hypertension-continue losartan  Epigastric pain-resolved, continue Protonix daily.  Improved after starting Pepcid and Protonix.  Lipase was normal.  Likely from GERD.  Procedures:  None  Consultations:  None  Discharge Exam: Vitals:   02/05/18 0200 02/05/18 0506  BP: 100/74 92/66  Pulse: 67 74  Resp: 16 20  Temp: 98.1 F (36.7 C) 97.8 F (36.6 C)  SpO2: 100% 99%    General: Appears in no acute distress Cardiovascular: S1-S2, regular Respiratory: *Clear bilaterally  Discharge Instructions    Allergies as of 02/05/2018   No Known Allergies     Medication List    TAKE these medications   acetaminophen 325 MG tablet Commonly known as:  TYLENOL Take 2 tablets (650 mg total) by mouth every 4 (four) hours as needed for headache or mild pain.  amiodarone 200 MG tablet Commonly known as:  PACERONE Take 1 tablet (200 mg total) by mouth daily.   furosemide 20 MG tablet Commonly known as:  LASIX Take 1 tablet (20 mg total) by mouth as needed (weight gain/swelling in legs). Tome 1 pastilla como sea necesario para el aumento de peso What  changed:    how much to take  when to take this  additional instructions   losartan 25 MG tablet Commonly known as:  COZAAR Take 1 tablet (25 mg total) by mouth at bedtime. What changed:    how much to take  when to take this  additional instructions   ondansetron 4 MG tablet Commonly known as:  ZOFRAN Take 1 tablet (4 mg total) by mouth every 8 (eight) hours as needed for nausea or vomiting.   oxyCODONE 5 MG immediate release tablet Commonly known as:  Oxy IR/ROXICODONE Take 1 tablet (5 mg total) by mouth every 6 (six) hours as needed for moderate pain.   pantoprazole 40 MG tablet Commonly known as:  PROTONIX Take 1 tablet (40 mg total) by mouth daily. Tome 1 pastilla un vez al dia   spironolactone 25 MG tablet Commonly known as:  ALDACTONE Take 0.5 tablets (12.5 mg total) by mouth daily. Tome 1/2 pastilla un vez a la hora de dormir What changed:    how much to take  additional instructions      No Known Allergies    The results of significant diagnostics from this hospitalization (including imaging, microbiology, ancillary and laboratory) are listed below for reference.    Significant Diagnostic Studies: Mr Jeri Cos Wo Contrast  Result Date: 02/05/2018 CLINICAL DATA:  Headache normal neuro exam EXAM: MRI HEAD WITHOUT AND WITH CONTRAST MRV HEAD WITHOUT CONTRAST TECHNIQUE: Multiplanar, multiecho pulse sequences of the brain and surrounding structures were obtained without and with intravenous contrast. Angiographic images of the intracranial venous structures were obtained using MRV technique without intravenous contrast. CONTRAST:  67mL MULTIHANCE GADOBENATE DIMEGLUMINE 529 MG/ML IV SOLN COMPARISON:  CT head 02/04/2018 FINDINGS: Ventricle size normal. Cerebral volume normal. Negative for infarct, hemorrhage, or mass. Small hyperintensities in the left frontal white matter appear chronic. Normal enhancement. Normal arterial flow voids. Calvarium intact.  Paranasal  sinuses clear.  Normal orbit. Superior sagittal sinus is normal. Right transverse sinus patent. Hypoplastic left transverse sinus which is difficult to see on MRV images however is patent on postcontrast imaging. Arachnoid granulations in the left transverse sinus. No evidence of thrombus. Jugular vein patent bilaterally. IMPRESSION: No acute intracranial abnormality. Mild chronic white matter changes left frontal lobe likely due to chronic ischemia. Negative MRV head. 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 Electronically Signed   By: Franchot Gallo M.D.   On: 02/05/2018 11:41   Mr Mrv Head Wo Cm  Result Date: 02/05/2018 CLINICAL DATA:  Headache normal neuro exam EXAM: MRI HEAD WITHOUT AND WITH CONTRAST MRV HEAD WITHOUT CONTRAST TECHNIQUE: Multiplanar, multiecho pulse sequences of the brain and surrounding structures were obtained without and with intravenous contrast. Angiographic images of the intracranial venous structures were obtained using MRV technique without intravenous contrast. CONTRAST:  59mL MULTIHANCE GADOBENATE DIMEGLUMINE 529 MG/ML IV SOLN COMPARISON:  CT head 02/04/2018 FINDINGS: Ventricle size normal. Cerebral volume normal. Negative for infarct, hemorrhage, or mass. Small hyperintensities in the left frontal white matter appear chronic. Normal enhancement. Normal arterial flow voids. Calvarium intact.  Paranasal sinuses clear.  Normal orbit. Superior sagittal sinus is normal. Right transverse sinus patent. Hypoplastic left  transverse sinus which is difficult to see on MRV images however is patent on postcontrast imaging. Arachnoid granulations in the left transverse sinus. No evidence of thrombus. Jugular vein patent bilaterally. IMPRESSION: No acute intracranial abnormality. Mild chronic white matter changes left frontal lobe likely due to chronic ischemia. Negative MRV head. 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 Electronically Signed   By:  Franchot Gallo M.D.   On: 02/05/2018 11:41    Microbiology: No results found for this or any previous visit (from the past 240 hour(s)).   Labs: Basic Metabolic Panel: Recent Labs  Lab 02/05/18 0524  NA 140  K 3.7  CL 112*  CO2 19*  GLUCOSE 154*  BUN 15  CREATININE 0.62  CALCIUM 9.0   Liver Function Tests: No results for input(s): AST, ALT, ALKPHOS, BILITOT, PROT, ALBUMIN in the last 168 hours. Recent Labs  Lab 02/05/18 0524  LIPASE 25   No results for input(s): AMMONIA in the last 168 hours. CBC: Recent Labs  Lab 02/05/18 0524  WBC 10.5  HGB 13.6  HCT 41.4  MCV 97.0  PLT 263   Cardiac Enzymes: No results for input(s): CKTOTAL, CKMB, CKMBINDEX, TROPONINI in the last 168 hours. BNP: BNP (last 3 results) Recent Labs    04/15/17 1631 05/02/17 1442 02/05/18 0524  BNP 444.4* 431.7* 822.4*    ProBNP (last 3 results) No results for input(s): PROBNP in the last 8760 hours.  CBG: No results for input(s): GLUCAP in the last 168 hours.     Signed:  Oswald Hillock MD.  [facility] 02/05/2018, 1:09 PM

## 2018-02-05 NOTE — H&P (Signed)
History and Physical    Megan Keith ZDG:[id number] DOB: December 10, 1960 DOA: 02/05/2018  Referring MD/NP/PA:   PCP: Aldona Bar, MD   Patient coming from:  The patient is coming from home.  At baseline, pt is independent for most of ADL.  Chief Complaint: Headache, epigastric abdominal pain  HPI: Megan Keith is a 57 y.o. female with medical history significant of sCHF with EF 25%, GERD, frequent PVC, breast cancer (lumpectomy 11/2017, on radiation therapy, not on chemotherapy), left ventricular thrombus, DCM, who presents with headache, epigastric abdominal pain.  Patient is transfered from [facility] for neuro consultation.  Patient speaks Spanish, history is obtained with help from her granddaughter.  Patient states that her headache started on Friday morning, which has been progressively getting worse.  The headache is located in the left side of the head, throbbing pain, currently 10 out of 10 severity, nonradiating.  It is associated with some mild left facial numbness and blurry vision bilaterally.  Patient does not have unilateral weakness in extremities, no slurred speech or hearing loss.  Patient also reports some epigastric abdominal pain, which is dull, constant, nonradiating.  Associated with nausea and vomiting for few times.  No diarrhea.  Patient does not have fever or chills.  Patient also reports sore throat, no runny nose, cough, chest pain.  Denies symptoms of UTI.  Patient was initially evaluated in [facility], patient had a negative CT head for acute intracranial abnormalities.  CT angiogram of the neck and head showed no LVO. Telemetry neuro was consulted in [facility], recommended to get  MRI, MRA, MRV and then LP to r/o subarachnoid hemorrhage. Per her granddaughter, patient is only taking 3 medications, including losartan, amiodarone and spironolactone currently.  ED Course: pt was found to have WBC 8.2, proBNP 997, negative  troponin, lipase 83, urinalysis with 1+ leukocyte, and  5-10 WBC.  Renal function normal.  Temperature normal, no tachycardia.  Oxygen saturation 96% on room air.  Patient is admitted to telemetry bed as inpatient.  Dr. Leonel Ramsay for neurology was consulted.  Review of Systems:   General: no fevers, chills, no body weight gain, has poor appetite, has fatigue HEENT: no blurry vision, hearing changes or sore throat Respiratory: no dyspnea, coughing, wheezing CV: no chest pain, no palpitations GI: has nausea, vomiting, abdominal pain, no diarrhea, constipation GU: no dysuria, burning on urination, increased urinary frequency, hematuria  Ext: no leg edema Neuro: No hearing loss. Has HA, left facial numbness and bilateral blurred vision. Skin: no rash, no skin tear. MSK: No muscle spasm, no deformity, no limitation of range of movement in spin Heme: No easy bruising.  Travel history: No recent long distant travel.  Allergy: No Known Allergies  Past Medical History:  Diagnosis Date  . Anemia   . Arthritis    "knees, hands" (04/15/2017)  . Chronic knee pain   . Chronic systolic CHF (congestive heart failure) ([place])   . Pneumonia 06/2016    Past Surgical History:  Procedure Laterality Date  . BREAST CYST EXCISION Left   . CORONARY/GRAFT ANGIOGRAPHY N/A 04/15/2017   Procedure: CORONARY/GRAFT ANGIOGRAPHY;  Surgeon: Nelva Bush, MD;  Location: [place] CV LAB;  Service: Cardiovascular;  Laterality: N/A;  . RIGHT HEART CATH N/A 04/15/2017   Procedure: RIGHT HEART CATH;  Surgeon: Nelva Bush, MD;  Location: [place] CV LAB;  Service: Cardiovascular;  Laterality: N/A;  . TUBAL LIGATION      Social History:  reports that she has never  smoked. She has never used smokeless tobacco. She reports that she drinks alcohol. She reports that she does not use drugs.  Family History:  Family History  Problem Relation Age of Onset  . Hypertension Mother      Prior to Admission  medications   Medication Sig Start Date End Date Taking? Authorizing Provider  acetaminophen (TYLENOL) 325 MG tablet Take 2 tablets (650 mg total) by mouth every 4 (four) hours as needed for headache or mild pain. 04/21/17   Shirley Friar, PA-C  amiodarone (PACERONE) 200 MG tablet Take 1 tablet (200 mg total) by mouth daily. 11/04/17   Clegg, Amy D, NP  furosemide (LASIX) 20 MG tablet Take 1 tablet (20 mg total) by mouth as needed (weight gain/swelling in legs). Tome 1 pastilla como sea necesario para el aumento de peso 08/19/17   Bensimhon, Shaune Pascal, MD  gabapentin (NEURONTIN) 100 MG capsule Take 100 mg by mouth at bedtime.    [provider]  losartan (COZAAR) 25 MG tablet Take 1 tablet (25 mg total) by mouth at bedtime. 12/08/17   Bensimhon, Shaune Pascal, MD  pantoprazole (PROTONIX) 40 MG tablet Take 1 tablet (40 mg total) by mouth daily. Tome 1 pastilla un vez al dia 05/13/17 05/13/18  Arbutus Leas, NP  spironolactone (ALDACTONE) 25 MG tablet Take 0.5 tablets (12.5 mg total) by mouth daily. Tome 1/2 pastilla un vez a la hora de dormir 12/08/17   Bensimhon, Shaune Pascal, MD    Physical Exam: Vitals:   02/05/18 0200 02/05/18 0506  BP: 100/74 92/66  Pulse: 67 74  Resp: 16 20  Temp: 98.1 F (36.7 C) 97.8 F (36.6 C)  TempSrc: Oral   SpO2: 100% 99%   General: Not in acute distress HEENT:       Eyes: PERRL, EOMI, no scleral icterus.       ENT: No discharge from the ears and nose, no pharynx injection, no tonsillar enlargement.        Neck: No JVD, no bruit, no mass felt. Heme: No neck lymph node enlargement. Cardiac: S1/S2, RRR, No murmurs, No gallops or rubs. Respiratory: No rales, wheezing, rhonchi or rubs. GI: Soft, nondistended, has tenderness in the area, no rebound pain, no organomegaly, BS present. GU: No hematuria Ext: No pitting leg edema bilaterally. 2+DP/PT pulse bilaterally. Musculoskeletal: No joint deformities, No joint redness or warmth, no limitation of ROM in  spin. Skin: No rashes.  Neuro: Alert, oriented X3, cranial nerves II-XII grossly intact, moves all extremities normally. Muscle strength 5/5 in all extremities, sensation to light touch intact. Brachial reflex 2+ bilaterally. Negative Babinski's sign. Normal finger to nose test. Psych: Patient is not psychotic, no suicidal or hemocidal ideation.  Labs on Admission: I have personally reviewed following labs and imaging studies  CBC: Recent Labs  Lab 02/05/18 0524  WBC 10.5  HGB 13.6  HCT 41.4  MCV 97.0  PLT 409   Basic Metabolic Panel: No results for input(s): NA, K, CL, CO2, GLUCOSE, BUN, CREATININE, CALCIUM, MG, PHOS in the last 168 hours. GFR: CrCl cannot be calculated (Patient's most recent lab result is older than the maximum 21 days allowed.). Liver Function Tests: No results for input(s): AST, ALT, ALKPHOS, BILITOT, PROT, ALBUMIN in the last 168 hours. No results for input(s): LIPASE, AMYLASE in the last 168 hours. No results for input(s): AMMONIA in the last 168 hours. Coagulation Profile: No results for input(s): INR, PROTIME in the last 168 hours. Cardiac Enzymes: No results for  input(s): CKTOTAL, CKMB, CKMBINDEX, TROPONINI in the last 168 hours. BNP (last 3 results) No results for input(s): PROBNP in the last 8760 hours. HbA1C: No results for input(s): HGBA1C in the last 72 hours. CBG: No results for input(s): GLUCAP in the last 168 hours. Lipid Profile: No results for input(s): CHOL, HDL, LDLCALC, TRIG, CHOLHDL, LDLDIRECT in the last 72 hours. Thyroid Function Tests: No results for input(s): TSH, T4TOTAL, FREET4, T3FREE, THYROIDAB in the last 72 hours. Anemia Panel: No results for input(s): VITAMINB12, FOLATE, FERRITIN, TIBC, IRON, RETICCTPCT in the last 72 hours. Urine analysis: No results found for: COLORURINE, APPEARANCEUR, LABSPEC, PHURINE, GLUCOSEU, HGBUR, BILIRUBINUR, KETONESUR, PROTEINUR, UROBILINOGEN, NITRITE, LEUKOCYTESUR Sepsis  Labs: @LABRCNTIP (procalcitonin:4,lacticidven:4) )No results found for this or any previous visit (from the past 240 hour(s)).   Radiological Exams on Admission: No results found.   EKG: Independently reviewed.  Sinus rhythm, QTC 402, low voltage, nonspecific T wave change.  Assessment/Plan Principal Problem:   Headache Active Problems:   Chronic systolic CHF (congestive heart failure) (HCC)   Frequent PVCs   GERD (gastroesophageal reflux disease)   Breast cancer (HCC)   Nausea & vomiting   Epigastric abdominal pain   Sore throat   Headache: Etiology is not clear.  CT head is negative.  CT angiogram is negative for LVO.  Differential diagnosis include intracranial venous sinus thrombus formation, complicated migraine and subarachnoid hemorrhage.  Dr. Leonel Ramsay of neurology was consulted, he ordered an MRI with and without contrast and MRV.    -will admit to tele bed as inpt -given Depacon, Compazine and magnesium sulfate per Dr. Leonel Ramsay -F/u MRI and MRV  -f/u neurology's further recommendations -Frequent neuro check -UDS  Chronic systolic CHF (congestive heart failure) ([place]): 2D echo on 12/15/2017 showed EF 25-30%.  Patient does not have leg edema or JVD.  CHF seems to be compensated.  Since patient has some nausea vomiting and abdominal pain.  We will hold spironolactone. -Hold the spironolactone -check BMP  Hx of Frequent PVCs: HR 89. -Continue amiodarone  GERD: -Protonix -Pepcid IV  HTN: -Continue losartan -IV hydralazine.   Breast cancer [facility]): s/p of lumpectomy.  Currently on radiation therapy, not on chemotherapy per patient. -f/u  with oncologist  Nausea & vomiting and Epigastric abdominal pain: Etiology is not clear.  Patient has mild epigastric abdominal tenderness on examination, but no acute abdomen.  may be due to GERD. -Protonix orally -Add IV Pepcid 20 mg twice daily -Check lipase  Sore throat: -check Rapid strep  Abnormal UA: urinalysis  with 1+ leukocyte, and  5-10 WBC. Pt denies symptoms of UTI. -f/u urine culture.   DVT ppx: SCD Code Status: Full code Family Communication:  Yes, patient's granddaughter at bed side Disposition Plan:  Anticipate discharge back to previous home environment Consults called: Dr. Leonel Ramsay of neurology Admission status:  Inpatient/tele     Date of Service 02/05/2018    [facility] Pager [phone]  If 7PM-7AM, please contact night-coverage [email or web address] Password [facility] 02/05/2018, 6:04 AM

## 2018-02-05 NOTE — Progress Notes (Signed)
MRI and MRV head done. Official read below.  "IMPRESSION: No acute intracranial abnormality. Mild chronic white matter changes left frontal lobe likely due to chronic ischemia. Negative MRV head. 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 Please call with questions. Neurology will be available as needed.  -- Amie Portland, MD [facility] Pager: [phone] If 7pm to 7am, please call on call as listed on AMION.

## 2018-02-05 NOTE — Progress Notes (Signed)
Nsg Discharge Note  Admit Date:  02/05/2018 Discharge date: 02/05/2018   Megan Keith to be D/C'd Home per MD order.  AVS completed.  Copy for chart, and copy for patient signed, and dated. Patient/caregiver able to verbalize understanding.  Discharge Medication: Allergies as of 02/05/2018   No Known Allergies     Medication List    TAKE these medications   acetaminophen 325 MG tablet Commonly known as:  TYLENOL Take 2 tablets (650 mg total) by mouth every 4 (four) hours as needed for headache or mild pain.   amiodarone 200 MG tablet Commonly known as:  PACERONE Take 1 tablet (200 mg total) by mouth daily.   furosemide 20 MG tablet Commonly known as:  LASIX Take 1 tablet (20 mg total) by mouth daily. Tome 1 pastilla como sea necesario para el aumento de peso What changed:    when to take this  reasons to take this   losartan 25 MG tablet Commonly known as:  COZAAR Take 1 tablet (25 mg total) by mouth at bedtime. What changed:    how much to take  when to take this  additional instructions   ondansetron 4 MG tablet Commonly known as:  ZOFRAN Take 1 tablet (4 mg total) by mouth every 8 (eight) hours as needed for nausea or vomiting.   oxyCODONE 5 MG immediate release tablet Commonly known as:  Oxy IR/ROXICODONE Take 1 tablet (5 mg total) by mouth every 6 (six) hours as needed for moderate pain.   pantoprazole 40 MG tablet Commonly known as:  PROTONIX Take 1 tablet (40 mg total) by mouth daily. Tome 1 pastilla un vez al dia   spironolactone 25 MG tablet Commonly known as:  ALDACTONE Take 0.5 tablets (12.5 mg total) by mouth daily. Tome 1/2 pastilla un vez a la hora de dormir What changed:    how much to take  additional instructions       Discharge Assessment: Vitals:   02/05/18 0200 02/05/18 0506  BP: 100/74 92/66  Pulse: 67 74  Resp: 16 20  Temp: 98.1 F (36.7 C) 97.8 F (36.6 C)  SpO2: 100% 99%   Skin clean, dry and intact without  evidence of skin break down, no evidence of skin tears noted. IV catheter discontinued intact. Site without signs and symptoms of complications - no redness or edema noted at insertion site, patient denies c/o pain - only slight tenderness at site.  Dressing with slight pressure applied.  D/c Instructions-Education: Discharge instructions given to patient/family with verbalized understanding. D/c education completed with patient/family including follow up instructions, medication list, d/c activities limitations if indicated, with other d/c instructions as indicated by MD - patient able to verbalize understanding, all questions fully answered. Patient instructed to return to ED, call 911, or call MD for any changes in condition.  Patient escorted via [place], and D/C home via private auto.  Salley Slaughter, RN 02/05/2018 3:15 PM

## 2018-03-09 ENCOUNTER — Encounter (HOSPITAL_COMMUNITY): Payer: Self-pay

## 2018-03-12 NOTE — Progress Notes (Signed)
Advanced Heart Failure Clinic Note    Primary Care: No PCP Primary Cardiologist: Dr. Haroldine Laws, Dr. [name] in [place]   HPI: Megan Keith is a 57 y.o. 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    She presented to [facility] on 04/13/17 with neck pain, SOB and orthopnea. Last EF reported by Dr. [name] was 35%. Repeat echo showed reduced EF of 15%, she was transferred to [facility] for further evaluation. Underwent right and left heart catheterization on 04/15/17, no CAD. Fick output/index 5.28/3.34. Full results below.   Noted to have >20% PVC on telemetry. She was started on IV Amio and transitioned to po Amio. Also started on digoxin and spiro prior to discharge. BP was too soft to add any other medications. Discharge weight was 143 pounds.   Admitted with headache on 02/05/18. MRI of head negative. Neuro signed off and she was given tylenol and oxy PRN. BNP was elevated, but she was not taking lasix daily at home, so this was restarted.   She presents today for post hospital follow up. Spanish interpretor present. Overall doing okay. She is still SOB walking on flat ground around stores. She has occasional edema in LLE. Denies orthopnea. No palpitations or dizziness. She finished radiation 3 weeks ago. Appetite and energy level okay. She has occasional chills at night, but denies fever. She is taking lasix PRN. Last dose was about 2 weeks ago. Taking all medications, but is concerned because she has BUE tremors and a nonproductive cough after taking am and pm medications. It does not last long and resolves on its own. It started 3 weeks ago. Weights are stable at home ~141 lbs.   Review of systems complete and found to be negative unless listed in HPI.   Echo 03/2017 EF 10-15% Echo 07/2017 EF 25-30%  Echo 11/2017 EF 25-30%  Past Medical History:  Diagnosis Date  . Anemia   . Arthritis    "knees, hands" (04/15/2017)  . Chronic knee pain   . Chronic systolic CHF (congestive heart failure) ([place])   . Pneumonia 06/2016    Current Outpatient Medications  Medication Sig Dispense Refill  . amiodarone (PACERONE) 200 MG tablet Take 1 tablet (200 mg total) by mouth daily. 30 tablet 11  . Cyanocobalamin (VITAMIN B 12) 100 MCG LOZG Take 1,000 mcg by mouth daily.    . furosemide (LASIX) 20 MG tablet Take 1 tablet (20 mg total) by mouth daily. Tome 1 pastilla como sea necesario para el aumento de peso 30 tablet 5  . gabapentin (NEURONTIN) 100 MG capsule Take 100 mg by mouth at bedtime.    Marland Kitchen losartan (COZAAR) 25 MG tablet Take 1 tablet (25 mg total) by mouth at bedtime. (Patient taking differently: Take 12.5-25 mg by mouth See admin instructions. Takes 0.5 tablet (12.5mg ) in the morning and 1 tablet (25 mg) in the evening) 30 tablet 5  . acetaminophen (TYLENOL) 325 MG tablet Take 2 tablets (650 mg total) by mouth every 4 (four) hours as needed for headache or mild pain.    Marland Kitchen oxyCODONE (OXY IR/ROXICODONE) 5 MG immediate release tablet Take 1 tablet (5 mg total) by mouth every 6 (six) hours as needed for moderate pain. 15 tablet 0  . pantoprazole (PROTONIX) 40 MG tablet Take 1 tablet (40 mg total) by mouth daily. Tome 1 pastilla un vez al dia 30 tablet 0  . spironolactone (ALDACTONE) 25  MG tablet Take 0.5 tablets (12.5 mg total) by mouth daily. Tome 1/2 pastilla un vez a la hora de dormir (Patient not taking: Reported on 03/13/2018) 45 tablet 3   No current facility-administered medications for this encounter.    No Known Allergies  Social History   Socioeconomic History  . Marital status: Widowed    Spouse name: Not on file  . Number of children: Not on file  . Years of education: Not on file  . Highest education level: Not on file  Occupational History  . Not on file  Social Needs  . Financial resource strain: Not on file  . Food insecurity:    Worry: Not on file    Inability: Not  on file  . Transportation needs:    Medical: Not on file    Non-medical: Not on file  Tobacco Use  . Smoking status: Never Smoker  . Smokeless tobacco: Never Used  Substance and Sexual Activity  . Alcohol use: Yes    Comment: 04/15/2017 "might have a couple beers q couple months"  . Drug use: No  . Sexual activity: Never  Lifestyle  . Physical activity:    Days per week: Not on file    Minutes per session: Not on file  . Stress: Not on file  Relationships  . Social connections:    Talks on phone: Not on file    Gets together: Not on file    Attends religious service: Not on file    Active member of club or organization: Not on file    Attends meetings of clubs or organizations: Not on file    Relationship status: Not on file  . Intimate partner violence:    Fear of current or ex partner: Not on file    Emotionally abused: Not on file    Physically abused: Not on file    Forced sexual activity: Not on file  Other Topics Concern  . Not on file  Social History Narrative  . Not on file   Family History  Problem Relation Age of Onset  . Hypertension Mother    Vitals:   03/13/18 1518  BP: 106/72  Pulse: 80  SpO2: 100%  Weight: 146 lb 3.2 oz (66.3 kg)      Wt Readings from Last 3 Encounters:  03/13/18 146 lb 3.2 oz (66.3 kg)  12/08/17 147 lb 1.9 oz (66.7 kg)  11/04/17 143 lb (64.9 kg)   PHYSICAL EXAM: General: Appears anxious. No resp difficulty. HEENT: Normal Neck: Supple. JVP 6-7. Carotids 2+ bilat; no bruits. No thyromegaly or nodule noted. Cor: PMI nondisplaced. RRR, No M/G/R noted Lungs: CTAB, normal effort. Abdomen: Soft, non-tender, non-distended, no HSM. No bruits or masses. +BS  Extremities: No cyanosis, clubbing, or rash. RLE no edema, LLE trace edema. No asterixis.  Neuro: Alert & orientedx3, cranial nerves grossly intact. moves all 4 extremities w/o difficulty. Affect pleasant  ASSESSMENT & PLAN: 1. Chronic systolic CHF: NICM, normal cors in  03/2017. 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 03/2017  EF 15-20% Echo 07/28/17 EF 25-30%  - Echo 11/2017: EF 25-30% - NYHA II. Suspect dyspnea may be somewhat related to anxiety. Will need to watch for amio toxicity though.  - Volume status stable to mildly elevated on exam. She is taking lasix PRN. None in the last 2 weeks. Encouraged her to take a dose of lasix today.  - Increase spiro to 25 mg daily. Labs today and in 7 days.  - Continue losartan 25 mg qhs.  -  Continue amiodarone 200 daily for PVC suppression  - No b-blocker yet  - Unable to get cMRI due to lack of insurance.   2. Frequent PVC's - No PVCs on exam today. Denies palpitations.  - If EF improves with PVC suppression, will consider switch to flecainide to avoid long-term amio use.  - EF remains low on echo 11/2017. Continue amiodarone 200 mg daily for now.   3. Left Breast Cancer. - Finished radiation 3 weeks ago.    4. Tremors and nonproductive cough after AM and PM medications - Discussed with PharmD.  - Transient symptoms are not consistent with a drug allergy or amiodarone side effect. Check LFTs and TSH as above.   Increase spiro to 25 mg daily.  TFTs, CMET today.  BMET in 7 days.  Follow up in 6 weeks.  Georgiana Shore, NP 03/13/18   Greater than 50% of the 25 minute visit was spent in counseling/coordination of care regarding disease state education, salt/fluid restriction, sliding scale diuretics, and medication compliance.

## 2018-03-13 ENCOUNTER — Ambulatory Visit (HOSPITAL_COMMUNITY)
Admission: RE | Admit: 2018-03-13 | Discharge: 2018-03-13 | Disposition: A | Discharge status: Home / Self Care | Payer: Self-pay | Source: Ambulatory Visit | Attending: Cardiology | Admitting: Cardiology

## 2018-03-13 ENCOUNTER — Encounter (HOSPITAL_COMMUNITY): Payer: Self-pay

## 2018-03-13 VITALS — BP 106/72 | HR 80 | Wt 146.2 lb

## 2018-03-13 DIAGNOSIS — G8929 Other chronic pain: Secondary | ICD-10-CM | POA: Insufficient documentation

## 2018-03-13 DIAGNOSIS — Z79899 Other long term (current) drug therapy: Secondary | ICD-10-CM | POA: Insufficient documentation

## 2018-03-13 DIAGNOSIS — D649 Anemia, unspecified: Secondary | ICD-10-CM | POA: Insufficient documentation

## 2018-03-13 DIAGNOSIS — R251 Tremor, unspecified: Secondary | ICD-10-CM | POA: Insufficient documentation

## 2018-03-13 DIAGNOSIS — Z8249 Family history of ischemic heart disease and other diseases of the circulatory system: Secondary | ICD-10-CM | POA: Insufficient documentation

## 2018-03-13 DIAGNOSIS — Z79891 Long term (current) use of opiate analgesic: Secondary | ICD-10-CM | POA: Insufficient documentation

## 2018-03-13 DIAGNOSIS — M25569 Pain in unspecified knee: Secondary | ICD-10-CM | POA: Insufficient documentation

## 2018-03-13 DIAGNOSIS — I5022 Chronic systolic (congestive) heart failure: Secondary | ICD-10-CM | POA: Insufficient documentation

## 2018-03-13 DIAGNOSIS — I428 Other cardiomyopathies: Secondary | ICD-10-CM

## 2018-03-13 DIAGNOSIS — I493 Ventricular premature depolarization: Secondary | ICD-10-CM | POA: Insufficient documentation

## 2018-03-13 DIAGNOSIS — C50912 Malignant neoplasm of unspecified site of left female breast: Secondary | ICD-10-CM | POA: Insufficient documentation

## 2018-03-13 DIAGNOSIS — I429 Cardiomyopathy, unspecified: Secondary | ICD-10-CM | POA: Insufficient documentation

## 2018-03-13 LAB — COMPREHENSIVE METABOLIC PANEL
ALK PHOS: 99 U/L (ref 38–126)
ALT: 19 U/L (ref 0–44)
AST: 22 U/L (ref 15–41)
Albumin: 3.9 g/dL (ref 3.5–5.0)
Anion gap: 7 (ref 5–15)
BILIRUBIN TOTAL: 0.6 mg/dL (ref 0.3–1.2)
BUN: 8 mg/dL (ref 6–20)
CALCIUM: 9.1 mg/dL (ref 8.9–10.3)
CO2: 26 mmol/L (ref 22–32)
Chloride: 106 mmol/L (ref 98–111)
Creatinine, Ser: 0.74 mg/dL (ref 0.44–1.00)
GFR calc Af Amer: >60 mL/min (ref >60)
GFR calc non Af Amer: >60 mL/min (ref >60)
GLUCOSE: 112 mg/dL — AB (ref 70–99)
Potassium: 4 mmol/L (ref 3.5–5.1)
SODIUM: 139 mmol/L (ref 135–145)
Total Protein: 6.5 g/dL (ref 6.5–8.1)

## 2018-03-13 LAB — TSH: TSH: 0.176 u[IU]/mL — ABNORMAL LOW (ref 0.350–4.500)

## 2018-03-13 LAB — T4, FREE: Free T4: 1.59 ng/dL (ref 0.82–1.77)

## 2018-03-13 MED ORDER — SPIRONOLACTONE 25 MG PO TABS: 25.0000 mg | ORAL_TABLET | Freq: Every day | ORAL | 3 refills | Status: DC

## 2018-03-13 MED ORDER — SPIRONOLACTONE 25 MG PO TABS: 25.0000 mg | ORAL_TABLET | Freq: Once | ORAL | 3 refills | Status: DC

## 2018-03-13 NOTE — Patient Instructions (Signed)
INCREASE Spironolactone to 25 mg (1 whole tablet) once daily.  Routine lab work today. Will notify you of abnormal results, otherwise no news is good news!  Return in 1-2 weeks for repeat labs.  Follow up 6 weeks.  Take all medication as prescribed the day of your appointment. Bring all medications with you to your appointment.  Do the following things EVERYDAY: 1) Weigh yourself in the morning before breakfast. Write it down and keep it in a log. 2) Take your medicines as prescribed 3) Eat low salt foods-Limit salt (sodium) to 2000 mg per day.  4) Stay as active as you can everyday 5) Limit all fluids for the day to less than 2 liters

## 2018-03-14 LAB — T3, FREE: T3 FREE: 2.5 pg/mL (ref 2.0–4.4)

## 2018-03-27 ENCOUNTER — Ambulatory Visit (HOSPITAL_COMMUNITY)
Admission: RE | Admit: 2018-03-27 | Discharge: 2018-03-27 | Disposition: A | Discharge status: Home / Self Care | Payer: Self-pay | Source: Ambulatory Visit | Attending: Internal Medicine | Admitting: Internal Medicine

## 2018-03-27 ENCOUNTER — Other Ambulatory Visit (HOSPITAL_COMMUNITY): Payer: Self-pay | Admitting: *Deleted

## 2018-03-27 DIAGNOSIS — I5022 Chronic systolic (congestive) heart failure: Secondary | ICD-10-CM | POA: Insufficient documentation

## 2018-03-27 LAB — BASIC METABOLIC PANEL
Anion gap: 7 (ref 5–15)
BUN: 11 mg/dL (ref 6–20)
CALCIUM: 9.2 mg/dL (ref 8.9–10.3)
CO2: 27 mmol/L (ref 22–32)
Chloride: 107 mmol/L (ref 98–111)
Creatinine, Ser: 0.94 mg/dL (ref 0.44–1.00)
GFR calc Af Amer: >60 mL/min (ref >60)
GFR calc non Af Amer: >60 mL/min (ref >60)
Glucose, Bld: 98 mg/dL (ref 70–99)
POTASSIUM: 4.8 mmol/L (ref 3.5–5.1)
SODIUM: 141 mmol/L (ref 135–145)

## 2018-04-13 DIAGNOSIS — Z170 Estrogen receptor positive status [ER+]: Secondary | ICD-10-CM

## 2018-04-13 DIAGNOSIS — Z79811 Long term (current) use of aromatase inhibitors: Secondary | ICD-10-CM

## 2018-04-13 DIAGNOSIS — C50412 Malignant neoplasm of upper-outer quadrant of left female breast: Secondary | ICD-10-CM

## 2018-04-25 ENCOUNTER — Inpatient Hospital Stay (HOSPITAL_COMMUNITY)
Admission: RE | Admit: 2018-04-25 | Discharge: 2018-04-25 | Disposition: A | Discharge status: Home / Self Care | Payer: Self-pay | Source: Ambulatory Visit

## 2018-05-05 ENCOUNTER — Ambulatory Visit (HOSPITAL_COMMUNITY)
Admission: RE | Admit: 2018-05-05 | Discharge: 2018-05-05 | Disposition: A | Discharge status: Home / Self Care | Payer: Self-pay | Source: Ambulatory Visit | Attending: Internal Medicine | Admitting: Internal Medicine

## 2018-05-05 VITALS — BP 98/72 | HR 73 | Wt 142.8 lb

## 2018-05-05 DIAGNOSIS — I493 Ventricular premature depolarization: Secondary | ICD-10-CM | POA: Insufficient documentation

## 2018-05-05 DIAGNOSIS — Z7982 Long term (current) use of aspirin: Secondary | ICD-10-CM | POA: Insufficient documentation

## 2018-05-05 DIAGNOSIS — I428 Other cardiomyopathies: Secondary | ICD-10-CM | POA: Insufficient documentation

## 2018-05-05 DIAGNOSIS — I5022 Chronic systolic (congestive) heart failure: Secondary | ICD-10-CM | POA: Insufficient documentation

## 2018-05-05 DIAGNOSIS — M1990 Unspecified osteoarthritis, unspecified site: Secondary | ICD-10-CM | POA: Insufficient documentation

## 2018-05-05 DIAGNOSIS — Z79899 Other long term (current) drug therapy: Secondary | ICD-10-CM | POA: Insufficient documentation

## 2018-05-05 DIAGNOSIS — Z923 Personal history of irradiation: Secondary | ICD-10-CM | POA: Insufficient documentation

## 2018-05-05 DIAGNOSIS — Z79811 Long term (current) use of aromatase inhibitors: Secondary | ICD-10-CM | POA: Insufficient documentation

## 2018-05-05 DIAGNOSIS — C50912 Malignant neoplasm of unspecified site of left female breast: Secondary | ICD-10-CM | POA: Insufficient documentation

## 2018-05-05 DIAGNOSIS — Z8249 Family history of ischemic heart disease and other diseases of the circulatory system: Secondary | ICD-10-CM | POA: Insufficient documentation

## 2018-05-05 LAB — BASIC METABOLIC PANEL
Anion gap: 8 (ref 5–15)
BUN: 14 mg/dL (ref 6–20)
CHLORIDE: 106 mmol/L (ref 98–111)
CO2: 24 mmol/L (ref 22–32)
Calcium: 9.3 mg/dL (ref 8.9–10.3)
Creatinine, Ser: 0.71 mg/dL (ref 0.44–1.00)
GFR calc Af Amer: >60 mL/min (ref >60)
GFR calc non Af Amer: >60 mL/min (ref >60)
GLUCOSE: 99 mg/dL (ref 70–99)
POTASSIUM: 4.8 mmol/L (ref 3.5–5.1)
Sodium: 138 mmol/L (ref 135–145)

## 2018-05-05 NOTE — Progress Notes (Signed)
Advanced Heart Failure Clinic Note    Primary Care: No PCP Primary Cardiologist: Dr. Haroldine Laws, Dr. [name] in [place]   HPI: Megan Keith is a 57 y.o. 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    She presented to [facility] on 04/13/17 with neck pain, SOB and orthopnea. Last EF reported by Dr. [name] was 35%. Repeat echo showed reduced EF of 15%, she was transferred to [facility] for further evaluation. Underwent right and left heart catheterization on 04/15/17, no CAD. Fick output/index 5.28/3.34. Full results below.   Noted to have >20% PVC on telemetry. She was started on IV Amio and transitioned to po Amio. Also started on digoxin and spiro prior to discharge. BP was too soft to add any other medications. Discharge weight was 143 pounds.   Admitted with headache on 02/05/18. MRI of head negative. Neuro signed off and she was given tylenol and oxy PRN. BNP was elevated, but she was not taking lasix daily at home, so this was restarted.   Interpreter presentShe returns for HF follow up. Last visit spiro was increased. Overall feeling fine. Denies SOB/PND/Orthopnea. Appetite ok. No fever or chills. Weight at home has been stable. Taking all medications.    Echo 03/2017 EF 10-15% Echo 07/2017 EF 25-30%  Echo 11/2017 EF 25-30%   Review of systems complete and found to be negative unless listed in HPI.    Past Medical History:  Diagnosis Date  . Anemia   . Arthritis    "knees, hands" (04/15/2017)  . Chronic knee pain   . Chronic systolic CHF (congestive heart failure) ([place])   . Pneumonia 06/2016    Current Outpatient Medications  Medication Sig Dispense Refill  . amiodarone (PACERONE) 200 MG tablet Take 1 tablet (200 mg total) by mouth daily. 30 tablet 11  . anastrozole (ARIMIDEX) 1 MG tablet Take 1 mg by mouth daily.    Marland Kitchen aspirin 81 MG chewable tablet Chew 81 mg by mouth daily.     . Cyanocobalamin (VITAMIN B 12) 100 MCG LOZG Take 1,000 mcg by mouth daily.    Marland Kitchen losartan (COZAAR) 25 MG tablet Take 1 tablet (25 mg total) by mouth at bedtime. 30 tablet 5  . spironolactone (ALDACTONE) 25 MG tablet Take 1 tablet (25 mg total) by mouth daily. Tome 1 pastilla un vez a la hora de dormir 90 tablet 3  . acetaminophen (TYLENOL) 325 MG tablet Take 2 tablets (650 mg total) by mouth every 4 (four) hours as needed for headache or mild pain.    . furosemide (LASIX) 20 MG tablet Take 1 tablet (20 mg total) by mouth daily. Tome 1 pastilla como sea necesario para el aumento de [place] (Patient not taking: Reported on 05/05/2018) 30 tablet 5  . gabapentin (NEURONTIN) 100 MG capsule Take 100 mg by mouth at bedtime.    Marland Kitchen oxyCODONE (OXY IR/ROXICODONE) 5 MG immediate release tablet Take 1 tablet (5 mg total) by mouth every 6 (six) hours as needed for moderate pain. 15 tablet 0  . pantoprazole (PROTONIX) 40 MG tablet Take 1 tablet (40 mg total) by mouth daily. Tome 1 pastilla un vez al dia 30 tablet 0   No current facility-administered medications for this encounter.    No Known Allergies  Social History   Socioeconomic History  . Marital status: Widowed    Spouse name: Not on file  . Number of children: Not  on file  . Years of education: Not on file  . Highest education level: Not on file  Occupational History  . Not on file  Social Needs  . Financial resource strain: Not on file  . Food insecurity:    Worry: Not on file    Inability: Not on file  . Transportation needs:    Medical: Not on file    Non-medical: Not on file  Tobacco Use  . Smoking status: Never Smoker  . Smokeless tobacco: Never Used  Substance and Sexual Activity  . Alcohol use: Yes    Comment: 04/15/2017 "might have a couple beers q couple months"  . Drug use: No  . Sexual activity: Never  Lifestyle  . Physical activity:    Days per week: Not on file    Minutes per session: Not on file  . Stress: Not on file    Relationships  . Social connections:    Talks on phone: Not on file    Gets together: Not on file    Attends religious service: Not on file    Active member of club or organization: Not on file    Attends meetings of clubs or organizations: Not on file    Relationship status: Not on file  . Intimate partner violence:    Fear of current or ex partner: Not on file    Emotionally abused: Not on file    Physically abused: Not on file    Forced sexual activity: Not on file  Other Topics Concern  . Not on file  Social History Narrative  . Not on file   Family History  Problem Relation Age of Onset  . Hypertension Mother    Vitals:   05/05/18 0922  BP: 98/72  Pulse: 73  SpO2: 100%  Weight: 64.8 kg (142 lb 12.8 oz)      Wt Readings from Last 3 Encounters:  05/05/18 64.8 kg (142 lb 12.8 oz)  03/13/18 66.3 kg (146 lb 3.2 oz)  12/08/17 66.7 kg (147 lb 1.9 oz)   PHYSICAL EXAM: General:  Well appearing. No resp difficulty. Daughter present.  HEENT: normal Neck: supple. no JVD. Carotids 2+ bilat; no bruits. No lymphadenopathy or thryomegaly appreciated. Cor: PMI nondisplaced. Regular rate & rhythm. No rubs, gallops or murmurs. Lungs: clear Abdomen: soft, nontender, nondistended. No hepatosplenomegaly. No bruits or masses. Good bowel sounds. Extremities: no cyanosis, clubbing, rash, edema Neuro: alert & orientedx3, cranial nerves grossly intact. moves all 4 extremities w/o difficulty. Affect pleasant  EKG: NSR 68 bpm No PVCs. Personally reviewed.   ASSESSMENT & PLAN: 1. Chronic systolic CHF: NICM, normal cors in 03/2017. 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 03/2017  EF 15-20% Echo 07/28/17 EF 25-30%  - Echo 11/2017: EF 25-30%. Repeat ECHO next visit.  NYHA II. Volumes status stable. Continue lasix as needed. - Unable to increase HF medications due to soft BP.   - Continue spiro to 25 mg daily.  - Continue losartan 25 mg qhs.  - No b-blocker with soft BP.   - On amio to suppress PVC -  Unable to get cMRI due to lack of insurance.   2. Frequent PVC's - EKG today. SR with no PVCs  - If EF improves with PVC suppression, will consider switch to flecainide to avoid long-term amio use.  - EF remains low on echo 11/2017.  -Continue amiodarone 200 mg daily for now.   3. Left Breast Cancer. - Finished radiation 3 weeks ago.     Follow up  in 2 months with an ECHO and Dr Haroldine Laws. Next visit she will need BMET. Explained purpose of EKG and lab work.  Greater than 50% of the 25 minute visit was spent in counseling/coordination of care regarding disease state education, salt/fluid restriction, sliding scale diuretics, and medication compliance.     Darrick Grinder, NP 05/05/18   9:44 AM

## 2018-05-05 NOTE — Patient Instructions (Signed)
Routine lab work today. Will notify you of abnormal results, otherwise no news is good news!  No changes to medication at this time.  Follow up 2 months with Dr. Haroldine Laws and echocardiogram.  _________________________________________________________________________ Megan Keith Code: 1800  Take all medication as prescribed the day of your appointment. Bring all medications with you to your appointment.  Do the following things EVERYDAY: 1) Weigh yourself in the morning before breakfast. Write it down and keep it in a log. 2) Take your medicines as prescribed 3) Eat low salt foods-Limit salt (sodium) to 2000 mg per day.  4) Stay as active as you can everyday 5) Limit all fluids for the day to less than 2 liters

## 2018-05-25 NOTE — Addendum Note (Signed)
Encounter addended by: Georgiana Shore, NP on: 05/25/2018 10:06 AM  Actions taken: Delete clinical note

## 2018-05-31 NOTE — Addendum Note (Signed)
Encounter addended by: Conrad Centralia, NP on: 05/31/2018 10:36 AM  Actions taken: LOS modified

## 2018-06-30 ENCOUNTER — Ambulatory Visit (HOSPITAL_COMMUNITY)
Admission: RE | Admit: 2018-06-30 | Discharge: 2018-06-30 | Disposition: A | Discharge status: Home / Self Care | Payer: Self-pay | Source: Ambulatory Visit | Attending: Internal Medicine | Admitting: Internal Medicine

## 2018-06-30 ENCOUNTER — Ambulatory Visit (HOSPITAL_BASED_OUTPATIENT_CLINIC_OR_DEPARTMENT_OTHER)
Admission: RE | Admit: 2018-06-30 | Discharge: 2018-06-30 | Disposition: A | Discharge status: Home / Self Care | Payer: Self-pay | Source: Ambulatory Visit | Attending: Internal Medicine | Admitting: Internal Medicine

## 2018-06-30 VITALS — BP 94/68 | HR 79 | Wt 144.6 lb

## 2018-06-30 DIAGNOSIS — I493 Ventricular premature depolarization: Secondary | ICD-10-CM | POA: Insufficient documentation

## 2018-06-30 DIAGNOSIS — I5022 Chronic systolic (congestive) heart failure: Secondary | ICD-10-CM | POA: Insufficient documentation

## 2018-06-30 DIAGNOSIS — I428 Other cardiomyopathies: Secondary | ICD-10-CM | POA: Insufficient documentation

## 2018-06-30 DIAGNOSIS — Z923 Personal history of irradiation: Secondary | ICD-10-CM | POA: Insufficient documentation

## 2018-06-30 DIAGNOSIS — Z79811 Long term (current) use of aromatase inhibitors: Secondary | ICD-10-CM | POA: Insufficient documentation

## 2018-06-30 DIAGNOSIS — Z79899 Other long term (current) drug therapy: Secondary | ICD-10-CM | POA: Insufficient documentation

## 2018-06-30 DIAGNOSIS — Z8249 Family history of ischemic heart disease and other diseases of the circulatory system: Secondary | ICD-10-CM | POA: Insufficient documentation

## 2018-06-30 DIAGNOSIS — Z7982 Long term (current) use of aspirin: Secondary | ICD-10-CM | POA: Insufficient documentation

## 2018-06-30 DIAGNOSIS — M1990 Unspecified osteoarthritis, unspecified site: Secondary | ICD-10-CM | POA: Insufficient documentation

## 2018-06-30 DIAGNOSIS — C50912 Malignant neoplasm of unspecified site of left female breast: Secondary | ICD-10-CM | POA: Insufficient documentation

## 2018-06-30 LAB — BASIC METABOLIC PANEL WITH GFR
Anion gap: 5 (ref 5–15)
BUN: 16 mg/dL (ref 6–20)
CO2: 25 mmol/L (ref 22–32)
Calcium: 9 mg/dL (ref 8.9–10.3)
Chloride: 109 mmol/L (ref 98–111)
Creatinine, Ser: 0.91 mg/dL (ref 0.44–1.00)
GFR calc Af Amer: >60 mL/min
GFR calc non Af Amer: >60 mL/min
Glucose, Bld: 104 mg/dL — ABNORMAL HIGH (ref 70–99)
Potassium: 3.9 mmol/L (ref 3.5–5.1)
Sodium: 139 mmol/L (ref 135–145)

## 2018-06-30 MED ORDER — AMIODARONE HCL 200 MG PO TABS: 200.0000 mg | ORAL_TABLET | Freq: Every day | ORAL | 11 refills | Status: DC

## 2018-06-30 MED ORDER — LOSARTAN POTASSIUM 25 MG PO TABS: 12.5000 mg | ORAL_TABLET | Freq: Every day | ORAL | 6 refills | Status: DC

## 2018-06-30 NOTE — Progress Notes (Signed)
Advanced Heart Failure Clinic Note    Primary Care: No PCP Primary Cardiologist: Dr. Haroldine Laws, Dr. [name] in [place]   HPI: Megan Keith is a 57 y.o. 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    She presented to [facility] on 04/13/17 with neck pain, SOB and orthopnea. Last EF reported by Dr. [name] was 35%. Repeat echo showed reduced EF of 15%, she was transferred to [facility] for further evaluation. Underwent right and left heart catheterization on 04/15/17, no CAD. Fick output/index 5.28/3.34. Full results below.   Noted to have >20% PVC on telemetry. She was started on IV Amio and transitioned to po Amio. Also started on digoxin and spiro prior to discharge. BP was too soft to add any other medications. Discharge weight was 143 pounds.   Admitted with headache on 02/05/18. MRI of head negative. Neuro signed off and she was given tylenol and oxy PRN. BNP was elevated, but she was not taking lasix daily at home, so this was restarted.   Interpreter present.   She presents today for regular follow up with Echo. She has been feeling OK. When her blood pressure drops she feels tired and lightheaded, but otherwise dose OK. She is out of amiodarone and losartan for 2 weeks. Remains on spiro. Able to do ADLS without problem and go to store.  Denies SOB/PND/Orthopnea. Appetite OK. Weight stable at home. Has very occasional peripheral edema. She states her lasix was stopped, but she has some at home to take as needed.   Echo 03/2017 EF 10-15% Echo 07/2017 EF 25-30%  Echo 11/2017 EF 25-30%   Review of systems complete and found to be negative unless listed in HPI.    Past Medical History:  Diagnosis Date  . Anemia   . Arthritis    "knees, hands" (04/15/2017)  . Chronic knee pain   . Chronic systolic CHF (congestive heart failure) ([place])   . Pneumonia 06/2016    Current Outpatient  Medications  Medication Sig Dispense Refill  . acetaminophen (TYLENOL) 325 MG tablet Take 2 tablets (650 mg total) by mouth every 4 (four) hours as needed for headache or mild pain.    Marland Kitchen amiodarone (PACERONE) 200 MG tablet Take 1 tablet (200 mg total) by mouth daily. 30 tablet 11  . anastrozole (ARIMIDEX) 1 MG tablet Take 1 mg by mouth daily.    Marland Kitchen aspirin 81 MG chewable tablet Chew 81 mg by mouth daily.    . Cyanocobalamin (VITAMIN B 12) 100 MCG LOZG Take 1,000 mcg by mouth daily.    Marland Kitchen gabapentin (NEURONTIN) 100 MG capsule Take 100 mg by mouth at bedtime.    Marland Kitchen losartan (COZAAR) 25 MG tablet Take 1 tablet (25 mg total) by mouth at bedtime. 30 tablet 5  . oxyCODONE (OXY IR/ROXICODONE) 5 MG immediate release tablet Take 1 tablet (5 mg total) by mouth every 6 (six) hours as needed for moderate pain. 15 tablet 0  . pantoprazole (PROTONIX) 40 MG tablet Take 1 tablet (40 mg total) by mouth daily. Tome 1 pastilla un vez al dia 30 tablet 0  . spironolactone (ALDACTONE) 25 MG tablet Take 1 tablet (25 mg total) by mouth daily. Tome 1 pastilla un vez a la hora de dormir 90 tablet 3   No current facility-administered medications for this encounter.    No Known Allergies  Social History   Socioeconomic History  . Marital  status: Widowed    Spouse name: Not on file  . Number of children: Not on file  . Years of education: Not on file  . Highest education level: Not on file  Occupational History  . Not on file  Social Needs  . Financial resource strain: Not on file  . Food insecurity:    Worry: Not on file    Inability: Not on file  . Transportation needs:    Medical: Not on file    Non-medical: Not on file  Tobacco Use  . Smoking status: Never Smoker  . Smokeless tobacco: Never Used  Substance and Sexual Activity  . Alcohol use: Yes    Comment: 04/15/2017 "might have a couple beers q couple months"  . Drug use: No  . Sexual activity: Never  Lifestyle  . Physical activity:    Days per  week: Not on file    Minutes per session: Not on file  . Stress: Not on file  Relationships  . Social connections:    Talks on phone: Not on file    Gets together: Not on file    Attends religious service: Not on file    Active member of club or organization: Not on file    Attends meetings of clubs or organizations: Not on file    Relationship status: Not on file  . Intimate partner violence:    Fear of current or ex partner: Not on file    Emotionally abused: Not on file    Physically abused: Not on file    Forced sexual activity: Not on file  Other Topics Concern  . Not on file  Social History Narrative  . Not on file   Family History  Problem Relation Age of Onset  . Hypertension Mother    Vitals:   06/30/18 1154  BP: 94/68  Pulse: 79  SpO2: 100%  Weight: 65.6 kg (144 lb 9.6 oz)    Wt Readings from Last 3 Encounters:  06/30/18 65.6 kg (144 lb 9.6 oz)  05/05/18 64.8 kg (142 lb 12.8 oz)  03/13/18 66.3 kg (146 lb 3.2 oz)   PHYSICAL EXAM: General:  Well appearing. No resp difficulty HEENT: normal Neck: supple. no JVD. Carotids 2+ bilat; no bruits. No lymphadenopathy or thryomegaly appreciated. Cor: PMI nondisplaced. Regular rate & rhythm. No rubs, gallops or murmurs. Lungs: clear Abdomen: soft, nontender, nondistended. No hepatosplenomegaly. No bruits or masses. Good bowel sounds. Extremities: no cyanosis, clubbing, rash, edema Neuro: alert & orientedx3, cranial nerves grossly intact. moves all 4 extremities w/o difficulty. Affect pleasant   ASSESSMENT & PLAN: 1. Chronic systolic CHF: NICM, normal cors in 03/2017. 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 03/2017  EF 15-20% Echo 07/28/17 EF 25-30%  - Echo 11/2017: EF 25-30%.  - Echo today shows EF ~30%, personally reviewed by Dr. Haroldine Laws.  - NYHA II symptoms - Volume status stable on exam.   - Continue lasix as needed.  - Continue spiro 25 mg qhs for now. Last K 4.8 04/2018. Recheck today.  - Resume losartan at 12.5 mg qhs.    - No b-blocker with soft BP.   - On amio to suppress PVC - Unable to get cMRI due to lack of insurance.  - Discussed ICD at todays visit with persistently depressed EF. Suspect cost will be prohibitive. Will continue to work on [place], and if EF does not improve or patient wishes to move forward, will discuss with EP to see if she would qualify for an ICD under a  charity program.   2. Frequent PVC's - EKG today. SR with no PVCs  - If EF improves with PVC suppression, will consider switch to flecainide to avoid long-term amio use.  - EF remains low on echo 11/2017.  - She has been off of amiodarone. Will continue off for now.   3. Left Breast Cancer. - Finished radiation earlier 2019.    Shirley Friar, PA-C 06/30/18   12:00 PM  Patient seen and examined with the above-signed Advanced Practice Provider and/or Housestaff. I personally reviewed laboratory data, imaging studies and relevant notes. I independently examined the patient and formulated the important aspects of the plan. I have edited the note to reflect any of my changes or salient points. I have personally discussed the plan with the patient and/or family.  Overall stable NYHA II. Volume status ok. Having a hard time tolerating HF meds due to low BP. We will make adjustments as above. Echo today reviewed personally and EF stable at 30%. PVCs suppressed on amio. Long talk about possibility of ICD but she does not have insurance and is unsure if she wants to proceed. Will continue to titrate GDMT as tolerated and see if EF will improved to > 35%.  Glori Bickers, MD  1:47 PM

## 2018-06-30 NOTE — Patient Instructions (Addendum)
Labs done today  START Losartan 12.5mg  (0.5 tab) daily at bedtime  RESUME Amiodarone 200mg  (1 tab) daily   Follow up in 6-8 weeks with the Advance Practice Providers.

## 2018-06-30 NOTE — Progress Notes (Signed)
2D Echocardiogram has been performed.  Megan Keith 06/30/2018, 11:32 AM

## 2018-08-10 NOTE — Progress Notes (Signed)
Advanced Heart Failure Clinic Note    Primary Care: No PCP Primary Cardiologist: Dr. Haroldine Laws, Dr. [name] in [place]   HPI: Megan Keith is a 57 y.o. 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    She presented to [facility] on 04/13/17 with neck pain, SOB and orthopnea. Last EF reported by Dr. [name] was 35%. Repeat echo showed reduced EF of 15%, she was transferred to [facility] for further evaluation. Underwent right and left heart catheterization on 04/15/17, no CAD. Fick output/index 5.28/3.34. Full results below.   Noted to have >20% PVC on telemetry. She was started on IV Amio and transitioned to po Amio. Also started on digoxin and spiro prior to discharge. BP was too soft to add any other medications. Discharge weight was 143 pounds.   Admitted with headache on 02/05/18. MRI of head negative. Neuro signed off and she was given tylenol and oxy PRN. BNP was elevated, but she was not taking lasix daily at home, so this was restarted.   Interpreter present.   She returns today for HF follow up. Last visit echo showed EF still low at 30%. Losartan was resumed. She has been off her amiodarone and EKG showed NSR with no PVCs, so she was kept off. Planned to continue GDMT and hopefully EF will improve >35%. Overall doing better. She has been taking lasix 20 mg daily since last visit. Denies SOB unless she walks very fast. No problems as long as she goes slow. Denies edema, orthopnea, or PND. She has left sided chest discomfort since her breast surgery, no CP with exertion. She feels dizzy about twice/week. Symptoms associated with yawning and severe HA. She has been diagnosed with migraines in the past, but does not take anything. She lies down and feels better. Dizziness does not occur with standing or with activity. No palpitations. She is taking all medications. Appetite and energy level are  great. Weights stable 144-145 lbs at home.   Echo 03/2017 EF 10-15% Echo 07/2017 EF 25-30%  Echo 11/2017 EF 25-30% Echo 06/2018: EF ~30% (per Dr Haroldine Laws)  Review of systems complete and found to be negative unless listed in HPI.   Past Medical History:  Diagnosis Date  . Anemia   . Arthritis    "knees, hands" (04/15/2017)  . Chronic knee pain   . Chronic systolic CHF (congestive heart failure) ([place])   . Pneumonia 06/2016    Current Outpatient Medications  Medication Sig Dispense Refill  . acetaminophen (TYLENOL) 325 MG tablet Take 2 tablets (650 mg total) by mouth every 4 (four) hours as needed for headache or mild pain.    Marland Kitchen amiodarone (PACERONE) 200 MG tablet Take 1 tablet (200 mg total) by mouth daily. 30 tablet 11  . anastrozole (ARIMIDEX) 1 MG tablet Take 1 mg by mouth daily.    Marland Kitchen aspirin 81 MG chewable tablet Chew 81 mg by mouth daily.    . Cyanocobalamin (VITAMIN B 12) 100 MCG LOZG Take 1,000 mcg by mouth daily.    Marland Kitchen gabapentin (NEURONTIN) 100 MG capsule Take 100 mg by mouth at bedtime.    Marland Kitchen losartan (COZAAR) 25 MG tablet Take 0.5 tablets (12.5 mg total) by mouth at bedtime. 15 tablet 6  . oxyCODONE (OXY IR/ROXICODONE) 5 MG immediate release tablet Take 1 tablet (5 mg total) by mouth every 6 (six) hours as needed for moderate pain. 15 tablet 0  .  pantoprazole (PROTONIX) 40 MG tablet Take 1 tablet (40 mg total) by mouth daily. Tome 1 pastilla un vez al dia 30 tablet 0  . spironolactone (ALDACTONE) 25 MG tablet Take 1 tablet (25 mg total) by mouth daily. Tome 1 pastilla un vez a la hora de dormir 90 tablet 3   No current facility-administered medications for this encounter.    No Known Allergies  Social History   Socioeconomic History  . Marital status: Widowed    Spouse name: Not on file  . Number of children: Not on file  . Years of education: Not on file  . Highest education level: Not on file  Occupational History  . Not on file  Social Needs  . Financial  resource strain: Not on file  . Food insecurity:    Worry: Not on file    Inability: Not on file  . Transportation needs:    Medical: Not on file    Non-medical: Not on file  Tobacco Use  . Smoking status: Never Smoker  . Smokeless tobacco: Never Used  Substance and Sexual Activity  . Alcohol use: Yes    Comment: 04/15/2017 "might have a couple beers q couple months"  . Drug use: No  . Sexual activity: Never  Lifestyle  . Physical activity:    Days per week: Not on file    Minutes per session: Not on file  . Stress: Not on file  Relationships  . Social connections:    Talks on phone: Not on file    Gets together: Not on file    Attends religious service: Not on file    Active member of club or organization: Not on file    Attends meetings of clubs or organizations: Not on file    Relationship status: Not on file  . Intimate partner violence:    Fear of current or ex partner: Not on file    Emotionally abused: Not on file    Physically abused: Not on file    Forced sexual activity: Not on file  Other Topics Concern  . Not on file  Social History Narrative  . Not on file   Family History  Problem Relation Age of Onset  . Hypertension Mother    Vitals:   08/11/18 0947  BP: 122/74  Pulse: 90  SpO2: 98%  Weight: 67.1 kg (148 lb)    Wt Readings from Last 3 Encounters:  08/11/18 67.1 kg (148 lb)  06/30/18 65.6 kg (144 lb 9.6 oz)  05/05/18 64.8 kg (142 lb 12.8 oz)   PHYSICAL EXAM: General: Well appearing. No resp difficulty. HEENT: Normal Neck: Supple. JVP 5-6. Carotids 2+ bilat; no bruits. No thyromegaly or nodule noted. Cor: PMI nondisplaced. RRR, No M/G/R noted Lungs: CTAB, normal effort. Abdomen: Soft, non-tender, non-distended, no HSM. No bruits or masses. +BS  Extremities: No cyanosis, clubbing, or rash. R and LLE no edema.  Neuro: Alert & orientedx3, cranial nerves grossly intact. moves all 4 extremities w/o difficulty. Affect pleasant   ASSESSMENT &  PLAN: 1. Chronic systolic CHF: NICM, normal cors in 03/2017. 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 03/2017  EF 15-20% Echo 07/28/17 EF 25-30%  - Echo 11/2017: EF 25-30%.  - Echo 06/2018 EF ~30% - NYHA II symptoms - Volume status stable on exam.   - Continue lasix 20 mg daily - Continue spiro 25 mg qhs  - Continue losartan at 12.5 mg qhs.  - Start coreg 3.125 mg BID - On amio to suppress PVCs - Unable  to get cMRI due to lack of insurance.  - Last visit, Dr Haroldine Laws discussed ICD at length. She does not currently have insurance. Suspect cost will be prohibitive. Will continue to work on [place], and if EF does not improve or patient wishes to move forward, will discuss with EP to see if she would qualify for an ICD under a charity program.   2. Frequent PVC's - EF remains low 06/2018 30% - Continue amio 200 mg daily for now. Repeat echo next visit. If EF improves with PVC suppression, will consider switch to flecainide to avoid long-term amio use. LFTs normal 02/2018. TSH low but T4 normal 02/2018. Recheck next visit.   3. Left Breast Cancer. - Finished radiation 3 months ago.    4. Dizziness - Occurs at rest and always has a severe HA and yawning with it. Not associated with standing or activity. Sounds like a migraine. She has a hx of migraines. I told her it is okay for her to take tylenol, but to avoid NSAIDs. She has a PCP and will follow up with her if symptoms continue. BP much better 120s.  Start coreg 3.125 mg BID BMET today Follow up in 2 months with Dr Haroldine Laws with repeat echo  Georgiana Shore, NP 08/11/18 9:58 AM  Greater than 50% of the 25 minute visit was spent in counseling/coordination of care regarding disease state education, salt/fluid restriction, sliding scale diuretics, and medication compliance.

## 2018-08-11 ENCOUNTER — Encounter (HOSPITAL_COMMUNITY): Payer: Self-pay

## 2018-08-11 ENCOUNTER — Ambulatory Visit (HOSPITAL_COMMUNITY)
Admission: RE | Admit: 2018-08-11 | Discharge: 2018-08-11 | Disposition: A | Discharge status: Home / Self Care | Payer: Self-pay | Source: Ambulatory Visit | Attending: Internal Medicine | Admitting: Internal Medicine

## 2018-08-11 VITALS — BP 122/74 | HR 90 | Wt 148.0 lb

## 2018-08-11 DIAGNOSIS — M1710 Unilateral primary osteoarthritis, unspecified knee: Secondary | ICD-10-CM | POA: Insufficient documentation

## 2018-08-11 DIAGNOSIS — I428 Other cardiomyopathies: Secondary | ICD-10-CM | POA: Insufficient documentation

## 2018-08-11 DIAGNOSIS — R42 Dizziness and giddiness: Secondary | ICD-10-CM | POA: Insufficient documentation

## 2018-08-11 DIAGNOSIS — G43909 Migraine, unspecified, not intractable, without status migrainosus: Secondary | ICD-10-CM | POA: Insufficient documentation

## 2018-08-11 DIAGNOSIS — Z79899 Other long term (current) drug therapy: Secondary | ICD-10-CM | POA: Insufficient documentation

## 2018-08-11 DIAGNOSIS — Z7982 Long term (current) use of aspirin: Secondary | ICD-10-CM | POA: Insufficient documentation

## 2018-08-11 DIAGNOSIS — C50912 Malignant neoplasm of unspecified site of left female breast: Secondary | ICD-10-CM | POA: Insufficient documentation

## 2018-08-11 DIAGNOSIS — I5022 Chronic systolic (congestive) heart failure: Secondary | ICD-10-CM | POA: Insufficient documentation

## 2018-08-11 DIAGNOSIS — M19049 Primary osteoarthritis, unspecified hand: Secondary | ICD-10-CM | POA: Insufficient documentation

## 2018-08-11 DIAGNOSIS — Z8249 Family history of ischemic heart disease and other diseases of the circulatory system: Secondary | ICD-10-CM | POA: Insufficient documentation

## 2018-08-11 DIAGNOSIS — I493 Ventricular premature depolarization: Secondary | ICD-10-CM | POA: Insufficient documentation

## 2018-08-11 LAB — BASIC METABOLIC PANEL
Anion gap: 8 (ref 5–15)
BUN: 10 mg/dL (ref 6–20)
CALCIUM: 9 mg/dL (ref 8.9–10.3)
CHLORIDE: 107 mmol/L (ref 98–111)
CO2: 23 mmol/L (ref 22–32)
CREATININE: 0.65 mg/dL (ref 0.44–1.00)
GFR calc non Af Amer: >60 mL/min (ref >60)
Glucose, Bld: 125 mg/dL — ABNORMAL HIGH (ref 70–99)
Potassium: 4.1 mmol/L (ref 3.5–5.1)
SODIUM: 138 mmol/L (ref 135–145)

## 2018-08-11 MED ORDER — CARVEDILOL 3.125 MG PO TABS: 3.1250 mg | ORAL_TABLET | Freq: Two times a day (BID) | ORAL | 2 refills | Status: DC

## 2018-08-11 NOTE — Patient Instructions (Addendum)
START Coreg 3.125mg  (1 tab) twice a day  Labs today We will only contact you if something comes back abnormal or we need to make some changes. Otherwise no news is good news!  Your physician has requested that you have an echocardiogram. Echocardiography is a painless test that uses sound waves to create images of your heart. It provides your doctor with information about the size and shape of your heart and how well your heart's chambers and valves are working. This procedure takes approximately one hour. There are no restrictions for this procedure.  Your physician recommends that you schedule a follow-up appointment in: 2 months with an Echo, Dr. Haroldine Laws

## 2018-08-14 DIAGNOSIS — Z79811 Long term (current) use of aromatase inhibitors: Secondary | ICD-10-CM

## 2018-08-14 DIAGNOSIS — C50412 Malignant neoplasm of upper-outer quadrant of left female breast: Secondary | ICD-10-CM

## 2018-08-14 DIAGNOSIS — Z170 Estrogen receptor positive status [ER+]: Secondary | ICD-10-CM

## 2018-10-17 ENCOUNTER — Encounter (HOSPITAL_COMMUNITY): Payer: Self-pay | Admitting: Internal Medicine

## 2018-10-17 ENCOUNTER — Ambulatory Visit (HOSPITAL_COMMUNITY): Admission: RE | Admit: 2018-10-17 | Payer: Self-pay | Source: Ambulatory Visit

## 2018-12-14 DIAGNOSIS — C50412 Malignant neoplasm of upper-outer quadrant of left female breast: Secondary | ICD-10-CM

## 2018-12-14 DIAGNOSIS — Z170 Estrogen receptor positive status [ER+]: Secondary | ICD-10-CM

## 2019-01-01 ENCOUNTER — Encounter (HOSPITAL_COMMUNITY): Payer: Self-pay | Admitting: Internal Medicine

## 2019-01-01 ENCOUNTER — Ambulatory Visit (HOSPITAL_COMMUNITY): Payer: Self-pay

## 2019-02-19 DIAGNOSIS — Z170 Estrogen receptor positive status [ER+]: Secondary | ICD-10-CM

## 2019-02-19 DIAGNOSIS — C50412 Malignant neoplasm of upper-outer quadrant of left female breast: Secondary | ICD-10-CM

## 2019-03-04 DIAGNOSIS — R079 Chest pain, unspecified: Secondary | ICD-10-CM

## 2019-03-04 DIAGNOSIS — I509 Heart failure, unspecified: Secondary | ICD-10-CM

## 2019-03-04 DIAGNOSIS — I361 Nonrheumatic tricuspid (valve) insufficiency: Secondary | ICD-10-CM

## 2019-03-04 DIAGNOSIS — I5023 Acute on chronic systolic (congestive) heart failure: Secondary | ICD-10-CM

## 2019-03-04 DIAGNOSIS — I4891 Unspecified atrial fibrillation: Secondary | ICD-10-CM

## 2019-03-04 DIAGNOSIS — I340 Nonrheumatic mitral (valve) insufficiency: Secondary | ICD-10-CM

## 2019-03-04 DIAGNOSIS — R748 Abnormal levels of other serum enzymes: Secondary | ICD-10-CM

## 2019-03-05 ENCOUNTER — Encounter: Payer: Self-pay | Admitting: Physician Assistant

## 2019-03-05 ENCOUNTER — Inpatient Hospital Stay: Payer: Self-pay

## 2019-03-05 ENCOUNTER — Other Ambulatory Visit: Payer: Self-pay

## 2019-03-05 ENCOUNTER — Inpatient Hospital Stay (HOSPITAL_COMMUNITY)
Admission: AD | Admit: 2019-03-05 | Discharge: 2019-03-10 | DRG: 308 | Disposition: A | Discharge status: Home / Self Care | Payer: Medicaid Other | Source: Other Acute Inpatient Hospital | Attending: Internal Medicine | Admitting: Internal Medicine

## 2019-03-05 DIAGNOSIS — Z9114 Patient's other noncompliance with medication regimen: Secondary | ICD-10-CM

## 2019-03-05 DIAGNOSIS — I428 Other cardiomyopathies: Secondary | ICD-10-CM | POA: Diagnosis present

## 2019-03-05 DIAGNOSIS — I272 Pulmonary hypertension, unspecified: Secondary | ICD-10-CM | POA: Diagnosis present

## 2019-03-05 DIAGNOSIS — Z452 Encounter for adjustment and management of vascular access device: Secondary | ICD-10-CM

## 2019-03-05 DIAGNOSIS — I480 Paroxysmal atrial fibrillation: Secondary | ICD-10-CM

## 2019-03-05 DIAGNOSIS — I4891 Unspecified atrial fibrillation: Secondary | ICD-10-CM | POA: Diagnosis present

## 2019-03-05 DIAGNOSIS — I4892 Unspecified atrial flutter: Secondary | ICD-10-CM | POA: Diagnosis present

## 2019-03-05 DIAGNOSIS — I420 Dilated cardiomyopathy: Secondary | ICD-10-CM

## 2019-03-05 DIAGNOSIS — Z853 Personal history of malignant neoplasm of breast: Secondary | ICD-10-CM | POA: Diagnosis not present

## 2019-03-05 DIAGNOSIS — T462X5A Adverse effect of other antidysrhythmic drugs, initial encounter: Secondary | ICD-10-CM | POA: Diagnosis present

## 2019-03-05 DIAGNOSIS — E079 Disorder of thyroid, unspecified: Secondary | ICD-10-CM

## 2019-03-05 DIAGNOSIS — I110 Hypertensive heart disease with heart failure: Secondary | ICD-10-CM | POA: Diagnosis present

## 2019-03-05 DIAGNOSIS — I959 Hypotension, unspecified: Secondary | ICD-10-CM | POA: Diagnosis present

## 2019-03-05 DIAGNOSIS — K219 Gastro-esophageal reflux disease without esophagitis: Secondary | ICD-10-CM | POA: Diagnosis present

## 2019-03-05 DIAGNOSIS — I493 Ventricular premature depolarization: Secondary | ICD-10-CM | POA: Diagnosis present

## 2019-03-05 DIAGNOSIS — I429 Cardiomyopathy, unspecified: Secondary | ICD-10-CM

## 2019-03-05 DIAGNOSIS — I5023 Acute on chronic systolic (congestive) heart failure: Secondary | ICD-10-CM | POA: Diagnosis present

## 2019-03-05 DIAGNOSIS — I509 Heart failure, unspecified: Secondary | ICD-10-CM | POA: Diagnosis present

## 2019-03-05 DIAGNOSIS — D0512 Intraductal carcinoma in situ of left breast: Secondary | ICD-10-CM

## 2019-03-05 DIAGNOSIS — Z20828 Contact with and (suspected) exposure to other viral communicable diseases: Secondary | ICD-10-CM | POA: Diagnosis present

## 2019-03-05 DIAGNOSIS — E0590 Thyrotoxicosis, unspecified without thyrotoxic crisis or storm: Secondary | ICD-10-CM

## 2019-03-05 DIAGNOSIS — Z8249 Family history of ischemic heart disease and other diseases of the circulatory system: Secondary | ICD-10-CM | POA: Diagnosis not present

## 2019-03-05 DIAGNOSIS — E0580 Other thyrotoxicosis without thyrotoxic crisis or storm: Secondary | ICD-10-CM | POA: Diagnosis present

## 2019-03-05 DIAGNOSIS — E876 Hypokalemia: Secondary | ICD-10-CM | POA: Diagnosis present

## 2019-03-05 DIAGNOSIS — R945 Abnormal results of liver function studies: Secondary | ICD-10-CM | POA: Diagnosis present

## 2019-03-05 HISTORY — DX: Insufficient health insurance coverage: Z59.71

## 2019-03-05 HISTORY — DX: Other cardiomyopathies: I42.8

## 2019-03-05 HISTORY — DX: Malignant neoplasm of unspecified site of unspecified female breast: C50.919

## 2019-03-05 HISTORY — DX: Other problems related to housing and economic circumstances: Z59.89

## 2019-03-05 HISTORY — DX: Other specified abnormal findings of blood chemistry: R79.89

## 2019-03-05 HISTORY — DX: Patient's noncompliance with other medical treatment and regimen due to unspecified reason: Z91.199

## 2019-03-05 HISTORY — DX: Ventricular premature depolarization: I49.3

## 2019-03-05 HISTORY — DX: Patient's noncompliance with other medical treatment and regimen: Z91.19

## 2019-03-05 HISTORY — DX: Thyrotoxicosis, unspecified without thyrotoxic crisis or storm: E05.90

## 2019-03-05 LAB — COMPREHENSIVE METABOLIC PANEL
ALT: 60 U/L — ABNORMAL HIGH (ref 0–44)
AST: 31 U/L (ref 15–41)
Albumin: 3.8 g/dL (ref 3.5–5.0)
Alkaline Phosphatase: 106 U/L (ref 38–126)
Anion gap: 11 (ref 5–15)
BUN: 12 mg/dL (ref 6–20)
CO2: 25 mmol/L (ref 22–32)
Calcium: 9.4 mg/dL (ref 8.9–10.3)
Chloride: 103 mmol/L (ref 98–111)
Creatinine, Ser: 0.7 mg/dL (ref 0.44–1.00)
GFR calc Af Amer: >60 mL/min (ref >60)
GFR calc non Af Amer: >60 mL/min (ref >60)
Glucose, Bld: 121 mg/dL — ABNORMAL HIGH (ref 70–99)
Potassium: 3.2 mmol/L — ABNORMAL LOW (ref 3.5–5.1)
Sodium: 139 mmol/L (ref 135–145)
Total Bilirubin: 1 mg/dL (ref 0.3–1.2)
Total Protein: 6.7 g/dL (ref 6.5–8.1)

## 2019-03-05 LAB — CBC
HCT: 42.7 % (ref 36.0–46.0)
Hemoglobin: 14.5 g/dL (ref 12.0–15.0)
MCH: 31.5 pg (ref 26.0–34.0)
MCHC: 34 g/dL (ref 30.0–36.0)
MCV: 92.8 fL (ref 80.0–100.0)
Platelets: 316 10*3/uL (ref 150–400)
RBC: 4.6 MIL/uL (ref 3.87–5.11)
RDW: 12.6 % (ref 11.5–15.5)
WBC: 9.4 10*3/uL (ref 4.0–10.5)
nRBC: 0 % (ref 0.0–0.2)

## 2019-03-05 LAB — SARS CORONAVIRUS 2 BY RT PCR (HOSPITAL ORDER, PERFORMED IN ~~LOC~~ HOSPITAL LAB): SARS Coronavirus 2: NEGATIVE

## 2019-03-05 LAB — MAGNESIUM: Magnesium: 2.2 mg/dL (ref 1.7–2.4)

## 2019-03-05 LAB — TSH: TSH: 0.011 u[IU]/mL — ABNORMAL LOW (ref 0.350–4.500)

## 2019-03-05 LAB — T4, FREE: Free T4: 2.43 ng/dL — ABNORMAL HIGH (ref 0.61–1.12)

## 2019-03-05 MED ORDER — APIXABAN 5 MG PO TABS
5.0000 mg | ORAL_TABLET | Freq: Two times a day (BID) | ORAL | Status: DC
  Administered 2019-03-05 – 2019-03-10 (×10): 5 mg via ORAL
  Filled 2019-03-05 (×10): qty 1

## 2019-03-05 MED ORDER — POTASSIUM CHLORIDE CRYS ER 20 MEQ PO TBCR: 40.0000 meq | EXTENDED_RELEASE_TABLET | Freq: Once | ORAL | Status: AC

## 2019-03-05 MED ORDER — METHIMAZOLE 10 MG PO TABS
40.0000 mg | ORAL_TABLET | Freq: Every day | ORAL | Status: DC
  Administered 2019-03-05 – 2019-03-09 (×5): 40 mg via ORAL
  Filled 2019-03-05 (×5): qty 4

## 2019-03-05 MED ORDER — SODIUM CHLORIDE 0.9% FLUSH
3.0000 mL | Freq: Two times a day (BID) | INTRAVENOUS | Status: DC
  Administered 2019-03-05 – 2019-03-10 (×8): 3 mL via INTRAVENOUS

## 2019-03-05 MED ORDER — PREDNISONE 20 MG PO TABS
40.0000 mg | ORAL_TABLET | Freq: Every day | ORAL | Status: DC
  Administered 2019-03-05 – 2019-03-08 (×3): 40 mg via ORAL
  Filled 2019-03-05 (×4): qty 2

## 2019-03-05 MED ORDER — POTASSIUM CHLORIDE 20 MEQ/15ML (10%) PO SOLN
40.0000 meq | Freq: Once | ORAL | Status: AC
  Administered 2019-03-05: 22:00:00 40 meq via ORAL
  Filled 2019-03-05: qty 30

## 2019-03-05 MED ORDER — PREDNISONE 20 MG PO TABS: 40.0000 mg | ORAL_TABLET | Freq: Every day | ORAL | Status: DC

## 2019-03-05 MED ORDER — FUROSEMIDE 10 MG/ML IJ SOLN
80.0000 mg | Freq: Two times a day (BID) | INTRAMUSCULAR | Status: DC
  Administered 2019-03-06: 80 mg via INTRAVENOUS
  Filled 2019-03-05: qty 8

## 2019-03-05 NOTE — Plan of Care (Signed)
  Problem: Education: Goal: Knowledge of General Education information will improve Description: Including pain rating scale, medication(s)/side effects and non-pharmacologic comfort measures Outcome: Progressing   Problem: Clinical Measurements: Goal: Cardiovascular complication will be avoided Outcome: Progressing   Problem: Activity: Goal: Risk for activity intolerance will decrease Outcome: Progressing   Problem: Nutrition: Goal: Adequate nutrition will be maintained Outcome: Progressing   Problem: Education: Goal: Ability to demonstrate management of disease process will improve Outcome: Progressing

## 2019-03-05 NOTE — Consult Note (Addendum)
Cardiology Consultation:   Patient ID: Megan Keith; [id number]; 1961/02/05   Admit date: 03/05/2019 Date of Consult: 03/05/2019  Primary Care Provider: Aldona Bar, MD Primary Cardiologist: Glori Bickers, MD (CHF) Primary Electrophysiologist:  None  Chief Complaint: SOB  Patient Profile:   Megan Keith is a 58 y.o. 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  History of Present Illness:   She has history of low EF reportedly going back to about 2014 with EF 35% and cath without CAD. In 03/2017 she presented to [facility] with neck pain, SOB and orthopnea. Repeat echo showed reduced EF of 15%, she was transferred to [facility] for further evaluation She underwent right and left heart catheterization on 04/15/17, no CAD. Fick output/index 5.28/3.34. She was noted to have extensive PVC burden >20% on telemetry. She was started on IV Amio and transitioned to po Amio along with digoxin and spironolactone. She has also since been started on low dose losartan and carvedilol but BP otherwise too soft to add aggressive med therapy. 2D echo 06/2018 showed EF 25-30%, diffuse HK, severe LAE, normal RV, mild RAE, mild-moderately elevated PASP, normal CVP. Repeat echo was planned for 09/2018 with plan to consider transitioning amio to flecainide if LVEF improved but she failed to show for this. There has been intermittent noncompliance. ICD has not been pursued due to cost as she has been uninsured. Labs 7/22 showed suppressed TSH 0.176 but normal free T4, with recommendation for continued monitoring.   The chart from the ER at [facility] is a little bit different than what the patient tells via telephone using her nurse to help interpret. Per the ER record she presented to [facility] with 1 day history of fever, myalgias, body aches, SOB, nonproductive cough and headache as well as heart racing. Per report she had had multiple family members that had tested positive for Covid. She indicates to me that she did have SOB and headache but no fevers, chills or body aches. Her son tested positive for Covid-19 but they have been apart for 15 days. She lives with her granddaughter and neither have been sick. She ran out of her medicine about 2.5 weeks ago. This admission she was found to be in AF RVR. Labs showed WBC 9.6, Hgb 14.1, Plt 249, Na 135, K 3.8, CO2 20L, glucose 127, AST 97, ALT 74, alik pos 129, PBNP 4450, LDH elevated at 696, troponin negative, Ddimer 939, ferritin wnl, lactic acid wnl, procalcitonin <0.05, Covid negative, BUN 14, Cr 0.60. CXR 03/04/19 - CHF - cardiomegaly with vascular congestion and hazy airspace opacities c/w pulmonary edema. Ct angio 03/04/19 showed no PE, + CHF with small pleural effusions + ground glass opacities in the posterior lungs, mostly lower lobes.   She was started on IV Lasix but do not see anticoag started other than DVT dosing Lovenox. Discharge summary was not sent with patient. MAR indicates she had been on Tylenol, amiodarone 200mg  daily, Anastrozole, Carvedilol 3.125mg  BID (with "hold dose" note), Lovenox 40mg  SQ, Lasix 40mg  IV BID, multivitamin. It appears she had been on diltiazem but this was discontinued. Her HR remains in the 120s-140s at times, although she states she is feeling somewhat better. Given report of possible Covid exposure, this consult note was done remotely per d/w Dr. Haroldine Laws who  will complete remaining physical.  Past Medical History:  Diagnosis Date  . Abnormal TSH   . Anemia   . Arthritis    "knees, hands" (04/15/2017)  . Breast cancer ([place])   . Chronic knee pain   . Chronic systolic CHF (congestive heart failure) ([place])   . Does not have health insurance   . Frequent PVCs   . NICM (nonischemic  cardiomyopathy) ([place])    a. ? PVC cardiomyopathy. right and left heart catheterization on 04/15/17, no CAD. Fick output/index 5.28/3.34.  . Noncompliance    a. episodic noncompliance.  . Pneumonia 06/2016    Past Surgical History:  Procedure Laterality Date  . BREAST CYST EXCISION Left   . CORONARY/GRAFT ANGIOGRAPHY N/A 04/15/2017   Procedure: CORONARY/GRAFT ANGIOGRAPHY;  Surgeon: Nelva Bush, MD;  Location: [place] [facility];  Service: Cardiovascular;  Laterality: N/A;  . RIGHT HEART CATH N/A 04/15/2017   Procedure: RIGHT HEART CATH;  Surgeon: Nelva Bush, MD;  Location: [place] [facility];  Service: Cardiovascular;  Laterality: N/A;  . TUBAL LIGATION       Inpatient Medications: Scheduled Meds:  Continuous Infusions:  PRN Meds:   Home Meds: Prior to Admission medications   Medication Sig Start Date End Date Taking? Authorizing Provider  acetaminophen (TYLENOL) 325 MG tablet Take 2 tablets (650 mg total) by mouth every 4 (four) hours as needed for headache or mild pain. 04/21/17   Shirley Friar, PA-C  amiodarone (PACERONE) 200 MG tablet Take 1 tablet (200 mg total) by mouth daily. 06/30/18   Bensimhon, Shaune Pascal, MD  anastrozole (ARIMIDEX) 1 MG tablet Take 1 mg by mouth daily.    [provider]  aspirin 81 MG chewable tablet Chew 81 mg by mouth daily.    [provider]  carvedilol (COREG) 3.125 MG tablet Take 1 tablet (3.125 mg total) by mouth 2 (two) times daily. 08/11/18 11/09/18  Georgiana Shore, NP  Cyanocobalamin (VITAMIN B 12) 100 MCG LOZG Take 1,000 mcg by mouth daily.    [provider]  gabapentin (NEURONTIN) 100 MG capsule Take 100 mg by mouth at bedtime.    [provider]  losartan (COZAAR) 25 MG tablet Take 0.5 tablets (12.5 mg total) by mouth at bedtime. 06/30/18   Bensimhon, Shaune Pascal, MD  oxyCODONE (OXY IR/ROXICODONE) 5 MG immediate release tablet Take 1 tablet (5 mg total) by mouth every 6 (six) hours as needed  for moderate pain. 02/05/18   Oswald Hillock, MD  pantoprazole (PROTONIX) 40 MG tablet Take 1 tablet (40 mg total) by mouth daily. Tome 1 pastilla un vez al dia 02/05/18 08/11/18  Oswald Hillock, MD  spironolactone (ALDACTONE) 25 MG tablet Take 1 tablet (25 mg total) by mouth daily. Tome 1 pastilla un vez a la hora de dormir 03/13/18   Georgiana Shore, NP    Allergies:   No Known Allergies  Social History:   Social History   Socioeconomic History  . Marital status: Married    Spouse name: Not on file  . Number of children: Not on file  . Years of education: Not on file  . Highest education level: Not on file  Occupational History  . Not on file  Social Needs  . Financial resource strain: Not on file  . Food insecurity    Worry: Not on file    Inability: Not on file  . Transportation needs    Medical: Not on file    Non-medical: Not on  file  Tobacco Use  . Smoking status: Never Smoker  . Smokeless tobacco: Never Used  Substance and Sexual Activity  . Alcohol use: Yes    Comment: 04/15/2017 "might have a couple beers q couple months"  . Drug use: No  . Sexual activity: Never  Lifestyle  . Physical activity    Days per week: Not on file    Minutes per session: Not on file  . Stress: Not on file  Relationships  . Social Herbalist on phone: Not on file    Gets together: Not on file    Attends religious service: Not on file    Active member of club or organization: Not on file    Attends meetings of clubs or organizations: Not on file    Relationship status: Not on file  . Intimate partner violence    Fear of current or ex partner: Not on file    Emotionally abused: Not on file    Physically abused: Not on file    Forced sexual activity: Not on file  Other Topics Concern  . Not on file  Social History Narrative  . Not on file    Family History:   The patient's family history includes Hypertension in her mother.  Review of Systems: [y] = yes, [ ]  = no     General: Weight gain [] ; Weight loss [ ] ; Anorexia [ ] ; Fatigue [y]; Fever [ ] ; Chills [ ] ; Weakness Blue.Reese ]   Cardiac: Chest pain/pressure [ ] ; Resting SOB [  ]; Exertional SOB [y]; Orthopnea [ ] ; Pedal Edema [] ; Palpitations Blue.Reese ]; Syncope [ ] ; Presyncope [ ] ; Paroxysmal nocturnal dyspnea[ ]    Pulmonary: Cough [ ] ; Wheezing[ ] ; Hemoptysis[ ] ; Sputum [ ] ; Snoring [ ]    GI: Vomiting[ ] ; Dysphagia[ ] ; Melena[ ] ; Hematochezia [ ] ; Heartburn[ ] ; Abdominal pain [ ] ; Constipation [ ] ; Diarrhea [ ] ; BRBPR [ ]    GU: Hematuria[ ] ; Dysuria [ ] ; Nocturia[ ]   Vascular: Pain in legs with walking [ ] ; Pain in feet with lying flat [ ] ; Non-healing sores [ ] ; Stroke [ ] ; TIA [ ] ; Slurred speech [ ] ;   Neuro: Headaches[y ]; Vertigo[ ] ; Seizures[ ] ; Paresthesias[ ] ;Blurred vision [ ] ; Diplopia [ ] ; Vision changes [ ]    Ortho/Skin: Arthritis [ ] ; Joint pain [ ] ; Muscle pain [ y]; Joint swelling [ ] ; Back Pain [ ] ; Rash [ ]    Psych: Depression[ ] ; Anxiety[ ]    Heme: Bleeding problems [ ] ; Clotting disorders [ ] ; Anemia [ ]    Endocrine: Diabetes [ ] ; Thyroid dysfunction[ ]  .     Physical Exam/Data:   Vitals:   03/05/19 1817  BP: 108/84  Pulse: 75  Temp: 98.4 F (36.9 C)  TempSrc: Oral  SpO2: 97%  Weight: 66.5 kg   No intake or output data in the 24 hours ending 03/05/19 1949 Last 3 Weights 03/05/2019 08/11/2018 06/30/2018  Weight (lbs) 146 lb 9.6 oz 148 lb 144 lb 9.6 oz  Weight (kg) 66.497 kg 67.132 kg 65.59 kg    Body mass index is 25.16 kg/m.  PENDING MD REVIEW General: Well developed, well nourished, in no acute distress. Head: Normocephalic, atraumatic, sclera non-icteric, no xanthomas, nares are without discharge.  Neck: Negative for carotid bruits. JVD not elevated. Lungs: Clear bilaterally to auscultation without wheezes, rales, or rhonchi. Breathing is unlabored. Heart: RRR with S1 S2. No murmurs, rubs, or gallops appreciated. Abdomen: Soft, non-tender, non-distended with  normoactive bowel sounds. No hepatomegaly. No rebound/guarding. No obvious abdominal masses. Msk:  Strength and tone appear normal for age. Extremities: No clubbing or cyanosis. No edema.  Distal pedal pulses are 2+ and equal bilaterally. Neuro: Alert and oriented X 3. No facial asymmetry. No focal deficit. Moves all extremities spontaneously. Psych:  Responds to questions appropriately with a normal affect.  EKG:  No EKG sent with patient but telemetry strips reveal what appears to atrial fib and possibly atrial flutter albeit coarse - EKG here is calling atrial flutter but appears to be variable in appearance therefore I suspect coarse atrial fib 115 bpm, poor R wave progression, no acute STT changes  Labs/Radiology as above  Assessment and Plan:   1. New onset atrial fib with RVR - do not see anticoagulation started at [facility], will discuss plan for NOAC with MD. Likely need to consider TEE DCCV this admission as BP has been soft, limiting aggressive med titration, but do remain somewhat concerned about compliance with anticoagulation thereafter. This will need to be stressed to the patient. She had previous evidence of possible subclinical hyperthyroidism - will repeat TSH and free T4 and total T3 with labs. Consider changing carvedilol to metoprolol for less impact on BP. CHADSVASC at least 2 for CHF, female. Add A1C to r/o DM given hyperglycemia.   2. Acute on chronic systolic CHF/NICM - received 40mg  IV Lasix BID at [facility]. Pending volume assessment. Needs strict I/O's and daily weights with admit orders. Historical BP has been soft, limiting med titration. Suggest echocardiogram when HR is closer to 110 or less. With AF RVR suspect still low.   3. H/o frequent PVCs - follow on telemetry. K on admit labs 3.8; will repeat lytes this PM s/p Lasix with care order to call if K 3.8 or less and Mg 1.8 or less.  4. ? Reported Covid exposure - listed in ED note but patient declines recent  exposure, son tested + but she has not seen for 15 days. Covid negative at [facility]. Will review plan with MD.  5. Abnormal LFTs - follow. ? Hepatic congestion vs r/t amiodarone. Need to trend. If persistently elevated, consider hepatitis panel.  For questions or updates, please contact [place] Please consult [email or web address] for contact info under Cardiology/STEMI.    Signed, Charlie Pitter, PA-C  03/05/2019 7:49 PM  Patient seen and examined with the above-signed Advanced Practice Provider and/or Housestaff. I personally reviewed laboratory data, imaging studies and relevant notes. I independently examined the patient and formulated the important aspects of the plan. I have edited the note to reflect any of my changes or salient points. I have personally discussed the plan with the patient and/or family.  58 y/o woman with known h/o systolic HF due to NICM (? PVC mediated) with EF 20-25% transferred from [facility] for further management of SOB and palpitations. We are asked to consult by Dr. [name] and Dr. Posey Pronto  At [place] found to be in new-onset AFL. Treated with IV cardizem, digoxin and po amiodarone and IV lasix. Main complaint is palpitations and weakness but also has some SOB. Recent exposure to family member with Covid but she has tested negative. Currently afebrile.   On exam. Weak appearing JVP 10-12 with prominent v-waves Cor Tachy irregular 2/6 TR likely soft s3 Lungs: crackles at bases Ab: + distended Ext 1+ edema. Cool   She has new onset AFL with decompensated HF and may be low output. Will start IV amio/Eliquis and continue  IV lasix. Will place PICC to check CVP and co-ox. I suspect she may end up needing a short course of inotropes. Plan TEE/DC-CV later in week when more stable. Will eventually need EP to see for ablation.   Glori Bickers, MD  9:26 PM  Addendum:  Labs c/w with hyperthyroidism. Will need to figure out if she has been taking her amio or  not (will call pharmacy and check level) to see if this might be amio-induced.   Ideally would not use amiodarone in this situation as this will block use of I-131 ablation and may potentiate storm. Will only use amio as last resort. Will d/w EP is am prior to starting amio - may need to go to ablation sooner rather than later. Will need to treat for hyperthyroidism with steroids and methimazole. Hold b-blocker for now with possible low output.   Glori Bickers, MD  9:28 PM

## 2019-03-05 NOTE — H&P (Signed)
History and Physical    Megan Keith ULA:[id number] DOB: [birth year] DOA: 03/05/2019  PCP: Aldona Bar, MD  Patient coming from: [facility]  I have personally briefly reviewed patient's old medical records in [place]  Chief Complaint: Palpitations  HPI: Megan Keith is a 58 y.o. 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 as transfer from [facility] for further evaluation of atrial fibrillation with RVR.  History is facilitated by use of a remote video interpreter.  Patient initially presented to [facility] on 03/04/2019 for reported shortness of breath and nonproductive cough.  She also reported weight gain, orthopnea, and lower extremity edema.  On arrival to [facility], patient complains of continued palpitations without chest pain.  She is feeling lethargic and has noticed having some continued swelling in her legs.  She has intermittent dyspnea.  She denies any subjective fevers, chills, diaphoresis, abdominal pain.  She reports good urine output when she received Lasix.she denies any dysuria.  [facility] course:  Initial vitals heart rate of hospital on 03/04/2019 showed BP 90/64, pulse 1 4, RR 20, temp 98 Fahrenheit, SPO2 98%.  Labs show WBC 9.6, hemoglobin 14.1, platelets 249,000, sodium 135, potassium 3.8, chloride 106, bicarb 20, BUN 14, creatinine 0.60, serum glucose 127, AST 97, ALT 74, alk phos 129, total bilirubin 0.5, troponin I 0.02, proBNP 4450, LDH 696, CRP <5.0, d-dimer 939, ferritin 14.4, lactic acid 1.6, procalcitonin <0.05.    SARS-CoV-2 PCR was negative.  Troponin I 0.02.  EKG reportedly showed A. fib versus a flutter, not sent with paper chart.  In the  ED patient was given IV diltiazem 20 mg slow bolus with heart rate 100-120 and blood pressure 95/45.  Portable chest x-ray showed vascular congestion with haziness airspace opacities.  CTA chest with contrast 03/04/2019 was negative for evidence of PE, pulmonary edema and small effusions were noted.  Patient was admitted for acute decompensated systolic heart failure.  Per documentation did not appears that she had been on any diuretics prior to admission.  She was started on IV Lasix 40 mg twice daily with 2400 cc of urine output recorded.  She was restarted on home carvedilol and oral amiodarone for her new onset atrial fibrillation with RVR.  Systolic blood pressure dropped to 84 after receiving IV Lasix this a.m.  She was given IV digoxin 0.25 mg once without significant improvement.  Cardiology, Dr. [name], was consulted who recommended transfer to [facility] for possible TEE guided cardioversion 2D echocardiogram was performed however results were pending and not available in paper chart.   Review of Systems: All systems reviewed and are negative except as documented in history of present illness above.   Past Medical History:  Diagnosis Date  . Abnormal TSH   . Anemia   . Arthritis    "knees, hands" (04/15/2017)  . Breast cancer ([place])   . Chronic knee pain   . Chronic systolic CHF (congestive heart failure) ([place])   . Does not have health insurance   . Frequent PVCs   . NICM (nonischemic cardiomyopathy) ([place])    a. ? PVC cardiomyopathy. right and left heart catheterization on 04/15/17, no CAD. Fick output/index 5.28/3.34.  . Noncompliance    a. episodic noncompliance.  . Pneumonia 06/2016    Past Surgical  History:  Procedure Laterality Date  . BREAST CYST EXCISION Left   . CORONARY/GRAFT ANGIOGRAPHY N/A 04/15/2017   Procedure: CORONARY/GRAFT ANGIOGRAPHY;  Surgeon: Nelva Bush, MD;  Location: [place] [facility];  Service: Cardiovascular;  Laterality: N/A;  . RIGHT HEART  CATH N/A 04/15/2017   Procedure: RIGHT HEART CATH;  Surgeon: Nelva Bush, MD;  Location: [place] [facility];  Service: Cardiovascular;  Laterality: N/A;  . TUBAL LIGATION      Social History:  reports that she has never smoked. She has never used smokeless tobacco. She reports current alcohol use. She reports that she does not use drugs.  No Known Allergies  Family History  Problem Relation Age of Onset  . Hypertension Mother      Prior to Admission medications   Medication Sig Start Date End Date Taking? Authorizing Provider  acetaminophen (TYLENOL) 325 MG tablet Take 2 tablets (650 mg total) by mouth every 4 (four) hours as needed for headache or mild pain. 04/21/17   Shirley Friar, PA-C  amiodarone (PACERONE) 200 MG tablet Take 1 tablet (200 mg total) by mouth daily. 06/30/18   Bensimhon, Shaune Pascal, MD  anastrozole (ARIMIDEX) 1 MG tablet Take 1 mg by mouth daily.    [provider]  aspirin 81 MG chewable tablet Chew 81 mg by mouth daily.    [provider]  carvedilol (COREG) 3.125 MG tablet Take 1 tablet (3.125 mg total) by mouth 2 (two) times daily. 08/11/18 11/09/18  Georgiana Shore, NP  Cyanocobalamin (VITAMIN B 12) 100 MCG LOZG Take 1,000 mcg by mouth daily.    [provider]  gabapentin (NEURONTIN) 100 MG capsule Take 100 mg by mouth at bedtime.    [provider]  losartan (COZAAR) 25 MG tablet Take 0.5 tablets (12.5 mg total) by mouth at bedtime. 06/30/18   Bensimhon, Shaune Pascal, MD  oxyCODONE (OXY IR/ROXICODONE) 5 MG immediate release tablet Take 1 tablet (5 mg total) by mouth every 6 (six) hours as needed for moderate pain. 02/05/18   Oswald Hillock, MD  pantoprazole (PROTONIX) 40 MG tablet Take 1 tablet (40 mg total) by mouth daily. Tome 1 pastilla un vez al dia 02/05/18 08/11/18  Oswald Hillock, MD  spironolactone (ALDACTONE) 25 MG tablet Take 1 tablet (25 mg total) by mouth daily. Tome 1 pastilla un vez a la hora de dormir 03/13/18    Georgiana Shore, NP    Physical Exam: Vitals:   03/05/19 1817 03/05/19 2026  BP: 108/84 95/80  Pulse: 75 (!) 116  Resp:  18  Temp: 98.4 F (36.9 C) 98.9 F (37.2 C)  TempSrc: Oral Oral  SpO2: 97% 98%  Weight: 66.5 kg     Constitutional: Resting supine in bed with head slightly elevated, NAD, calm, comfortable, appears tired Eyes: PERRL, lids and conjunctivae normal ENMT: Mucous membranes are moist. Posterior pharynx clear of any exudate or lesions.Normal dentition.  Neck: normal, supple, no masses. Respiratory: clear to auscultation bilaterally, no wheezing, no crackles. Normal respiratory effort. No accessory muscle use.  Cardiovascular: Irregularly irregular with tachycardia, rate 120-150s, trace lower extremity edema Abdomen: no tenderness, no masses palpated. No hepatosplenomegaly. Bowel sounds positive.  Musculoskeletal: no clubbing / cyanosis. No joint deformity upper and lower extremities. Good ROM, no contractures. Normal muscle tone.  Skin: no rashes, lesions, ulcers. No induration Neurologic: CN 2-12 grossly intact. Sensation intact, Strength 5/5 in all 4.  Psychiatric: Normal judgment and insight. Alert and oriented x 3. Normal mood.  Labs on Admission: I have personally reviewed following labs and imaging studies  CBC: Recent Labs  Lab 03/05/19 2017  WBC 9.4  HGB 14.5  HCT 42.7  MCV 92.8  PLT 166   Basic Metabolic Panel: Recent Labs  Lab 03/05/19 2017  NA 139  K 3.2*  CL 103  CO2 25  GLUCOSE 121*  BUN 12  CREATININE 0.70  CALCIUM 9.4  MG 2.2   GFR: CrCl cannot be calculated (Unknown ideal weight.). Liver Function Tests: Recent Labs  Lab 03/05/19 2017  AST 31  ALT 60*  ALKPHOS 106  BILITOT 1.0  PROT 6.7  ALBUMIN 3.8   No results for input(s): LIPASE, AMYLASE in the last 168 hours. No results for input(s): AMMONIA in the last 168 hours. Coagulation Profile: No results for input(s): INR, PROTIME in the last 168 hours. Cardiac  Enzymes: No results for input(s): CKTOTAL, CKMB, CKMBINDEX, TROPONINI in the last 168 hours. BNP (last 3 results) No results for input(s): PROBNP in the last 8760 hours. HbA1C: No results for input(s): HGBA1C in the last 72 hours. CBG: No results for input(s): GLUCAP in the last 168 hours. Lipid Profile: No results for input(s): CHOL, HDL, LDLCALC, TRIG, CHOLHDL, LDLDIRECT in the last 72 hours. Thyroid Function Tests: Recent Labs    03/05/19 2017  TSH 0.011*   Anemia Panel: No results for input(s): VITAMINB12, FOLATE, FERRITIN, TIBC, IRON, RETICCTPCT in the last 72 hours. Urine analysis: No results found for: COLORURINE, APPEARANCEUR, LABSPEC, PHURINE, GLUCOSEU, HGBUR, BILIRUBINUR, KETONESUR, PROTEINUR, UROBILINOGEN, NITRITE, LEUKOCYTESUR  Radiological Exams on Admission: No results found.  EKG: Independently reviewed. Atrial flutter with variable block.  Arrhythmia is new compared to prior.  Assessment/Plan Principal Problem:   Atrial fibrillation with RVR (HCC) Active Problems:   Acute on chronic HFrEF (heart failure with reduced ejection fraction) ([place])   Hypotension  Nihira Puello [name] is a 58 y.o. 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   Atrial flutter with RVR: New onset, uncontrolled with initial therapy.  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  Unclear if patient has been taking home amiodarone.  Cardiology following, plan for TEE/DC cardioversion once patient more stable. -Amiodarone on hold due to concern for amiodarone associated thyrotoxicity -Unable to use beta-blockers due to concern for low output  heart failure, hypertension -Appreciate cardiology assistance, plan for PICC line placement and EP consultation -Started on Eliquis 5 mg twice daily  Acute on chronic HFrEF exacerbation: -Started on IV Lasix 80 mg twice daily -Monitor electrolytes and renal function -Strict I/O's, daily weights  Hyperthyroidism: TSH 0.011, free T4 2.43, T3 pending.  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 -Started on methimazole 40 mg daily and prednisone 40 mg daily -Follow-up amiodarone level and T3  Hypokalemia: K 3.2, will replete orally.  Magnesium 2.2.  Hypotension with history of hypertension: BPs remain soft from low output. -Home losartan, spironolactone, carvedilol on hold -On IV Lasix as above  Elevated LFTs: Mildly elevated at outside hospital, repeat labs on admission showed AST 31, ALT 60.  Continue to monitor.  History of COVID-19 exposure: SARS-CoV-2 test negative at [facility].  Will repeat here.  Low suspicion for symptomatic COVID-19 infection at this time   DVT  prophylaxis: Eliquis Code Status: Full code, confirmed with patient Family Communication: None present on admission Disposition Plan: Pending clinical progress Consults called: Cardiology following Admission status: Inpatient for continued management of A. fib/flutter with RVR, acute on chronic systolic CHF exacerbation, and cardiology consultation and intervention.   Zada Finders MD [facility]  If 7PM-7AM, please contact night-coverage [email or web address]  03/05/2019, 9:12 PM

## 2019-03-05 NOTE — Progress Notes (Signed)
Dr Posey Pronto made aware of patient arriving on unit and stated he will come assess patient shortly.

## 2019-03-06 ENCOUNTER — Inpatient Hospital Stay (HOSPITAL_COMMUNITY): Payer: Medicaid Other

## 2019-03-06 ENCOUNTER — Encounter (HOSPITAL_COMMUNITY): Payer: Self-pay

## 2019-03-06 DIAGNOSIS — E876 Hypokalemia: Secondary | ICD-10-CM

## 2019-03-06 LAB — CBC
HCT: 45.8 % (ref 36.0–46.0)
Hemoglobin: 15.5 g/dL — ABNORMAL HIGH (ref 12.0–15.0)
MCH: 31.7 pg (ref 26.0–34.0)
MCHC: 33.8 g/dL (ref 30.0–36.0)
MCV: 93.7 fL (ref 80.0–100.0)
Platelets: 342 10*3/uL (ref 150–400)
RBC: 4.89 MIL/uL (ref 3.87–5.11)
RDW: 12.6 % (ref 11.5–15.5)
WBC: 7.9 10*3/uL (ref 4.0–10.5)
nRBC: 0 % (ref 0.0–0.2)

## 2019-03-06 LAB — HEMOGLOBIN A1C
Hgb A1c MFr Bld: 5.6 % (ref 4.8–5.6)
Mean Plasma Glucose: 114.02 mg/dL

## 2019-03-06 LAB — COMPREHENSIVE METABOLIC PANEL
ALT: 59 U/L — ABNORMAL HIGH (ref 0–44)
AST: 28 U/L (ref 15–41)
Albumin: 4 g/dL (ref 3.5–5.0)
Alkaline Phosphatase: 109 U/L (ref 38–126)
Anion gap: 10 (ref 5–15)
BUN: 13 mg/dL (ref 6–20)
CO2: 22 mmol/L (ref 22–32)
Calcium: 10 mg/dL (ref 8.9–10.3)
Chloride: 107 mmol/L (ref 98–111)
Creatinine, Ser: 0.74 mg/dL (ref 0.44–1.00)
GFR calc Af Amer: >60 mL/min (ref >60)
GFR calc non Af Amer: >60 mL/min (ref >60)
Glucose, Bld: 149 mg/dL — ABNORMAL HIGH (ref 70–99)
Potassium: 4.6 mmol/L (ref 3.5–5.1)
Sodium: 139 mmol/L (ref 135–145)
Total Bilirubin: 0.9 mg/dL (ref 0.3–1.2)
Total Protein: 7 g/dL (ref 6.5–8.1)

## 2019-03-06 LAB — COOXEMETRY PANEL
Carboxyhemoglobin: 0.9 % (ref 0.5–1.5)
Methemoglobin: 0.4 % (ref 0.0–1.5)
O2 Saturation: 63.1 %
Total hemoglobin: 15.1 g/dL (ref 12.0–16.0)

## 2019-03-06 LAB — HIV ANTIBODY (ROUTINE TESTING W REFLEX): HIV Screen 4th Generation wRfx: NONREACTIVE

## 2019-03-06 LAB — MAGNESIUM: Magnesium: 2.1 mg/dL (ref 1.7–2.4)

## 2019-03-06 MED ORDER — METOPROLOL TARTRATE 5 MG/5ML IV SOLN
INTRAVENOUS | Status: AC
  Administered 2019-03-06: 12:00:00 via INTRAVENOUS
  Filled 2019-03-06: qty 5

## 2019-03-06 MED ORDER — METOPROLOL TARTRATE 5 MG/5ML IV SOLN
2.5000 mg | Freq: Four times a day (QID) | INTRAVENOUS | Status: DC | PRN
  Filled 2019-03-06 (×2): qty 5

## 2019-03-06 MED ORDER — DILTIAZEM HCL-DEXTROSE 100-5 MG/100ML-% IV SOLN (PREMIX)
5.0000 mg/h | INTRAVENOUS | Status: DC
  Administered 2019-03-06: 12:00:00 5 mg/h via INTRAVENOUS

## 2019-03-06 MED ORDER — LIP MEDEX EX OINT
TOPICAL_OINTMENT | CUTANEOUS | Status: DC | PRN
  Filled 2019-03-06: qty 7

## 2019-03-06 MED ORDER — METOPROLOL TARTRATE 5 MG/5ML IV SOLN
5.0000 mg | INTRAVENOUS | Status: DC | PRN
  Administered 2019-03-06: 12:00:00 via INTRAVENOUS
  Administered 2019-03-06: 5 mg via INTRAVENOUS

## 2019-03-06 MED ORDER — SODIUM CHLORIDE 0.9% FLUSH: 10.0000 mL | INTRAVENOUS | Status: DC | PRN

## 2019-03-06 MED ORDER — DIGOXIN 250 MCG PO TABS
0.2500 mg | ORAL_TABLET | Freq: Every day | ORAL | Status: DC
  Administered 2019-03-06 – 2019-03-10 (×5): 0.25 mg via ORAL
  Filled 2019-03-06 (×5): qty 1

## 2019-03-06 MED ORDER — SODIUM CHLORIDE 0.9% FLUSH
10.0000 mL | Freq: Two times a day (BID) | INTRAVENOUS | Status: DC
  Administered 2019-03-06 – 2019-03-07 (×3): 10 mL
  Administered 2019-03-07: 12:00:00 30 mL
  Administered 2019-03-08 – 2019-03-10 (×4): 10 mL

## 2019-03-06 MED ORDER — SODIUM CHLORIDE 0.9 % IV SOLN
INTRAVENOUS | Status: DC | PRN
  Administered 2019-03-06: 12:00:00 250 mL via INTRAVENOUS

## 2019-03-06 NOTE — Progress Notes (Signed)
Responded to spiritual care consult. Nurse was leaving pt room and stated that pt was about to get a procedure done by another Nurse. Nurse stated someone came earlier and pt requested information about the AD. Chaplain will follow up later.  Chaplain Fidel Levy  [phone]

## 2019-03-06 NOTE — Progress Notes (Signed)
Attempted to contact IV team to see when PICC will be placed. No response at this time.

## 2019-03-06 NOTE — Progress Notes (Signed)
Peripherally Inserted Central Catheter/Midline Placement  The IV Nurse has discussed with the patient and/or persons authorized to consent for the patient, the purpose of this procedure and the potential benefits and risks involved with this procedure.  The benefits include less needle sticks, lab draws from the catheter, and the patient may be discharged home with the catheter. Risks include, but not limited to, infection, bleeding, blood clot (thrombus formation), and puncture of an artery; nerve damage and irregular heartbeat and possibility to perform a PICC exchange if needed/ordered by physician.  Alternatives to this procedure were also discussed.  Bard Power PICC patient education guide, fact sheet on infection prevention and patient information card has been provided to patient /or left at bedside.  Consent obtain with interpretation phone. ta PICC/Midline Placement Documentation  PICC Triple Lumen 03/06/19 PICC Right Brachial 39 cm 0 cm (Active)  Indication for Insertion or Continuance of Line Vasoactive infusions 03/06/19 1353  Exposed Catheter (cm) 0 cm 03/06/19 1353  Site Assessment Clean;Dry;Intact 03/06/19 1353  Lumen #1 Status Flushed;Saline locked;Blood return noted 03/06/19 1353  Lumen #2 Status Flushed;Saline locked;Blood return noted 03/06/19 1353  Lumen #3 Status Flushed;Saline locked;Blood return noted 03/06/19 1353  Dressing Type Transparent 03/06/19 1353  Dressing Status Clean;Dry;Intact;Antimicrobial disc in place 03/06/19 1353  Dressing Change Due 03/13/19 03/06/19 1353       Gordan Payment 03/06/2019, 1:54 PM

## 2019-03-06 NOTE — Progress Notes (Signed)
Spoke with Colin Ina, RN. He stated CXR showed PICC was in place and PICC was now ready for use.

## 2019-03-06 NOTE — Progress Notes (Signed)
PROGRESS NOTE    Megan Keith  CVE:[id number] DOB: 1961/03/10 DOA: 03/05/2019 PCP: Aldona Bar, MD    Brief Narrative:  58 year old female who presented with palpitations.  She is known to have the significant past medical history for nonischemic cardiomyopathy, ejection fraction 25 to 30%, hypertension, GERD, and left breast cancer status post lumpectomy and adjuvant radiation/hormonal therapy.  She reported weight gain, orthopnea and lower extremity edema.  On her initial physical examination blood pressure was 90/64, 108/84, heart rate 116-120-150, respirate 18, temperature 98.9, oxygen saturation 98%.  She had moist mucous membranes, her lungs were clear to auscultation bilaterally, heart S1-S2 present irregularly irregular 120-150, her abdomen was soft, trace lower extremity edema.  Sodium 139, potassium 3.2, chloride 103, bicarb 25, glucose 121, BUN 12, creatinine 0.7, recent 2.2, white count 9.4, hemoglobin 14.5, hematocrit 42.7, platelets 316, TSH 0.011, free T4 2.4, SARS COVID-19 was negative.  EKG had 115 bpm, atrial flutter with variable block, no ST segment or T wave changes.  Her chest x-ray had vascular congestion, cardiomegaly, CT chest negative for pulmonary embolism, positive pulmonary edema.  Patient was admitted to the hospital working diagnosis acute on chronic systolic heart failure exacerbation complicated by atrial fibrillation with rapid ventricular response.   Assessment & Plan:   Principal Problem:   Atrial fibrillation with RVR (HCC) Active Problems:   Acute on chronic HFrEF (heart failure with reduced ejection fraction) (HCC)   Hypotension   Hyperthyroidism   1.  Atrial fibrillation with rapid ventricular response. Patient developed symptomatic atrial fibrillation HR 170 bpm, near syncope symptoms. Blood pressure 025 systolic. Responded well to IV metoprolol 5 mg with HR less than 130 bpm. Patient had amiodarone toxicity. Will order low dose diltiazem  drip for heart rate control, I am concerned about worsening RVR and hemodynamic decompensation. Continue anticoagulation with apixaban.   2. Acute on chronic systolic heart failure decompensation. Patient has received furosemide on admission, exam this am improved volume status, but not yet back to baseline, not recorded urine output. Continue heart rate control. Tolerated well metoprolol, will continue low dose diltiazem. Note patient did not tolerated amiodarone.   3. Hyperthyroidism. Suspected amiodarone toxicity, clinically no tremors, fever, or gastrointestinal symptoms, no signs of thyroid storm, of worsening symptoms will consider propranolol therapy. On methimazole and prednisone for now. Follow TSH in am.   4. Hypokalemia. Continue k correction with Kcl, will follow on renal panel in am, keep K at 4 and Mg at 2.    DVT prophylaxis: apixaban   Code Status: full Family Communication: no family at the bedside  Disposition Plan/ discharge barriers: pending clinical improvement.   Patient is critically ill with acute decompensation of atrial fibrillation, continue medical management to prevent imminent deterioration  Critical care time 45 minutes.   Body mass index is 25.35 kg/m. Malnutrition Type:      Malnutrition Characteristics:      Nutrition Interventions:     RN Pressure Injury Documentation:     Consultants:   Cardiology   Procedures:     Antimicrobials:       Subjective: Patient not feeling well, positive dizziness, and near syncope episode HR in the 170's with blood pressure systolic more than 852.   Objective: Vitals:   03/05/19 2300 03/06/19 0534 03/06/19 0930 03/06/19 1151  BP: 92/66 103/81 100/79 106/89  Pulse:  (!) 126 (!) 137   Resp:  20    Temp:  98.3 F (36.8 C) 98.2 F (36.8 C)  TempSrc:  Oral Oral   SpO2:   98%   Weight:  67 kg      Intake/Output Summary (Last 24 hours) at 03/06/2019 1214 Last data filed at 03/06/2019 1101  Gross per 24 hour  Intake 461 ml  Output 1300 ml  Net -839 ml   Filed Weights   03/05/19 1817 03/06/19 0534  Weight: 66.5 kg 67 kg    Examination:   General: ill looking appearing  Neurology: Awake and alert, non focal  E ENT: mild pallor, no icterus, oral mucosa moist Cardiovascular: No JVD. S1-S2 present, tachycardic irregularly irregular, no gallops, rubs, or murmurs. Trace lower extremity edema. Pulmonary: positive breath sounds bilaterally, adequate air movement, no wheezing, rhonchi or rales. Gastrointestinal. Abdomen with no organomegaly, non tender, no rebound or guarding Skin. No rashes Musculoskeletal: no joint deformities     Data Reviewed: I have personally reviewed following labs and imaging studies  CBC: Recent Labs  Lab 03/05/19 2017 03/06/19 0522  WBC 9.4 7.9  HGB 14.5 15.5*  HCT 42.7 45.8  MCV 92.8 93.7  PLT 316 802   Basic Metabolic Panel: Recent Labs  Lab 03/05/19 2017 03/06/19 0522  NA 139 139  K 3.2* 4.6  CL 103 107  CO2 25 22  GLUCOSE 121* 149*  BUN 12 13  CREATININE 0.70 0.74  CALCIUM 9.4 10.0  MG 2.2 2.1   GFR: CrCl cannot be calculated (Unknown ideal weight.). Liver Function Tests: Recent Labs  Lab 03/05/19 2017 03/06/19 0522  AST 31 28  ALT 60* 59*  ALKPHOS 106 109  BILITOT 1.0 0.9  PROT 6.7 7.0  ALBUMIN 3.8 4.0   No results for input(s): LIPASE, AMYLASE in the last 168 hours. No results for input(s): AMMONIA in the last 168 hours. Coagulation Profile: No results for input(s): INR, PROTIME in the last 168 hours. Cardiac Enzymes: No results for input(s): CKTOTAL, CKMB, CKMBINDEX, TROPONINI in the last 168 hours. BNP (last 3 results) No results for input(s): PROBNP in the last 8760 hours. HbA1C: Recent Labs    03/06/19 0522  HGBA1C 5.6   CBG: No results for input(s): GLUCAP in the last 168 hours. Lipid Profile: No results for input(s): CHOL, HDL, LDLCALC, TRIG, CHOLHDL, LDLDIRECT in the last 72 hours. Thyroid  Function Tests: Recent Labs    03/05/19 2017  TSH 0.011*  FREET4 2.43*   Anemia Panel: No results for input(s): VITAMINB12, FOLATE, FERRITIN, TIBC, IRON, RETICCTPCT in the last 72 hours.    Radiology Studies: I have reviewed all of the imaging during this hospital visit personally     Scheduled Meds: . apixaban  5 mg Oral BID  . methimazole  40 mg Oral Daily  . metoprolol tartrate      . predniSONE  40 mg Oral Q breakfast  . sodium chloride flush  3 mL Intravenous Q12H   Continuous Infusions: . sodium chloride 250 mL (03/06/19 1212)  . diltiazem (CARDIZEM) infusion       LOS: 1 day        Blythe Hartshorn Gerome Apley, MD

## 2019-03-06 NOTE — Progress Notes (Addendum)
Advanced Heart Failure Rounding Note   Subjective:    Started on prednisone and methimazole last night for hyperthyroidism.  Remains in AF with RVR with rates 120-160. Has had mild diuresis. Says she is feeling a bit better but still very weak and SOB with any activity. + palpitations     Objective:   Weight Range:  Vital Signs:   Temp:  [98.2 F (36.8 C)-98.9 F (37.2 C)] 98.2 F (36.8 C) (07/14 0930) Pulse Rate:  [75-137] 137 (07/14 0930) Resp:  [18-20] 20 (07/14 0534) BP: (92-110)/(66-89) 102/88 (07/14 1234) SpO2:  [97 %-98 %] 98 % (07/14 0930) Weight:  [66.5 kg-67 kg] 67 kg (07/14 0534) Last BM Date: 03/06/19  Weight change: Filed Weights   03/05/19 1817 03/06/19 0534  Weight: 66.5 kg 67 kg    Intake/Output:   Intake/Output Summary (Last 24 hours) at 03/06/2019 1300 Last data filed at 03/06/2019 1101 Gross per 24 hour  Intake 461 ml  Output 1300 ml  Net -839 ml     Physical Exam: General:  Weak appearing. No resp difficulty HEENT: normal Neck: supple. JVP 9. Carotids 2+ bilat; no bruits. No lymphadenopathy or obvious goiter appreciated. Cor: PMI nondisplaced. IRR tachy +s3  Lungs: clear Abdomen: soft, nontender, nondistended. No hepatosplenomegaly. No bruits or masses. Good bowel sounds. Extremities: no cyanosis, clubbing, rash, edema Neuro: alert & orientedx3, cranial nerves grossly intact. moves all 4 extremities w/o difficulty. Affect pleasant  Telemetry: Afib 120-150s Personally reviewed   Labs: Basic Metabolic Panel: Recent Labs  Lab 03/05/19 2017 03/06/19 0522  NA 139 139  K 3.2* 4.6  CL 103 107  CO2 25 22  GLUCOSE 121* 149*  BUN 12 13  CREATININE 0.70 0.74  CALCIUM 9.4 10.0  MG 2.2 2.1    Liver Function Tests: Recent Labs  Lab 03/05/19 2017 03/06/19 0522  AST 31 28  ALT 60* 59*  ALKPHOS 106 109  BILITOT 1.0 0.9  PROT 6.7 7.0  ALBUMIN 3.8 4.0   No results for input(s): LIPASE, AMYLASE in the last 168 hours. No results  for input(s): AMMONIA in the last 168 hours.  CBC: Recent Labs  Lab 03/05/19 2017 03/06/19 0522  WBC 9.4 7.9  HGB 14.5 15.5*  HCT 42.7 45.8  MCV 92.8 93.7  PLT 316 342    Cardiac Enzymes: No results for input(s): CKTOTAL, CKMB, CKMBINDEX, TROPONINI in the last 168 hours.  BNP: BNP (last 3 results) No results for input(s): BNP in the last 8760 hours.  ProBNP (last 3 results) No results for input(s): PROBNP in the last 8760 hours.    Other results:  Imaging: [place] Ekg Site Rite  Result Date: 03/05/2019 If Site Rite image not attached, placement could not be confirmed due to current cardiac rhythm.     Medications:     Scheduled Medications: . apixaban  5 mg Oral BID  . digoxin  0.25 mg Oral Daily  . methimazole  40 mg Oral Daily  . metoprolol tartrate      . predniSONE  40 mg Oral Q breakfast  . sodium chloride flush  3 mL Intravenous Q12H     Infusions: . sodium chloride 250 mL (03/06/19 1212)     PRN Medications:  sodium chloride, metoprolol tartrate   Assessment/Plan:   1. New onset atrial fib with RVR in setting of hyperthyroidism - she remains very tenuous - strips reviewed with EP. 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 Tikosyn may also be an option - will start digoxin and low-dose IV lopressor - will plan TEE/DC-CV once she has had at least 3 doses of Eliquis and thyroid has been treated for several days as she will be unlikely to hold sinus if thyroid still activated - may be able to use Tikosyn short-term if she can afford. Will d/w PharmD - avoid cardizem - EP to see  2. Acute on chronic systolic HF insetting of #1 - EF 20-25% NICM (felt to be PVC  mediated) - volume status looks a bit better today - I worry about low output - Awaiting PICC to measure CVP and co-ox. - may need inotropes  - BP too soft for ACE/ARB - hold lasix for now  3. Hyperthyroidism - discussion as above - I d/w Endo - Continue prednisone 40 and methimazole 40 daily for now. 20mg  daily for each on d/c - Check thyroid u/s  4. Hypokalemia - supp  4. H/o frequent PVCs    CRITICAL CARE Performed by: Glori Bickers  Total critical care time: 35 minutes  Critical care time was exclusive of separately billable procedures and treating other patients.  Critical care was necessary to treat or prevent imminent or life-threatening deterioration.  Critical care was time spent personally by me (independent of midlevel providers or residents) on the following activities: development of treatment plan with patient and/or surrogate as well as nursing, discussions with consultants, evaluation of patient's response to treatment, examination of patient, obtaining history from patient or surrogate, ordering and performing treatments and interventions, ordering and review of laboratory studies, ordering and review of radiographic studies, pulse oximetry and re-evaluation of patient's condition.  Length of Stay: 1   Glori Bickers MD 03/06/2019, 1:00 PM  Advanced Heart Failure Team Pager [phone] (M-F; [place])  Please contact [facility] for night-coverage after hours (4p -7a ) and weekends on [email or web address]  Addendum:  PICC placed this afternoon co-ox 63% reflecting adequate cardiac output. CVP 11. D/w EP. Will use digoxin and b-blocker for now. Suspect she will need to start Tikosyn in am so that she will maintain NSR after DC-CV.   Glori Bickers, MD  9:44 PM

## 2019-03-06 NOTE — Consult Note (Addendum)
Cardiology Consultation:   Patient ID: Megan Keith MRN: [id number]; DOB: 1961-03-02  Admit date: 03/05/2019 Date of Consult: 03/06/2019  Primary Care Provider: Aldona Bar, MD Primary Cardiologist: Glori Bickers, MD  Primary Electrophysiologist:  None    Patient Profile:   Megan Keith is a 58 y.o. female with a hx of arthritis, chronic pain, breast cancer s/p XRT, on Arimidex, NICM felt 2/2 PVCs, chronic CHF (systolic) BP has limited GDMT who is being seen today for the evaluation of AFlutter, rhythm control options at the request of Dr. Haroldine Laws.   AAD history Started on amiodarone 03/2017 Appears taking somewhat intermittently  History of Present Illness:    HPI visit is done with the use of [employer] [name], # [id number]   Ms. Benjume [name] initially presented to [facility] with symptoms of a constellation of symptoms, myalgias, cough, a day of reported fever, palpitations, worsening lethargy and dizziness on standing.   She reported COVID positive family members.  She however tested negative.  CXR was c/w CHF, CT chest negative for PE, she was in AFib w/RVR. K+ 3.8,BUN/Creat 14/0.60, Trop I 0.02, COVID negative 03/04/2019, BNP 4450, WBC 9.6, mag 9.5  [facility] records not that the patient reported bing out of her medicines for 2 1/2 weeks prior to coming in.  They were unable to gain rate control, the patient with persistent hypotension, and ultimately transferred to [facility] yesterday to CHF service.  EP is asked to weigh in on rhythm control options with suspect AFlutter.  She has been found hyperthyroid, started on prednisone and Tapazole for.  She has been on amiodarone so must suspect this.  The patient is feeling a bit better then when she first went to [place], though not to h er baseline.  No rest SOB, no CP, still fatigued, tired.  Today's labs K+ 4.6 BUN/Creat 13/0.74 Mag 2.1 WBC 7.9H/H 15/45 Plts 342  TSH 0.011  Free T4 2.43  03/05/2019 COVID negative here (#2)  A/c  Eliquis 5mg  BID (appears started here last night) HR/rhythm meds Dig 0.25mg  daily Dilt gtt >. Stopped PRN IV lopressor   Heart Pathway Score:     Past Medical History:  Diagnosis Date  . Abnormal TSH   . Anemia   . Arthritis    "knees, hands" (04/15/2017)  . Breast cancer ([place])   . Chronic knee pain   . Chronic systolic CHF (congestive heart failure) ([place])   . Does not have health insurance   . Frequent PVCs   . NICM (nonischemic cardiomyopathy) ([place])    a. ? PVC cardiomyopathy. right and left heart catheterization on 04/15/17, no CAD. Fick output/index 5.28/3.34.  . Noncompliance    a. episodic noncompliance.  . Pneumonia 06/2016    Past Surgical History:  Procedure Laterality Date  . BREAST CYST EXCISION Left   . CORONARY/GRAFT ANGIOGRAPHY N/A 04/15/2017   Procedure: CORONARY/GRAFT ANGIOGRAPHY;  Surgeon: Nelva Bush, MD;  Location: [facility];  Service: Cardiovascular;  Laterality: N/A;  . RIGHT HEART CATH N/A 04/15/2017   Procedure: RIGHT HEART CATH;  Surgeon: Nelva Bush, MD;  Location: [facility];  Service: Cardiovascular;  Laterality: N/A;  . TUBAL LIGATION       Home Medications:  Prior to Admission medications   Medication Sig Start Date End Date Taking? Authorizing Provider  acetaminophen (TYLENOL) 325 MG tablet Take 2 tablets (650 mg total) by mouth every 4 (four) hours as needed for headache or mild pain. 04/21/17  Yes  Shirley Friar, PA-C  amiodarone (PACERONE) 200 MG tablet Take 1 tablet (200 mg total) by mouth daily. 06/30/18  Yes Bensimhon, Shaune Pascal, MD  anastrozole (ARIMIDEX) 1 MG tablet Take 1 mg by mouth daily.   Yes [provider]  aspirin 81 MG chewable tablet Chew 81 mg by mouth daily.   Yes [provider]  carvedilol (COREG) 3.125 MG tablet Take 1 tablet (3.125 mg total) by mouth 2 (two) times daily. 08/11/18 03/06/19 Yes Georgiana Shore, NP   Cyanocobalamin (VITAMIN B 12) 500 MCG TABS Take 1,000 mcg by mouth daily.    Yes [provider]  losartan (COZAAR) 25 MG tablet Take 0.5 tablets (12.5 mg total) by mouth at bedtime. 06/30/18  Yes Bensimhon, Shaune Pascal, MD  pantoprazole (PROTONIX) 40 MG tablet Take 1 tablet (40 mg total) by mouth daily. Tome 1 pastilla un vez al dia 02/05/18 03/06/19 Yes Oswald Hillock, MD  spironolactone (ALDACTONE) 25 MG tablet Take 1 tablet (25 mg total) by mouth daily. Tome 1 pastilla un vez a la hora de dormir 03/13/18  Yes Georgiana Shore, NP    Inpatient Medications: Scheduled Meds: . apixaban  5 mg Oral BID  . digoxin  0.25 mg Oral Daily  . methimazole  40 mg Oral Daily  . predniSONE  40 mg Oral Q breakfast  . sodium chloride flush  3 mL Intravenous Q12H   Continuous Infusions: . sodium chloride 250 mL (03/06/19 1212)   PRN Meds: sodium chloride, metoprolol tartrate  Allergies:   No Known Allergies  Social History:   Social History   Socioeconomic History  . Marital status: Married    Spouse name: Not on file  . Number of children: Not on file  . Years of education: Not on file  . Highest education level: Not on file  Occupational History  . Not on file  Social Needs  . Financial resource strain: Not on file  . Food insecurity    Worry: Not on file    Inability: Not on file  . Transportation needs    Medical: Not on file    Non-medical: Not on file  Tobacco Use  . Smoking status: Never Smoker  . Smokeless tobacco: Never Used  Substance and Sexual Activity  . Alcohol use: Yes    Comment: 04/15/2017 "might have a couple beers q couple months"  . Drug use: No  . Sexual activity: Never  Lifestyle  . Physical activity    Days per week: Not on file    Minutes per session: Not on file  . Stress: Not on file  Relationships  . Social Herbalist on phone: Not on file    Gets together: Not on file    Attends religious service: Not on file    Active member of club or  organization: Not on file    Attends meetings of clubs or organizations: Not on file    Relationship status: Not on file  . Intimate partner violence    Fear of current or ex partner: Not on file    Emotionally abused: Not on file    Physically abused: Not on file    Forced sexual activity: Not on file  Other Topics Concern  . Not on file  Social History Narrative  . Not on file    Family History:   Family History  Problem Relation Age of Onset  . Hypertension Mother      ROS:  Please see the history of present illness.  All other ROS reviewed and negative.     Physical Exam/Data:   Vitals:   03/06/19 1249 03/06/19 1307 03/06/19 1309 03/06/19 1357  BP: 99/79  99/85 131/63  Pulse:  (!) 123    Resp:      Temp:      TempSrc:      SpO2:      Weight:        Intake/Output Summary (Last 24 hours) at 03/06/2019 1413 Last data filed at 03/06/2019 1309 Gross per 24 hour  Intake 461 ml  Output 1700 ml  Net -1239 ml   Last 3 Weights 03/06/2019 03/05/2019 08/11/2018  Weight (lbs) 147 lb 11.3 oz 146 lb 9.6 oz 148 lb  Weight (kg) 67 kg 66.497 kg 67.132 kg     Body mass index is 25.35 kg/m.  General:  Well nourished, well developed, in no acute distress HEENT: normal Lymph: no adenopathy Neck: + JVD Endocrine:  No thryomegaly Vascular: No carotid bruits Cardiac:  irreg-irreg; no murmurs Lungs:  Clear b/l, no wheezing, rhonchi or rales  Abd: soft, nontender Ext: no edema Musculoskeletal:  No deformities Skin: warm and dry  Neuro:   No gross focal abnormalities noted Psych:  Normal affect   EKG:  The EKG was personally reviewed and demonstrates:    Atypical flutter vs coarse Afib 115bpm, QS V1, no ischemic looking changes appreciated  Telemetry:  Telemetry was personally reviewed and demonstrates:   AFib 100's-120's  Relevant CV Studies:  Echo 03/2017 EF 10-15% Echo 07/2017 EF 25-30%  Echo 11/2017 EF 25-30% Echo 06/2018: EF ~30%    06/30/18: TTE Study  Conclusions - Left ventricle: The cavity size was at the upper limits of   normal. Systolic function was severely reduced. The estimated   ejection fraction was in the range of 25% to 30%. Diffuse   hypokinesis with regional variability. Doppler parameters are   consistent with restrictive physiology, indicative of decreased   left ventricular diastolic compliance and/or increased left   atrial pressure. - Aortic valve: Transvalvular velocity was within the normal range.   There was no stenosis. There was trivial regurgitation. - Mitral valve: Transvalvular velocity was within the normal range.   There was no evidence for stenosis. There was mild regurgitation. - Left atrium: The atrium was severely dilated. - Right ventricle: The cavity size was normal. Wall thickness was   normal. Systolic function was normal. - Right atrium: The atrium was mildly dilated. - Tricuspid valve: Transvalvular velocity was within the normal   range. There was mild-moderate regurgitation. - Pulmonic valve: There was trivial regurgitation. - Pulmonary arteries: Systolic pressure was mildly to moderately   increased. PA peak pressure: 37 mm Hg (S). - Inferior vena cava: The vessel was normal in size. The   respirophasic diameter changes were in the normal range (= 50%),   consistent with normal central venous pressure. - Pericardium, extracardiac: There was no pericardial effusion. Impressions: - Compared to the previous exam 12/15/2017, no significant change   has occured. Side by side comparison of images performed.   04/16/2019: [facility] Conclusions: 1. Mildly moderately elevated left heart filling pressures. 2. Mild pulmonary hypertension. 3. Normal right heart filling pressure. 4. Normal Fick cardiac output/index. 5. No angiographically significant coronary artery disease.  Recommendations: 1. Initiate metoprolol succinate 12.5 mg daily at bedtime. 2. If blood pressure and renal function tolerate,  consider adding low-dose ACE inhibitor or ARB tomorrow. 3. Consider restarting gradual  diuresis tomorrow, based on renal function. 4. Restart heparin infusion 2 hours after TR band deflation, given concern for apical thrombus  Laboratory Data:  High Sensitivity Troponin:  No results for input(s): TROPONINIHS in the last 720 hours.   Cardiac EnzymesNo results for input(s): TROPONINI in the last 168 hours. No results for input(s): TROPIPOC in the last 168 hours.  Chemistry Recent Labs  Lab 03/05/19 2017 03/06/19 0522  NA 139 139  K 3.2* 4.6  CL 103 107  CO2 25 22  GLUCOSE 121* 149*  BUN 12 13  CREATININE 0.70 0.74  CALCIUM 9.4 10.0  GFRNONAA >60 >60  GFRAA >60 >60  ANIONGAP 11 10    Recent Labs  Lab 03/05/19 2017 03/06/19 0522  PROT 6.7 7.0  ALBUMIN 3.8 4.0  AST 31 28  ALT 60* 59*  ALKPHOS 106 109  BILITOT 1.0 0.9   Hematology Recent Labs  Lab 03/05/19 2017 03/06/19 0522  WBC 9.4 7.9  RBC 4.60 4.89  HGB 14.5 15.5*  HCT 42.7 45.8  MCV 92.8 93.7  MCH 31.5 31.7  MCHC 34.0 33.8  RDW 12.6 12.6  PLT 316 342   BNPNo results for input(s): BNP, PROBNP in the last 168 hours.  DDimer No results for input(s): DDIMER in the last 168 hours.   Radiology/Studies:  [place] Ekg Site Rite  Result Date: 03/05/2019 If Site Rite image not attached, placement could not be confirmed due to current cardiac rhythm.   Assessment and Plan:   1. AFib     New diagnosis for the patient     Rate appears improved, though not controlled     CHA2DS2Vasc is one for CM, 2 with female gender, on Eliquis here  Dr. Curt Bears has seen and examined the patient, discussed case with Dr. Haroldine Laws Plan to treat hyperthyroidism and monitor rate response, no plans for TEE/DCCV just yet Rhythm looks an atypical AFlutter vs coarse AFib, would not be a straight forward fluuter ablation         For questions or updates, please contact [facility] Please consult [email or web address] for contact info  under     Signed, Baldwin Jamaica, PA-C  03/06/2019 2:13 PM  I have seen and examined this patient with Tommye Standard.  Agree with above, note added to reflect my findings.  On exam, irregular, tachycardic, lungs clear.  On exam of telemetry and ECGs, it looks like the patient is in atrial fibrillation and not atrial flutter.  If it is atrial flutter is certainly an atypical atrial flutter.  Unfortunately, she would not tolerate amiodarone as she is currently hyperthyroid which could be due to her amiodarone.  If there is trouble controlling her rhythm, amiodarone would be essentially her only option, although if she was able to afford it, dofetilide may be an option.  Would work for rate control with metoprolol.  Her heart rate is currently between 110 and 130.  Blood pressures in the 1 teens currently.  Could increase metoprolol dose as tolerated.  Hopefully treating her thyroid issues  help to control her heart rate.   M.  MD 03/06/2019 3:15 PM

## 2019-03-06 NOTE — Progress Notes (Addendum)
CVP 11 at 1500

## 2019-03-07 DIAGNOSIS — R945 Abnormal results of liver function studies: Secondary | ICD-10-CM | POA: Diagnosis present

## 2019-03-07 DIAGNOSIS — Z8249 Family history of ischemic heart disease and other diseases of the circulatory system: Secondary | ICD-10-CM | POA: Diagnosis not present

## 2019-03-07 DIAGNOSIS — I272 Pulmonary hypertension, unspecified: Secondary | ICD-10-CM | POA: Diagnosis present

## 2019-03-07 DIAGNOSIS — I959 Hypotension, unspecified: Secondary | ICD-10-CM | POA: Diagnosis present

## 2019-03-07 DIAGNOSIS — I509 Heart failure, unspecified: Secondary | ICD-10-CM | POA: Diagnosis present

## 2019-03-07 DIAGNOSIS — E0580 Other thyrotoxicosis without thyrotoxic crisis or storm: Secondary | ICD-10-CM | POA: Diagnosis present

## 2019-03-07 DIAGNOSIS — K219 Gastro-esophageal reflux disease without esophagitis: Secondary | ICD-10-CM | POA: Diagnosis present

## 2019-03-07 DIAGNOSIS — Z9114 Patient's other noncompliance with medication regimen: Secondary | ICD-10-CM | POA: Diagnosis not present

## 2019-03-07 DIAGNOSIS — I5023 Acute on chronic systolic (congestive) heart failure: Secondary | ICD-10-CM | POA: Diagnosis present

## 2019-03-07 DIAGNOSIS — Z853 Personal history of malignant neoplasm of breast: Secondary | ICD-10-CM | POA: Diagnosis not present

## 2019-03-07 DIAGNOSIS — E876 Hypokalemia: Secondary | ICD-10-CM | POA: Diagnosis present

## 2019-03-07 DIAGNOSIS — T462X5A Adverse effect of other antidysrhythmic drugs, initial encounter: Secondary | ICD-10-CM | POA: Diagnosis present

## 2019-03-07 DIAGNOSIS — I4892 Unspecified atrial flutter: Secondary | ICD-10-CM | POA: Diagnosis present

## 2019-03-07 DIAGNOSIS — I4891 Unspecified atrial fibrillation: Secondary | ICD-10-CM | POA: Diagnosis present

## 2019-03-07 DIAGNOSIS — I110 Hypertensive heart disease with heart failure: Secondary | ICD-10-CM | POA: Diagnosis present

## 2019-03-07 DIAGNOSIS — I493 Ventricular premature depolarization: Secondary | ICD-10-CM | POA: Diagnosis present

## 2019-03-07 DIAGNOSIS — I428 Other cardiomyopathies: Secondary | ICD-10-CM | POA: Diagnosis present

## 2019-03-07 DIAGNOSIS — Z20828 Contact with and (suspected) exposure to other viral communicable diseases: Secondary | ICD-10-CM | POA: Diagnosis present

## 2019-03-07 LAB — BASIC METABOLIC PANEL
Anion gap: 9 (ref 5–15)
BUN: 17 mg/dL (ref 6–20)
CO2: 27 mmol/L (ref 22–32)
Calcium: 9.4 mg/dL (ref 8.9–10.3)
Chloride: 103 mmol/L (ref 98–111)
Creatinine, Ser: 0.77 mg/dL (ref 0.44–1.00)
GFR calc Af Amer: >60 mL/min (ref >60)
GFR calc non Af Amer: >60 mL/min (ref >60)
Glucose, Bld: 112 mg/dL — ABNORMAL HIGH (ref 70–99)
Potassium: 3.5 mmol/L (ref 3.5–5.1)
Sodium: 139 mmol/L (ref 135–145)

## 2019-03-07 LAB — CBC
HCT: 45 % (ref 36.0–46.0)
Hemoglobin: 14.8 g/dL (ref 12.0–15.0)
MCH: 31.4 pg (ref 26.0–34.0)
MCHC: 32.9 g/dL (ref 30.0–36.0)
MCV: 95.3 fL (ref 80.0–100.0)
Platelets: 308 10*3/uL (ref 150–400)
RBC: 4.72 MIL/uL (ref 3.87–5.11)
RDW: 12.8 % (ref 11.5–15.5)
WBC: 9.7 10*3/uL (ref 4.0–10.5)
nRBC: 0 % (ref 0.0–0.2)

## 2019-03-07 LAB — COOXEMETRY PANEL
Carboxyhemoglobin: 1.5 % (ref 0.5–1.5)
Methemoglobin: 0.9 % (ref 0.0–1.5)
O2 Saturation: 67.4 %
Total hemoglobin: 11.4 g/dL — ABNORMAL LOW (ref 12.0–16.0)

## 2019-03-07 LAB — AMIODARONE LEVEL
Amiodarone Lvl: 0.4 ug/mL — ABNORMAL LOW (ref 1.0–2.5)
N-Desethyl-Amiodarone: 0.3 ug/mL — ABNORMAL LOW (ref 1.0–2.5)

## 2019-03-07 LAB — T3: T3, Total: 130 ng/dL (ref 71–180)

## 2019-03-07 MED ORDER — METOPROLOL TARTRATE 5 MG/5ML IV SOLN
2.5000 mg | Freq: Once | INTRAVENOUS | Status: AC
  Administered 2019-03-07: 16:00:00 2.5 mg via INTRAVENOUS

## 2019-03-07 MED ORDER — ALTEPLASE 2 MG IJ SOLR
2.0000 mg | Freq: Once | INTRAMUSCULAR | Status: AC
  Administered 2019-03-07: 06:00:00 2 mg
  Filled 2019-03-07: qty 2

## 2019-03-07 MED ORDER — METOPROLOL SUCCINATE ER 25 MG PO TB24
25.0000 mg | ORAL_TABLET | Freq: Two times a day (BID) | ORAL | Status: DC
  Administered 2019-03-07 – 2019-03-10 (×7): 25 mg via ORAL
  Filled 2019-03-07 (×7): qty 1

## 2019-03-07 MED ORDER — POTASSIUM CHLORIDE CRYS ER 20 MEQ PO TBCR
60.0000 meq | EXTENDED_RELEASE_TABLET | Freq: Once | ORAL | Status: AC
  Administered 2019-03-07: 60 meq via ORAL
  Filled 2019-03-07: qty 3

## 2019-03-07 MED ORDER — FUROSEMIDE 10 MG/ML IJ SOLN
40.0000 mg | Freq: Every day | INTRAMUSCULAR | Status: DC
  Administered 2019-03-07 – 2019-03-09 (×3): 40 mg via INTRAVENOUS
  Filled 2019-03-07 (×3): qty 4

## 2019-03-07 MED ORDER — ALTEPLASE 2 MG IJ SOLR
2.0000 mg | Freq: Once | INTRAMUSCULAR | Status: AC
  Administered 2019-03-07: 2 mg
  Filled 2019-03-07: qty 2

## 2019-03-07 NOTE — Progress Notes (Signed)
R PICC site assessed. tPA withdrawn from white and grey ports. Brisk blood return noted with arm extended. Sluggish when at pt's side.

## 2019-03-07 NOTE — Progress Notes (Signed)
   03/07/19 1100  Clinical Encounter Type  Visited With Patient  Visit Type Follow-up;Spiritual support  Referral From Nurse  Consult/Referral To Chaplain  Spiritual Encounters  Spiritual Needs Literature;Emotional  Stress Factors  Patient Stress Factors None identified   Responded to follow up about the AD. Pt was alert and talking. I offered pt a copy of the AD and and overview of how to fill it out. She stated she would talk it over her four sons because she cannot write. She stated that she feels better today. Also, she stated that other Spanish speakers were of great help with her stay here concerning her needs. I offered her spiritual care with words of comfort, ministry of presence and prayer. She was thankful for the [place] visit. Chaplain available as needed.  Chaplain Fidel Levy  [phone]

## 2019-03-07 NOTE — TOC Initial Note (Signed)
Transition of Care [facility]) - Initial/Assessment Note    Patient Details  Name: Megan Keith MRN: [id number] Date of Birth: [birth year]  Transition of Care [facility]) CM/SW Contact:    Bethena Roys, RN Phone Number: 03/07/2019, 3:49 PM  Clinical Narrative:  Pt presented for New onset Atrial Fib- Patient is without Insurance at this time-patient would have to pay out of pocket for medications. Patient will need all generics if possible. CM received consult for Eliquis- she would be able to utilize the 30 day free card, however after that will not be able to afford- unless she qualifies for the patient assistance via the company. Pt has a PCP Milana Obey- patient gets medications from [facility]. Patient states she works periodically. Financial counselor should be following the patient. CM will continue to monitor for additional transition of care needs.                  Expected Discharge Plan: Home/Self Care Barriers to Discharge: Continued Medical Work up   Patient Goals and CMS Choice     Choice offered to / list presented to : NA  Expected Discharge Plan and Services Expected Discharge Plan: Home/Self Care In-house Referral: Financial Counselor(should be following the patient based on inpatient status.) Discharge Planning Services: CM Consult, Medication Assistance Post Acute Care Choice: NA Living arrangements for the past 2 months: Single Family Home                                      Prior Living Arrangements/Services Living arrangements for the past 2 months: Single Family Home Lives with:: Adult Children Patient language and need for interpreter reviewed:: Yes([place] -used the video interpreter.) Do you feel safe going back to the place where you live?: Yes      Need for Family Participation in Patient Care: Yes (Comment) Care giver support system in place?: Yes (comment)   Criminal Activity/Legal Involvement Pertinent to Current  Situation/Hospitalization: No - Comment as needed  Activities of Daily Living Home Assistive Devices/Equipment: None ADL Screening (condition at time of admission) Patient's cognitive ability adequate to safely complete daily activities?: Yes Is the patient deaf or have difficulty hearing?: No Does the patient have difficulty seeing, even when wearing glasses/contacts?: No Does the patient have difficulty concentrating, remembering, or making decisions?: No Patient able to express need for assistance with ADLs?: Yes Does the patient have difficulty dressing or bathing?: No Independently performs ADLs?: Yes (appropriate for developmental age) Does the patient have difficulty walking or climbing stairs?: Yes Weakness of Legs: Both(weakness due to knee arthritis) Weakness of Arms/Hands: None  Permission Sought/Granted Permission sought to share information with : Family Supports                Emotional Assessment Appearance:: Appears stated age Attitude/Demeanor/Rapport: Engaged Affect (typically observed): Accepting Orientation: : Oriented to Self, Oriented to Place, Oriented to  Time, Oriented to Situation Alcohol / Substance Use: Not Applicable Psych Involvement: No (comment)  Admission diagnosis:  Heart failure, unspecified ([place]) [I50.9] Patient Active Problem List   Diagnosis Date Noted  . Atrial fibrillation with RVR ([place]) 03/05/2019  . Hypotension 03/05/2019  . Hyperthyroidism 03/05/2019  . GERD (gastroesophageal reflux disease) 02/05/2018  . Breast cancer ([place]) 02/05/2018  . Headache 02/05/2018  . Nausea & vomiting 02/05/2018  . Epigastric abdominal pain 02/05/2018  . Sore throat 02/05/2018  . Frequent PVCs   .  DCM (dilated cardiomyopathy) ([place])   . Acute on chronic HFrEF (heart failure with reduced ejection fraction) ([place]) 04/15/2017  . 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 [address] [place] [address] Phone: [phone] Fax: [phone]     Social Determinants of Health (SDOH) Interventions    Readmission Risk Interventions No flowsheet data found.

## 2019-03-07 NOTE — Progress Notes (Signed)
Advanced Heart Failure Rounding Note   Subjective:    Remains on prednisone and methimazole  for hyperthyroidism.  On digoxin and has gotten IV lopressor. Remains in AF with RVR. Rates overnight 100-120 but 120-150 during the day.   Feels weak. + palpitations and somewhat lightheaded. Co-ox 67% CVP 8-9   Objective:   Weight Range:  Vital Signs:   Temp:  [97.6 F (36.4 C)-99.5 F (37.5 C)] 99.5 F (37.5 C) (07/15 1139) Pulse Rate:  [72-115] 115 (07/15 1139) Resp:  [16-20] 16 (07/15 1139) BP: (97-128)/(53-92) 127/83 (07/15 1139) SpO2:  [98 %-100 %] 98 % (07/15 1139) Weight:  [65.8 kg] 65.8 kg (07/15 0618) Last BM Date: 03/06/19  Weight change: Filed Weights   03/05/19 1817 03/06/19 0534 03/07/19 0618  Weight: 66.5 kg 67 kg 65.8 kg    Intake/Output:   Intake/Output Summary (Last 24 hours) at 03/07/2019 1554 Last data filed at 03/07/2019 1352 Gross per 24 hour  Intake 1212.84 ml  Output 600 ml  Net 612.84 ml     Physical Exam: General:  Weak appearing. No resp difficulty HEENT: normal Neck: supple. JVP 8-9. Carotids 2+ bilat; no bruits. No lymphadenopathy or thryomegaly appreciated. Cor: PMI nondisplaced. Irreg tachy . No rubs, gallops or murmurs. Lungs: clear Abdomen: soft, nontender, nondistended. No hepatosplenomegaly. No bruits or masses. Good bowel sounds. Extremities: no cyanosis, clubbing, rash, tr edema Neuro: alert & orientedx3, cranial nerves grossly intact. moves all 4 extremities w/o difficulty. Affect pleasant   Telemetry: Afib 100-150s Personally reviewed   Labs: Basic Metabolic Panel: Recent Labs  Lab 03/05/19 2017 03/06/19 0522 03/07/19 0723  NA 139 139 139  K 3.2* 4.6 3.5  CL 103 107 103  CO2 25 22 27   GLUCOSE 121* 149* 112*  BUN 12 13 17   CREATININE 0.70 0.74 0.77  CALCIUM 9.4 10.0 9.4  MG 2.2 2.1  --     Liver Function Tests: Recent Labs  Lab 03/05/19 2017 03/06/19 0522  AST 31 28  ALT 60* 59*  ALKPHOS 106 109   BILITOT 1.0 0.9  PROT 6.7 7.0  ALBUMIN 3.8 4.0   No results for input(s): LIPASE, AMYLASE in the last 168 hours. No results for input(s): AMMONIA in the last 168 hours.  CBC: Recent Labs  Lab 03/05/19 2017 03/06/19 0522 03/07/19 0723  WBC 9.4 7.9 9.7  HGB 14.5 15.5* 14.8  HCT 42.7 45.8 45.0  MCV 92.8 93.7 95.3  PLT 316 342 308    Cardiac Enzymes: No results for input(s): CKTOTAL, CKMB, CKMBINDEX, TROPONINI in the last 168 hours.  BNP: BNP (last 3 results) No results for input(s): BNP in the last 8760 hours.  ProBNP (last 3 results) No results for input(s): PROBNP in the last 8760 hours.    Other results:  Imaging: Dg Chest Port 1 View  Result Date: 03/06/2019 CLINICAL DATA:  PICC line placement EXAM: PORTABLE CHEST 1 VIEW COMPARISON:  Chest x-rays dated 03/04/2019 and 03/08/2018. FINDINGS: RIGHT-sided PICC line is been placed with tip well positioned at the lower SVC/cavoatrial junction. Stable cardiomegaly. Lungs are clear. No pleural effusion or pneumothorax seen. Osseous structures about the chest are unremarkable. IMPRESSION: 1. Right-sided PICC line in place with tip well positioned at the lower SVC/cavoatrial junction. 2. Stable cardiomegaly. 3. No pneumothorax seen. Electronically Signed   By: Franki Cabot M.D.   On: 03/06/2019 14:17   US Thyroid  Result Date: 03/07/2019 CLINICAL DATA:  Hyperthyroidism EXAM: THYROID ULTRASOUND TECHNIQUE: Ultrasound examination of the  thyroid gland and adjacent soft tissues was performed. COMPARISON:  None. FINDINGS: Parenchymal Echotexture: Normal Isthmus: 0.5 cm thickness Right lobe: 3.6 x 1.7 x 1.7 cm Left lobe: 4.3 x 1.7 x 1.7 cm _________________________________________________________ Estimated total number of nodules >/= 1 cm: 0 Number of spongiform nodules >/=  2 cm not described below (TR1): 0 Number of mixed cystic and solid nodules >/= 1.5 cm not described below (TR2): 0  _________________________________________________________ 0.8 cm hypoechoic nodule without calcifications, inferior pole right lobe; This nodule does NOT meet TI-RADS criteria for biopsy or dedicated follow-up. IMPRESSION: 1. Subcentimeter right thyroid nodule which does not meet criteria for biopsy or follow-up. Otherwise negative. The above is in keeping with the ACR TI-RADS recommendations - J Am Coll Radiol 2017;14:587-595. Electronically Signed   By: Lucrezia Europe M.D.   On: 03/07/2019 07:22   [place] Ekg Site Rite  Result Date: 03/05/2019 If Site Rite image not attached, placement could not be confirmed due to current cardiac rhythm.    Medications:     Scheduled Medications: . apixaban  5 mg Oral BID  . digoxin  0.25 mg Oral Daily  . furosemide  40 mg Intravenous Daily  . methimazole  40 mg Oral Daily  . metoprolol succinate  25 mg Oral BID  . predniSONE  40 mg Oral Q breakfast  . sodium chloride flush  10-40 mL Intracatheter Q12H  . sodium chloride flush  3 mL Intravenous Q12H    Infusions: . sodium chloride 250 mL (03/06/19 1212)    PRN Medications: sodium chloride, lip balm, metoprolol tartrate, sodium chloride flush   Assessment/Plan:   1. New onset atrial fib with RVR in setting of hyperthyroidism - she remains very tenuous with AF with RVR - strips reviewed with EP. 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 Tikosyn not an option with amio on boards - will continue digoxin and increase po lopressor - will plan TEE/DC-CV once she has had at least 3 doses of Eliquis and thyroid has been treated for several days as she will be unlikely to hold sinus if thyroid still activated. Plan Friday  2. Acute on chronic systolic HF insetting of #1 - EF  20-25% NICM (felt to be PVC mediated) - volume status not to bad. CVP 8-9 - Co-ox ok. Give one dose IV lasix today - BP too soft for ACE/ARB - hold lasix for now  3. Hyperthyroidism - discussion as above - I d/w Endo - Continue prednisone 40 and methimazole 40 daily for now. 20mg  daily for each on d/c -  Thyroid u/s - small nodule. Otherwise unremarkable  4. Hypokalemia - supp - add spiro as tolerated.   4. H/o frequent PVCs   D/w Dr. Cathlean Sauer at bedside.   Length of Stay: 2   Glori Bickers MD 03/07/2019, 3:54 PM  Advanced Heart Failure Team Pager [phone] (M-F; 7a - 4p)  Please contact [facility] for night-coverage after hours (4p -7a ) and weekends on [email or web address]

## 2019-03-07 NOTE — Progress Notes (Signed)
PROGRESS NOTE    Megan Keith  ZOX:[id number] DOB: 11-06-1960 DOA: 03/05/2019 PCP: Aldona Bar, MD    Brief Narrative:  58 year old female who presented with palpitations.  She is known to have the significant past medical history for nonischemic cardiomyopathy, ejection fraction 25 to 30%, hypertension, GERD, and left breast cancer status post lumpectomy and adjuvant radiation/hormonal therapy.  She reported weight gain, orthopnea and lower extremity edema.  On her initial physical examination blood pressure was 90/64, 108/84, heart rate 116-120-150, respirate 18, temperature 98.9, oxygen saturation 98%.  She had moist mucous membranes, her lungs were clear to auscultation bilaterally, heart S1-S2 present irregularly irregular 120-150, her abdomen was soft, trace lower extremity edema.  Sodium 139, potassium 3.2, chloride 103, bicarb 25, glucose 121, BUN 12, creatinine 0.7, recent 2.2, white count 9.4, hemoglobin 14.5, hematocrit 42.7, platelets 316, TSH 0.011, free T4 2.4, SARS COVID-19 was negative.  EKG had 115 bpm, atrial flutter with variable block, no ST segment or T wave changes.  Her chest x-ray had vascular congestion, cardiomegaly, CT chest negative for pulmonary embolism, positive pulmonary edema.  Patient was admitted to the hospital working diagnosis acute on chronic systolic heart failure exacerbation complicated by atrial fibrillation with rapid ventricular response.   Assessment & Plan:   Principal Problem:   Atrial fibrillation with RVR (HCC) Active Problems:   Acute on chronic HFrEF (heart failure with reduced ejection fraction) (HCC)   Hypotension   Hyperthyroidism   1.  Atrial fibrillation with rapid ventricular response. Continue uncontrolled atrial fibrillation, has tolerated well IV metoprolol. Case discussed with Dr. Haroldine Laws, will start patient on metoprolol, continue digoxin, plan for cardioversion on 03/09/19. Continue telemetry monitoring at the  progressive care unit. Anticoagulation with apixaban.   2. Acute on chronic systolic heart failure decompensation/ LV ejection fraction is 25 to 30%. Improved volume status. Patient had diuresis on admission, her urine output over last 24 H is 2,100. Will continue b blockade. Blood pressure 811 systolic.   3. Hyperthyroidism. Continue to hold on amiodarone, will continue methimazole and prednisne for now and will check TSH in am. Possible amiodarone induced.   4. Hypokalemia. K this am at 3,5 with serum cr at 0,77, serum bicarbonate at 27. Will continue close monitoring of renal function and electrolytes.     DVT prophylaxis: apixaban   Code Status: full Family Communication: no family at the bedside  Disposition Plan/ discharge barriers: plan for cardioversion on 03/09/19.   Body mass index is 24.9 kg/m. Malnutrition Type:      Malnutrition Characteristics:      Nutrition Interventions:     RN Pressure Injury Documentation:     Consultants:   Cardiology   EP  Procedures:     Antimicrobials:       Subjective: Patient continue to be symptomatic, tachycardic, no nausea or vomiting, but positive dizziness, HR 150's  Objective: Vitals:   03/07/19 0006 03/07/19 0618 03/07/19 0809 03/07/19 1139  BP: 97/68 (!) 103/53 110/71 127/83  Pulse: (!) 107 (!) 110 72 (!) 115  Resp: 20 20 16 16   Temp: 97.6 F (36.4 C) 98 F (36.7 C) 97.8 F (36.6 C) 99.5 F (37.5 C)  TempSrc: Oral Oral Oral Oral  SpO2: 100% 100% 98% 98%  Weight:  65.8 kg      Intake/Output Summary (Last 24 hours) at 03/07/2019 1336 Last data filed at 03/07/2019 0904 Gross per 24 hour  Intake 732.84 ml  Output 600 ml  Net 132.84 ml  Filed Weights   03/05/19 1817 03/06/19 0534 03/07/19 0618  Weight: 66.5 kg 67 kg 65.8 kg    Examination:   General: deconditioned and ill looking appearing  Neurology: Awake and alert, non focal  E ENT: mild pallor, no icterus, oral mucosa moist  Cardiovascular: No JVD. S1-S2 present, irregularly irregular, no gallops, rubs, or murmurs. No lower extremity edema. Pulmonary: positive breath sounds bilaterally, adequate air movement, no wheezing, rhonchi or rales. Gastrointestinal. Abdomen with no organomegaly, non tender, no rebound or guarding Skin. No rashes Musculoskeletal: no joint deformities     Data Reviewed: I have personally reviewed following labs and imaging studies  CBC: Recent Labs  Lab 03/05/19 2017 03/06/19 0522 03/07/19 0723  WBC 9.4 7.9 9.7  HGB 14.5 15.5* 14.8  HCT 42.7 45.8 45.0  MCV 92.8 93.7 95.3  PLT 316 342 888   Basic Metabolic Panel: Recent Labs  Lab 03/05/19 2017 03/06/19 0522 03/07/19 0723  NA 139 139 139  K 3.2* 4.6 3.5  CL 103 107 103  CO2 25 22 27   GLUCOSE 121* 149* 112*  BUN 12 13 17   CREATININE 0.70 0.74 0.77  CALCIUM 9.4 10.0 9.4  MG 2.2 2.1  --    GFR: CrCl cannot be calculated (Unknown ideal weight.). Liver Function Tests: Recent Labs  Lab 03/05/19 2017 03/06/19 0522  AST 31 28  ALT 60* 59*  ALKPHOS 106 109  BILITOT 1.0 0.9  PROT 6.7 7.0  ALBUMIN 3.8 4.0   No results for input(s): LIPASE, AMYLASE in the last 168 hours. No results for input(s): AMMONIA in the last 168 hours. Coagulation Profile: No results for input(s): INR, PROTIME in the last 168 hours. Cardiac Enzymes: No results for input(s): CKTOTAL, CKMB, CKMBINDEX, TROPONINI in the last 168 hours. BNP (last 3 results) No results for input(s): PROBNP in the last 8760 hours. HbA1C: Recent Labs    03/06/19 0522  HGBA1C 5.6   CBG: No results for input(s): GLUCAP in the last 168 hours. Lipid Profile: No results for input(s): CHOL, HDL, LDLCALC, TRIG, CHOLHDL, LDLDIRECT in the last 72 hours. Thyroid Function Tests: Recent Labs    03/05/19 2017  TSH 0.011*  FREET4 2.43*   Anemia Panel: No results for input(s): VITAMINB12, FOLATE, FERRITIN, TIBC, IRON, RETICCTPCT in the last 72 hours.     Radiology Studies: I have reviewed all of the imaging during this hospital visit personally     Scheduled Meds: . apixaban  5 mg Oral BID  . digoxin  0.25 mg Oral Daily  . methimazole  40 mg Oral Daily  . predniSONE  40 mg Oral Q breakfast  . sodium chloride flush  10-40 mL Intracatheter Q12H  . sodium chloride flush  3 mL Intravenous Q12H   Continuous Infusions: . sodium chloride 250 mL (03/06/19 1212)     LOS: 2 days        Stephaniemarie Stoffel Gerome Apley, MD

## 2019-03-07 NOTE — Progress Notes (Signed)
Telemetry reviewed, AFib 90's-110's generally, once NSVT (6 beats) No new recommendations from EP at this juncture, plan for TEE/DCCV when felt timing is right from hyperthyroid/CHF perspective, will defer to medicine/CHF team. BP looks OK  EP will follow from afar, remain available.  Tommye Standard, PA-C

## 2019-03-08 DIAGNOSIS — E0590 Thyrotoxicosis, unspecified without thyrotoxic crisis or storm: Secondary | ICD-10-CM

## 2019-03-08 LAB — CBC
HCT: 45.6 % (ref 36.0–46.0)
Hemoglobin: 15.2 g/dL — ABNORMAL HIGH (ref 12.0–15.0)
MCH: 31.3 pg (ref 26.0–34.0)
MCHC: 33.3 g/dL (ref 30.0–36.0)
MCV: 93.8 fL (ref 80.0–100.0)
Platelets: 321 10*3/uL (ref 150–400)
RBC: 4.86 MIL/uL (ref 3.87–5.11)
RDW: 12.6 % (ref 11.5–15.5)
WBC: 12.3 10*3/uL — ABNORMAL HIGH (ref 4.0–10.5)
nRBC: 0 % (ref 0.0–0.2)

## 2019-03-08 LAB — BASIC METABOLIC PANEL
Anion gap: 10 (ref 5–15)
BUN: 18 mg/dL (ref 6–20)
CO2: 25 mmol/L (ref 22–32)
Calcium: 9.7 mg/dL (ref 8.9–10.3)
Chloride: 104 mmol/L (ref 98–111)
Creatinine, Ser: 0.81 mg/dL (ref 0.44–1.00)
GFR calc Af Amer: >60 mL/min (ref >60)
GFR calc non Af Amer: >60 mL/min (ref >60)
Glucose, Bld: 105 mg/dL — ABNORMAL HIGH (ref 70–99)
Potassium: 3.5 mmol/L (ref 3.5–5.1)
Sodium: 139 mmol/L (ref 135–145)

## 2019-03-08 LAB — COOXEMETRY PANEL
Carboxyhemoglobin: 0.9 % (ref 0.5–1.5)
Methemoglobin: 0.6 % (ref 0.0–1.5)
O2 Saturation: 63.4 %
Total hemoglobin: 15.8 g/dL (ref 12.0–16.0)

## 2019-03-08 LAB — TSH: TSH: 0.016 u[IU]/mL — ABNORMAL LOW (ref 0.350–4.500)

## 2019-03-08 LAB — MAGNESIUM: Magnesium: 2.3 mg/dL (ref 1.7–2.4)

## 2019-03-08 MED ORDER — POTASSIUM CHLORIDE CRYS ER 20 MEQ PO TBCR
60.0000 meq | EXTENDED_RELEASE_TABLET | Freq: Once | ORAL | Status: AC
  Administered 2019-03-08: 60 meq via ORAL
  Filled 2019-03-08: qty 3

## 2019-03-08 MED ORDER — ANASTROZOLE 1 MG PO TABS
1.0000 mg | ORAL_TABLET | Freq: Every day | ORAL | Status: DC
  Administered 2019-03-08 – 2019-03-10 (×3): 1 mg via ORAL
  Filled 2019-03-08 (×3): qty 1

## 2019-03-08 MED ORDER — SPIRONOLACTONE 25 MG PO TABS
25.0000 mg | ORAL_TABLET | Freq: Every day | ORAL | Status: DC
  Administered 2019-03-08 – 2019-03-10 (×3): 25 mg via ORAL
  Filled 2019-03-08 (×3): qty 1

## 2019-03-08 NOTE — Progress Notes (Addendum)
PROGRESS NOTE    Megan Keith  UQJ:[id number] DOB: [birth year] DOA: 03/05/2019 PCP: Aldona Bar, MD    Brief Narrative:  58 year old female who presented with palpitations. She is known to have the significant past medical history for nonischemic cardiomyopathy, ejection fraction 25 to 30%, hypertension, GERD, and left breast cancer status post lumpectomy and adjuvant radiation/hormonal therapy. She reported weight gain, orthopnea and lower extremity edema. On her initial physical examination blood pressure was 90/64, 108/84, heart rate 116-120-150, respirate 18, temperature 98.9, oxygen saturation 98%. She had moist mucous membranes,her lungs were clear to auscultation bilaterally, heart S1-S2 present irregularly irregular120-150,her abdomen was soft, trace lower extremity edema. Sodium 139, potassium 3.2, chloride 103, bicarb 25, glucose 121, BUN 12, creatinine 0.7, recent 2.2, white count 9.4, hemoglobin 14.5, hematocrit 42.7, platelets 316, TSH 0.011, free T4 2.4,SARS COVID-19 was negative. EKG had 115bpm, atrial flutter with variable block,no ST segment or T wave changes. Her chest x-ray had vascular congestion, cardiomegaly, CT chest negative for pulmonary embolism, positive pulmonary edema.  Patient was admitted to the hospital working diagnosis acute on chronic systolic heart failure exacerbation complicated by atrial fibrillation with rapid ventricular response.   Assessment & Plan:   Principal Problem:   Atrial fibrillation with RVR (HCC) Active Problems:   Acute on chronic HFrEF (heart failure with reduced ejection fraction) (HCC)   Hypotension   Hyperthyroidism   1.Atrial fibrillation with rapid ventricular response. Persistent trial fibrillation, continue rate control with carvedilol, and digoxin, anticoagulation with apixaban. Plan for TEE and cardioversion in am. Continue blood pressure monitoring.    2. Acute on chronic systolic heart failure  decompensation/ LV ejection fraction is 25 to 30%.  Tolerating well b blockade, will continue blood pressure monitoring. New acute on chronic decompensation now added spironolactone and started diuresis with furosemide.   3. Hyperthyroidism. Continue methimazole, will dc steroids, TSH continue to be very low. Follow with thyroid US.   4. Hypokalemia. Continue K correction with Kcl, will follow with renal panel in am.    DVT prophylaxis:apixaban Code Status:full Family Communication:no family at the bedside Disposition Plan/ discharge barriers:plan for cardioversion on 03/09/19  Body mass index is 24.7 kg/m. Malnutrition Type:      Malnutrition Characteristics:      Nutrition Interventions:     RN Pressure Injury Documentation:     Consultants:   Cardiology   Procedures:     Antimicrobials:       Subjective: Patient continue to have symptoms of dizziness and dyspnea, no chest pain, no nausea or vomiting, no tremors but occasional diaphoresis.   Objective: Vitals:   03/08/19 0523 03/08/19 0525 03/08/19 0827 03/08/19 1104  BP: 114/67  131/67   Pulse: 73  84 (!) 110  Resp:      Temp: 98.5 F (36.9 C)     TempSrc: Oral     SpO2:  100%    Weight:  65.3 kg      Intake/Output Summary (Last 24 hours) at 03/08/2019 1405 Last data filed at 03/08/2019 0526 Gross per 24 hour  Intake 240 ml  Output 1000 ml  Net -760 ml   Filed Weights   03/06/19 0534 03/07/19 0618 03/08/19 0525  Weight: 67 kg 65.8 kg 65.3 kg    Examination:   General: deconditioned  Neurology: Awake and alert, non focal  E ENT: mild pallor, no icterus, oral mucosa moist Cardiovascular: No JVD. S1-S2 present, irregularly irregular, with no gallops, rubs, or murmurs. No lower extremity edema. Pulmonary:  Positive breath sounds bilaterally, adequate air movement, no wheezing, rhonchi or rales. Gastrointestinal. Abdomen with no organomegaly, non tender, no rebound or guarding  Skin. No rashes Musculoskeletal: no joint deformities     Data Reviewed: I have personally reviewed following labs and imaging studies  CBC: Recent Labs  Lab 03/05/19 2017 03/06/19 0522 03/07/19 0723 03/08/19 0554  WBC 9.4 7.9 9.7 12.3*  HGB 14.5 15.5* 14.8 15.2*  HCT 42.7 45.8 45.0 45.6  MCV 92.8 93.7 95.3 93.8  PLT 316 342 308 676   Basic Metabolic Panel: Recent Labs  Lab 03/05/19 2017 03/06/19 0522 03/07/19 0723 03/08/19 0554  NA 139 139 139 139  K 3.2* 4.6 3.5 3.5  CL 103 107 103 104  CO2 25 22 27 25   GLUCOSE 121* 149* 112* 105*  BUN 12 13 17 18   CREATININE 0.70 0.74 0.77 0.81  CALCIUM 9.4 10.0 9.4 9.7  MG 2.2 2.1  --  2.3   GFR: CrCl cannot be calculated (Unknown ideal weight.). Liver Function Tests: Recent Labs  Lab 03/05/19 2017 03/06/19 0522  AST 31 28  ALT 60* 59*  ALKPHOS 106 109  BILITOT 1.0 0.9  PROT 6.7 7.0  ALBUMIN 3.8 4.0   No results for input(s): LIPASE, AMYLASE in the last 168 hours. No results for input(s): AMMONIA in the last 168 hours. Coagulation Profile: No results for input(s): INR, PROTIME in the last 168 hours. Cardiac Enzymes: No results for input(s): CKTOTAL, CKMB, CKMBINDEX, TROPONINI in the last 168 hours. BNP (last 3 results) No results for input(s): PROBNP in the last 8760 hours. HbA1C: Recent Labs    03/06/19 0522  HGBA1C 5.6   CBG: No results for input(s): GLUCAP in the last 168 hours. Lipid Profile: No results for input(s): CHOL, HDL, LDLCALC, TRIG, CHOLHDL, LDLDIRECT in the last 72 hours. Thyroid Function Tests: Recent Labs    03/05/19 2017 03/08/19 0555  TSH 0.011* 0.016*  FREET4 2.43*  --    Anemia Panel: No results for input(s): VITAMINB12, FOLATE, FERRITIN, TIBC, IRON, RETICCTPCT in the last 72 hours.    Radiology Studies: I have reviewed all of the imaging during this hospital visit personally     Scheduled Meds: . anastrozole  1 mg Oral Daily  . apixaban  5 mg Oral BID  . digoxin   0.25 mg Oral Daily  . furosemide  40 mg Intravenous Daily  . methimazole  40 mg Oral Daily  . metoprolol succinate  25 mg Oral BID  . predniSONE  40 mg Oral Q breakfast  . sodium chloride flush  10-40 mL Intracatheter Q12H  . sodium chloride flush  3 mL Intravenous Q12H  . spironolactone  25 mg Oral Daily   Continuous Infusions: . sodium chloride 250 mL (03/06/19 1212)     LOS: 3 days        Avah Bashor Gerome Apley, MD

## 2019-03-08 NOTE — Progress Notes (Signed)
Writer @ bedside to assess red lumen of RUE TL PICC per consult. White and grey lumens TPA'd 7/15. All lumens of TLC flushed without difficulty, brisk blood return noted. Primary care RN @ bedside to visualize blood return., Instructed to consult VAST if difficulty flushing TL PICC occurs.

## 2019-03-08 NOTE — TOC Progression Note (Signed)
Transition of Care [facility]) - Progression Note    Patient Details  Name: Megan Keith MRN: [id number] Date of Birth: 1961/07/24  Transition of Care [facility] CM/SW Contact  Graves-Bigelow, Ocie Cornfield, RN Phone Number: 03/08/2019, 12:48 PM  Clinical Narrative: CM did reach out to [facility] in [place] to schedule an appointment with Aldona Bar, However patient will need to fill out new application for sliding scale fee & take them the 3 most recent pay stubs. Once the office looks over everything the patient may be eligible for sliding scale fee cost for appointments. After approval office will then be able to schedule new appointment. [facility] has a CSW to assist with patient assistance applications for medications. No onsite pharmacy. Patient may benefit from [facility] once she is medically stable and if the patient is d/c via the week days she can utilize the [place].      Expected Discharge Plan: Home/Self Care Barriers to Discharge: Continued Medical Work up  Expected Discharge Plan and Services Expected Discharge Plan: Home/Self Care In-house Referral: Financial Counselor(should be following the patient based on inpatient status.) Discharge Planning Services: CM Consult, Medication Assistance Post Acute Care Choice: NA Living arrangements for the past 2 months: Single Family Home                                       Social Determinants of Health (SDOH) Interventions    Readmission Risk Interventions No flowsheet data found.

## 2019-03-08 NOTE — Progress Notes (Signed)
Advanced Heart Failure Rounding Note   Subjective:    Remains on prednisone and methimazole  for hyperthyroidism.  Remains in AF. On digoxin and Toprol 25 bid. Rate control improved. Resting HRs 75-105. Up to 120 with mild activity.   Diuresing with IV lasix. Weight down 2 pounds. Breathing better. Denies orthopnea or PND. Palpitations improving.   CVP 5-6 Co-ox 63%   Objective:   Weight Range:  Vital Signs:   Temp:  [98.1 F (36.7 C)-99.5 F (37.5 C)] 98.5 F (36.9 C) (07/16 0523) Pulse Rate:  [73-137] 84 (07/16 0827) Resp:  [16-20] 20 (07/15 1447) BP: (112-131)/(67-83) 131/67 (07/16 0827) SpO2:  [98 %-100 %] 100 % (07/16 0525) Weight:  [65.3 kg] 65.3 kg (07/16 0525) Last BM Date: 03/06/19  Weight change: Filed Weights   03/06/19 0534 03/07/19 0618 03/08/19 0525  Weight: 67 kg 65.8 kg 65.3 kg    Intake/Output:   Intake/Output Summary (Last 24 hours) at 03/08/2019 1053 Last data filed at 03/08/2019 0526 Gross per 24 hour  Intake 720 ml  Output 1000 ml  Net -280 ml     Physical Exam: General:  Well appearing. No resp difficulty HEENT: normal Neck: supple. JVP 5-6. Carotids 2+ bilat; no bruits. No lymphadenopathy or thryomegaly appreciated. Cor: PMI nondisplaced. Irregular. No rubs, gallops or murmurs. Lungs: clear Abdomen: soft, nontender, nondistended. No hepatosplenomegaly. No bruits or masses. Good bowel sounds. Extremities: no cyanosis, clubbing, rash, edema Neuro: alert & orientedx3, cranial nerves grossly intact. moves all 4 extremities w/o difficulty. Affect pleasant   Telemetry: Afib 75-120s Personally reviewed   Labs: Basic Metabolic Panel: Recent Labs  Lab 03/05/19 2017 03/06/19 0522 03/07/19 0723 03/08/19 0554  NA 139 139 139 139  K 3.2* 4.6 3.5 3.5  CL 103 107 103 104  CO2 25 22 27 25   GLUCOSE 121* 149* 112* 105*  BUN 12 13 17 18   CREATININE 0.70 0.74 0.77 0.81  CALCIUM 9.4 10.0 9.4 9.7  MG 2.2 2.1  --  2.3    Liver Function  Tests: Recent Labs  Lab 03/05/19 2017 03/06/19 0522  AST 31 28  ALT 60* 59*  ALKPHOS 106 109  BILITOT 1.0 0.9  PROT 6.7 7.0  ALBUMIN 3.8 4.0   No results for input(s): LIPASE, AMYLASE in the last 168 hours. No results for input(s): AMMONIA in the last 168 hours.  CBC: Recent Labs  Lab 03/05/19 2017 03/06/19 0522 03/07/19 0723 03/08/19 0554  WBC 9.4 7.9 9.7 12.3*  HGB 14.5 15.5* 14.8 15.2*  HCT 42.7 45.8 45.0 45.6  MCV 92.8 93.7 95.3 93.8  PLT 316 342 308 321    Cardiac Enzymes: No results for input(s): CKTOTAL, CKMB, CKMBINDEX, TROPONINI in the last 168 hours.  BNP: BNP (last 3 results) No results for input(s): BNP in the last 8760 hours.  ProBNP (last 3 results) No results for input(s): PROBNP in the last 8760 hours.    Other results:  Imaging: Dg Chest Port 1 View  Result Date: 03/06/2019 CLINICAL DATA:  PICC line placement EXAM: PORTABLE CHEST 1 VIEW COMPARISON:  Chest x-rays dated 03/04/2019 and 03/08/2018. FINDINGS: RIGHT-sided PICC line is been placed with tip well positioned at the lower SVC/cavoatrial junction. Stable cardiomegaly. Lungs are clear. No pleural effusion or pneumothorax seen. Osseous structures about the chest are unremarkable. IMPRESSION: 1. Right-sided PICC line in place with tip well positioned at the lower SVC/cavoatrial junction. 2. Stable cardiomegaly. 3. No pneumothorax seen. Electronically Signed   By: Cherlynn Kaiser  Enriqueta Shutter M.D.   On: 03/06/2019 14:17   US Thyroid  Result Date: 03/07/2019 CLINICAL DATA:  Hyperthyroidism EXAM: THYROID ULTRASOUND TECHNIQUE: Ultrasound examination of the thyroid gland and adjacent soft tissues was performed. COMPARISON:  None. FINDINGS: Parenchymal Echotexture: Normal Isthmus: 0.5 cm thickness Right lobe: 3.6 x 1.7 x 1.7 cm Left lobe: 4.3 x 1.7 x 1.7 cm _________________________________________________________ Estimated total number of nodules >/= 1 cm: 0 Number of spongiform nodules >/=  2 cm not described below  (TR1): 0 Number of mixed cystic and solid nodules >/= 1.5 cm not described below (TR2): 0 _________________________________________________________ 0.8 cm hypoechoic nodule without calcifications, inferior pole right lobe; This nodule does NOT meet TI-RADS criteria for biopsy or dedicated follow-up. IMPRESSION: 1. Subcentimeter right thyroid nodule which does not meet criteria for biopsy or follow-up. Otherwise negative. The above is in keeping with the ACR TI-RADS recommendations - J Am Coll Radiol 2017;14:587-595. Electronically Signed   By: Lucrezia Europe M.D.   On: 03/07/2019 07:22     Medications:     Scheduled Medications: . anastrozole  1 mg Oral Daily  . apixaban  5 mg Oral BID  . digoxin  0.25 mg Oral Daily  . furosemide  40 mg Intravenous Daily  . methimazole  40 mg Oral Daily  . metoprolol succinate  25 mg Oral BID  . potassium chloride  60 mEq Oral Once  . predniSONE  40 mg Oral Q breakfast  . sodium chloride flush  10-40 mL Intracatheter Q12H  . sodium chloride flush  3 mL Intravenous Q12H  . spironolactone  25 mg Oral Daily    Infusions: . sodium chloride 250 mL (03/06/19 1212)    PRN Medications: sodium chloride, lip balm, metoprolol tartrate, sodium chloride flush   Assessment/Plan:   1. New onset atrial fib with RVR in setting of hyperthyroidism - she remains tenuous with AF with RVR - strips reviewed with EP. 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  - rate controlled improved on dig and toprol 25 bid. Will continue. Plan TE/DC-CV tomorrow - if fails DC-CV can consider Tikosyn if needed as amio level 0.4   2. 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 CVP 5-6.  Continue lasix 40 IV daily for now - add spiro  - Co-ox ok.   3. Hyperthyroidism - discussion as above - I d/w Endo - Continue prednisone 40 and methimazole 40 daily for now. 20mg  daily for each on d/c -  Thyroid u/s - small nodule. Otherwise unremarkable  4. Hypokalemia - supp - add spiro as tolerated.   4. H/o frequent PVCs    Length of Stay: 3   Glori Bickers MD 03/08/2019, 10:53 AM  Advanced Heart Failure Team Pager [phone] (M-F; 7a - 4p)  Please contact [facility] for night-coverage after hours (4p -7a ) and weekends on [email or web address]

## 2019-03-09 ENCOUNTER — Inpatient Hospital Stay (HOSPITAL_COMMUNITY): Payer: Medicaid Other | Admitting: Certified Registered Nurse Anesthetist

## 2019-03-09 ENCOUNTER — Encounter (HOSPITAL_COMMUNITY)
Admission: AD | Disposition: A | Discharge status: Home / Self Care | Payer: Self-pay | Source: Other Acute Inpatient Hospital | Attending: Internal Medicine

## 2019-03-09 ENCOUNTER — Encounter (HOSPITAL_COMMUNITY): Payer: Self-pay | Admitting: Certified Registered Nurse Anesthetist

## 2019-03-09 ENCOUNTER — Other Ambulatory Visit: Payer: Self-pay

## 2019-03-09 ENCOUNTER — Inpatient Hospital Stay (HOSPITAL_COMMUNITY): Payer: Medicaid Other

## 2019-03-09 DIAGNOSIS — I4891 Unspecified atrial fibrillation: Secondary | ICD-10-CM

## 2019-03-09 HISTORY — PX: CARDIOVERSION: SHX1299

## 2019-03-09 HISTORY — PX: TEE WITHOUT CARDIOVERSION: SHX5443

## 2019-03-09 LAB — BASIC METABOLIC PANEL
Anion gap: 11 (ref 5–15)
BUN: 19 mg/dL (ref 6–20)
CO2: 23 mmol/L (ref 22–32)
Calcium: 9.6 mg/dL (ref 8.9–10.3)
Chloride: 103 mmol/L (ref 98–111)
Creatinine, Ser: 0.7 mg/dL (ref 0.44–1.00)
GFR calc Af Amer: >60 mL/min (ref >60)
GFR calc non Af Amer: >60 mL/min (ref >60)
Glucose, Bld: 107 mg/dL — ABNORMAL HIGH (ref 70–99)
Potassium: 3.9 mmol/L (ref 3.5–5.1)
Sodium: 137 mmol/L (ref 135–145)

## 2019-03-09 LAB — COOXEMETRY PANEL
Carboxyhemoglobin: 1.4 % (ref 0.5–1.5)
Methemoglobin: 0.9 % (ref 0.0–1.5)
O2 Saturation: 64.3 %
Total hemoglobin: 15 g/dL (ref 12.0–16.0)

## 2019-03-09 LAB — MAGNESIUM: Magnesium: 2.1 mg/dL (ref 1.7–2.4)

## 2019-03-09 SURGERY — ECHOCARDIOGRAM, TRANSESOPHAGEAL
Anesthesia: General

## 2019-03-09 MED ORDER — SODIUM CHLORIDE 0.9% FLUSH
3.0000 mL | Freq: Two times a day (BID) | INTRAVENOUS | Status: DC
  Administered 2019-03-09 – 2019-03-10 (×2): 3 mL via INTRAVENOUS

## 2019-03-09 MED ORDER — PROPOFOL 500 MG/50ML IV EMUL
INTRAVENOUS | Status: DC | PRN
  Administered 2019-03-09: 75 ug/kg/min via INTRAVENOUS

## 2019-03-09 MED ORDER — SODIUM CHLORIDE 0.9% FLUSH: 3.0000 mL | INTRAVENOUS | Status: DC | PRN

## 2019-03-09 MED ORDER — METHIMAZOLE 10 MG PO TABS: 20.0000 mg | ORAL_TABLET | Freq: Every day | ORAL | 11 refills | Status: DC

## 2019-03-09 MED ORDER — APIXABAN 5 MG PO TABS: 5.0000 mg | ORAL_TABLET | Freq: Two times a day (BID) | ORAL | 11 refills | Status: DC

## 2019-03-09 MED ORDER — PROPOFOL 10 MG/ML IV BOLUS
INTRAVENOUS | Status: DC | PRN
  Administered 2019-03-09 (×2): 20 mg via INTRAVENOUS
  Administered 2019-03-09: 25 mg via INTRAVENOUS

## 2019-03-09 MED ORDER — SODIUM CHLORIDE 0.9 % IV SOLN
INTRAVENOUS | Status: DC | PRN
  Administered 2019-03-09: 14:00:00 via INTRAVENOUS

## 2019-03-09 MED ORDER — PREDNISONE 20 MG PO TABS
20.0000 mg | ORAL_TABLET | Freq: Every day | ORAL | Status: DC
  Administered 2019-03-10: 10:00:00 20 mg via ORAL
  Filled 2019-03-09: qty 1

## 2019-03-09 MED ORDER — SODIUM CHLORIDE 0.9 % IV BOLUS
250.0000 mL | Freq: Once | INTRAVENOUS | Status: AC
  Administered 2019-03-09: 250 mL via INTRAVENOUS

## 2019-03-09 MED ORDER — SODIUM CHLORIDE 0.9 % IV SOLN: INTRAVENOUS | Status: DC

## 2019-03-09 MED ORDER — METOPROLOL SUCCINATE ER 25 MG PO TB24: 25.0000 mg | ORAL_TABLET | Freq: Two times a day (BID) | ORAL | 11 refills | Status: DC

## 2019-03-09 MED ORDER — DIGOXIN 250 MCG PO TABS: 0.2500 mg | ORAL_TABLET | Freq: Every day | ORAL | 11 refills | Status: DC

## 2019-03-09 MED ORDER — PREDNISONE 20 MG PO TABS: 20.0000 mg | ORAL_TABLET | Freq: Every day | ORAL | 11 refills | Status: DC

## 2019-03-09 MED ORDER — METHIMAZOLE 10 MG PO TABS
20.0000 mg | ORAL_TABLET | Freq: Every day | ORAL | Status: DC
  Administered 2019-03-10: 10:00:00 20 mg via ORAL
  Filled 2019-03-09: qty 2

## 2019-03-09 MED ORDER — SODIUM CHLORIDE 0.9 % IV SOLN: 250.0000 mL | INTRAVENOUS | Status: DC

## 2019-03-09 MED FILL — DIGOXIN 250 MCG TAB: 250 | 30 days supply | Qty: 30 | Fill #0

## 2019-03-09 MED FILL — METOPROLOL SUCCINATE ER 25: 25 | 30 days supply | Qty: 60 | Fill #0

## 2019-03-09 MED FILL — ELIQUIS 5 MG TABLET: 5 | 30 days supply | Qty: 60 | Fill #0

## 2019-03-09 MED FILL — predniSONE 20 MG TABS: 20 | 30 days supply | Qty: 30 | Fill #0

## 2019-03-09 MED FILL — methIMAzole 10 MG TABS: 10 | 15 days supply | Qty: 30 | Fill #0

## 2019-03-09 NOTE — TOC Progression Note (Addendum)
Transition of Care [facility]) - Progression Note    Patient Details  Name: Megan Keith MRN: [id number] Date of Birth: 1961/03/08  [facility] CM/SW Contact  Graves-Bigelow, Ocie Cornfield, RN Phone Number: 03/09/2019, 11:24 AM  Clinical Narrative: CM did make patient aware via stratus video interpreter of hospital follow up appointment and what she needs to do with application for sliding scale fee and the patient assistance application for Eliquis. CM completed [place] program for patient. Medications should be delivered from [place] on 03-09-19. Patient may not be medically stale for transition home until Sunday. Weekend Case Manager to continue to follow.     Expected Discharge Plan: Home/Self Care Barriers to Discharge: Continued Medical Work up  Expected Discharge Plan and Services Expected Discharge Plan: Home/Self Care In-house Referral: Financial Counselor(should be following the patient based on inpatient status.) Discharge Planning Services: CM Consult, Medication Assistance Post Acute Care Choice: NA Living arrangements for the past 2 months: Single Family Home                                       Social Determinants of Health (SDOH) Interventions    Readmission Risk Interventions No flowsheet data found.

## 2019-03-09 NOTE — Progress Notes (Signed)
Advanced Heart Failure Rounding Note   Subjective:     Remains in AF. On digoxin and Toprol 25 bid. Rate control much improved. Resting HRs 60-80s. Up to 105 with mild activity.   Feeling much better. Denies orthopnea or PND.   CVP 3-4. Co-ox 64%   Objective:   Weight Range:  Vital Signs:   Temp:  [98.1 F (36.7 C)-98.9 F (37.2 C)] 98.9 F (37.2 C) (07/17 0457) Pulse Rate:  [78-84] 78 (07/17 0457) Resp:  [18] 18 (07/17 0457) BP: (100-135)/(64-107) 100/64 (07/17 0457) SpO2:  [96 %] 96 % (07/17 0457) Weight:  [65.1 kg] 65.1 kg (07/17 0457) Last BM Date: 03/06/19  Weight change: Filed Weights   03/07/19 0618 03/08/19 0525 03/09/19 0457  Weight: 65.8 kg 65.3 kg 65.1 kg    Intake/Output:   Intake/Output Summary (Last 24 hours) at 03/09/2019 1142 Last data filed at 03/09/2019 0900 Gross per 24 hour  Intake 300 ml  Output 300 ml  Net 0 ml     Physical Exam: General:  Well appearing. No resp difficulty HEENT: normal Neck: supple. no JVD. Carotids 2+ bilat; no bruits. No lymphadenopathy or thryomegaly appreciated. Cor: PMI nondisplaced. IRR. IRR  No rubs, gallops or murmurs. Lungs: clear Abdomen: soft, nontender, nondistended. No hepatosplenomegaly. No bruits or masses. Good bowel sounds. Extremities: no cyanosis, clubbing, rash, edema Neuro: alert & orientedx3, cranial nerves grossly intact. moves all 4 extremities w/o difficulty. Affect pleasant   Telemetry: AFHRs 60-80s. Up to 105 with mild activity. Personally reviewed   Labs: Basic Metabolic Panel: Recent Labs  Lab 03/05/19 2017 03/06/19 0522 03/07/19 0723 03/08/19 0554 03/09/19 0911  NA 139 139 139 139 137  K 3.2* 4.6 3.5 3.5 3.9  CL 103 107 103 104 103  CO2 25 22 27 25 23   GLUCOSE 121* 149* 112* 105* 107*  BUN 12 13 17 18 19   CREATININE 0.70 0.74 0.77 0.81 0.70  CALCIUM 9.4 10.0 9.4 9.7 9.6  MG 2.2 2.1  --  2.3 2.1    Liver Function Tests: Recent Labs  Lab 03/05/19 2017 03/06/19  0522  AST 31 28  ALT 60* 59*  ALKPHOS 106 109  BILITOT 1.0 0.9  PROT 6.7 7.0  ALBUMIN 3.8 4.0   No results for input(s): LIPASE, AMYLASE in the last 168 hours. No results for input(s): AMMONIA in the last 168 hours.  CBC: Recent Labs  Lab 03/05/19 2017 03/06/19 0522 03/07/19 0723 03/08/19 0554  WBC 9.4 7.9 9.7 12.3*  HGB 14.5 15.5* 14.8 15.2*  HCT 42.7 45.8 45.0 45.6  MCV 92.8 93.7 95.3 93.8  PLT 316 342 308 321    Cardiac Enzymes: No results for input(s): CKTOTAL, CKMB, CKMBINDEX, TROPONINI in the last 168 hours.  BNP: BNP (last 3 results) No results for input(s): BNP in the last 8760 hours.  ProBNP (last 3 results) No results for input(s): PROBNP in the last 8760 hours.    Other results:  Imaging: No results found.   Medications:     Scheduled Medications: . anastrozole  1 mg Oral Daily  . apixaban  5 mg Oral BID  . digoxin  0.25 mg Oral Daily  . [START ON 03/10/2019] methimazole  20 mg Oral Daily  . metoprolol succinate  25 mg Oral BID  . [START ON 03/10/2019] predniSONE  20 mg Oral Q breakfast  . sodium chloride flush  10-40 mL Intracatheter Q12H  . sodium chloride flush  3 mL Intravenous Q12H  . spironolactone  25 mg Oral Daily    Infusions: . sodium chloride 250 mL (03/06/19 1212)  . sodium chloride      PRN Medications: sodium chloride, lip balm, metoprolol tartrate, sodium chloride flush   Assessment/Plan:   1. 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 Plan TEE/DC-CV today  2. 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 CVP low 3-4. Stop lasix. Give 250cc NS prior to DC-CV - continue spiro  - Co-ox ok.   3. 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 Please do  not stop. Will restart at 20 daily each.  - She will need outpatient f/u with endo -  Thyroid u/s - small nodule. Otherwise unremarkable  4. Hypokalemia - supp - add spiro as tolerated.   4. H/o frequent PVCs    Length of Stay: 4   Glori Bickers MD 03/09/2019, 11:42 AM  Advanced Heart Failure Team Pager [phone] (M-F; 7a - 4p)  Please contact [facility] for night-coverage after hours (4p -7a ) and weekends on [email or web address]

## 2019-03-09 NOTE — Progress Notes (Addendum)
PROGRESS NOTE    Megan Keith  VXB:[id number] DOB: 12-Jun-1961 DOA: 03/05/2019 PCP: Aldona Bar, MD    Brief Narrative:  58 year old female who presented with palpitations. She is known to have the significant past medical history for nonischemic cardiomyopathy, ejection fraction 25 to 30%, hypertension, GERD, and left breast cancer status post lumpectomy and adjuvant radiation/hormonal therapy. She reported weight gain, orthopnea and lower extremity edema. On her initial physical examination blood pressure was 90/64, 108/84, heart rate 116-120-150, respirate 18, temperature 98.9, oxygen saturation 98%. She had moist mucous membranes,her lungs were clear to auscultation bilaterally, heart S1-S2 present irregularly irregular120-150,her abdomen was soft, trace lower extremity edema. Sodium 139, potassium 3.2, chloride 103, bicarb 25, glucose 121, BUN 12, creatinine 0.7, recent 2.2, white count 9.4, hemoglobin 14.5, hematocrit 42.7, platelets 316, TSH 0.011, free T4 2.4,SARS COVID-19 was negative. EKG had 115bpm, atrial flutter with variable block,no ST segment or T wave changes. Her chest x-ray had vascular congestion, cardiomegaly, CT chest negative for pulmonary embolism, positive pulmonary edema.  Patient was admitted to the hospital working diagnosis acute on chronic systolic heart failure exacerbation complicated by atrial fibrillation with rapid ventricular response.   Assessment & Plan:   Principal Problem:   Atrial fibrillation with RVR (HCC) Active Problems:   Acute on chronic HFrEF (heart failure with reduced ejection fraction) (HCC)   Hypotension   Hyperthyroidism   1.Atrial fibrillation with rapid ventricular response.patient is sp successful cardioversion, now on sinus rhythm, will continue rate control with metoprolol and anticoagulation with apixaban. Continue digoxin.   2. Acute on chronic systolic heart failure decompensation/ LV ejection  fraction is 25 to 30%.  Clinically euvolemic, will continue heart failure management with metoprolol and spironolactone. Diuresis with furosemide.   3. Hyperthyroidism. Amiodarone induced.No mayor symptoms, will continue mehtimazole for now, will need outpatient follow up. [place] with a sub-centimter nodule, not candidate for biopsy. Continue prednisone.  4. Hypokalemia.Serum K at 3,9 with preserved renal function, will follow on renal panel in am. Avoid hypotension and hypotension.   DVT prophylaxis:apixaban Code Status:full Family Communication:no family at the bedside Disposition Plan/ discharge barriers:plan for cardioversion on 03/09/19  Body mass index is 24.55 kg/m. Malnutrition Type:      Malnutrition Characteristics:      Nutrition Interventions:     RN Pressure Injury Documentation:     Consultants:     Procedures:     Antimicrobials:       Subjective: Patient is feeling better, no nausea or vomiting, no palpitations or dyspnea, no chest pain.   Objective: Vitals:   03/09/19 0457 03/09/19 1313 03/09/19 1320 03/09/19 1324  BP: 100/64 102/73 115/75   Pulse: 78 92 90   Resp: 18 17 15    Temp: 98.9 F (37.2 C) 98.3 F (36.8 C)    TempSrc: Oral Oral    SpO2: 96% 100% 100%   Weight: 65.1 kg   64.9 kg    Intake/Output Summary (Last 24 hours) at 03/09/2019 1408 Last data filed at 03/09/2019 0900 Gross per 24 hour  Intake 300 ml  Output 300 ml  Net 0 ml   Filed Weights   03/08/19 0525 03/09/19 0457 03/09/19 1324  Weight: 65.3 kg 65.1 kg 64.9 kg    Examination:   General: deconditioned Neurology: Awake and alert, non focal  E ENT: no pallor, no icterus, oral mucosa moist Cardiovascular: No JVD. S1-S2 present, rhythmic, no gallops, rubs, or murmurs. No lower extremity edema. Pulmonary: positive breath sounds bilaterally, adequate air movement,  no wheezing, rhonchi or rales. Gastrointestinal. Abdomen flat, no organomegaly, non  tender, no rebound or guarding Skin. No rashes Musculoskeletal: no joint deformities     Data Reviewed: I have personally reviewed following labs and imaging studies  CBC: Recent Labs  Lab 03/05/19 2017 03/06/19 0522 03/07/19 0723 03/08/19 0554  WBC 9.4 7.9 9.7 12.3*  HGB 14.5 15.5* 14.8 15.2*  HCT 42.7 45.8 45.0 45.6  MCV 92.8 93.7 95.3 93.8  PLT 316 342 308 354   Basic Metabolic Panel: Recent Labs  Lab 03/05/19 2017 03/06/19 0522 03/07/19 0723 03/08/19 0554 03/09/19 0911  NA 139 139 139 139 137  K 3.2* 4.6 3.5 3.5 3.9  CL 103 107 103 104 103  CO2 25 22 27 25 23   GLUCOSE 121* 149* 112* 105* 107*  BUN 12 13 17 18 19   CREATININE 0.70 0.74 0.77 0.81 0.70  CALCIUM 9.4 10.0 9.4 9.7 9.6  MG 2.2 2.1  --  2.3 2.1   GFR: CrCl cannot be calculated (Unknown ideal weight.). Liver Function Tests: Recent Labs  Lab 03/05/19 2017 03/06/19 0522  AST 31 28  ALT 60* 59*  ALKPHOS 106 109  BILITOT 1.0 0.9  PROT 6.7 7.0  ALBUMIN 3.8 4.0   No results for input(s): LIPASE, AMYLASE in the last 168 hours. No results for input(s): AMMONIA in the last 168 hours. Coagulation Profile: No results for input(s): INR, PROTIME in the last 168 hours. Cardiac Enzymes: No results for input(s): CKTOTAL, CKMB, CKMBINDEX, TROPONINI in the last 168 hours. BNP (last 3 results) No results for input(s): PROBNP in the last 8760 hours. HbA1C: No results for input(s): HGBA1C in the last 72 hours. CBG: No results for input(s): GLUCAP in the last 168 hours. Lipid Profile: No results for input(s): CHOL, HDL, LDLCALC, TRIG, CHOLHDL, LDLDIRECT in the last 72 hours. Thyroid Function Tests: Recent Labs    03/08/19 0555  TSH 0.016*   Anemia Panel: No results for input(s): VITAMINB12, FOLATE, FERRITIN, TIBC, IRON, RETICCTPCT in the last 72 hours.    Radiology Studies: I have reviewed all of the imaging during this hospital visit personally     Scheduled Meds: . [MAR Hold] anastrozole  1  mg Oral Daily  . [MAR Hold] apixaban  5 mg Oral BID  . [MAR Hold] digoxin  0.25 mg Oral Daily  . [MAR Hold] methimazole  20 mg Oral Daily  . [MAR Hold] metoprolol succinate  25 mg Oral BID  . [MAR Hold] predniSONE  20 mg Oral Q breakfast  . [MAR Hold] sodium chloride flush  10-40 mL Intracatheter Q12H  . [MAR Hold] sodium chloride flush  3 mL Intravenous Q12H  . sodium chloride flush  3 mL Intravenous Q12H  . [MAR Hold] spironolactone  25 mg Oral Daily   Continuous Infusions: . [MAR Hold] sodium chloride 250 mL (03/06/19 1212)  . sodium chloride    . sodium chloride       LOS: 4 days         Gerome Apley, MD

## 2019-03-09 NOTE — Transfer of Care (Signed)
Immediate Anesthesia Transfer of Care Note  Patient: Megan Keith  Procedure(s) Performed: TRANSESOPHAGEAL ECHOCARDIOGRAM (TEE) (N/A ) CARDIOVERSION (N/A )  Patient Location: Endoscopy Unit  Anesthesia Type:MAC  Level of Consciousness: drowsy  Airway & Oxygen Therapy: Patient Spontanous Breathing and Patient connected to nasal cannula oxygen  Post-op Assessment: Report given to RN and Post -op Vital signs reviewed and stable  Post vital signs: Reviewed and stable  Last Vitals:  Vitals Value Taken Time  BP    Temp    Pulse    Resp    SpO2      Last Pain:  Vitals:   03/09/19 1313  TempSrc: Oral  PainSc:       Patients Stated Pain Goal: 0 (42/10/31 2811)  Complications: No apparent anesthesia complications

## 2019-03-09 NOTE — Anesthesia Procedure Notes (Signed)
Procedure Name: MAC Date/Time: 03/09/2019 2:25 PM Performed by: Colin Benton, CRNA Pre-anesthesia Checklist: Patient identified, Emergency Drugs available, Suction available and Patient being monitored Patient Re-evaluated:Patient Re-evaluated prior to induction Oxygen Delivery Method: Nasal cannula Induction Type: IV induction Placement Confirmation: positive ETCO2 Dental Injury: Teeth and Oropharynx as per pre-operative assessment

## 2019-03-09 NOTE — Anesthesia Postprocedure Evaluation (Signed)
Anesthesia Post Note  Patient: Megan Keith  Procedure(s) Performed: TRANSESOPHAGEAL ECHOCARDIOGRAM (TEE) (N/A ) CARDIOVERSION (N/A )     Patient location during evaluation: Endoscopy Anesthesia Type: General Level of consciousness: awake and alert Pain management: pain level controlled Vital Signs Assessment: post-procedure vital signs reviewed and stable Respiratory status: spontaneous breathing, nonlabored ventilation and respiratory function stable Cardiovascular status: blood pressure returned to baseline and stable Postop Assessment: no apparent nausea or vomiting Anesthetic complications: no    Last Vitals:  Vitals:   03/09/19 1500 03/09/19 1508  BP: 90/63 (!) 88/61  Pulse: 73 68  Resp: 17 19  Temp:    SpO2: 100% 99%    Last Pain:  Vitals:   03/09/19 1449  TempSrc: Temporal  PainSc: 0-No pain                 Kiasia Chou,W. EDMOND

## 2019-03-09 NOTE — H&P (View-Only) (Signed)
Advanced Heart Failure Rounding Note   Subjective:     Remains in AF. On digoxin and Toprol 25 bid. Rate control much improved. Resting HRs 60-80s. Up to 105 with mild activity.   Feeling much better. Denies orthopnea or PND.   CVP 3-4. Co-ox 64%   Objective:   Weight Range:  Vital Signs:   Temp:  [98.1 F (36.7 C)-98.9 F (37.2 C)] 98.9 F (37.2 C) (07/17 0457) Pulse Rate:  [78-84] 78 (07/17 0457) Resp:  [18] 18 (07/17 0457) BP: (100-135)/(64-107) 100/64 (07/17 0457) SpO2:  [96 %] 96 % (07/17 0457) Weight:  [65.1 kg] 65.1 kg (07/17 0457) Last BM Date: 03/06/19  Weight change: Filed Weights   03/07/19 0618 03/08/19 0525 03/09/19 0457  Weight: 65.8 kg 65.3 kg 65.1 kg    Intake/Output:   Intake/Output Summary (Last 24 hours) at 03/09/2019 1142 Last data filed at 03/09/2019 0900 Gross per 24 hour  Intake 300 ml  Output 300 ml  Net 0 ml     Physical Exam: General:  Well appearing. No resp difficulty HEENT: normal Neck: supple. no JVD. Carotids 2+ bilat; no bruits. No lymphadenopathy or thryomegaly appreciated. Cor: PMI nondisplaced. IRR. IRR  No rubs, gallops or murmurs. Lungs: clear Abdomen: soft, nontender, nondistended. No hepatosplenomegaly. No bruits or masses. Good bowel sounds. Extremities: no cyanosis, clubbing, rash, edema Neuro: alert & orientedx3, cranial nerves grossly intact. moves all 4 extremities w/o difficulty. Affect pleasant   Telemetry: AFHRs 60-80s. Up to 105 with mild activity. Personally reviewed   Labs: Basic Metabolic Panel: Recent Labs  Lab 03/05/19 2017 03/06/19 0522 03/07/19 0723 03/08/19 0554 03/09/19 0911  NA 139 139 139 139 137  K 3.2* 4.6 3.5 3.5 3.9  CL 103 107 103 104 103  CO2 25 22 27 25 23   GLUCOSE 121* 149* 112* 105* 107*  BUN 12 13 17 18 19   CREATININE 0.70 0.74 0.77 0.81 0.70  CALCIUM 9.4 10.0 9.4 9.7 9.6  MG 2.2 2.1  --  2.3 2.1    Liver Function Tests: Recent Labs  Lab 03/05/19 2017 03/06/19  0522  AST 31 28  ALT 60* 59*  ALKPHOS 106 109  BILITOT 1.0 0.9  PROT 6.7 7.0  ALBUMIN 3.8 4.0   No results for input(s): LIPASE, AMYLASE in the last 168 hours. No results for input(s): AMMONIA in the last 168 hours.  CBC: Recent Labs  Lab 03/05/19 2017 03/06/19 0522 03/07/19 0723 03/08/19 0554  WBC 9.4 7.9 9.7 12.3*  HGB 14.5 15.5* 14.8 15.2*  HCT 42.7 45.8 45.0 45.6  MCV 92.8 93.7 95.3 93.8  PLT 316 342 308 321    Cardiac Enzymes: No results for input(s): CKTOTAL, CKMB, CKMBINDEX, TROPONINI in the last 168 hours.  BNP: BNP (last 3 results) No results for input(s): BNP in the last 8760 hours.  ProBNP (last 3 results) No results for input(s): PROBNP in the last 8760 hours.    Other results:  Imaging: No results found.   Medications:     Scheduled Medications: . anastrozole  1 mg Oral Daily  . apixaban  5 mg Oral BID  . digoxin  0.25 mg Oral Daily  . [START ON 03/10/2019] methimazole  20 mg Oral Daily  . metoprolol succinate  25 mg Oral BID  . [START ON 03/10/2019] predniSONE  20 mg Oral Q breakfast  . sodium chloride flush  10-40 mL Intracatheter Q12H  . sodium chloride flush  3 mL Intravenous Q12H  . spironolactone  25 mg Oral Daily    Infusions: . sodium chloride 250 mL (03/06/19 1212)  . sodium chloride      PRN Medications: sodium chloride, lip balm, metoprolol tartrate, sodium chloride flush   Assessment/Plan:   1. 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 Plan TEE/DC-CV today  2. 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 CVP low 3-4. Stop lasix. Give 250cc NS prior to DC-CV - continue spiro  - Co-ox ok.   3. 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 Please do  not stop. Will restart at 20 daily each.  - She will need outpatient f/u with endo -  Thyroid u/s - small nodule. Otherwise unremarkable  4. Hypokalemia - supp - add spiro as tolerated.   4. H/o frequent PVCs    Length of Stay: 4   Glori Bickers MD 03/09/2019, 11:42 AM  Advanced Heart Failure Team Pager [phone] (M-F; 7a - 4p)  Please contact [facility] for night-coverage after hours (4p -7a ) and weekends on [email or web address]

## 2019-03-09 NOTE — Progress Notes (Signed)
  Echocardiogram Echocardiogram Transesophageal has been performed.  Jannett Celestine 03/09/2019, 3:10 PM

## 2019-03-09 NOTE — Interval H&P Note (Signed)
History and Physical Interval Note:  03/09/2019 2:26 PM  Megan Keith  has presented today for surgery, with the diagnosis of AFIB.  The various methods of treatment have been discussed with the patient and family. After consideration of risks, benefits and other options for treatment, the patient has consented to  Procedure(s): TRANSESOPHAGEAL ECHOCARDIOGRAM (TEE) (N/A) CARDIOVERSION (N/A) as a surgical intervention.  The patient's history has been reviewed, patient examined, no change in status, stable for surgery.  I have reviewed the patient's chart and labs.  Questions were answered to the patient's satisfaction.     Daniel Bensimhon

## 2019-03-09 NOTE — Anesthesia Preprocedure Evaluation (Addendum)
Anesthesia Evaluation  Patient identified by MRN, date of birth, ID band Patient awake    Reviewed: Allergy & Precautions, NPO status , Patient's Chart, lab work & pertinent test results  Airway Mallampati: I  TM Distance: >3 FB Neck ROM: Full    Dental no notable dental hx. (+) Partial Upper, Dental Advisory Given   Pulmonary neg pulmonary ROS,    Pulmonary exam normal breath sounds clear to auscultation       Cardiovascular +CHF  Normal cardiovascular exam+ dysrhythmias (new onset afib with RVR 2/2 amio hyperthyroid now on metoprolol and digoxin) Atrial Fibrillation  Rhythm:Regular Rate:Normal  TTE 2018 EF 25-30%, mild MR, mild to mod TR,   Cath 2018 1.Mildly moderately elevated left heart filling pressures. 2.Mild pulmonary hypertension. 3.Normal right heart filling pressure. 4.Normal Fick cardiac output/index. 5.No angiographically significant coronary artery disease.   Neuro/Psych negative neurological ROS  negative psych ROS   GI/Hepatic Neg liver ROS, GERD  ,  Endo/Other  Hyperthyroidism   Renal/GU negative Renal ROS  negative genitourinary   Musculoskeletal  (+) Arthritis ,   Abdominal   Peds  Hematology   Anesthesia Other Findings   Reproductive/Obstetrics                           Anesthesia Physical Anesthesia Plan  ASA: III  Anesthesia Plan: General   Post-op Pain Management:    Induction: Intravenous  PONV Risk Score and Plan: 3 and Propofol infusion and Treatment may vary due to age or medical condition  Airway Management Planned: Natural Airway and Nasal Cannula  Additional Equipment:   Intra-op Plan:   Post-operative Plan: Extubation in OR  Informed Consent: I have reviewed the patients History and Physical, chart, labs and discussed the procedure including the risks, benefits and alternatives for the proposed anesthesia with the patient or authorized  representative who has indicated his/her understanding and acceptance.     Dental advisory given  Plan Discussed with: CRNA  Anesthesia Plan Comments:        Anesthesia Quick Evaluation
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
## 2019-03-10 DIAGNOSIS — I9589 Other hypotension: Secondary | ICD-10-CM

## 2019-03-10 DIAGNOSIS — E861 Hypovolemia: Secondary | ICD-10-CM

## 2019-03-10 LAB — HEPATIC FUNCTION PANEL
ALT: 20 U/L (ref 0–44)
AST: 15 U/L (ref 15–41)
Albumin: 3.6 g/dL (ref 3.5–5.0)
Alkaline Phosphatase: 103 U/L (ref 38–126)
Bilirubin, Direct: 0.2 mg/dL (ref 0.0–0.2)
Indirect Bilirubin: 0.7 mg/dL (ref 0.3–0.9)
Total Bilirubin: 0.9 mg/dL (ref 0.3–1.2)
Total Protein: 6.5 g/dL (ref 6.5–8.1)

## 2019-03-10 LAB — CBC
HCT: 43.1 % (ref 36.0–46.0)
Hemoglobin: 14.6 g/dL (ref 12.0–15.0)
MCH: 31.7 pg (ref 26.0–34.0)
MCHC: 33.9 g/dL (ref 30.0–36.0)
MCV: 93.5 fL (ref 80.0–100.0)
Platelets: 295 10*3/uL (ref 150–400)
RBC: 4.61 MIL/uL (ref 3.87–5.11)
RDW: 12.4 % (ref 11.5–15.5)
WBC: 8.8 10*3/uL (ref 4.0–10.5)
nRBC: 0 % (ref 0.0–0.2)

## 2019-03-10 LAB — BASIC METABOLIC PANEL
Anion gap: 9 (ref 5–15)
BUN: 20 mg/dL (ref 6–20)
CO2: 25 mmol/L (ref 22–32)
Calcium: 9.4 mg/dL (ref 8.9–10.3)
Chloride: 102 mmol/L (ref 98–111)
Creatinine, Ser: 0.71 mg/dL (ref 0.44–1.00)
GFR calc Af Amer: >60 mL/min (ref >60)
GFR calc non Af Amer: >60 mL/min (ref >60)
Glucose, Bld: 95 mg/dL (ref 70–99)
Potassium: 3.6 mmol/L (ref 3.5–5.1)
Sodium: 136 mmol/L (ref 135–145)

## 2019-03-10 LAB — COOXEMETRY PANEL
Carboxyhemoglobin: 1.1 % (ref 0.5–1.5)
Methemoglobin: 0.6 % (ref 0.0–1.5)
O2 Saturation: 63.7 %
Total hemoglobin: 14.9 g/dL (ref 12.0–16.0)

## 2019-03-10 MED ORDER — FUROSEMIDE 40 MG PO TABS: ORAL_TABLET | ORAL | 11 refills | Status: DC

## 2019-03-10 MED ORDER — PANTOPRAZOLE SODIUM 40 MG PO TBEC: 40.0000 mg | DELAYED_RELEASE_TABLET | Freq: Every day | ORAL | 0 refills | Status: DC

## 2019-03-10 MED ORDER — LOSARTAN POTASSIUM 25 MG PO TABS: 12.5000 mg | ORAL_TABLET | Freq: Every day | ORAL | 0 refills | Status: DC

## 2019-03-10 MED ORDER — SPIRONOLACTONE 25 MG PO TABS: 25.0000 mg | ORAL_TABLET | Freq: Every day | ORAL | 0 refills | Status: DC

## 2019-03-10 MED ORDER — DIGOXIN 125 MCG PO TABS: 125.0000 ug | ORAL_TABLET | Freq: Every day | ORAL | 0 refills | Status: DC

## 2019-03-10 NOTE — Care Management (Signed)
Patient's medications were filled and delivered by [facility] pharmacy yesterday through [facility].  Prescriptions added today.  Patient in [facility] system but unsure if MATCH will cover new scripts.  Patient given GoodRX prescriptions to cover new scripts if MATCH ($3 copay) doesn't work.

## 2019-03-10 NOTE — Progress Notes (Signed)
Advanced Heart Failure Rounding Note   Subjective:     Underwent DC-CV yesterday. Remains in NSR.  LVEF 10% by TEE yesterday  BP soft overnight so losartan held.  No bleeding on Eliquis.  Primary team planning d/c today   Objective:   Weight Range:  Vital Signs:   Temp:  [97.3 F (36.3 C)-98.5 F (36.9 C)] 97.7 F (36.5 C) (07/18 0433) Pulse Rate:  [60-93] 93 (07/18 1206) Resp:  [14-21] 21 (07/18 1206) BP: (85-115)/(56-68) 105/56 (07/18 1206) SpO2:  [99 %-100 %] 99 % (07/18 1206) Weight:  [64.9 kg] 64.9 kg (07/18 0433) Last BM Date: 03/06/19  Weight change: Filed Weights   03/09/19 0457 03/09/19 1324 03/10/19 0433  Weight: 65.1 kg 64.9 kg 64.9 kg    Intake/Output:   Intake/Output Summary (Last 24 hours) at 03/10/2019 1414 Last data filed at 03/09/2019 1900 Gross per 24 hour  Intake 340 ml  Output 1000 ml  Net -660 ml     Physical Exam: General:  Well appearing. No resp difficulty HEENT: normal Neck: supple. no JVD. Carotids 2+ bilat; no bruits. No lymphadenopathy or thryomegaly appreciated. Cor: PMI nondisplaced. Regular rate & rhythm. No rubs, gallops or murmurs. Lungs: clear Abdomen: soft, nontender, nondistended. No hepatosplenomegaly. No bruits or masses. Good bowel sounds. Extremities: no cyanosis, clubbing, rash, edema Neuro: alert & orientedx3, cranial nerves grossly intact. moves all 4 extremities w/o difficulty. Affect pleasant    Telemetry: AFHRs 60-80s. Up to 105 with mild activity. Personally reviewed   Labs: Basic Metabolic Panel: Recent Labs  Lab 03/05/19 2017 03/06/19 0522 03/07/19 0723 03/08/19 0554 03/09/19 0911 03/10/19 0329  NA 139 139 139 139 137 136  K 3.2* 4.6 3.5 3.5 3.9 3.6  CL 103 107 103 104 103 102  CO2 25 22 27 25 23 25   GLUCOSE 121* 149* 112* 105* 107* 95  BUN 12 13 17 18 19 20   CREATININE 0.70 0.74 0.77 0.81 0.70 0.71  CALCIUM 9.4 10.0 9.4 9.7 9.6 9.4  MG 2.2 2.1  --  2.3 2.1  --     Liver Function  Tests: Recent Labs  Lab 03/05/19 2017 03/06/19 0522 03/10/19 0329  AST 31 28 15   ALT 60* 59* 20  ALKPHOS 106 109 103  BILITOT 1.0 0.9 0.9  PROT 6.7 7.0 6.5  ALBUMIN 3.8 4.0 3.6   No results for input(s): LIPASE, AMYLASE in the last 168 hours. No results for input(s): AMMONIA in the last 168 hours.  CBC: Recent Labs  Lab 03/05/19 2017 03/06/19 0522 03/07/19 0723 03/08/19 0554 03/10/19 0329  WBC 9.4 7.9 9.7 12.3* 8.8  HGB 14.5 15.5* 14.8 15.2* 14.6  HCT 42.7 45.8 45.0 45.6 43.1  MCV 92.8 93.7 95.3 93.8 93.5  PLT 316 342 308 321 295    Cardiac Enzymes: No results for input(s): CKTOTAL, CKMB, CKMBINDEX, TROPONINI in the last 168 hours.  BNP: BNP (last 3 results) No results for input(s): BNP in the last 8760 hours.  ProBNP (last 3 results) No results for input(s): PROBNP in the last 8760 hours.    Other results:  Imaging: No results found.   Medications:     Scheduled Medications: . anastrozole  1 mg Oral Daily  . apixaban  5 mg Oral BID  . digoxin  0.25 mg Oral Daily  . methimazole  20 mg Oral Daily  . metoprolol succinate  25 mg Oral BID  . predniSONE  20 mg Oral Q breakfast  . sodium chloride flush  10-40 mL Intracatheter Q12H  . sodium chloride flush  3 mL Intravenous Q12H  . sodium chloride flush  3 mL Intravenous Q12H  . spironolactone  25 mg Oral Daily    Infusions: . sodium chloride 250 mL (03/06/19 1212)  . sodium chloride      PRN Medications: sodium chloride, lip balm, metoprolol tartrate, sodium chloride flush, sodium chloride flush   Assessment/Plan:   1. 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 Now back in NSR after TEE/DC-CV yesterday  2. 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  - continue spiro  - Co-ox ok.    3. 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 Please do not stop. Will continue 20 daily each.  - She will need outpatient f/u with endo -  Thyroid u/s - small nodule. Otherwise unremarkable  4. Hypokalemia - supped  4. H/o frequent PVCs   Ok to d/c home today on the following meds  Eliquis 5 bid Digoxin 0.125 daily Toprol 25 bid Losartan 12.5 mg qhs Spiro 12.5mg   Lasix 40mg  MWF Prednisone 20 daily Methimazole 20 daily   Stop:  Amiodarone Carvedilol ASA  Will arrange close f/u in [facility] Clinic. Will also need f/u with Endo. She ahs had trouble getting meds in past. Need to ensure compliance/access to meds.   Length of Stay: 5   Glori Bickers MD 03/10/2019, 2:14 PM  Advanced Heart Failure Team Pager [phone] (M-F; [place])  Please contact [facility] for night-coverage after hours (4p -7a ) and weekends on [email or web address]

## 2019-03-10 NOTE — Discharge Summary (Addendum)
Physician Discharge Summary  Megan Keith XHB:[id number] DOB: 02-Feb-1961 DOA: 03/05/2019  PCP: Aldona Bar, MD  Admit date: 03/05/2019 Discharge date: 03/10/2019  Admitted From: Home  Disposition:  Home   Recommendations for Outpatient Follow-up and new medication changes:  1. Follow up with Dr. Tomma Lightning in 7 days.  2. Amiodarone and carvedilol has been discontinued. 3. Patient has been placed on digoxin and metoprolol. 4. Continue methimazole and prednisone. 5.  Follow with endocrinology as outpatient. 6. Added spironolactone.    Home Health: no  Equipment/Devices: no    Discharge Condition: stable  CODE STATUS: full  Diet recommendation: heart healthy   Brief/Interim Summary: 58 year old female who presented with palpitations. She is known to have the significant past medical history for nonischemic cardiomyopathy, ejection fraction 25 to 30%, hypertension, GERD, and left breast cancer status post lumpectomy and adjuvant radiation/hormonal therapy. She reported weight gain, orthopnea and lower extremity edema. On her initial physical examination blood pressure was 90/64, 108/84, heart rate 116-120-150, respiratory rate 18, temperature 98.9, oxygen saturation 98%. She had moist mucous membranes,her lungs were clear to auscultation bilaterally, heart S1-S2 present irregularly irregular120-150,her abdomen was soft, trace lower extremity edema. Sodium 139, potassium 3.2, chloride 103, bicarb 25, glucose 121, BUN 12, creatinine 0.7, white count 9.4, hemoglobin 14.5, hematocrit 42.7, platelets 316, TSH 0.011, free T4 2.4,SARS COVID-19 was negative. EKG had 115bpm, atrial flutter with variable block,no ST segment or T wave changes. Her chest x-ray had vascular congestion, cardiomegaly, CT chest negative for pulmonary embolism, positive pulmonary edema.  Patient was admitted to the hospital working diagnosis acute on chronic systolic heart failure exacerbation  complicated by atrial fibrillation with rapid ventricular response.  1.  Atrial fibrillation/flutter with rapid ventricular response.  Patient was admitted to the progressive care unit, she received AV blockade with beta-blocker and digoxin with persistent uncontrolled atrial fibrillation.  She was placed on anticoagulation with apixaban, underwent transesophageal echocardiography and electrical cardioversion, converting successfully to sinus rhythm.  Patient will continue taking metoprolol succinate twice daily and digoxin 0.25 mg daily.  Anticoagulation with apixaban.  2.  Acute on chronic decompensated systolic heart failure.  Patient received diuresis with IV furosemide, she achieved a negative fluid balance (-1,326), with significant improvement of her symptoms.  Patient will continue heart failure management with digoxin, metoprolol succinate and spironolactone.  3.  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  She will continue this treatment as an outpatient, follow-up with primary care.  4.  Hypokalemia.  Potassium was corrected with potassium chloride.  Discharge Diagnoses:  Principal Problem:   Atrial fibrillation with RVR ([place]) Active Problems:   Acute on chronic HFrEF (heart failure with reduced ejection fraction) ([place])   Hypotension   Hyperthyroidism    Discharge Instructions   Allergies as of 03/10/2019   No Known Allergies     Medication List    STOP taking these medications   amiodarone 200 MG tablet Commonly known as: Pacerone   aspirin 81 MG chewable tablet   carvedilol 3.125 MG tablet Commonly known as: COREG     TAKE these medications   acetaminophen 325 MG tablet Commonly known as: TYLENOL Take 2 tablets (650 mg total) by mouth every 4 (four) hours as needed for headache or mild pain.   anastrozole 1 MG tablet Commonly known as: ARIMIDEX Take 1 mg by mouth daily.    apixaban 5 MG Tabs tablet Commonly known as: ELIQUIS Take 1 tablet (5 mg total) by  mouth 2 (two) times daily.   digoxin 0.125 MG tablet Commonly known as: Lanoxin Take 1 tablet (125 mcg total) by mouth daily.   furosemide 40 MG tablet Commonly known as: Lasix Monday- Wednesday-Friday.   losartan 25 MG tablet Commonly known as: COZAAR Take 0.5 tablets (12.5 mg total) by mouth at bedtime.   methimazole 10 MG tablet Commonly known as: TAPAZOLE Take 2 tablets (20 mg total) by mouth daily.   metoprolol succinate 25 MG 24 hr tablet Commonly known as: TOPROL-XL Take 1 tablet (25 mg total) by mouth 2 (two) times a day.   pantoprazole 40 MG tablet Commonly known as: Protonix Take 1 tablet (40 mg total) by mouth daily. Tome 1 pastilla un vez al dia   predniSONE 20 MG tablet Commonly known as: DELTASONE Take 1 tablet (20 mg total) by mouth daily with breakfast.   spironolactone 25 MG tablet Commonly known as: ALDACTONE Take 1 tablet (25 mg total) by mouth daily. Tome 1 pastilla un vez a la hora de dormir   Vitamin B 12 500 MCG Tabs Take 1,000 mcg by mouth daily.      Follow-up Information    Aldona Bar, MD. Schedule an appointment as soon as possible for a visit in 1 week(s).   Specialty: Internal Medicine Why: Unable to schedule appointment- patient needs to fill out new application for sliding scale fee & bring 3 most recent pay stubs. Office has a CSW to assist with patient assistance applications for medications. No onsite pharmacy.  Contact information: [place] [address] [phone]          No Known Allergies  Consultations:  Cardiology   EP    Procedures/Studies: Dg Chest Port 1 View  Result Date: 03/06/2019 CLINICAL DATA:  PICC line placement EXAM: PORTABLE CHEST 1 VIEW COMPARISON:  Chest x-rays dated 03/04/2019 and 03/08/2018. FINDINGS: RIGHT-sided PICC line is been placed with tip well positioned at the lower  SVC/cavoatrial junction. Stable cardiomegaly. Lungs are clear. No pleural effusion or pneumothorax seen. Osseous structures about the chest are unremarkable. IMPRESSION: 1. Right-sided PICC line in place with tip well positioned at the lower SVC/cavoatrial junction. 2. Stable cardiomegaly. 3. No pneumothorax seen. Electronically Signed   By: Franki Cabot M.D.   On: 03/06/2019 14:17   US Thyroid  Result Date: 03/07/2019 CLINICAL DATA:  Hyperthyroidism EXAM: THYROID ULTRASOUND TECHNIQUE: Ultrasound examination of the thyroid gland and adjacent soft tissues was performed. COMPARISON:  None. FINDINGS: Parenchymal Echotexture: Normal Isthmus: 0.5 cm thickness Right lobe: 3.6 x 1.7 x 1.7 cm Left lobe: 4.3 x 1.7 x 1.7 cm _________________________________________________________ Estimated total number of nodules >/= 1 cm: 0 Number of spongiform nodules >/=  2 cm not described below (TR1): 0 Number of mixed cystic and solid nodules >/= 1.5 cm not described below (TR2): 0 _________________________________________________________ 0.8 cm hypoechoic nodule without calcifications, inferior pole right lobe; This nodule does NOT meet TI-RADS criteria for biopsy or dedicated follow-up. IMPRESSION: 1. Subcentimeter right thyroid nodule which does not meet criteria for biopsy or follow-up. Otherwise negative. The above is in keeping with the ACR TI-RADS recommendations - J Am Coll Radiol 2017;14:587-595. Electronically Signed   By: Lucrezia Europe M.D.   On: 03/07/2019 07:22   [place] Ekg Site Rite  Result Date: 03/05/2019 If Site Rite image not attached, placement could not be confirmed due to current cardiac rhythm.     Procedures: TEE and cardioversion.   Subjective: Patient is feeling better, no dyspnea  or chest pain.   Discharge Exam: Vitals:   03/10/19 0950 03/10/19 1206  BP: 111/62 (!) 105/56  Pulse: 80 93  Resp:  (!) 21  Temp:    SpO2:  99%   Vitals:   03/10/19 0433 03/10/19 0433 03/10/19 0950 03/10/19 1206   BP: 107/61 107/61 111/62 (!) 105/56  Pulse: 69 60 80 93  Resp:    (!) 21  Temp: 97.7 F (36.5 C) 97.7 F (36.5 C)    TempSrc: Oral Oral    SpO2: 100% 100%  99%  Weight: 64.9 kg       General: not in pain or dyspnea.  Neurology: Awake and alert, non focal  E ENT: no pallor, no icterus, oral mucosa moist Cardiovascular: No JVD. S1-S2 present, rhythmic, no gallops, rubs, or murmurs. No lower extremity edema. Pulmonary: positive breath sounds bilaterally, adequate air movement, no wheezing, rhonchi or rales. Gastrointestinal. Abdomen with no organomegaly, non tender, no rebound or guarding Skin. No rashes Musculoskeletal: no joint deformities   The results of significant diagnostics from this hospitalization (including imaging, microbiology, ancillary and laboratory) are listed below for reference.     Microbiology: Recent Results (from the past 240 hour(s))  SARS Coronavirus 2 (CEPHEID - Performed in [place] hospital lab), Hosp Order     Status: None   Collection Time: 03/05/19 10:05 PM   Specimen: Nasopharyngeal Swab  Result Value Ref Range Status   SARS Coronavirus 2 NEGATIVE NEGATIVE Final    Comment: (NOTE) If result is NEGATIVE SARS-CoV-2 target nucleic acids are NOT DETECTED. The SARS-CoV-2 RNA is generally detectable in upper and lower  respiratory specimens during the acute phase of infection. The lowest  concentration of SARS-CoV-2 viral copies this assay can detect is 250  copies / mL. A negative result does not preclude SARS-CoV-2 infection  and should not be used as the sole basis for treatment or other  patient management decisions.  A negative result may occur with  improper specimen collection / handling, submission of specimen other  than nasopharyngeal swab, presence of viral mutation(s) within the  areas targeted by this assay, and inadequate number of viral copies  (<250 copies / mL). A negative result must be combined with clinical  observations,  patient history, and epidemiological information. If result is POSITIVE SARS-CoV-2 target nucleic acids are DETECTED. The SARS-CoV-2 RNA is generally detectable in upper and lower  respiratory specimens dur ing the acute phase of infection.  Positive  results are indicative of active infection with SARS-CoV-2.  Clinical  correlation with patient history and other diagnostic information is  necessary to determine patient infection status.  Positive results do  not rule out bacterial infection or co-infection with other viruses. If result is PRESUMPTIVE POSTIVE SARS-CoV-2 nucleic acids MAY BE PRESENT.   A presumptive positive result was obtained on the submitted specimen  and confirmed on repeat testing.  While 2019 novel coronavirus  (SARS-CoV-2) nucleic acids may be present in the submitted sample  additional confirmatory testing may be necessary for epidemiological  and / or clinical management purposes  to differentiate between  SARS-CoV-2 and other Sarbecovirus currently known to infect humans.  If clinically indicated additional testing with an alternate test  methodology [phone]) is advised. The SARS-CoV-2 RNA is generally  detectable in upper and lower respiratory sp ecimens during the acute  phase of infection. The expected result is Negative. Fact Sheet for Patients:  StrictlyIdeas.no Fact Sheet for Healthcare Providers: BankingDealers.co.za This test is not yet approved  or cleared by the [place] and has been authorized for detection and/or diagnosis of SARS-CoV-2 by FDA under an Emergency Use Authorization (EUA).  This EUA will remain in effect (meaning this test can be used) for the duration of the COVID-19 declaration under Section 564(b)(1) of the Act, 21 U.S.C. section 360bbb-3(b)(1), unless the authorization is terminated or revoked sooner. Performed at [facility], [address] [place],  [place] [address]      Labs: BNP (last 3 results) No results for input(s): BNP in the last 8760 hours. Basic Metabolic Panel: Recent Labs  Lab 03/05/19 2017 03/06/19 0522 03/07/19 0723 03/08/19 0554 03/09/19 0911 03/10/19 0329  NA 139 139 139 139 137 136  K 3.2* 4.6 3.5 3.5 3.9 3.6  CL 103 107 103 104 103 102  CO2 25 22 27 25 23 25   GLUCOSE 121* 149* 112* 105* 107* 95  BUN 12 13 17 18 19 20   CREATININE 0.70 0.74 0.77 0.81 0.70 0.71  CALCIUM 9.4 10.0 9.4 9.7 9.6 9.4  MG 2.2 2.1  --  2.3 2.1  --    Liver Function Tests: Recent Labs  Lab 03/05/19 2017 03/06/19 0522 03/10/19 0329  AST 31 28 15   ALT 60* 59* 20  ALKPHOS 106 109 103  BILITOT 1.0 0.9 0.9  PROT 6.7 7.0 6.5  ALBUMIN 3.8 4.0 3.6   No results for input(s): LIPASE, AMYLASE in the last 168 hours. No results for input(s): AMMONIA in the last 168 hours. CBC: Recent Labs  Lab 03/05/19 2017 03/06/19 0522 03/07/19 0723 03/08/19 0554 03/10/19 0329  WBC 9.4 7.9 9.7 12.3* 8.8  HGB 14.5 15.5* 14.8 15.2* 14.6  HCT 42.7 45.8 45.0 45.6 43.1  MCV 92.8 93.7 95.3 93.8 93.5  PLT 316 342 308 321 295   Cardiac Enzymes: No results for input(s): CKTOTAL, CKMB, CKMBINDEX, TROPONINI in the last 168 hours. BNP: Invalid input(s): POCBNP CBG: No results for input(s): GLUCAP in the last 168 hours. D-Dimer No results for input(s): DDIMER in the last 72 hours. Hgb A1c No results for input(s): HGBA1C in the last 72 hours. Lipid Profile No results for input(s): CHOL, HDL, LDLCALC, TRIG, CHOLHDL, LDLDIRECT in the last 72 hours. Thyroid function studies Recent Labs    03/08/19 0555  TSH 0.016*   Anemia work up No results for input(s): VITAMINB12, FOLATE, FERRITIN, TIBC, IRON, RETICCTPCT in the last 72 hours. Urinalysis No results found for: COLORURINE, APPEARANCEUR, LABSPEC, [place], GLUCOSEU, [place], [place], KETONESUR, PROTEINUR, UROBILINOGEN, NITRITE, LEUKOCYTESUR Sepsis Labs Invalid input(s): PROCALCITONIN,  WBC,   LACTICIDVEN Microbiology Recent Results (from the past 240 hour(s))  SARS Coronavirus 2 (CEPHEID - Performed in [place] hospital lab), Hosp Order     Status: None   Collection Time: 03/05/19 10:05 PM   Specimen: Nasopharyngeal Swab  Result Value Ref Range Status   SARS Coronavirus 2 NEGATIVE NEGATIVE Final    Comment: (NOTE) If result is NEGATIVE SARS-CoV-2 target nucleic acids are NOT DETECTED. The SARS-CoV-2 RNA is generally detectable in upper and lower  respiratory specimens during the acute phase of infection. The lowest  concentration of SARS-CoV-2 viral copies this assay can detect is 250  copies / mL. A negative result does not preclude SARS-CoV-2 infection  and should not be used as the sole basis for treatment or other  patient management decisions.  A negative result may occur with  improper specimen collection / handling, submission of specimen other  than nasopharyngeal swab, presence of viral mutation(s)  within the  areas targeted by this assay, and inadequate number of viral copies  (<250 copies / mL). A negative result must be combined with clinical  observations, patient history, and epidemiological information. If result is POSITIVE SARS-CoV-2 target nucleic acids are DETECTED. The SARS-CoV-2 RNA is generally detectable in upper and lower  respiratory specimens dur ing the acute phase of infection.  Positive  results are indicative of active infection with SARS-CoV-2.  Clinical  correlation with patient history and other diagnostic information is  necessary to determine patient infection status.  Positive results do  not rule out bacterial infection or co-infection with other viruses. If result is PRESUMPTIVE POSTIVE SARS-CoV-2 nucleic acids MAY BE PRESENT.   A presumptive positive result was obtained on the submitted specimen  and confirmed on repeat testing.  While 2019 novel coronavirus  (SARS-CoV-2) nucleic acids may be present in the submitted sample   additional confirmatory testing may be necessary for epidemiological  and / or clinical management purposes  to differentiate between  SARS-CoV-2 and other Sarbecovirus currently known to infect humans.  If clinically indicated additional testing with an alternate test  methodology [phone]) is advised. The SARS-CoV-2 RNA is generally  detectable in upper and lower respiratory sp ecimens during the acute  phase of infection. The expected result is Negative. Fact Sheet for Patients:  StrictlyIdeas.no Fact Sheet for Healthcare Providers: BankingDealers.co.za This test is not yet approved or cleared by the [place] FDA and has been authorized for detection and/or diagnosis of SARS-CoV-2 by FDA under an Emergency Use Authorization (EUA).  This EUA will remain in effect (meaning this test can be used) for the duration of the COVID-19 declaration under Section 564(b)(1) of the Act, 21 U.S.C. section 360bbb-3(b)(1), unless the authorization is terminated or revoked sooner. Performed at [facility], [place] [address], [place], [place] [address]      Time coordinating discharge: 45 minutes  SIGNED:   Tawni Millers, MD  [facility] 03/10/2019, 12:42 PM

## 2019-03-11 ENCOUNTER — Encounter (HOSPITAL_COMMUNITY): Payer: Self-pay | Admitting: Internal Medicine

## 2019-04-03 ENCOUNTER — Encounter (HOSPITAL_COMMUNITY): Payer: Self-pay | Admitting: Internal Medicine

## 2019-04-03 ENCOUNTER — Other Ambulatory Visit: Payer: Self-pay

## 2019-04-03 ENCOUNTER — Ambulatory Visit (HOSPITAL_COMMUNITY)
Admission: RE | Admit: 2019-04-03 | Discharge: 2019-04-03 | Disposition: A | Discharge status: Home / Self Care | Payer: Self-pay | Source: Ambulatory Visit | Attending: Internal Medicine | Admitting: Internal Medicine

## 2019-04-03 ENCOUNTER — Ambulatory Visit (HOSPITAL_BASED_OUTPATIENT_CLINIC_OR_DEPARTMENT_OTHER)
Admission: RE | Admit: 2019-04-03 | Discharge: 2019-04-03 | Disposition: A | Discharge status: Home / Self Care | Payer: Self-pay | Source: Ambulatory Visit | Attending: Internal Medicine | Admitting: Internal Medicine

## 2019-04-03 VITALS — BP 116/64 | HR 83 | Wt 148.4 lb

## 2019-04-03 DIAGNOSIS — Z9119 Patient's noncompliance with other medical treatment and regimen: Secondary | ICD-10-CM | POA: Insufficient documentation

## 2019-04-03 DIAGNOSIS — Z8249 Family history of ischemic heart disease and other diseases of the circulatory system: Secondary | ICD-10-CM | POA: Insufficient documentation

## 2019-04-03 DIAGNOSIS — I493 Ventricular premature depolarization: Secondary | ICD-10-CM | POA: Insufficient documentation

## 2019-04-03 DIAGNOSIS — I480 Paroxysmal atrial fibrillation: Secondary | ICD-10-CM

## 2019-04-03 DIAGNOSIS — I428 Other cardiomyopathies: Secondary | ICD-10-CM | POA: Insufficient documentation

## 2019-04-03 DIAGNOSIS — Z853 Personal history of malignant neoplasm of breast: Secondary | ICD-10-CM | POA: Insufficient documentation

## 2019-04-03 DIAGNOSIS — I5022 Chronic systolic (congestive) heart failure: Secondary | ICD-10-CM

## 2019-04-03 DIAGNOSIS — Z9114 Patient's other noncompliance with medication regimen: Secondary | ICD-10-CM | POA: Insufficient documentation

## 2019-04-03 DIAGNOSIS — Z7901 Long term (current) use of anticoagulants: Secondary | ICD-10-CM | POA: Insufficient documentation

## 2019-04-03 DIAGNOSIS — Z79899 Other long term (current) drug therapy: Secondary | ICD-10-CM | POA: Insufficient documentation

## 2019-04-03 DIAGNOSIS — Z923 Personal history of irradiation: Secondary | ICD-10-CM | POA: Insufficient documentation

## 2019-04-03 DIAGNOSIS — C50912 Malignant neoplasm of unspecified site of left female breast: Secondary | ICD-10-CM | POA: Insufficient documentation

## 2019-04-03 DIAGNOSIS — E0590 Thyrotoxicosis, unspecified without thyrotoxic crisis or storm: Secondary | ICD-10-CM | POA: Insufficient documentation

## 2019-04-03 DIAGNOSIS — E0580 Other thyrotoxicosis without thyrotoxic crisis or storm: Secondary | ICD-10-CM

## 2019-04-03 DIAGNOSIS — M1990 Unspecified osteoarthritis, unspecified site: Secondary | ICD-10-CM | POA: Insufficient documentation

## 2019-04-03 LAB — TSH: TSH: 0.024 u[IU]/mL — ABNORMAL LOW (ref 0.350–4.500)

## 2019-04-03 LAB — COMPREHENSIVE METABOLIC PANEL
ALT: 16 U/L (ref 0–44)
AST: 19 U/L (ref 15–41)
Albumin: 4.1 g/dL (ref 3.5–5.0)
Alkaline Phosphatase: 105 U/L (ref 38–126)
Anion gap: 12 (ref 5–15)
BUN: 9 mg/dL (ref 6–20)
CO2: 21 mmol/L — ABNORMAL LOW (ref 22–32)
Calcium: 9.6 mg/dL (ref 8.9–10.3)
Chloride: 104 mmol/L (ref 98–111)
Creatinine, Ser: 0.7 mg/dL (ref 0.44–1.00)
GFR calc Af Amer: >60 mL/min (ref >60)
GFR calc non Af Amer: >60 mL/min (ref >60)
Glucose, Bld: 104 mg/dL — ABNORMAL HIGH (ref 70–99)
Potassium: 3.6 mmol/L (ref 3.5–5.1)
Sodium: 137 mmol/L (ref 135–145)
Total Bilirubin: 0.7 mg/dL (ref 0.3–1.2)
Total Protein: 6.5 g/dL (ref 6.5–8.1)

## 2019-04-03 LAB — CBC
HCT: 43.7 % (ref 36.0–46.0)
Hemoglobin: 14.4 g/dL (ref 12.0–15.0)
MCH: 31.6 pg (ref 26.0–34.0)
MCHC: 33 g/dL (ref 30.0–36.0)
MCV: 96 fL (ref 80.0–100.0)
Platelets: 290 10*3/uL (ref 150–400)
RBC: 4.55 MIL/uL (ref 3.87–5.11)
RDW: 13.9 % (ref 11.5–15.5)
WBC: 11.7 10*3/uL — ABNORMAL HIGH (ref 4.0–10.5)
nRBC: 0 % (ref 0.0–0.2)

## 2019-04-03 LAB — T4, FREE: Free T4: 1.65 ng/dL — ABNORMAL HIGH (ref 0.61–1.12)

## 2019-04-03 MED ORDER — PREDNISONE 10 MG PO TABS: 10.0000 mg | ORAL_TABLET | Freq: Every day | ORAL | 6 refills | Status: DC

## 2019-04-03 MED ORDER — FUROSEMIDE 40 MG PO TABS: 20.0000 mg | ORAL_TABLET | Freq: Every day | ORAL | 11 refills | Status: DC

## 2019-04-03 MED ORDER — METHIMAZOLE 10 MG PO TABS: 10.0000 mg | ORAL_TABLET | Freq: Every day | ORAL | 11 refills | Status: DC

## 2019-04-03 NOTE — Progress Notes (Signed)
  Echocardiogram 2D Echocardiogram has been performed.  Bobbye Charleston 04/03/2019, 1:51 PM

## 2019-04-03 NOTE — Progress Notes (Signed)
Advanced Heart Failure Clinic Note    Primary Care: No PCP Primary Cardiologist: Dr. Haroldine Laws, Dr. [name] in [place]   HPI: Megan Keith is a 58 y.o. 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    She presented to [facility] on 04/13/17 with neck pain, SOB and orthopnea. Last EF prior to that reported by Dr. [name] was 35%. Repeat echo showed reduced EF of 15%, she was transferred to [facility] for further evaluation. Underwent right and left heart catheterization on 04/15/17, no CAD. Fick output/index 5.28/3.34. Full results below.   Noted to have >20% PVC on telemetry. She was started on IV Amio and transitioned to po Amio. Also started on digoxin and spiro prior to discharge. BP was too soft to add any other medications. Discharge weight was 143 pounds.   Admitted 7/13-18/2020 with ADHF in setting of new-onset AF in the setting of hyperthyroidism. TEE with EF 10% RV ok. Started on amiodarone and diuresed. Underwent TEE/DC-CV and felt much better. Also treated with methimazole and prednisone.   Interpreter present.   She returns today for HF follow up. Says she feels ok. Still with DOE Confused about her medicines (which is a common problem for her). Not taking lasix. Weight up 3 pound. Mild edema, orthopnea. No PND. Denies palpitations.  Echo 03/2017 EF 10-15% Echo 07/2017 EF 25-30%  Echo 11/2017 EF 25-30% Echo 06/2018: EF ~30% (per Dr Haroldine Laws)  Review of systems complete and found to be negative unless listed in HPI.   Past Medical History:  Diagnosis Date  . Abnormal TSH   . Anemia   . Arthritis    "knees, hands" (04/15/2017)  . Breast cancer ([place])   . Chronic knee pain   . Chronic systolic CHF (congestive heart failure) ([place])   . Does not have health insurance   . Frequent PVCs   . NICM (nonischemic cardiomyopathy) ([place])    a. ? PVC cardiomyopathy. right and left heart  catheterization on 04/15/17, no CAD. Fick output/index 5.28/3.34.  . Noncompliance    a. episodic noncompliance.  . Pneumonia 06/2016    Current Outpatient Medications  Medication Sig Dispense Refill  . acetaminophen (TYLENOL) 325 MG tablet Take 2 tablets (650 mg total) by mouth every 4 (four) hours as needed for headache or mild pain.    Marland Kitchen anastrozole (ARIMIDEX) 1 MG tablet Take 1 mg by mouth daily.    Marland Kitchen apixaban (ELIQUIS) 5 MG TABS tablet Take 1 tablet (5 mg total) by mouth 2 (two) times daily. 60 tablet 11  . Cyanocobalamin (VITAMIN B 12) 500 MCG TABS Take 1,000 mcg by mouth daily.     . digoxin (LANOXIN) 0.125 MG tablet Take 1 tablet (125 mcg total) by mouth daily. 30 tablet 0  . furosemide (LASIX) 40 MG tablet Monday- Wednesday-Friday. 30 tablet 11  . losartan (COZAAR) 25 MG tablet Take 0.5 tablets (12.5 mg total) by mouth at bedtime. 15 tablet 0  . methimazole (TAPAZOLE) 10 MG tablet Take 2 tablets (20 mg total) by mouth daily. 30 tablet 11  . metoprolol succinate (TOPROL-XL) 25 MG 24 hr tablet Take 1 tablet (25 mg total) by mouth 2 (two) times a day. 60 tablet 11  . pantoprazole (PROTONIX) 40 MG tablet Take 1 tablet (40 mg total) by mouth daily. Tome 1 pastilla un vez al dia 30 tablet 0  . pantoprazole (PROTONIX) 40 MG tablet Take 1 tablet (  40 mg total) by mouth daily. Tome 1 pastilla un vez al dia 30 tablet 0  . predniSONE (DELTASONE) 20 MG tablet Take 1 tablet (20 mg total) by mouth daily with breakfast. 30 tablet 11  . spironolactone (ALDACTONE) 25 MG tablet Take 1 tablet (25 mg total) by mouth daily. Tome 1 pastilla un vez a la hora de dormir 30 tablet 0   No current facility-administered medications for this encounter.    No Known Allergies  Social History   Socioeconomic History  . Marital status: Married    Spouse name: Not on file  . Number of children: Not on file  . Years of education: Not on file  . Highest education level: Not on file  Occupational History  . Not  on file  Social Needs  . Financial resource strain: Not on file  . Food insecurity    Worry: Not on file    Inability: Not on file  . Transportation needs    Medical: Not on file    Non-medical: Not on file  Tobacco Use  . Smoking status: Never Smoker  . Smokeless tobacco: Never Used  Substance and Sexual Activity  . Alcohol use: Yes    Comment: 04/15/2017 "might have a couple beers q couple months"  . Drug use: No  . Sexual activity: Never  Lifestyle  . Physical activity    Days per week: Not on file    Minutes per session: Not on file  . Stress: Not on file  Relationships  . Social Herbalist on phone: Not on file    Gets together: Not on file    Attends religious service: Not on file    Active member of club or organization: Not on file    Attends meetings of clubs or organizations: Not on file    Relationship status: Not on file  . Intimate partner violence    Fear of current or ex partner: Not on file    Emotionally abused: Not on file    Physically abused: Not on file    Forced sexual activity: Not on file  Other Topics Concern  . Not on file  Social History Narrative  . Not on file   Family History  Problem Relation Age of Onset  . Hypertension Mother    Vitals:   04/03/19 1414  BP: 116/64  Pulse: 83  SpO2: 98%  Weight: 67.3 kg (148 lb 6.4 oz)    Wt Readings from Last 3 Encounters:  04/03/19 67.3 kg (148 lb 6.4 oz)  03/10/19 64.9 kg (143 lb)  08/11/18 67.1 kg (148 lb)   PHYSICAL EXAM: General:  Well appearing. No resp difficulty HEENT: normal Neck: supple. JVP 7 Carotids 2+ bilat; no bruits. No lymphadenopathy or thryomegaly appreciated. Cor: PMI nondisplaced. Regular rate & rhythm with ectopy. No rubs, gallops or murmurs. Lungs: clear Abdomen: soft, nontender, nondistended. No hepatosplenomegaly. No bruits or masses. Good bowel sounds. Extremities: no cyanosis, clubbing, rash, edema Neuro: alert & orientedx3, cranial nerves grossly  intact. moves all 4 extremities w/o difficulty. Affect pleasant  ECG: NSR 83 with frequent monomorphic PVCs/bigeminy. Personally reviewed   ASSESSMENT & PLAN: 1. Chronic systolic CHF: NICM, normal cors in 03/2017. 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 03/2017  EF 15-20% Echo 07/28/17 EF 25-30%  - Echo 11/2017: EF 25-30%.  - Echo 06/2018 EF ~30% - Echo 7/20 in setting of PAF with RVR. EF 10% - Remains very tenuous and management complicated by language barrier and poor  understanding of meds - Currently NYHA II-II symptoms - Volume status stable on exam.   - Continue lasix 20 mg daily - Continue spiro 25 mg qhs  - Continue losartan at 12.5 mg qhs.  - Start coreg 3.125 mg BID - On amio to suppress PVCs and AF.  - Unable to get cMRI due to lack of insurance.  - I again Dr Haroldine Laws discussed ICD. She does not currently have insurance. Suspect cost will be prohibitive. Will continue to work on [place], and if EF does not improve or patient wishes to move forward, will discuss with EP to see if she would qualify for an ICD under a charity program.   2. PAF - new onset 7/20 - underwent TEE/DC-CV - on amio - maintaining NSR - Continue apixaban. No significant bleeding.   3. Hyperthyroidism - suspect due to amio - on prednisone and methimazole - decrease both to 10mg  daily - arrange visit with Dr. [name] in Endocrinology for assistance - Recheck labs today  3. Frequent PVC's - On amio. Still with PVCs today but AF suppressed. - may need to consider PVC ablation at some point,.   4. Left Breast Cancer. - Finished XRT  5. Frequent HAs - Brain MRI ok   Total time spent >45 minutes. Over half that time spent discussing above and going through medications one-by-one with help of interpreter. Glori Bickers, MD 04/03/19 2:17 PM

## 2019-04-03 NOTE — Patient Instructions (Addendum)
TAKE Lasix 20mg  (1 tab) daily  DECREASE Prednisone to 10mg  (1 tab) daily  DECREASE Methimazole to 10mg  (1 tab) daily  Your provider has recommended that  you wear a Zio Patch for 14 days.  This monitor will record your heart rhythm for our review.  IF you have any symptoms while wearing the monitor please press the button.  If you have any issues with the patch or you notice a red or orange light on it please call the company at [phone].  Once you remove the patch please mail it back to the company as soon as possible so we can get the results.   Labs today We will only contact you if something comes back abnormal or we need to make some changes. Otherwise no news is good news!  You have been referred to Dr Cruzita Lederer (Endocrinology) for Hyperthyroidism.  Her office will call you to schedule this appointment.   Your physician recommends that you schedule a follow-up appointment in: 3-4 weeks with Dr Haroldine Laws   At the [facility], you and your health needs are our priority. As part of our continuing mission to provide you with exceptional heart care, we have created designated Provider Care Teams. These Care Teams include your primary Cardiologist (physician) and Advanced Practice Providers (APPs- Physician Assistants and Nurse Practitioners) who all work together to provide you with the care you need, when you need it.   You may see any of the following providers on your designated Care Team at your next follow up: Marland Kitchen Dr Glori Bickers . Dr Loralie Champagne . Darrick Grinder, NP   Please be sure to bring in all your medications bottles to every appointment.

## 2019-04-04 LAB — T3, FREE: T3, Free: 3.6 pg/mL (ref 2.0–4.4)

## 2019-04-11 ENCOUNTER — Telehealth (HOSPITAL_COMMUNITY): Payer: Self-pay | Admitting: *Deleted

## 2019-04-11 ENCOUNTER — Other Ambulatory Visit (HOSPITAL_COMMUNITY): Payer: Self-pay | Admitting: *Deleted

## 2019-04-11 MED ORDER — APIXABAN 5 MG PO TABS: 5.0000 mg | ORAL_TABLET | Freq: Two times a day (BID) | ORAL | 11 refills | Status: DC

## 2019-04-11 NOTE — Telephone Encounter (Signed)
Pt left vm requesting a refill but did not say the name of medication. Called pt at [phone] as requested no answer/no vm set up. Called [phone] and recording stated this wasn't an active phone number.

## 2019-04-19 ENCOUNTER — Other Ambulatory Visit (HOSPITAL_COMMUNITY): Payer: Self-pay | Admitting: *Deleted

## 2019-04-19 NOTE — Telephone Encounter (Signed)
pts daughter called stating she can not afford eliquis. Pt requests help with patient assistance. Samples left at the front desk with pt assistance paperwork. Paperwork needs to be given to [employer] once completed.

## 2019-05-07 ENCOUNTER — Ambulatory Visit (HOSPITAL_COMMUNITY)
Admission: RE | Admit: 2019-05-07 | Discharge: 2019-05-07 | Disposition: A | Discharge status: Home / Self Care | Payer: Self-pay | Source: Ambulatory Visit | Attending: Internal Medicine | Admitting: Internal Medicine

## 2019-05-07 ENCOUNTER — Telehealth (HOSPITAL_COMMUNITY): Payer: Self-pay | Admitting: Pharmacy Technician

## 2019-05-07 ENCOUNTER — Other Ambulatory Visit: Payer: Self-pay

## 2019-05-07 VITALS — BP 102/64 | HR 62 | Wt 151.8 lb

## 2019-05-07 DIAGNOSIS — Z7952 Long term (current) use of systemic steroids: Secondary | ICD-10-CM | POA: Insufficient documentation

## 2019-05-07 DIAGNOSIS — M1990 Unspecified osteoarthritis, unspecified site: Secondary | ICD-10-CM | POA: Insufficient documentation

## 2019-05-07 DIAGNOSIS — I428 Other cardiomyopathies: Secondary | ICD-10-CM | POA: Insufficient documentation

## 2019-05-07 DIAGNOSIS — Z923 Personal history of irradiation: Secondary | ICD-10-CM | POA: Insufficient documentation

## 2019-05-07 DIAGNOSIS — Z8249 Family history of ischemic heart disease and other diseases of the circulatory system: Secondary | ICD-10-CM | POA: Insufficient documentation

## 2019-05-07 DIAGNOSIS — Z79899 Other long term (current) drug therapy: Secondary | ICD-10-CM | POA: Insufficient documentation

## 2019-05-07 DIAGNOSIS — Z853 Personal history of malignant neoplasm of breast: Secondary | ICD-10-CM | POA: Insufficient documentation

## 2019-05-07 DIAGNOSIS — F419 Anxiety disorder, unspecified: Secondary | ICD-10-CM | POA: Insufficient documentation

## 2019-05-07 DIAGNOSIS — I5022 Chronic systolic (congestive) heart failure: Secondary | ICD-10-CM | POA: Insufficient documentation

## 2019-05-07 DIAGNOSIS — Z8701 Personal history of pneumonia (recurrent): Secondary | ICD-10-CM | POA: Insufficient documentation

## 2019-05-07 DIAGNOSIS — I480 Paroxysmal atrial fibrillation: Secondary | ICD-10-CM | POA: Insufficient documentation

## 2019-05-07 DIAGNOSIS — E0590 Thyrotoxicosis, unspecified without thyrotoxic crisis or storm: Secondary | ICD-10-CM | POA: Insufficient documentation

## 2019-05-07 DIAGNOSIS — I4891 Unspecified atrial fibrillation: Secondary | ICD-10-CM

## 2019-05-07 DIAGNOSIS — I509 Heart failure, unspecified: Secondary | ICD-10-CM

## 2019-05-07 DIAGNOSIS — I493 Ventricular premature depolarization: Secondary | ICD-10-CM | POA: Insufficient documentation

## 2019-05-07 LAB — COMPREHENSIVE METABOLIC PANEL
ALT: 17 U/L (ref 0–44)
AST: 19 U/L (ref 15–41)
Albumin: 4 g/dL (ref 3.5–5.0)
Alkaline Phosphatase: 105 U/L (ref 38–126)
Anion gap: 9 (ref 5–15)
BUN: 17 mg/dL (ref 6–20)
CO2: 24 mmol/L (ref 22–32)
Calcium: 9.4 mg/dL (ref 8.9–10.3)
Chloride: 108 mmol/L (ref 98–111)
Creatinine, Ser: 0.86 mg/dL (ref 0.44–1.00)
GFR calc Af Amer: >60 mL/min (ref >60)
GFR calc non Af Amer: >60 mL/min (ref >60)
Glucose, Bld: 130 mg/dL — ABNORMAL HIGH (ref 70–99)
Potassium: 4.5 mmol/L (ref 3.5–5.1)
Sodium: 141 mmol/L (ref 135–145)
Total Bilirubin: 0.3 mg/dL (ref 0.3–1.2)
Total Protein: 6.7 g/dL (ref 6.5–8.1)

## 2019-05-07 LAB — BRAIN NATRIURETIC PEPTIDE: B Natriuretic Peptide: 477 pg/mL — ABNORMAL HIGH (ref 0.0–100.0)

## 2019-05-07 LAB — TSH: TSH: 0.575 u[IU]/mL (ref 0.350–4.500)

## 2019-05-07 LAB — T4, FREE: Free T4: 1.09 ng/dL (ref 0.61–1.12)

## 2019-05-07 MED ORDER — SPIRONOLACTONE 25 MG PO TABS: 25.0000 mg | ORAL_TABLET | Freq: Every day | ORAL | 0 refills | Status: DC

## 2019-05-07 MED ORDER — METOPROLOL SUCCINATE ER 25 MG PO TB24: 25.0000 mg | ORAL_TABLET | Freq: Every day | ORAL | 11 refills | Status: DC

## 2019-05-07 MED ORDER — LOSARTAN POTASSIUM 25 MG PO TABS: 12.5000 mg | ORAL_TABLET | Freq: Every day | ORAL | 0 refills | Status: DC

## 2019-05-07 MED ORDER — FUROSEMIDE 40 MG PO TABS: 20.0000 mg | ORAL_TABLET | Freq: Every day | ORAL | 11 refills | Status: DC

## 2019-05-07 MED ORDER — PREDNISONE 10 MG PO TABS: 10.0000 mg | ORAL_TABLET | Freq: Every day | ORAL | 6 refills | Status: DC

## 2019-05-07 MED ORDER — METHIMAZOLE 10 MG PO TABS: 10.0000 mg | ORAL_TABLET | Freq: Every day | ORAL | 11 refills | Status: DC

## 2019-05-07 MED FILL — LOSARTAN POTASSIUM 25 MG TA: 25 | 30 days supply | Qty: 15 | Fill #0

## 2019-05-07 MED FILL — FUROSEMIDE 40 MG TAB: 40 | 30 days supply | Qty: 15 | Fill #0

## 2019-05-07 MED FILL — METOPROLOL SUCCINATE ER 25: 25 | 30 days supply | Qty: 30 | Fill #0

## 2019-05-07 MED FILL — SPIRONOLACTONE 25 MG TABLET: 25 | 30 days supply | Qty: 30 | Fill #0

## 2019-05-07 NOTE — Telephone Encounter (Signed)
Received a half completed portion for patient assistance of Eliquis on my desk. Got provider signature and sent in paperwork to [employer].  Will continue to follow.  Phone #:[phone]  Fax #: [place], CPhT

## 2019-05-07 NOTE — Progress Notes (Addendum)
3 boxes of Eliquis samples given. 5mg  tabs. Lot # [id number].  Expiration: June 2022.   Patient Assistance Signed by pt for Eliquis. Pt consents to daughter Wendee Copp being called to find out household income.

## 2019-05-07 NOTE — Progress Notes (Addendum)
Advanced Heart Failure Clinic Note    Primary Care: Dr. Charlotte Sanes  Primary Cardiologist: Dr. Haroldine Laws, Dr. [name] in [place]   HPI: Megan Keith is a 58 y.o. 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    She presented to [facility] on 04/13/17 with neck pain, SOB and orthopnea. Last EF prior to that reported by Dr. [name] was 35%. Repeat echo showed reduced EF of 15%, she was transferred to [facility] for further evaluation. Underwent right and left heart catheterization on 04/15/17, no CAD. Fick output/index 5.28/3.34. Full results below.   Noted to have >20% PVC on telemetry. She was started on IV Amio and transitioned to po Amio. Also started on digoxin and spiro prior to discharge. BP was too soft to add any other medications. Discharge weight was 143 pounds.   Admitted 7/13-18/2020 with ADHF in setting of new-onset AF in the setting of hyperthyroidism. TEE with EF 10% RV ok. Started on amiodarone and diuresed. Underwent TEE/DC-CV and felt much better. Also treated with methimazole and prednisone.   I saw her 1 month ago. Weight up a few pounds. Zio patch placed to monitor PVC burden   Zio patch 8/20 - NSR avg HR 75 - 13 runs NSVT longest 14 beats - 59 runs SVT longest 9.4 seconds - PVC burden 4.8%   She returns today for HF follow up. Interpreter present. She is very anxious- constant hand wringing and pressured speech.  Says she had an episode of palpitations on Saturday that lasted 20 mins. + presyncope. Continues to struggle with her medicines. Says she ran out of Eliquis because she could not afford it. Also not taking digoxin. Says she often fees her face tingling and she gets dizzy - says this happens very frequently. . Walking 2-3 times per day. For 15 mins at a time. Denies CP or SOB. No edema, orthopnea or PND.    Echo 8/20 EF 25-30%   Echo 03/2017 EF 10-15% Echo  07/2017 EF 25-30%  Echo 11/2017 EF 25-30% Echo 06/2018: EF ~30% (per Dr Haroldine Laws)  Review of systems complete and found to be negative unless listed in HPI.   Past Medical History:  Diagnosis Date  . Abnormal TSH   . Anemia   . Arthritis    "knees, hands" (04/15/2017)  . Breast cancer ([place])   . Chronic knee pain   . Chronic systolic CHF (congestive heart failure) ([place])   . Does not have health insurance   . Frequent PVCs   . NICM (nonischemic cardiomyopathy) ([place])    a. ? PVC cardiomyopathy. right and left heart catheterization on 04/15/17, no CAD. Fick output/index 5.28/3.34.  . Noncompliance    a. episodic noncompliance.  . Pneumonia 06/2016    Current Outpatient Medications  Medication Sig Dispense Refill  . acetaminophen (TYLENOL) 325 MG tablet Take 2 tablets (650 mg total) by mouth every 4 (four) hours as needed for headache or mild pain.    Marland Kitchen anastrozole (ARIMIDEX) 1 MG tablet Take 1 mg by mouth daily.    Marland Kitchen apixaban (ELIQUIS) 5 MG TABS tablet Take 1 tablet (5 mg total) by mouth 2 (two) times daily. 60 tablet 11  . Ascorbic Acid (VITAMIN C) 1000 MG tablet Take 1,000 mg by mouth daily.    . Cholecalciferol (D3-1000) 25 MCG (1000 UT) tablet Take 1,000 Units by mouth daily.    . Cyanocobalamin (VITAMIN B 12)  500 MCG TABS Take 1,000 mcg by mouth daily.     . furosemide (LASIX) 40 MG tablet Take 0.5 tablets (20 mg total) by mouth daily. 30 tablet 11  . losartan (COZAAR) 25 MG tablet Take 0.5 tablets (12.5 mg total) by mouth at bedtime. 15 tablet 0  . methimazole (TAPAZOLE) 10 MG tablet Take 1 tablet (10 mg total) by mouth daily. 30 tablet 11  . metoprolol succinate (TOPROL-XL) 25 MG 24 hr tablet Take 1 tablet (25 mg total) by mouth 2 (two) times a day. 60 tablet 11  . predniSONE (DELTASONE) 10 MG tablet Take 1 tablet (10 mg total) by mouth daily with breakfast. 30 tablet 6  . pantoprazole (PROTONIX) 40 MG tablet Take 1 tablet (40 mg total) by mouth daily. Tome 1 pastilla un vez al  dia 30 tablet 0  . spironolactone (ALDACTONE) 25 MG tablet Take 1 tablet (25 mg total) by mouth daily. Tome 1 pastilla un vez a la hora de dormir 30 tablet 0   No current facility-administered medications for this encounter.    No Known Allergies  Social History   Socioeconomic History  . Marital status: Married    Spouse name: Not on file  . Number of children: Not on file  . Years of education: Not on file  . Highest education level: Not on file  Occupational History  . Not on file  Social Needs  . Financial resource strain: Not on file  . Food insecurity    Worry: Not on file    Inability: Not on file  . Transportation needs    Medical: Not on file    Non-medical: Not on file  Tobacco Use  . Smoking status: Never Smoker  . Smokeless tobacco: Never Used  Substance and Sexual Activity  . Alcohol use: Yes    Comment: 04/15/2017 "might have a couple beers q couple months"  . Drug use: No  . Sexual activity: Never  Lifestyle  . Physical activity    Days per week: Not on file    Minutes per session: Not on file  . Stress: Not on file  Relationships  . Social Herbalist on phone: Not on file    Gets together: Not on file    Attends religious service: Not on file    Active member of club or organization: Not on file    Attends meetings of clubs or organizations: Not on file    Relationship status: Not on file  . Intimate partner violence    Fear of current or ex partner: Not on file    Emotionally abused: Not on file    Physically abused: Not on file    Forced sexual activity: Not on file  Other Topics Concern  . Not on file  Social History Narrative  . Not on file   Family History  Problem Relation Age of Onset  . Hypertension Mother    Vitals:   05/07/19 1523  BP: 102/64  Pulse: 62  SpO2: 98%  Weight: 68.8 kg (151 lb 12 oz)    Wt Readings from Last 3 Encounters:  05/07/19 68.8 kg (151 lb 12 oz)  04/03/19 67.3 kg (148 lb 6.4 oz)  03/10/19  64.9 kg (143 lb)   PHYSICAL EXAM: General:  Anxious. Wringing hands. Pressured speech. No resp difficulty HEENT: normal Neck: supple. no JVD. Carotids 2+ bilat; no bruits. No lymphadenopathy or thryomegaly appreciated. Cor: PMI nondisplaced. Regularly irregular. No rubs, gallops or murmurs.  Lungs: clear Abdomen: obese soft, nontender, nondistended. No hepatosplenomegaly. No bruits or masses. Good bowel sounds. Extremities: no cyanosis, clubbing, rash, trace edema Neuro: alert & orientedx3, cranial nerves grossly intact. moves all 4 extremities w/o difficulty. Affect pleasant  ECG: NSR 79 with frequent monomorphic PVCs/bigeminy. Personally reviewed   ASSESSMENT & PLAN: 1. Chronic systolic CHF: NICM, normal cors in 03/2017. 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 03/2017  EF 15-20% Echo 07/28/17 EF 25-30%  - Echo 11/2017: EF 25-30%.  - Echo 06/2018 EF ~30% - Echo 7/20 in setting of PAF with RVR. EF 10% - Echo 8/20 EF 25-30%  - Functional status has improved. NYHA II but still very anxious and having frequent palpitations - Volume status stable on exam.   - Continue lasix 20 mg daily. Can take extra as needed - Continue spiro 25 mg qhs  - Continue losartan at 12.5 mg qhs - BP too low to titrate - Continue Toprol 25 bid - Unable to get cMRI due to lack of insurance.  - Suspect she may have PVC CM. Amio stopped due to hyperthyroidism. Will refer to EP to discuss ICD +/- PVC ablation (vs mexilitene)  2. PAF - new onset 7/20 - underwent TEE/DC-CV - maintaining NSR off amio due to hyperthyroidisme - Off apixaban due to cost.  - We have arranged SW to see and try to help her obtain  3. Hyperthyroidism - suspect due to amio -> stopped - on prednisone and methimazole. Will continue - will again try toarrange visit with Dr. [name] in Endocrinology for assistance - Recheck labs today  4. Frequent PVCs - Zio patch with 5% PVC burden  - Suspect she may have PVC CM. Amio stopped due to hyperthyroidism.  Will refer to EP to discuss ICD +/- PVC ablation (vs mexilitene)  5. Left Breast Cancer. - Finished XRT  6. Anxiety - severe. Have asked her to discuss therapy with her PCP   Total time spent 45 minutes. Over half that time spent discussing above.     Glori Bickers, MD 05/07/19 3:49 PM

## 2019-05-07 NOTE — Patient Instructions (Signed)
Lab work done today. We will notify you of any abnormal lab work. No news is good news!  EKG done today.  You have been referred to Cardiac Electrophysiology and Dr. Arman Filter office. Their office will contact your daughter Alinda Sierras in order to schedule an appointment.  Please follow up with the [facility] in 2 months.   At the [facility], you and your health needs are our priority. As part of our continuing mission to provide you with exceptional heart care, we have created designated Provider Care Teams. These Care Teams include your primary Cardiologist (physician) and Advanced Practice Providers (APPs- Physician Assistants and Nurse Practitioners) who all work together to provide you with the care you need, when you need it.   You may see any of the following providers on your designated Care Team at your next follow up: Marland Kitchen Dr Glori Bickers . Dr Loralie Champagne . Darrick Grinder, NP   Please be sure to bring in all your medications bottles to every appointment.

## 2019-05-08 LAB — T3, FREE: T3, Free: 3 pg/mL (ref 2.0–4.4)

## 2019-05-09 NOTE — Telephone Encounter (Signed)
Patient was approved to receive Eliquis through [employer].   ID: [id number]  Effective Dates: 05/08/2019 through 05/07/2020  [employer] will send out her Eliquis after speaking with the patient. Called [facility], the pharmacy that ships medications for [facility] to make sure they know she needs a spanish interpreter and they do not have a profile set up to ship her medication out yet. Advised me to call back tomorrow.  Called patient with the interpreter and we left a voicemail on her home phone. Called daughter Wendee Copp who stated that [place] was unavailable and she hung up before we could get any further.  Will follow up.  Charlann Boxer, CPhT

## 2019-05-11 NOTE — Telephone Encounter (Signed)
Called and spoke with [facility] and they had the patient's profile set up and I made sure they knew that she needed a [place] interpreter.  Charlann Boxer, CPhT

## 2019-05-16 ENCOUNTER — Telehealth (INDEPENDENT_AMBULATORY_CARE_PROVIDER_SITE_OTHER): Payer: Self-pay | Admitting: Internal Medicine

## 2019-05-16 ENCOUNTER — Encounter: Payer: Self-pay | Admitting: Internal Medicine

## 2019-05-16 ENCOUNTER — Other Ambulatory Visit: Payer: Self-pay

## 2019-05-16 VITALS — Ht 64.0 in | Wt 146.0 lb

## 2019-05-16 DIAGNOSIS — I509 Heart failure, unspecified: Secondary | ICD-10-CM

## 2019-05-16 NOTE — Progress Notes (Signed)
Given language barrier, I think that in office visit is appropriate.  We will reschedule with Dr Curt Bears in [place] where she lives.

## 2019-06-06 ENCOUNTER — Encounter (HOSPITAL_COMMUNITY): Payer: Self-pay

## 2019-06-06 ENCOUNTER — Inpatient Hospital Stay (HOSPITAL_COMMUNITY): Payer: HRSA Program

## 2019-06-06 ENCOUNTER — Inpatient Hospital Stay (HOSPITAL_COMMUNITY)
Admission: AD | Admit: 2019-06-06 | Discharge: 2019-06-09 | DRG: 177 | Disposition: A | Discharge status: Home / Self Care | Payer: HRSA Program | Source: Other Acute Inpatient Hospital | Attending: Internal Medicine | Admitting: Internal Medicine

## 2019-06-06 ENCOUNTER — Other Ambulatory Visit: Payer: Self-pay

## 2019-06-06 DIAGNOSIS — I5022 Chronic systolic (congestive) heart failure: Secondary | ICD-10-CM | POA: Diagnosis present

## 2019-06-06 DIAGNOSIS — J1289 Other viral pneumonia: Secondary | ICD-10-CM | POA: Diagnosis present

## 2019-06-06 DIAGNOSIS — R0602 Shortness of breath: Secondary | ICD-10-CM

## 2019-06-06 DIAGNOSIS — Z853 Personal history of malignant neoplasm of breast: Secondary | ICD-10-CM

## 2019-06-06 DIAGNOSIS — A0839 Other viral enteritis: Secondary | ICD-10-CM | POA: Diagnosis present

## 2019-06-06 DIAGNOSIS — E860 Dehydration: Secondary | ICD-10-CM | POA: Diagnosis present

## 2019-06-06 DIAGNOSIS — M1990 Unspecified osteoarthritis, unspecified site: Secondary | ICD-10-CM | POA: Diagnosis present

## 2019-06-06 DIAGNOSIS — K219 Gastro-esophageal reflux disease without esophagitis: Secondary | ICD-10-CM | POA: Diagnosis present

## 2019-06-06 DIAGNOSIS — U071 COVID-19: Principal | ICD-10-CM | POA: Diagnosis present

## 2019-06-06 DIAGNOSIS — E0590 Thyrotoxicosis, unspecified without thyrotoxic crisis or storm: Secondary | ICD-10-CM | POA: Diagnosis present

## 2019-06-06 DIAGNOSIS — T461X5A Adverse effect of calcium-channel blockers, initial encounter: Secondary | ICD-10-CM | POA: Diagnosis present

## 2019-06-06 DIAGNOSIS — Z79811 Long term (current) use of aromatase inhibitors: Secondary | ICD-10-CM

## 2019-06-06 DIAGNOSIS — I480 Paroxysmal atrial fibrillation: Secondary | ICD-10-CM | POA: Diagnosis present

## 2019-06-06 DIAGNOSIS — Z7901 Long term (current) use of anticoagulants: Secondary | ICD-10-CM | POA: Diagnosis not present

## 2019-06-06 DIAGNOSIS — Z8249 Family history of ischemic heart disease and other diseases of the circulatory system: Secondary | ICD-10-CM | POA: Diagnosis not present

## 2019-06-06 DIAGNOSIS — C50919 Malignant neoplasm of unspecified site of unspecified female breast: Secondary | ICD-10-CM | POA: Diagnosis present

## 2019-06-06 DIAGNOSIS — I959 Hypotension, unspecified: Secondary | ICD-10-CM | POA: Diagnosis present

## 2019-06-06 DIAGNOSIS — E0580 Other thyrotoxicosis without thyrotoxic crisis or storm: Secondary | ICD-10-CM | POA: Diagnosis present

## 2019-06-06 DIAGNOSIS — I5042 Chronic combined systolic (congestive) and diastolic (congestive) heart failure: Secondary | ICD-10-CM | POA: Diagnosis present

## 2019-06-06 DIAGNOSIS — I4891 Unspecified atrial fibrillation: Secondary | ICD-10-CM

## 2019-06-06 HISTORY — DX: COVID-19: U07.1

## 2019-06-06 LAB — COMPREHENSIVE METABOLIC PANEL
ALT: 25 U/L (ref 0–44)
AST: 23 U/L (ref 15–41)
Albumin: 3.4 g/dL — ABNORMAL LOW (ref 3.5–5.0)
Alkaline Phosphatase: 105 U/L (ref 38–126)
Anion gap: 11 (ref 5–15)
BUN: 13 mg/dL (ref 6–20)
CO2: 24 mmol/L (ref 22–32)
Calcium: 8.7 mg/dL — ABNORMAL LOW (ref 8.9–10.3)
Chloride: 103 mmol/L (ref 98–111)
Creatinine, Ser: 0.67 mg/dL (ref 0.44–1.00)
GFR calc Af Amer: >60 mL/min (ref >60)
GFR calc non Af Amer: >60 mL/min (ref >60)
Glucose, Bld: 133 mg/dL — ABNORMAL HIGH (ref 70–99)
Potassium: 3.3 mmol/L — ABNORMAL LOW (ref 3.5–5.1)
Sodium: 138 mmol/L (ref 135–145)
Total Bilirubin: 0.4 mg/dL (ref 0.3–1.2)
Total Protein: 6 g/dL — ABNORMAL LOW (ref 6.5–8.1)

## 2019-06-06 LAB — CBC WITH DIFFERENTIAL/PLATELET
Abs Immature Granulocytes: 0.05 10*3/uL (ref 0.00–0.07)
Basophils Absolute: 0 10*3/uL (ref 0.0–0.1)
Basophils Relative: 0 %
Eosinophils Absolute: 0 10*3/uL (ref 0.0–0.5)
Eosinophils Relative: 0 %
HCT: 39.4 % (ref 36.0–46.0)
Hemoglobin: 12.8 g/dL (ref 12.0–15.0)
Immature Granulocytes: 0 %
Lymphocytes Relative: 8 %
Lymphs Abs: 0.9 10*3/uL (ref 0.7–4.0)
MCH: 30.1 pg (ref 26.0–34.0)
MCHC: 32.5 g/dL (ref 30.0–36.0)
MCV: 92.7 fL (ref 80.0–100.0)
Monocytes Absolute: 0.6 10*3/uL (ref 0.1–1.0)
Monocytes Relative: 5 %
Neutro Abs: 9.7 10*3/uL — ABNORMAL HIGH (ref 1.7–7.7)
Neutrophils Relative %: 87 %
Platelets: 324 10*3/uL (ref 150–400)
RBC: 4.25 MIL/uL (ref 3.87–5.11)
RDW: 13.5 % (ref 11.5–15.5)
WBC: 11.2 10*3/uL — ABNORMAL HIGH (ref 4.0–10.5)
nRBC: 0 % (ref 0.0–0.2)

## 2019-06-06 LAB — MAGNESIUM: Magnesium: 2.2 mg/dL (ref 1.7–2.4)

## 2019-06-06 LAB — PROCALCITONIN: Procalcitonin: <0.1 ng/mL

## 2019-06-06 LAB — FIBRINOGEN: Fibrinogen: 419 mg/dL (ref 210–475)

## 2019-06-06 LAB — TYPE AND SCREEN
ABO/RH(D): O POS
Antibody Screen: NEGATIVE

## 2019-06-06 LAB — TROPONIN I (HIGH SENSITIVITY): Troponin I (High Sensitivity): 14 ng/L (ref <18)

## 2019-06-06 LAB — C-REACTIVE PROTEIN: CRP: <0.8 mg/dL (ref <1.0)

## 2019-06-06 LAB — D-DIMER, QUANTITATIVE: D-Dimer, Quant: 0.54 ug/mL-FEU — ABNORMAL HIGH (ref 0.00–0.50)

## 2019-06-06 LAB — TSH: TSH: 0.393 u[IU]/mL (ref 0.350–4.500)

## 2019-06-06 LAB — BRAIN NATRIURETIC PEPTIDE: B Natriuretic Peptide: 1146.8 pg/mL — ABNORMAL HIGH (ref 0.0–100.0)

## 2019-06-06 LAB — T4, FREE: Free T4: 1.63 ng/dL — ABNORMAL HIGH (ref 0.61–1.12)

## 2019-06-06 LAB — FERRITIN: Ferritin: 20 ng/mL (ref 11–307)

## 2019-06-06 LAB — PHOSPHORUS: Phosphorus: 3.8 mg/dL (ref 2.5–4.6)

## 2019-06-06 LAB — ABO/RH: ABO/RH(D): O POS

## 2019-06-06 MED ORDER — METHYLPREDNISOLONE SODIUM SUCC 125 MG IJ SOLR
80.0000 mg | INTRAMUSCULAR | Status: DC
  Administered 2019-06-06: 80 mg via INTRAVENOUS
  Filled 2019-06-06: qty 2

## 2019-06-06 MED ORDER — PREDNISONE 10 MG PO TABS
10.0000 mg | ORAL_TABLET | Freq: Every day | ORAL | Status: DC
  Administered 2019-06-09: 10 mg via ORAL
  Filled 2019-06-06: qty 1

## 2019-06-06 MED ORDER — POTASSIUM CHLORIDE 2 MEQ/ML IV SOLN: INTRAVENOUS | Status: DC

## 2019-06-06 MED ORDER — ALPRAZOLAM 0.5 MG PO TABS
0.5000 mg | ORAL_TABLET | Freq: Every evening | ORAL | Status: DC | PRN
  Filled 2019-06-06: qty 1

## 2019-06-06 MED ORDER — METHYLPREDNISOLONE SODIUM SUCC 125 MG IJ SOLR
60.0000 mg | Freq: Two times a day (BID) | INTRAMUSCULAR | Status: DC
  Administered 2019-06-06: 60 mg via INTRAVENOUS
  Filled 2019-06-06: qty 2

## 2019-06-06 MED ORDER — POTASSIUM CHLORIDE CRYS ER 20 MEQ PO TBCR
40.0000 meq | EXTENDED_RELEASE_TABLET | Freq: Once | ORAL | Status: AC
  Administered 2019-06-06: 40 meq via ORAL
  Filled 2019-06-06: qty 2

## 2019-06-06 MED ORDER — PANTOPRAZOLE SODIUM 40 MG PO TBEC
40.0000 mg | DELAYED_RELEASE_TABLET | Freq: Every day | ORAL | Status: DC
  Administered 2019-06-06 – 2019-06-09 (×4): 40 mg via ORAL
  Filled 2019-06-06 (×4): qty 1

## 2019-06-06 MED ORDER — METHIMAZOLE 10 MG PO TABS
10.0000 mg | ORAL_TABLET | Freq: Two times a day (BID) | ORAL | Status: DC
  Administered 2019-06-06 – 2019-06-09 (×7): 10 mg via ORAL
  Filled 2019-06-06 (×9): qty 1

## 2019-06-06 MED ORDER — PREDNISONE 10 MG PO TABS: 10.0000 mg | ORAL_TABLET | Freq: Every day | ORAL | Status: DC

## 2019-06-06 MED ORDER — METOPROLOL SUCCINATE ER 25 MG PO TB24
25.0000 mg | ORAL_TABLET | Freq: Every day | ORAL | Status: DC
  Administered 2019-06-06: 25 mg via ORAL
  Filled 2019-06-06 (×2): qty 1

## 2019-06-06 MED ORDER — DIGOXIN 0.25 MG/ML IJ SOLN
0.5000 mg | Freq: Once | INTRAMUSCULAR | Status: AC
  Administered 2019-06-06: 0.5 mg via INTRAVENOUS
  Filled 2019-06-06: qty 2

## 2019-06-06 MED ORDER — MIDODRINE HCL 5 MG PO TABS: 10.0000 mg | ORAL_TABLET | Freq: Three times a day (TID) | ORAL | Status: DC

## 2019-06-06 MED ORDER — POTASSIUM CHLORIDE CRYS ER 20 MEQ PO TBCR
20.0000 meq | EXTENDED_RELEASE_TABLET | Freq: Once | ORAL | Status: AC
  Administered 2019-06-06: 20 meq via ORAL
  Filled 2019-06-06: qty 1

## 2019-06-06 MED ORDER — LACTATED RINGERS IV SOLN
INTRAVENOUS | Status: DC
  Administered 2019-06-06: 09:00:00 via INTRAVENOUS

## 2019-06-06 MED ORDER — VITAMIN C 500 MG PO TABS
1000.0000 mg | ORAL_TABLET | Freq: Every day | ORAL | Status: DC
  Administered 2019-06-06 – 2019-06-09 (×4): 1000 mg via ORAL
  Filled 2019-06-06 (×4): qty 2

## 2019-06-06 MED ORDER — AMIODARONE HCL IN DEXTROSE 360-4.14 MG/200ML-% IV SOLN
30.0000 mg/h | INTRAVENOUS | Status: DC
  Administered 2019-06-06 – 2019-06-07 (×2): 30 mg/h via INTRAVENOUS
  Filled 2019-06-06 (×3): qty 200

## 2019-06-06 MED ORDER — POTASSIUM CHLORIDE 2 MEQ/ML IV SOLN
INTRAVENOUS | Status: DC
  Administered 2019-06-06: 13:00:00 via INTRAVENOUS
  Filled 2019-06-06 (×2): qty 1000

## 2019-06-06 MED ORDER — DIGOXIN 0.25 MG/ML IJ SOLN
0.1250 mg | Freq: Once | INTRAMUSCULAR | Status: AC
  Administered 2019-06-06: 20:00:00 0.125 mg via INTRAVENOUS
  Filled 2019-06-06: qty 0.5

## 2019-06-06 MED ORDER — PROCHLORPERAZINE EDISYLATE 10 MG/2ML IJ SOLN
5.0000 mg | INTRAMUSCULAR | Status: DC | PRN
  Filled 2019-06-06: qty 2

## 2019-06-06 MED ORDER — VITAMIN D3 25 MCG (1000 UNIT) PO TABS
1000.0000 [IU] | ORAL_TABLET | Freq: Every day | ORAL | Status: DC
  Administered 2019-06-06 – 2019-06-09 (×4): 1000 [IU] via ORAL
  Filled 2019-06-06 (×9): qty 1

## 2019-06-06 MED ORDER — DIGOXIN 125 MCG PO TABS
0.1250 mg | ORAL_TABLET | Freq: Every day | ORAL | Status: DC
  Administered 2019-06-07 – 2019-06-09 (×3): 0.125 mg via ORAL
  Filled 2019-06-06 (×4): qty 1

## 2019-06-06 MED ORDER — AMIODARONE HCL IN DEXTROSE 360-4.14 MG/200ML-% IV SOLN
60.0000 mg/h | INTRAVENOUS | Status: DC
  Administered 2019-06-06: 30 mg/h via INTRAVENOUS
  Filled 2019-06-06: qty 200

## 2019-06-06 MED ORDER — ACETAMINOPHEN 325 MG PO TABS: 650.0000 mg | ORAL_TABLET | Freq: Four times a day (QID) | ORAL | Status: DC | PRN

## 2019-06-06 MED ORDER — LACTATED RINGERS IV SOLN: INTRAVENOUS | Status: DC

## 2019-06-06 MED ORDER — POTASSIUM CHLORIDE CRYS ER 20 MEQ PO TBCR
10.0000 meq | EXTENDED_RELEASE_TABLET | Freq: Once | ORAL | Status: AC
  Administered 2019-06-06: 10 meq via ORAL
  Filled 2019-06-06: qty 1

## 2019-06-06 MED ORDER — ENSURE ENLIVE PO LIQD
237.0000 mL | Freq: Three times a day (TID) | ORAL | Status: DC
  Administered 2019-06-06 – 2019-06-09 (×7): 237 mL via ORAL

## 2019-06-06 MED ORDER — APIXABAN 5 MG PO TABS
5.0000 mg | ORAL_TABLET | Freq: Two times a day (BID) | ORAL | Status: DC
  Administered 2019-06-06 – 2019-06-09 (×7): 5 mg via ORAL
  Filled 2019-06-06 (×7): qty 1

## 2019-06-06 MED ORDER — ANASTROZOLE 1 MG PO TABS
1.0000 mg | ORAL_TABLET | Freq: Every day | ORAL | Status: DC
  Administered 2019-06-06 – 2019-06-09 (×4): 1 mg via ORAL
  Filled 2019-06-06 (×5): qty 1

## 2019-06-06 MED ORDER — DIGOXIN 125 MCG PO TABS
0.1250 mg | ORAL_TABLET | Freq: Every day | ORAL | Status: DC
  Filled 2019-06-06: qty 1

## 2019-06-06 MED ORDER — DIGOXIN 0.25 MG/ML IJ SOLN
0.2500 mg | Freq: Once | INTRAMUSCULAR | Status: AC
  Administered 2019-06-06: 0.25 mg via INTRAVENOUS
  Filled 2019-06-06: qty 1

## 2019-06-06 NOTE — H&P (Addendum)
History and Physical    Megan Keith [id number] DOB: 03/15/1961 DOA: 06/06/2019  PCP: Aldona Bar, MD   Patient coming from: Home.  I have personally briefly reviewed patient's old medical records in [place]  Chief Complaint: Weakness.  HPI: Megan Keith is a 58 y.o. 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  Other symptoms are fatigue, subjective low-grade fevers, chills, night sweats, decreased appetite, nonproductive cough, dyspnea, palpitations, diarrhea of about 3-4 episodes a day for about 11 days, which seems to be resolving for the past few days.  She denies rhinorrhea, sore throat, chest pain, abdominal pain, nausea, emesis, constipation, melena or hematochezia.  She denies dysuria, frequency or hematuria.  She denies polyuria, polydipsia, polyphagia or blurred vision.  ED Course: At [facility] ED, the patient was started on an amiodarone infusion.  This was followed by a Cardizem bolus followed by an infusion, which subsequently had to be stopped due to hypotension.  Patient was given digoxin 0.5 mg IVP after the case was discussed with cardiology.  She had been on oral digoxin at home, but is no longer taking it.  Work-up unremarkable urinalysis except for 1+ leukocyte esterase, but no significant WBC or bacteria on microscopic examination.  CBC showed a white count of 11.3 with 86% neutrophils, hemoglobin 13.6 g/dL and platelets 350.  PT was 10.9, INR 1.0 and APTT 25.6.  Troponin x2 was  normal.  However pro BNP was 5060.  SARS-2 coronavirus test was positive.  Procalcitonin was less than 0.05 ng/mL.  Renal and hepatic function were normal.  Glucose slightly elevated 114 mg/dL.  Lactic acid was 1.9.  Ferritin 17.6.  TSH was 0.29, free T4 2.91 and free T3 3.84.  Magnesium is elevated at 2.5 mg/dL.  Imaging: Her chest radiograph showed cardiomegaly with pulmonary vascular congestion.  CT head did not show any acute abnormalities.  However he showed chronic microvascular changes of white matter.  Review of Systems: As per HPI otherwise 10 point review of systems negative.   Past Medical History:  Diagnosis Date  . Abnormal TSH   . Anemia   . Arthritis    "knees, hands" (04/15/2017)  . Breast cancer ([place])   . Chronic knee pain   . Chronic systolic CHF (congestive heart failure) ([place])   . COVID-19   . Does not have health insurance   . Frequent PVCs   . Hyperthyroidism 03/05/2019  . Hyperthyroidism 03/05/2019  . 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 ([place]) 04/15/2017  . NICM (nonischemic cardiomyopathy) ([place])    a. ? PVC cardiomyopathy. right and left heart catheterization on 04/15/17, no CAD. Fick output/index 5.28/3.34.  . Noncompliance    a. episodic noncompliance.  . Pneumonia 06/2016    Past Surgical History:  Procedure Laterality Date  . BREAST CYST EXCISION Left   . CARDIOVERSION N/A 03/09/2019   Procedure: CARDIOVERSION;  Surgeon: Jolaine Artist, MD;  Location: [facility] ENDOSCOPY;  Service: Cardiovascular;  Laterality: N/A;  . CORONARY/GRAFT ANGIOGRAPHY N/A 04/15/2017   Procedure: Remus Blake ANGIOGRAPHY;  Surgeon: Nelva Bush, MD;  Location: [place] CV LAB;  Service: Cardiovascular;  Laterality: N/A;  .  RIGHT HEART CATH N/A 04/15/2017   Procedure: RIGHT HEART CATH;  Surgeon: Nelva Bush, MD;  Location: [place] CV LAB;  Service: Cardiovascular;  Laterality: N/A;  . TEE WITHOUT CARDIOVERSION N/A 03/09/2019   Procedure: TRANSESOPHAGEAL ECHOCARDIOGRAM  (TEE);  Surgeon: Jolaine Artist, MD;  Location[facility] ENDOSCOPY;  Service: Cardiovascular;  Laterality: N/A;  . TUBAL LIGATION       reports that she has never smoked. She has never used smokeless tobacco. She reports current alcohol use. She reports that she does not use drugs.  No Known Allergies  Family History  Problem Relation Age of Onset  . Hypertension Mother    Prior to Admission medications   Medication Sig Start Date End Date Taking? Authorizing Provider  acetaminophen (TYLENOL) 325 MG tablet Take 2 tablets (650 mg total) by mouth every 4 (four) hours as needed for headache or mild pain. 04/21/17   Shirley Friar, PA-C  anastrozole (ARIMIDEX) 1 MG tablet Take 1 mg by mouth daily.    [provider]  apixaban (ELIQUIS) 5 MG TABS tablet Take 1 tablet (5 mg total) by mouth 2 (two) times daily. 04/11/19   Bensimhon, Shaune Pascal, MD  Ascorbic Acid (VITAMIN C) 1000 MG tablet Take 1,000 mg by mouth daily.    [provider]  Cholecalciferol (D3-1000) 25 MCG (1000 UT) tablet Take 1,000 Units by mouth daily.    [provider]  Cyanocobalamin (VITAMIN B 12) 500 MCG TABS Take 1,000 mcg by mouth daily.     [provider]  furosemide (LASIX) 40 MG tablet Take 0.5 tablets (20 mg total) by mouth daily. 05/07/19   Bensimhon, Shaune Pascal, MD  losartan (COZAAR) 25 MG tablet Take 0.5 tablets (12.5 mg total) by mouth at bedtime. 05/07/19 06/06/19  Bensimhon, Shaune Pascal, MD  methimazole (TAPAZOLE) 10 MG tablet Take 1 tablet (10 mg total) by mouth daily. 05/07/19   Bensimhon, Shaune Pascal, MD  metoprolol succinate (TOPROL-XL) 25 MG 24 hr tablet Take 1 tablet (25 mg total) by mouth daily. 05/07/19   Bensimhon, Shaune Pascal, MD  pantoprazole (PROTONIX) 40 MG tablet Take 1 tablet (40 mg total) by mouth daily. Tome 1 pastilla un vez al dia 03/10/19 04/09/19  Arrien, Jimmy Picket, MD  predniSONE (DELTASONE) 10 MG tablet Take 1 tablet (10 mg total) by mouth daily with breakfast.  05/07/19   Bensimhon, Shaune Pascal, MD  spironolactone (ALDACTONE) 25 MG tablet Take 1 tablet (25 mg total) by mouth daily. Tome 1 pastilla un vez a la hora de dormir 05/07/19 06/06/19  Bensimhon, Shaune Pascal, MD    Physical Exam: Vitals:   06/06/19 0400  BP: 97/80  Pulse: (!) 120  Resp: 20  Temp: 98.9 F (37.2 C)  TempSrc: Oral  SpO2: 98%  Weight: 63.8 kg  Height: 5\' 4"  (1.626 m)    Constitutional: NAD, calm, comfortable Eyes: PERRL, lids and conjunctivae normal ENMT: Nasal cannula in place.  Mucous membranes are moist. Posterior pharynx clear of any exudate or lesions. Neck: normal, supple, no masses, no thyromegaly Respiratory: Good air entry with bibasilar rales. Normal respiratory effort. No accessory muscle use.  Cardiovascular: Tachycardic in the 110s to 120s with an irregularly irregular rhythm, no murmurs / rubs / gallops. No extremity edema. 2+ pedal pulses. No carotid bruits.  Abdomen: Nondistended. Bowel sounds positive.  Soft, no tenderness, no masses palpated. No hepatosplenomegaly.  Musculoskeletal: no clubbing / cyanosis.  Good ROM, no contractures. Normal muscle tone.  Skin: no rashes, lesions, ulcers.  No induration on limited dermatological examination. Neurologic: CN 2-12 grossly intact. Sensation intact, DTR normal.  Generalized weakness. Psychiatric: Normal judgment and insight. Alert and oriented x 3. Normal mood.   Labs on Admission: I have personally reviewed following labs and imaging studies  CBC: No results for input(s): WBC, NEUTROABS, HGB, HCT, MCV, PLT in the last 168 hours. Basic Metabolic Panel: No results for input(s): NA, K, CL, CO2, GLUCOSE, BUN, CREATININE, CALCIUM, MG, PHOS in the last 168 hours. GFR: CrCl cannot be calculated (Patient's most recent lab result is older than the maximum 21 days allowed.). Liver Function Tests: No results for input(s): AST, ALT, ALKPHOS, BILITOT, PROT, ALBUMIN in the last 168 hours. No results for input(s): LIPASE,  AMYLASE in the last 168 hours. No results for input(s): AMMONIA in the last 168 hours. Coagulation Profile: No results for input(s): INR, PROTIME in the last 168 hours. Cardiac Enzymes: No results for input(s): CKTOTAL, CKMB, CKMBINDEX, TROPONINI in the last 168 hours. BNP (last 3 results) No results for input(s): PROBNP in the last 8760 hours. HbA1C: No results for input(s): HGBA1C in the last 72 hours. CBG: No results for input(s): GLUCAP in the last 168 hours. Lipid Profile: No results for input(s): CHOL, HDL, LDLCALC, TRIG, CHOLHDL, LDLDIRECT in the last 72 hours. Thyroid Function Tests: No results for input(s): TSH, T4TOTAL, FREET4, T3FREE, THYROIDAB in the last 72 hours. Anemia Panel: No results for input(s): VITAMINB12, FOLATE, FERRITIN, TIBC, IRON, RETICCTPCT in the last 72 hours. Urine analysis: No results found for: COLORURINE, APPEARANCEUR, LABSPEC, PHURINE, GLUCOSEU, HGBUR, BILIRUBINUR, KETONESUR, PROTEINUR, UROBILINOGEN, NITRITE, LEUKOCYTESUR  Radiological Exams on Admission: No results found.  EKG: Independently reviewed. Atrial fibrillation with RVR  Assessment/Plan Principal Problem:   Atrial fibrillation with RVR (HCC) CHA?DS?-VASc Score of 3. Admit to progressive unit/inpatient. Continue supplemental oxygen. Continue amiodarone infusion. Administer second dose of digoxin 0.5 mg IVP x1. Resume oral digoxin after loading. Keep electrolytes optimized. Manage hyperthyroidism. Consult cardiology if rate is not controlled with current measures.  Active Problems:   COVID-19 Supportive care. GI symptoms improving last few days. Respiratory symptoms likely cardiac. Check and follow inflammatory markers.    Hypotension Hold antihypertensives. Trial of Midodrin.    Hyperthyroidism Increase methimazole to 10 mg p.o. twice daily. Follow thyroid function profile.     Chronic systolic CHF (congestive heart failure) (HCC) Holding diuretics due to hypotension.  Resume long-acting ER metoprolol if BP allows.    GERD (gastroesophageal reflux disease) Pantoprazole 40 mg p.o. daily.    Breast cancer ([place])  Continue Arimidex.   DVT prophylaxis: On apixaban. Code Status: Full code. Family Communication: Disposition Plan: Admit to progressive unit for rate control with amiodarone infusion, IV digoxin and COVID-19 treatment. Consults called: Admission status: Inpatient/progressive.   Reubin Milan MD [facility]  If 7PM-7AM, please contact night-coverage  06/06/2019, 6:16 AM   This document was prepared using Dragon voice recognition and may contain some unintended transcription errors.

## 2019-06-06 NOTE — Progress Notes (Addendum)
PROGRESS NOTE                                                                                                                                                                                                             Patient Demographics:    Megan Keith, is a 58 y.o. female, DOB - 1961/02/05, [id number]  Outpatient Primary MD for the patient is Aldona Bar, MD    LOS - 0  Admit date - 06/06/2019    CC -      Brief Narrative - Megan Keith is a 58 y.o. 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  She also had some diarrhea followed by some palpitations after which she arrived to [facility] ER where she was found to be hypotensive, in A. fib with RVR and had COVID positive test subsequently cardiology was consulted over the phone and she was cleared for [facility] admission.   Subjective:    [name] today has, No headache, No chest pain, No abdominal pain - No Nausea, No new weakness tingling or numbness, no Cough - SOB.     Assessment  & Plan :     1. Acute Covid 19 Viral gastroenteritis and pneumonitis during the ongoing 2020 Covid 19 Pandemic - she did come quite dehydrated and hypotensive, has been given IV fluids about a liter and a half in the last 10 hours.  Blood pressure has stabilized.  Continue supportive care, have placed her on steroids for now.    COVID-19 Labs  Recent Labs    06/06/19 0620  DDIMER 0.54*  FERRITIN 20  CRP <0.8    Lab Results  Component Value Date   SARSCOV2NAA NEGATIVE 03/05/2019     SpO2: 98 % O2 Flow Rate (L/min): 2 L/min  Hepatic Function Latest Ref Rng & Units 06/06/2019 05/07/2019 04/03/2019  Total Protein 6.5 - 8.1 g/dL 6.0(L) 6.7 6.5  Albumin 3.5 - 5.0 g/dL 3.4(L) 4.0 4.1  AST 15 - 41 U/L 23 19 19   ALT 0 -  44 U/L 25 17 16   Alk Phosphatase 38 - 126 U/L 105 105 105  Total Bilirubin 0.3 - 1.2 mg/dL 0.4 0.3 0.7  Bilirubin, Direct 0.0 - 0.2 mg/dL - - -        Component Value Date/Time   BNP 1,146.8 (H) 06/06/2019 0620      2.  Chronic systolic CHF EF 00%.  Currently appears compensated.  Blood pressure too low, at this time low-dose beta-blocker if tolerated by blood pressure.  Hold any ACE/ARB, Aldactone due to hypotension for now.  Elevated BNP likely due to RVR.  Will monitor clinically.  3.  Paroxysmal atrial fibrillation currently in RVR.  Mali vas 2 score greater than 3.  Continue Eliquis, currently on amnio drip along with low-dose beta-blocker, will give her 2 doses of IV digoxin to slow her down and then will try to get her off of amiodarone.  Was discharged follow-up with Dr. Sung Amabile, Dr. Sung Amabile to decide if patient will benefit from an AICD device in the long-term.  4.  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  TSH is stable.  On methimazole and prednisone at home, currently on IV steroids due to hypotension.  5.  GERD.  On PPI.  6. HX of breast cancer.  On Arimidex, outpatient follow-up.      Condition - Extremely Guarded  Family Communication  : None  Code Status :  Full  Diet :   Diet Order            Diet Heart Room service appropriate? Yes; Fluid consistency: Thin  Diet effective now               Disposition Plan  : Inpt  Consults  :  Cards by previous MD over the phone  Procedures  :    TTE 03/2019 -      1. The left ventricle has severely reduced systolic function, with an ejection fraction of 25-30%. The cavity size was normal. Left ventricular diastolic  function could not be evaluated secondary to atrial fibrillation. Left ventricular diffuse  hypokinesis.  2. The average left ventricular global longitudinal strain is -9.6 %.  3. The right ventricle has normal systolic function. The cavity was normal. There is no increase in right ventricular wall thickness.  4. Left atrial size was severely dilated.  5. There is mild to moderate mitral annular calcification present.  6. The aorta is normal in size and structure.   PUD Prophylaxis :  PPI  DVT Prophylaxis  :  Eliquis  Lab Results  Component Value Date   PLT 324 06/06/2019    Inpatient Medications  Scheduled Meds:  anastrozole  1 mg Oral Daily   apixaban  5 mg Oral BID   cholecalciferol  1,000 Units Oral Daily   [START ON 06/07/2019] digoxin  0.125 mg Oral Daily   feeding supplement (ENSURE ENLIVE)  237 mL Oral TID BM   methimazole  10 mg Oral BID   methylPREDNISolone (SOLU-MEDROL) injection  60 mg Intravenous Q12H   metoprolol succinate  25 mg Oral Daily   pantoprazole  40 mg Oral Daily   [START ON 06/09/2019] predniSONE  10 mg Oral Q breakfast   vitamin C  1,000 mg Oral Daily   Continuous Infusions:  amiodarone 30 mg/hr (06/06/19 1300)   lactated ringers with kcl 100 mL/hr at 06/06/19 1302   PRN Meds:.acetaminophen, prochlorperazine  Antibiotics  :    Anti-infectives (From admission, onward)   None  Time Spent in minutes  30   Lala Lund M.D on 06/06/2019 at 1:09 PM  To page go to [email or web address] - password [facility]  [facility] -  Office  [phone]   See all Orders from today for further details    Objective:   Vitals:   06/06/19 0400 06/06/19 0800 06/06/19 1213  BP: 97/80 97/75 104/77  Pulse: (!) 120 85 96  Resp: 20 (!) 25 19  Temp: 98.9 F (37.2 C) 98.3 F (36.8 C) 98.1 F (36.7 C)  TempSrc: Oral Oral Oral  SpO2: 98% 98% 98%  Weight: 63.8 kg    Height: 5' 4"  (1.626 m)      Wt Readings from Last 3 Encounters:    06/06/19 63.8 kg  05/16/19 66.2 kg  05/07/19 68.8 kg    No intake or output data in the 24 hours ending 06/06/19 1309   Physical Exam  Awake Alert,  No new F.N deficits, Normal affect [employer].AT,PERRAL Supple Neck,No JVD, No cervical lymphadenopathy appriciated.  Symmetrical Chest wall movement, Good air movement bilaterally, CTAB iRRR,No Gallops,Rubs or new Murmurs, No Parasternal Heave +ve B.Sounds, Abd Soft, No tenderness, No organomegaly appriciated, No rebound - guarding or rigidity. No Cyanosis, Clubbing or edema, No new Rash or bruise       Data Review:    CBC Recent Labs  Lab 06/06/19 0620  WBC 11.2*  HGB 12.8  HCT 39.4  PLT 324  MCV 92.7  MCH 30.1  MCHC 32.5  RDW 13.5  LYMPHSABS 0.9  MONOABS 0.6  EOSABS 0.0  BASOSABS 0.0    Chemistries  Recent Labs  Lab 06/06/19 0620  NA 138  K 3.3*  CL 103  CO2 24  GLUCOSE 133*  BUN 13  CREATININE 0.67  CALCIUM 8.7*  MG 2.2  AST 23  ALT 25  ALKPHOS 105  BILITOT 0.4   ------------------------------------------------------------------------------------------------------------------ No results for input(s): CHOL, HDL, LDLCALC, TRIG, CHOLHDL, LDLDIRECT in the last 72 hours.  Lab Results  Component Value Date   HGBA1C 5.6 03/06/2019   ------------------------------------------------------------------------------------------------------------------ Recent Labs    06/06/19 0620  TSH 0.393    Cardiac Enzymes No results for input(s): CKMB, TROPONINI, MYOGLOBIN in the last 168 hours.  Invalid input(s): CK ------------------------------------------------------------------------------------------------------------------    Component Value Date/Time   BNP 1,146.8 (H) 06/06/2019 0141    Micro Results No results found for this or any previous visit (from the past 240 hour(s)).  Radiology Reports No results found.

## 2019-06-06 NOTE — Evaluation (Signed)
Physical Therapy Evaluation Patient Details Name: Megan Keith MRN: [id number] DOB: 1961-05-07 Today's Date: 06/06/2019   History of Present Illness  58 y/o female w/ hx of episodic medical noncompliance, anemia, OA, breast cancer, frequent PVCs, L venttricular thrombus w/o MI, HTN, PNA, hyperthyroidism,  CHF, cardiomyopathy, a-fib w/ RVR. Presents to hospital w/ c/o weakness, fatigue, low grade fever, chills, night sweats, non productive cough, dyspnea, palpitations, diarrhea.  Clinical Impression    Pt admitted with above diagnosis. Pt with long hx of illness, states she lives in trailer with grandchildren to assist as needed. Pt currently with functional limitations due to the deficits listed below (see PT Problem List). Pt is at [facility] I with functional mob but is limited by dx and fatigue. Pt will benefit from skilled PT to increase her overall strength, activity tolerance,  independence and safety with mobility to allow discharge to the venue listed below.       Follow Up Recommendations No PT follow up    Equipment Recommendations  None recommended by PT    Recommendations for Other Services       Precautions / Restrictions Precautions Precautions: Fall Restrictions Weight Bearing Restrictions: No      Mobility  Bed Mobility Overal bed mobility: Modified Independent                Transfers Overall transfer level: Needs assistance Equipment used: None Transfers: Sit to/from Stand;Stand Pivot Transfers Sit to Stand: Supervision Stand pivot transfers: Supervision          Ambulation/Gait Ambulation/Gait assistance: Supervision Gait Distance (Feet): 120 Feet Assistive device: None Gait Pattern/deviations: Step-through pattern Gait velocity: rapid   General Gait Details: ambulates quite brickly needing cues to slow down as has elevated HR and in a-fib, she denies any discomfort with this, pt is on 1L/min via  and sats in 90s  throughout.  Stairs            Wheelchair Mobility    Modified Rankin (Stroke Patients Only)       Balance Overall balance assessment: Modified Independent                                           Pertinent Vitals/Pain Pain Assessment: No/denies pain    Home Living Family/patient expects to be discharged to:: Private residence Living Arrangements: Children Available Help at Discharge: Family Type of Home: Mobile home Home Access: Stairs to enter Entrance Stairs-Rails: Can reach both Entrance Stairs-Number of Steps: 4 Home Layout: One level   Additional Comments: denies need for AD at home and states grandchildren will help out    Prior Function Level of Independence: Independent               Hand Dominance        Extremity/Trunk Assessment   Upper Extremity Assessment Upper Extremity Assessment: Overall WFL for tasks assessed    Lower Extremity Assessment Lower Extremity Assessment: Overall WFL for tasks assessed    Cervical / Trunk Assessment Cervical / Trunk Assessment: Normal  Communication   Communication: Interpreter utilized  Cognition Arousal/Alertness: Awake/alert Behavior During Therapy: WFL for tasks assessed/performed Overall Cognitive Status: Within Functional Limits for tasks assessed  General Comments General comments (skin integrity, edema, etc.): Pt does well with mobility, she is at [facility] I level, ambulation distance is limited by fatigue, pt ambulates quite rapidly on room air and with no ad. 02 sats remain in 90s but Hr in 100s throughout .    Exercises     Assessment/Plan    PT Assessment Patient needs continued PT services(while in hospital)  PT Problem List Decreased strength;Decreased activity tolerance;Decreased coordination;Decreased mobility       PT Treatment Interventions Gait training;Functional mobility training;Therapeutic  activities;Therapeutic exercise;Neuromuscular re-education    PT Goals (Current goals can be found in the Care Plan section)  Acute Rehab PT Goals Time For Goal Achievement: 06/20/19 Potential to Achieve Goals: Good    Frequency Min 3X/week   Barriers to discharge        Co-evaluation               AM-PAC PT "6 Clicks" Mobility  Outcome Measure Help needed turning from your back to your side while in a flat bed without using bedrails?: None Help needed moving from lying on your back to sitting on the side of a flat bed without using bedrails?: None Help needed moving to and from a bed to a chair (including a wheelchair)?: None Help needed standing up from a chair using your arms (e.g., wheelchair or bedside chair)?: None Help needed to walk in hospital room?: A Little Help needed climbing 3-5 steps with a railing? : A Little 6 Click Score: 22    End of Session Equipment Utilized During Treatment: Oxygen Activity Tolerance: Treatment limited secondary to medical complications (Comment);Patient limited by fatigue Patient left: in bed;with call bell/phone within reach Nurse Communication: Mobility status PT Visit Diagnosis: Other abnormalities of gait and mobility (R26.89);Muscle weakness (generalized) (M62.81)    Time: [id number] PT Time Calculation (min) (ACUTE ONLY): 23 min   Charges:   PT Evaluation $PT Eval Low Complexity: 1 Low PT Treatments $Gait Training: 8-22 mins        Horald Chestnut, PT   Delford Field 06/06/2019, 4:37 PM

## 2019-06-06 NOTE — Progress Notes (Signed)
Initial Nutrition Assessment  DOCUMENTATION CODES:   Not applicable  INTERVENTION:    Ensure Enlive po TID, each supplement provides 350 kcal and 20 grams of protein  Pt receiving Hormel Shake daily with Breakfast which provides 520 kcals and 22 g of protein and Magic cup BID with lunch and dinner, each supplement provides 290 kcal and 9 grams of protein, automatically on meal trays to optimize nutritional intake.   NUTRITION DIAGNOSIS:   Increased nutrient needs related to acute illness(COVID-19) as evidenced by estimated needs.  GOAL:   Patient will meet greater than or equal to 90% of their needs  MONITOR:   PO intake, Supplement acceptance, Labs  REASON FOR ASSESSMENT:   Malnutrition Screening Tool    ASSESSMENT:   58 yo female admitted with fatigue, decreased appetite, fevers, cough, diarrhea x 11 days. COVID test positive. PMH includes anemia, osteoarthritis, breast cancer, frequent PVCs, CHF, cardiomyopathy, hyperthyroidism.   Labs reviewed. Potassium 3.3 (L)  Medications reviewed and include vitamin D, solu-medrol, prednisone, vitamin C.   Patient reported recent poor appetite and poor intake with weight loss PTA. From review of weight encounters, patient with 7% weight loss over the past month. Some of the weight fluctuation could be related to fluid shifts with CHF.  NUTRITION - FOCUSED PHYSICAL EXAM:  deferred  Diet Order:   Diet Order            Diet Heart Room service appropriate? Yes; Fluid consistency: Thin  Diet effective now              EDUCATION NEEDS:   Not appropriate for education at this time  Skin:  Skin Assessment: Reviewed RN Assessment  Last BM:  10/14 type 6  Height:   Ht Readings from Last 1 Encounters:  06/06/19 5\' 4"  (1.626 m)    Weight:   Wt Readings from Last 1 Encounters:  06/06/19 63.8 kg    Ideal Body Weight:  54.5 kg  BMI:  Body mass index is 24.14 kg/m.  Estimated Nutritional Needs:   Kcal:   1800-2000  Protein:  90-100 gm  Fluid:  1.6-1.8 L    Molli Barrows, RD, LDN, [place] Pager [phone] After Hours Pager [phone]

## 2019-06-07 LAB — COMPREHENSIVE METABOLIC PANEL
ALT: 27 U/L (ref 0–44)
AST: 24 U/L (ref 15–41)
Albumin: 3.3 g/dL — ABNORMAL LOW (ref 3.5–5.0)
Alkaline Phosphatase: 94 U/L (ref 38–126)
Anion gap: 9 (ref 5–15)
BUN: 16 mg/dL (ref 6–20)
CO2: 24 mmol/L (ref 22–32)
Calcium: 8.8 mg/dL — ABNORMAL LOW (ref 8.9–10.3)
Chloride: 103 mmol/L (ref 98–111)
Creatinine, Ser: 0.66 mg/dL (ref 0.44–1.00)
GFR calc Af Amer: >60 mL/min (ref >60)
GFR calc non Af Amer: >60 mL/min (ref >60)
Glucose, Bld: 143 mg/dL — ABNORMAL HIGH (ref 70–99)
Potassium: 4.1 mmol/L (ref 3.5–5.1)
Sodium: 136 mmol/L (ref 135–145)
Total Bilirubin: 0.9 mg/dL (ref 0.3–1.2)
Total Protein: 5.9 g/dL — ABNORMAL LOW (ref 6.5–8.1)

## 2019-06-07 LAB — CBC WITH DIFFERENTIAL/PLATELET
Abs Immature Granulocytes: 0.07 10*3/uL (ref 0.00–0.07)
Basophils Absolute: 0 10*3/uL (ref 0.0–0.1)
Basophils Relative: 0 %
Eosinophils Absolute: 0 10*3/uL (ref 0.0–0.5)
Eosinophils Relative: 0 %
HCT: 39.1 % (ref 36.0–46.0)
Hemoglobin: 12.5 g/dL (ref 12.0–15.0)
Immature Granulocytes: 0 %
Lymphocytes Relative: 4 %
Lymphs Abs: 0.6 10*3/uL — ABNORMAL LOW (ref 0.7–4.0)
MCH: 30 pg (ref 26.0–34.0)
MCHC: 32 g/dL (ref 30.0–36.0)
MCV: 93.8 fL (ref 80.0–100.0)
Monocytes Absolute: 0.5 10*3/uL (ref 0.1–1.0)
Monocytes Relative: 3 %
Neutro Abs: 14.5 10*3/uL — ABNORMAL HIGH (ref 1.7–7.7)
Neutrophils Relative %: 93 %
Platelets: 343 10*3/uL (ref 150–400)
RBC: 4.17 MIL/uL (ref 3.87–5.11)
RDW: 13.5 % (ref 11.5–15.5)
WBC: 15.6 10*3/uL — ABNORMAL HIGH (ref 4.0–10.5)
nRBC: 0 % (ref 0.0–0.2)

## 2019-06-07 LAB — FERRITIN: Ferritin: 18 ng/mL (ref 11–307)

## 2019-06-07 LAB — D-DIMER, QUANTITATIVE: D-Dimer, Quant: 0.44 ug/mL-FEU (ref 0.00–0.50)

## 2019-06-07 LAB — LACTATE DEHYDROGENASE: LDH: 188 U/L (ref 98–192)

## 2019-06-07 LAB — MAGNESIUM: Magnesium: 2.4 mg/dL (ref 1.7–2.4)

## 2019-06-07 LAB — T3: T3, Total: 81 ng/dL (ref 71–180)

## 2019-06-07 LAB — C-REACTIVE PROTEIN: CRP: <0.8 mg/dL (ref <1.0)

## 2019-06-07 LAB — BRAIN NATRIURETIC PEPTIDE: B Natriuretic Peptide: 1294.9 pg/mL — ABNORMAL HIGH (ref 0.0–100.0)

## 2019-06-07 MED ORDER — HYDROCOD POLST-CPM POLST ER 10-8 MG/5ML PO SUER
5.0000 mL | Freq: Two times a day (BID) | ORAL | Status: DC | PRN
  Administered 2019-06-07 – 2019-06-08 (×2): 5 mL via ORAL
  Filled 2019-06-07 (×2): qty 5

## 2019-06-07 MED ORDER — METHYLPREDNISOLONE SODIUM SUCC 125 MG IJ SOLR
60.0000 mg | Freq: Every day | INTRAMUSCULAR | Status: DC
  Administered 2019-06-07: 60 mg via INTRAVENOUS
  Filled 2019-06-07: qty 2

## 2019-06-07 MED ORDER — LACTATED RINGERS IV SOLN
INTRAVENOUS | Status: DC
  Administered 2019-06-07: 10:00:00 via INTRAVENOUS

## 2019-06-07 MED ORDER — LACTATED RINGERS IV SOLN
INTRAVENOUS | Status: DC
  Administered 2019-06-07: 07:00:00 via INTRAVENOUS

## 2019-06-07 MED ORDER — METOPROLOL TARTRATE 50 MG PO TABS
50.0000 mg | ORAL_TABLET | Freq: Two times a day (BID) | ORAL | Status: DC
  Administered 2019-06-07: 50 mg via ORAL
  Filled 2019-06-07 (×2): qty 1

## 2019-06-07 MED ORDER — METOPROLOL TARTRATE 25 MG PO TABS
25.0000 mg | ORAL_TABLET | Freq: Two times a day (BID) | ORAL | Status: DC
  Filled 2019-06-07: qty 1

## 2019-06-07 MED ORDER — METOPROLOL TARTRATE 50 MG PO TABS
50.0000 mg | ORAL_TABLET | Freq: Once | ORAL | Status: AC
  Administered 2019-06-07: 50 mg via ORAL

## 2019-06-07 NOTE — Progress Notes (Signed)
PROGRESS NOTE                                                                                                                                                                                                             Patient Demographics:    Megan Keith, is a 58 y.o. female, DOB - 1961/07/22, [id number]  Outpatient Primary MD for the patient is Aldona Bar, MD    LOS - 1  Admit date - 06/06/2019    CC -      Brief Narrative - Megan Keith is a 58 y.o. 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  She also had some diarrhea followed by some palpitations after which she arrived to [facility] ER where she was found to be hypotensive, in A. fib with RVR and had COVID positive test subsequently cardiology was consulted over the phone and she was cleared for [facility] admission.   Subjective:   Patient in bed, appears comfortable, denies any headache, no fever, no chest pain or pressure, no shortness of breath , no abdominal pain. No focal weakness.   Assessment  & Plan :     1. Acute Covid 19 Viral gastroenteritis and pneumonitis during the ongoing 2020 Covid 19 Pandemic - she did come quite dehydrated and hypotensive, continue hydration with IV fluids, blood pressure stabilizing somewhat but will give another 500 cc bolus on 06/07/2019.  Continue supportive care, have placed her on IV steroids for now.  Continues to have no pulmonary symptoms with stable inflammatory markers.    COVID-19 Labs  Recent Labs     06/06/19 0620 06/07/19 0105  DDIMER 0.54* 0.44  FERRITIN 20 18  LDH  --  188  CRP <0.8 <0.8    Lab Results  Component Value Date   SARSCOV2NAA NEGATIVE 03/05/2019     SpO2: 100 % O2 Flow Rate (L/min): 2 L/min  Hepatic Function Latest Ref Rng & Units 06/07/2019 06/06/2019 05/07/2019  Total Protein 6.5 - 8.1 g/dL 5.9(L) 6.0(L) 6.7  Albumin 3.5 -  5.0 g/dL 3.3(L) 3.4(L) 4.0  AST 15 - 41 U/L _0 ALT 0 - 44 U/L _1 Alk Phosphatase 38 - 126 U/L 94 105 105  Total Bilirubin 0.3 - 1.2 mg/dL 0.9 0.4 0.3  Bilirubin, Direct 0.0 - 0.2 mg/dL - - -        Component Value Date/Time   BNP 1,294.9 (H) 06/07/2019 0105      2.  Chronic systolic CHF EF 01%.  Currently appears compensated.  Blood pressure too low, at this time low-dose beta-blocker if tolerated by blood pressure.  Hold any ACE/ARB, Aldactone due to hypotension for now.  Elevated BNP likely due to RVR.  Lungs are clear with no shortness of breath we will continue to monitor clinically.  Case discussed with her cardiologist Dr. Sung Amabile on 06/06/2019.  3.  Paroxysmal atrial fibrillation currently in RVR.  Mali vas 2 score greater than 3.  Continue Eliquis, she was loaded with IV digoxin and now on lowest dose of oral digoxin with close monitoring, IV amnio drip held, will discontinue for good if heart rate stays below 100, beta-blocker extra dose today and continue at a higher dose if blood pressure allows.  Once she is discharged follow-up with Dr. Sung Amabile, Dr. Sung Amabile to decide if patient will benefit from an AICD device in the long-term.  4.  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  TSH is stable.  On methimazole and prednisone at home, currently on IV steroids due to hypotension will be continued along with home dose methimazole.  5.  GERD.  On PPI.  6. HX of breast cancer.  On Arimidex, outpatient follow-up.      Condition - Extremely Guarded  Family Communication  : None  Code  Status :  Full  Diet :   Diet Order            Diet Heart Room service appropriate? Yes; Fluid consistency: Thin  Diet effective now               Disposition Plan  : Inpt  Consults  :  Cards Dr Sung Amabile by me on 06/06/2019  Procedures  :    TTE 03/2019 -      1. The left ventricle has severely reduced systolic function, with an ejection fraction of 25-30%. The cavity size was normal. Left ventricular diastolic function could not be evaluated secondary to atrial fibrillation. Left ventricular diffuse  hypokinesis.  2. The average left ventricular global longitudinal strain is -9.6 %.  3. The right ventricle has normal systolic function. The cavity was normal. There is no increase in right ventricular wall thickness.  4. Left atrial size was severely dilated.  5. There is mild to moderate mitral annular calcification present.  6. The aorta is normal in size and structure.   PUD Prophylaxis :  PPI  DVT Prophylaxis  :  Eliquis  Lab Results  Component Value Date   PLT 343 06/07/2019    Inpatient Medications  Scheduled Meds: . anastrozole  1 mg Oral Daily  . apixaban  5 mg Oral BID  . cholecalciferol  1,000 Units Oral Daily  . digoxin  0.125 mg Oral Daily  . feeding supplement (ENSURE ENLIVE)  237 mL Oral TID BM  . methimazole  10 mg Oral BID  . methylPREDNISolone (SOLU-MEDROL) injection  60 mg Intravenous Daily  . metoprolol tartrate  50 mg Oral BID  . metoprolol tartrate  50 mg Oral Once  .  pantoprazole  40 mg Oral Daily  . [START ON 06/09/2019] predniSONE  10 mg Oral Q breakfast  . vitamin C  1,000 mg Oral Daily   Continuous Infusions: . lactated ringers     PRN Meds:.acetaminophen, ALPRAZolam, prochlorperazine  Antibiotics  :    Anti-infectives (From admission, onward)   None       Time Spent in minutes  30   Lala Lund M.D on 06/07/2019 at 9:36 AM  To page go to [email or web address] - password [facility]  [facility] -  Office  [phone]    See all Orders from today for further details    Objective:   Vitals:   06/06/19 1800 06/06/19 1943 06/06/19 2332 06/07/19 0415  BP:  101/67  (!) 85/66  Pulse: (!) 137 98 75 76  Resp: (!) _0 Temp:  97.6 F (36.4 C)  98 F (36.7 C)  TempSrc:  Oral  Oral  SpO2: 99% 99% 98% 100%  Weight:      Height:        Wt Readings from Last 3 Encounters:  06/06/19 63.8 kg  05/16/19 66.2 kg  05/07/19 68.8 kg     Intake/Output Summary (Last 24 hours) at 06/07/2019 0936 Last data filed at 06/07/2019 0400 Gross per 24 hour  Intake 2171.62 ml  Output 500 ml  Net 1671.62 ml     Physical Exam  Awake Alert, Oriented X 3, No new F.N deficits, Normal affect [place].AT,PERRAL Supple Neck,No JVD, No cervical lymphadenopathy appriciated.  Symmetrical Chest wall movement, Good air movement bilaterally, CTAB, no rales iRRR,No Gallops, Rubs or new Murmurs, No Parasternal Heave +ve B.Sounds, Abd Soft, No tenderness, No organomegaly appriciated, No rebound - guarding or rigidity. No Cyanosis, Clubbing or edema, No new Rash or bruise     Data Review:    CBC Recent Labs  Lab 06/06/19 0620 06/07/19 0105  WBC 11.2* 15.6*  HGB 12.8 12.5  HCT 39.4 39.1  PLT 324 343  MCV 92.7 93.8  MCH 30.1 30.0  MCHC 32.5 32.0  RDW 13.5 13.5  LYMPHSABS 0.9 0.6*  MONOABS 0.6 0.5  EOSABS 0.0 0.0  BASOSABS 0.0 0.0    Chemistries  Recent Labs  Lab 06/06/19 0620 06/07/19 0105  NA 138 136  K 3.3* 4.1  CL 103 103  CO2 24 24  GLUCOSE 133* 143*  BUN 13 16  CREATININE 0.67 0.66  CALCIUM 8.7* 8.8*  MG 2.2 2.4  AST 23 24  ALT 25 27  ALKPHOS 105 94  BILITOT 0.4 0.9   ------------------------------------------------------------------------------------------------------------------ No results for input(s): CHOL, HDL, LDLCALC, TRIG, CHOLHDL, LDLDIRECT in the last 72 hours.  Lab Results  Component Value Date   HGBA1C 5.6 03/06/2019    ------------------------------------------------------------------------------------------------------------------ Recent Labs    06/06/19 0620  TSH 0.393    Cardiac Enzymes No results for input(s): CKMB, TROPONINI, MYOGLOBIN in the last 168 hours.  Invalid input(s): CK ------------------------------------------------------------------------------------------------------------------    Component Value Date/Time   BNP 1,294.9 (H) 06/07/2019 0105    Micro Results No results found for this or any previous visit (from the past 240 hour(s)).  Radiology Reports Dg Chest Port 1 View  Result Date: 06/06/2019 CLINICAL DATA:  Shortness of breath. EXAM: PORTABLE CHEST 1 VIEW COMPARISON:  06/05/2019 and 04/06/2019 FINDINGS: Stable enlargement of the cardiac silhouette. There are new patchy densities in the right lower lung which are concerning for airspace disease. Left lung is clear without significant airspace disease or edema. No evidence for  large pleural effusions. Bone structures are grossly intact. IMPRESSION: 1. New patchy densities in the right lower chest. Findings are concerning for a subtle area of infection or inflammation. 2. Stable cardiomegaly. Electronically Signed   By: Markus Daft M.D.   On: 06/06/2019 12:49

## 2019-06-07 NOTE — TOC Initial Note (Signed)
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 This week patient also had some diarrhea followed by some palpitations after which she arrived to [facility] ER where she was found to be hypotensive, in A. fib with RVR and had COVID positive test subsequently cardiology was consulted over the phone and she was cleared for transfer to [place] for further treatment. Case manager has scheduled telephonic hospital follow up appt with [facility] on Tuesday, June 26, 2019 at 10:50am. Appt added to AVS. Case manager will continue to monitor for needs.      Expected Discharge Plan: Home/Self Care Barriers to Discharge: No Barriers Identified   Patient Goals and CMS Choice        Expected Discharge Plan and Services Expected Discharge Plan: Home/Self Care                                              Prior Living Arrangements/Services                       Activities of Daily Living Home Assistive Devices/Equipment: None ADL Screening (condition at time of admission) Patient's cognitive ability adequate to safely complete daily activities?: Yes Is the patient deaf or have difficulty hearing?: No Does the patient have difficulty seeing, even when wearing glasses/contacts?: No Does the patient have difficulty concentrating, remembering, or making decisions?: No Patient able to express need for assistance  with ADLs?: Yes Does the patient have difficulty dressing or bathing?: No Independently performs ADLs?: Yes (appropriate for developmental age) Does the patient have difficulty walking or climbing stairs?: No Weakness of Legs: None Weakness of Arms/Hands: None  Permission Sought/Granted                  Emotional Assessment              Admission diagnosis:  COVID-19 [U07.1] A-fib [facility]) [I48.91] Patient Active Problem List   Diagnosis Date Noted  . COVID-19 06/06/2019  . Chronic systolic CHF (congestive heart failure) ([place])   . Atrial fibrillation with RVR ([place]) 03/05/2019  . Hypotension 03/05/2019  . Hyperthyroidism 03/05/2019  . GERD (gastroesophageal reflux disease) 02/05/2018  . Breast cancer ([place]) 02/05/2018  . Headache 02/05/2018  . Nausea & vomiting 02/05/2018  . Epigastric abdominal pain 02/05/2018  . Sore throat 02/05/2018  . Frequent PVCs   . DCM (dilated cardiomyopathy) ([place])   . Acute on chronic HFrEF (heart failure with reduced ejection fraction) ([place]) 04/15/2017  . 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 ([place]) 04/15/2017   PCP:  Aldona Bar, MD Pharmacy:   [facility], [place] [address] Phone: [phone] Fax: [place], [place] - [address] [facility]. [address] [place] [address] Phone: [phone] Fax: [phone]  [facility] -  [place], [place] - [address] [place] [address] Phone: [phone] Fax: [phone]     Social Determinants of Health (Megan Keith) Interventions    Readmission Risk Interventions No flowsheet data found.

## 2019-06-07 NOTE — Progress Notes (Signed)
Occupational Therapy Treatment Patient Details Name: Megan Keith MRN: [id number] DOB: [birth year] Today's Date: 06/07/2019    History of present illness 58 y/o female w/ hx of episodic medical noncompliance, anemia, OA, breast cancer, frequent PVCs, L venttricular thrombus w/o MI, HTN, PNA, hyperthyroidism,  CHF, cardiomyopathy, a-fib w/ RVR. Presents to hospital w/ c/o weakness, fatigue, low grade fever, chills, night sweats, non productive cough, dyspnea, palpitations, diarrhea.   OT comments  RN requesting assistance with locating recliner (as pt without recliner in room initially) and transfer OOB to chair. Pt agreeable and appearing happy to be OOB. Pt performing transitions without AD at overall supervision level. SpO2 >90% on 2L, max HR 116 with activity. Will continue per POC.    Follow Up Recommendations  No OT follow up;Supervision/Assistance - 24 hour    Equipment Recommendations  Tub/shower seat          Precautions / Restrictions Precautions Precautions: Fall Restrictions Weight Bearing Restrictions: No       Mobility Bed Mobility Overal bed mobility: Modified Independent                Transfers Overall transfer level: Needs assistance Equipment used: None Transfers: Sit to/from Stand;Stand Pivot Transfers Sit to Stand: Supervision Stand pivot transfers: Supervision       General transfer comment: for safety and line management    Balance Overall balance assessment: No apparent balance deficits (not formally assessed)                                         ADL either performed or assessed with clinical judgement   ADL Overall ADL's : Needs assistance/impaired Eating/Feeding: Modified independent;Sitting   Grooming: Oral care;Supervision/safety;Standing Grooming Details (indicate cue type and reason): standing at sink in room Upper Body Bathing: Sitting;Modified independent   Lower Body Bathing: Min guard;Sit  to/from stand   Upper Body Dressing : Modified independent;Sitting   Lower Body Dressing: Min guard;Sit to/from stand   Toilet Transfer: Supervision/safety;Ambulation Toilet Transfer Details (indicate cue type and reason): simulated via transfer to/from EOB and recliner Toileting- Clothing Manipulation and Hygiene: Supervision/safety;Sit to/from stand       Functional mobility during ADLs: Supervision/safety General ADL Comments: RN request assist for pt OOB to recliner (per MD request); assisted with transfer OOB, pt agreeable and happy to be OOB                       Cognition Arousal/Alertness: Awake/alert Behavior During Therapy: WFL for tasks assessed/performed Overall Cognitive Status: Within Functional Limits for tasks assessed                                 General Comments: for basic tasks assessed, difficult to fully assess as pt non english speaking        Exercises     Shoulder Instructions       General Comments max HR noted 116 with transitions, SpO2 >90% on 2L throughout    Pertinent Vitals/ Pain       Pain Assessment: No/denies pain  Home Living Family/patient expects to be discharged to:: Private residence Living Arrangements: Children Available Help at Discharge: Family Type of Home: Mobile home Home Access: Stairs to enter Entrance Stairs-Number of Steps: 4 Entrance Stairs-Rails: Can reach both Home Layout: One level  Bathroom Shower/Tub: Teacher, early years/pre: Standard         Additional Comments: denies need for AD at home and states grandchildren will help out      Prior Functioning/Environment Level of Independence: Independent        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)   Frequency  Min 2X/week        Progress Toward Goals  OT Goals(current goals can now be found in the care plan section)  Progress towards OT goals: Progressing toward goals  Acute Rehab OT  Goals Patient Stated Goal: none stated, agreeable to working with therapy OT Goal Formulation: With patient Time For Goal Achievement: 06/21/19 Potential to Achieve Goals: Good  Plan Discharge plan remains appropriate    Co-evaluation                 AM-PAC OT "6 Clicks" Daily Activity     Outcome Measure   Help from another person eating meals?: None Help from another person taking care of personal grooming?: None Help from another person toileting, which includes using toliet, bedpan, or urinal?: A Little Help from another person bathing (including washing, rinsing, drying)?: A Little Help from another person to put on and taking off regular upper body clothing?: None Help from another person to put on and taking off regular lower body clothing?: A Little 6 Click Score: 21    End of Session Equipment Utilized During Treatment: Oxygen(2L)  OT Visit Diagnosis: Other (comment)(decreased activity tolerance)   Activity Tolerance Patient tolerated treatment well   Patient Left in chair;with call bell/phone within reach   Nurse Communication Mobility status        Time: [id number] OT Time Calculation (min): 11 min  Charges: OT General Charges $OT Visit: 1 Visit OT Evaluation $OT Eval Moderate Complexity: 1 Mod OT Treatments $Self Care/Home Management : 8-22 mins $Therapeutic Activity: 8-22 mins  Lou Cal, OT Supplemental Rehabilitation Services Pager [phone] Office [place] 06/07/2019, 1:39 PM

## 2019-06-07 NOTE — Evaluation (Addendum)
Occupational Therapy Evaluation Patient Details Name: Megan Keith MRN: [id number] DOB: [birth year] Today's Date: 06/07/2019    History of Present Illness 58 y/o female w/ hx of episodic medical noncompliance, anemia, OA, breast cancer, frequent PVCs, L venttricular thrombus w/o MI, HTN, PNA, hyperthyroidism,  CHF, cardiomyopathy, a-fib w/ RVR. Presents to hospital w/ c/o weakness, fatigue, low grade fever, chills, night sweats, non productive cough, dyspnea, palpitations, diarrhea.   Clinical Impression   This 58 y/o female presents with the above. PTA pt reports independence with ADL and functional mobility, reports she lives with her two daughters (21 and 58 y/o). Pt performing room level functional mobility without AD at supervision level during session; completing standing grooming ADL with supervision and demonstrating LB ADL at [facility] assist level. Pt with soft BP start of session (92/77 seated EOB), upon initial stand BP 84/66 with pt asymptomatic and denies dizziness/lightheadedness, post standing activity seated EOB BP 103/77. Pt also with elevated HR during standing activity (max HR briefly 133, returning to low 100s end of session); pt on 2L O2 throughout with SpO2 >92%. She will benefit from continued acute OT services to maximize her safety and independence with ADL and mobility. Will follow.     Follow Up Recommendations  No OT follow up;Supervision/Assistance - 24 hour    Equipment Recommendations  Tub/shower seat           Precautions / Restrictions Precautions Precautions: Fall Restrictions Weight Bearing Restrictions: No      Mobility Bed Mobility Overal bed mobility: Modified Independent                Transfers Overall transfer level: Needs assistance Equipment used: None Transfers: Sit to/from Stand;Stand Pivot Transfers Sit to Stand: Supervision         General transfer comment: for safety with lines    Balance Overall balance  assessment: No apparent balance deficits (not formally assessed)                                         ADL either performed or assessed with clinical judgement   ADL Overall ADL's : Needs assistance/impaired Eating/Feeding: Modified independent;Sitting   Grooming: Oral care;Supervision/safety;Standing Grooming Details (indicate cue type and reason): standing at sink in room Upper Body Bathing: Sitting;Modified independent   Lower Body Bathing: Min guard;Sit to/from stand   Upper Body Dressing : Modified independent;Sitting   Lower Body Dressing: Min guard;Sit to/from stand   Toilet Transfer: Supervision/safety;Ambulation Toilet Transfer Details (indicate cue type and reason): simulated via transfer to/from EOB and recliner Toileting- Clothing Manipulation and Hygiene: Supervision/safety;Sit to/from stand       Functional mobility during ADLs: Supervision/safety                           Pertinent Vitals/Pain Pain Assessment: No/denies pain     Hand Dominance     Extremity/Trunk Assessment Upper Extremity Assessment Upper Extremity Assessment: Overall WFL for tasks assessed   Lower Extremity Assessment Lower Extremity Assessment: Defer to PT evaluation   Cervical / Trunk Assessment Cervical / Trunk Assessment: Normal   Communication Communication Communication: Interpreter utilized(Stratus, Mickel Baas [phone])   Cognition Arousal/Alertness: Awake/alert Behavior During Therapy: WFL for tasks assessed/performed Overall Cognitive Status: Within Functional Limits for tasks assessed  General Comments: for basic tasks assessed, difficult to fully assess as pt non english speaking   General Comments  pt with elevated HR during standing activity (max noted briefly 133); received verbal okay from MD prior to session for pt to participate in activity while mildly tachy; HR back to low 100s seated EOB end of  session    Exercises     Shoulder Instructions      Home Living Family/patient expects to be discharged to:: Private residence Living Arrangements: Children Available Help at Discharge: Family Type of Home: Mobile home Home Access: Stairs to enter Technical brewer of Steps: 4 Entrance Stairs-Rails: Can reach both Home Layout: One level     Bathroom Shower/Tub: Teacher, early years/pre: Standard         Additional Comments: denies need for AD at home and states grandchildren will help out      Prior Functioning/Environment Level of Independence: Independent        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)        OT Problem List: Decreased strength;Decreased activity tolerance;Decreased knowledge of use of DME or AE;Cardiopulmonary status limiting activity      OT Treatment/Interventions: Self-care/ADL training;Therapeutic exercise;Energy conservation;DME and/or AE instruction;Therapeutic activities;Patient/family education;Balance training    OT Goals(Current goals can be found in the care plan section) Acute Rehab OT Goals Patient Stated Goal: none stated, agreeable to working with therapy OT Goal Formulation: With patient Time For Goal Achievement: 06/21/19 Potential to Achieve Goals: Good  OT Frequency: Min 2X/week   Barriers to D/C:            Co-evaluation              AM-PAC OT "6 Clicks" Daily Activity     Outcome Measure Help from another person eating meals?: None Help from another person taking care of personal grooming?: None Help from another person toileting, which includes using toliet, bedpan, or urinal?: A Little Help from another person bathing (including washing, rinsing, drying)?: A Little Help from another person to put on and taking off regular upper body clothing?: None Help from another person to put on and taking off regular lower body clothing?: A Little 6 Click Score: 21   End of Session  Equipment Utilized During Treatment: Oxygen(2L) Nurse Communication: Mobility status  Activity Tolerance: Patient tolerated treatment well Patient left: with call bell/phone within reach(seated EOB)  OT Visit Diagnosis: Other (comment)(decreased activity tolerance)                Time: [id number] OT Time Calculation (min): 29 min Charges:  OT General Charges $OT Visit: 1 Visit OT Evaluation $OT Eval Moderate Complexity: 1 Mod OT Treatments $Self Care/Home Management : 8-22 mins  Lou Cal, OT Supplemental Rehabilitation Services Pager [phone] Office [phone]   Raymondo Band 06/07/2019, 1:32 PM

## 2019-06-08 LAB — COMPREHENSIVE METABOLIC PANEL
ALT: 62 U/L — ABNORMAL HIGH (ref 0–44)
AST: 86 U/L — ABNORMAL HIGH (ref 15–41)
Albumin: 3.1 g/dL — ABNORMAL LOW (ref 3.5–5.0)
Alkaline Phosphatase: 86 U/L (ref 38–126)
Anion gap: 7 (ref 5–15)
BUN: 22 mg/dL — ABNORMAL HIGH (ref 6–20)
CO2: 25 mmol/L (ref 22–32)
Calcium: 8.6 mg/dL — ABNORMAL LOW (ref 8.9–10.3)
Chloride: 104 mmol/L (ref 98–111)
Creatinine, Ser: 0.54 mg/dL (ref 0.44–1.00)
GFR calc Af Amer: >60 mL/min (ref >60)
GFR calc non Af Amer: >60 mL/min (ref >60)
Glucose, Bld: 127 mg/dL — ABNORMAL HIGH (ref 70–99)
Potassium: 4.7 mmol/L (ref 3.5–5.1)
Sodium: 136 mmol/L (ref 135–145)
Total Bilirubin: 0.8 mg/dL (ref 0.3–1.2)
Total Protein: 5.4 g/dL — ABNORMAL LOW (ref 6.5–8.1)

## 2019-06-08 LAB — CBC WITH DIFFERENTIAL/PLATELET
Abs Immature Granulocytes: 0.08 10*3/uL — ABNORMAL HIGH (ref 0.00–0.07)
Basophils Absolute: 0 10*3/uL (ref 0.0–0.1)
Basophils Relative: 0 %
Eosinophils Absolute: 0 10*3/uL (ref 0.0–0.5)
Eosinophils Relative: 0 %
HCT: 37.4 % (ref 36.0–46.0)
Hemoglobin: 12 g/dL (ref 12.0–15.0)
Immature Granulocytes: 1 %
Lymphocytes Relative: 5 %
Lymphs Abs: 0.7 10*3/uL (ref 0.7–4.0)
MCH: 30.4 pg (ref 26.0–34.0)
MCHC: 32.1 g/dL (ref 30.0–36.0)
MCV: 94.7 fL (ref 80.0–100.0)
Monocytes Absolute: 0.5 10*3/uL (ref 0.1–1.0)
Monocytes Relative: 4 %
Neutro Abs: 12.5 10*3/uL — ABNORMAL HIGH (ref 1.7–7.7)
Neutrophils Relative %: 90 %
Platelets: 333 10*3/uL (ref 150–400)
RBC: 3.95 MIL/uL (ref 3.87–5.11)
RDW: 13.7 % (ref 11.5–15.5)
WBC: 13.7 10*3/uL — ABNORMAL HIGH (ref 4.0–10.5)
nRBC: 0 % (ref 0.0–0.2)

## 2019-06-08 LAB — MAGNESIUM: Magnesium: 2.4 mg/dL (ref 1.7–2.4)

## 2019-06-08 LAB — C-REACTIVE PROTEIN: CRP: <0.8 mg/dL (ref <1.0)

## 2019-06-08 LAB — LACTATE DEHYDROGENASE: LDH: 207 U/L — ABNORMAL HIGH (ref 98–192)

## 2019-06-08 LAB — D-DIMER, QUANTITATIVE: D-Dimer, Quant: 0.46 ug/mL-FEU (ref 0.00–0.50)

## 2019-06-08 LAB — FERRITIN: Ferritin: 15 ng/mL (ref 11–307)

## 2019-06-08 LAB — BRAIN NATRIURETIC PEPTIDE: B Natriuretic Peptide: 893.6 pg/mL — ABNORMAL HIGH (ref 0.0–100.0)

## 2019-06-08 MED ORDER — CEPASTAT 14.5 MG MT LOZG
1.0000 | LOZENGE | Freq: Two times a day (BID) | OROMUCOSAL | Status: DC
  Administered 2019-06-08 – 2019-06-09 (×2): 1 via BUCCAL
  Filled 2019-06-08: qty 9

## 2019-06-08 MED ORDER — METOPROLOL TARTRATE 25 MG PO TABS
25.0000 mg | ORAL_TABLET | Freq: Two times a day (BID) | ORAL | Status: DC
  Filled 2019-06-08: qty 1

## 2019-06-08 MED ORDER — LACTATED RINGERS IV SOLN
INTRAVENOUS | Status: AC
  Administered 2019-06-08: 16:00:00 via INTRAVENOUS

## 2019-06-08 MED ORDER — SODIUM CHLORIDE 0.9 % IV BOLUS
500.0000 mL | Freq: Once | INTRAVENOUS | Status: AC
  Administered 2019-06-08: 500 mL via INTRAVENOUS

## 2019-06-08 MED ORDER — METHYLPREDNISOLONE SODIUM SUCC 40 MG IJ SOLR
40.0000 mg | Freq: Every day | INTRAMUSCULAR | Status: AC
  Administered 2019-06-08: 40 mg via INTRAVENOUS
  Filled 2019-06-08: qty 1

## 2019-06-08 MED ORDER — METOPROLOL TARTRATE 5 MG/5ML IV SOLN
5.0000 mg | Freq: Four times a day (QID) | INTRAVENOUS | Status: DC | PRN
  Administered 2019-06-08: 5 mg via INTRAVENOUS

## 2019-06-08 MED ORDER — METOPROLOL TARTRATE 25 MG PO TABS
25.0000 mg | ORAL_TABLET | Freq: Two times a day (BID) | ORAL | Status: DC
  Administered 2019-06-08: 25 mg via ORAL
  Filled 2019-06-08 (×2): qty 1

## 2019-06-08 NOTE — Progress Notes (Signed)
PROGRESS NOTE                                                                                                                                                                                                             Patient Demographics:    Megan Keith, is a 58 y.o. female, DOB - 1961-07-22, [id number]  Outpatient Primary MD for the patient is Megan Bar, MD    LOS - 2  Admit date - 06/06/2019    CC -      Brief Narrative - Megan Keith is a 58 y.o. 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  She also had some diarrhea followed by some palpitations after which she arrived to [facility] ER where she was found to be hypotensive, in A. fib with RVR and had COVID positive test subsequently cardiology was consulted over the phone and she was cleared for [facility] admission.   Subjective:   Patient in bed, appears comfortable, denies any headache, no fever, no chest pain or pressure, no shortness of breath , no abdominal pain. No focal weakness.   Assessment  & Plan :     1. Acute Covid 19 Viral gastroenteritis and mild pneumonitis during the ongoing 2020 Covid 19 Pandemic - she did come quite dehydrated and hypotensive, has been adequately hydrated with IV fluids.  Continue supportive care, have placed her on IV steroids for now.  Continues to have no pulmonary symptoms with stable inflammatory markers.  Advance activity prepare for discharge in the next 1 to 2 days.    COVID-19 Labs  Recent Labs   06/06/19 0620 06/07/19 0105 06/08/19 0044  DDIMER 0.54* 0.44 0.46  FERRITIN 20 18 15   LDH  --  188 207*  CRP <0.8 <0.8 <0.8    Lab Results  Component Value Date   SARSCOV2NAA NEGATIVE 03/05/2019     SpO2: 99 % O2 Flow Rate (L/min): 3 L/min  Hepatic Function Latest Ref Rng & Units 06/08/2019 06/07/2019 06/06/2019  Total Protein 6.5 - 8.1 g/dL  5.4(L) 5.9(L) 6.0(L)  Albumin 3.5 - 5.0 g/dL 3.1(L) 3.3(L) 3.4(L)  AST 15 - 41 U/L 86(H) 24 23  ALT 0 - 44 U/L 62(H) 27 25  Alk Phosphatase 38 - 126 U/L 86 94 105  Total Bilirubin 0.3 - 1.2 mg/dL 0.8 0.9 0.4  Bilirubin, Direct 0.0 - 0.2 mg/dL - - -        Component Value Date/Time   BNP 893.6 (H) 06/08/2019 0044      2.  Chronic systolic CHF EF 64%.  Currently appears compensated.  Blood pressure too low, at this time low-dose beta-blocker if tolerated by blood pressure.  Hold any ACE/ARB, Aldactone due to hypotension for now.  Elevated BNP likely due to RVR.  Lungs are clear with no shortness of breath we will continue to monitor clinically.  Case discussed with her cardiologist Dr. Sung Keith on 06/06/2019.  3.  Paroxysmal atrial fibrillation currently in RVR.  Mali vas 2 score greater than 3.  Continue Eliquis, she was loaded with IV amiodarone along with IV digoxin, currently on oral digoxin and oral beta-blocker combination with good rate control, low blood pressure was a challenge which has not improved after hydration with IV fluids.  Once she is discharged follow-up with Dr. Sung Keith, Dr. Sung Keith to decide if patient will benefit from an AICD device in the long-term.  4.  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  TSH is stable.  On methimazole and prednisone at home, currently on IV steroids due to hypotension will be continued along with home dose methimazole.  Will require outpatient follow-up with PCP for TSH, free T4 and T3 monitoring.  [name].  5.  GERD.  On PPI.  6. HX of breast cancer.  On Arimidex,  outpatient follow-up.      Condition - Extremely Guarded  Family Communication  : None  Code Status :  Full  Diet :   Diet Order            Diet Heart Room service appropriate? Yes; Fluid consistency: Thin  Diet effective now               Disposition Plan  : Inpt  Consults  :  Cards Dr Megan Keith by me on 06/06/2019  Procedures  :    TTE 03/2019 -      1. The left ventricle has severely reduced systolic function, with an ejection fraction of 25-30%. The cavity size was normal. Left ventricular diastolic function could not be evaluated secondary to atrial fibrillation. Left ventricular diffuse  hypokinesis.  2. The average left ventricular global longitudinal strain is -9.6 %.  3. The right ventricle has normal systolic function. The cavity was normal. There is no increase in right ventricular wall thickness.  4. Left atrial size was severely dilated.  5. There is mild to moderate mitral annular calcification present.  6. The aorta is normal in size and structure.   PUD Prophylaxis :  PPI  DVT Prophylaxis  :  Eliquis  Lab Results  Component Value Date   PLT 333 06/08/2019    Inpatient Medications  Scheduled Meds: . anastrozole  1 mg Oral Daily  . apixaban  5 mg Oral BID  . cholecalciferol  1,000 Units Oral Daily  . digoxin  0.125 mg Oral Daily  . feeding supplement (ENSURE ENLIVE)  237 mL Oral TID BM  . methimazole  10 mg Oral BID  . methylPREDNISolone (SOLU-MEDROL) injection  40 mg Intravenous Daily  . metoprolol tartrate  50 mg Oral BID  . pantoprazole  40 mg Oral Daily  . [START ON 06/09/2019] predniSONE  10 mg Oral Q breakfast  . vitamin C  1,000 mg Oral Daily   Continuous Infusions:  PRN Meds:.acetaminophen, ALPRAZolam, chlorpheniramine-HYDROcodone, prochlorperazine  Antibiotics  :    Anti-infectives (From admission, onward)   None       Time Spent in minutes  30   Megan Keith M.D on 06/08/2019 at 9:44 AM  To page go to  [email or web address] - password [email or web address]  [facility] -  Office  [phone]   See all Orders from today for further details    Objective:   Vitals:   06/07/19 1600 06/07/19 2004 06/07/19 2041 06/08/19 0338  BP: 90/75  103/78 91/69  Pulse: (!) 105 91 (!) 103 78  Resp:  18 18 18   Temp: 98.7 F (37.1 C)  98.6 F (37 C) 98.3 F (36.8 C)  TempSrc: Oral  Oral Oral  SpO2: 99% 98% 100% 99%  Weight:      Height:        Wt Readings from Last 3 Encounters:  06/06/19 63.8 kg  05/16/19 66.2 kg  05/07/19 68.8 kg     Intake/Output Summary (Last 24 hours) at 06/08/2019 0944 Last data filed at 06/07/2019 1900 Gross per 24 hour  Intake 480 ml  Output -  Net 480 ml     Physical Exam  Awake Alert,   No new F.N deficits, Normal affect [place].AT,PERRAL Supple Neck,No JVD, No cervical lymphadenopathy appriciated.  Symmetrical Chest wall movement, Good air movement bilaterally, CTAB, no rales RRR,No Gallops, Rubs or new Murmurs, No Parasternal Heave +ve B.Sounds, Abd Soft, No tenderness, No organomegaly appriciated, No rebound - guarding or rigidity. No Cyanosis, Clubbing or edema, No new Rash or bruise    Data Review:    CBC Recent Labs  Lab 06/06/19 0620 06/07/19 0105 06/08/19 0044  WBC 11.2* 15.6* 13.7*  HGB 12.8 12.5 12.0  HCT 39.4 39.1 37.4  PLT 324 343 333  MCV 92.7 93.8 94.7  MCH 30.1 30.0 30.4  MCHC 32.5 32.0 32.1  RDW 13.5 13.5 13.7  LYMPHSABS 0.9 0.6* 0.7  MONOABS 0.6 0.5 0.5  EOSABS 0.0 0.0 0.0  BASOSABS 0.0 0.0 0.0    Chemistries  Recent Labs  Lab 06/06/19 0620 06/07/19 0105 06/08/19 0044  NA 138 136 136  K 3.3* 4.1 4.7  CL 103 103 104  CO2 24 24 25   GLUCOSE 133* 143* 127*  BUN 13 16 22*  CREATININE 0.67 0.66 0.54  CALCIUM 8.7* 8.8* 8.6*  MG 2.2 2.4 2.4  AST 23 24 86*  ALT 25 27 62*  ALKPHOS 105 94 86  BILITOT 0.4 0.9 0.8   ------------------------------------------------------------------------------------------------------------------ No  results for input(s): CHOL, HDL, LDLCALC, TRIG, CHOLHDL, LDLDIRECT in the last 72 hours.  Lab Results  Component Value Date   HGBA1C 5.6 03/06/2019   ------------------------------------------------------------------------------------------------------------------ Recent Labs    06/06/19 0620  TSH 0.393    Cardiac Enzymes No results for input(s): CKMB, TROPONINI, MYOGLOBIN in the last 168 hours.  Invalid input(s): CK ------------------------------------------------------------------------------------------------------------------    Component Value Date/Time   BNP 893.6 (H) 06/08/2019 0044    Micro Results No results found for this or any previous visit (from the past 240 hour(s)).  Radiology Reports Dg Chest Port 1 View  Result Date: 06/06/2019 CLINICAL DATA:  Shortness of breath. EXAM: PORTABLE CHEST 1 VIEW COMPARISON:  06/05/2019 and 04/06/2019 FINDINGS: Stable enlargement of the cardiac silhouette.  There are new patchy densities in the right lower lung which are concerning for airspace disease. Left lung is clear without significant airspace disease or edema. No evidence for large pleural effusions. Bone structures are grossly intact. IMPRESSION: 1. New patchy densities in the right lower chest. Findings are concerning for a subtle area of infection or inflammation. 2. Stable cardiomegaly. Electronically Signed   By: Markus Daft M.D.   On: 06/06/2019 12:49

## 2019-06-08 NOTE — Progress Notes (Signed)
Call from tele. Pt went to a-fib RVR rate of 150's. Norified MD. IV lopressor ordered and 535ml bolus of LR ordered. Use of interpretor to explain to pt what was happening. She verbalizes understanding. Pt back down to 120's. MD would like to get heart rate under 100. Lopressor given and bolus started to infuse over 1 hour.  tele made aware of new medication changes. Pt is asymptomatic at this time. Primary RN made aware

## 2019-06-09 LAB — COMPREHENSIVE METABOLIC PANEL
ALT: 71 U/L — ABNORMAL HIGH (ref 0–44)
AST: 56 U/L — ABNORMAL HIGH (ref 15–41)
Albumin: 3.1 g/dL — ABNORMAL LOW (ref 3.5–5.0)
Alkaline Phosphatase: 85 U/L (ref 38–126)
Anion gap: 8 (ref 5–15)
BUN: 23 mg/dL — ABNORMAL HIGH (ref 6–20)
CO2: 25 mmol/L (ref 22–32)
Calcium: 8.7 mg/dL — ABNORMAL LOW (ref 8.9–10.3)
Chloride: 104 mmol/L (ref 98–111)
Creatinine, Ser: 0.64 mg/dL (ref 0.44–1.00)
GFR calc Af Amer: >60 mL/min (ref >60)
GFR calc non Af Amer: >60 mL/min (ref >60)
Glucose, Bld: 116 mg/dL — ABNORMAL HIGH (ref 70–99)
Potassium: 5.1 mmol/L (ref 3.5–5.1)
Sodium: 137 mmol/L (ref 135–145)
Total Bilirubin: 0.5 mg/dL (ref 0.3–1.2)
Total Protein: 5.7 g/dL — ABNORMAL LOW (ref 6.5–8.1)

## 2019-06-09 LAB — MAGNESIUM: Magnesium: 2.4 mg/dL (ref 1.7–2.4)

## 2019-06-09 LAB — D-DIMER, QUANTITATIVE: D-Dimer, Quant: 0.53 ug/mL-FEU — ABNORMAL HIGH (ref 0.00–0.50)

## 2019-06-09 MED ORDER — LOSARTAN POTASSIUM 25 MG PO TABS: 12.5000 mg | ORAL_TABLET | Freq: Every day | ORAL | 0 refills | Status: DC

## 2019-06-09 MED ORDER — LOPERAMIDE HCL 2 MG PO CAPS
4.0000 mg | ORAL_CAPSULE | Freq: Four times a day (QID) | ORAL | Status: DC | PRN
  Administered 2019-06-09: 4 mg via ORAL
  Filled 2019-06-09: qty 2

## 2019-06-09 MED ORDER — DIGOXIN 125 MCG PO TABS: 0.1250 mg | ORAL_TABLET | Freq: Every day | ORAL | 11 refills | Status: DC

## 2019-06-09 MED ORDER — LACTATED RINGERS IV SOLN
INTRAVENOUS | Status: AC
  Administered 2019-06-09: 09:00:00 via INTRAVENOUS

## 2019-06-09 MED ORDER — LACTATED RINGERS IV SOLN
INTRAVENOUS | Status: AC
  Administered 2019-06-09: 11:00:00 via INTRAVENOUS

## 2019-06-09 MED ORDER — LOPERAMIDE HCL 2 MG PO CAPS: 4.0000 mg | ORAL_CAPSULE | Freq: Four times a day (QID) | ORAL | 0 refills | Status: DC | PRN

## 2019-06-09 MED ORDER — METOPROLOL TARTRATE 50 MG PO TABS
50.0000 mg | ORAL_TABLET | Freq: Two times a day (BID) | ORAL | Status: DC
  Administered 2019-06-09: 50 mg via ORAL

## 2019-06-09 NOTE — Progress Notes (Signed)
[id number]: MD round on patient. RN instructed to give oral medications including metoprolol and digoxin with LR bolus due to soft blood pressure.   1050: Post bolus blood pressure 89/66 patient asymptomatic. Patient reported new onset of diarrhea. Patient has discharge orders. MD contacted. MD states to ambulate patient, give another liter of fluid.  Patient ambulated with RN in hallways making 3 laps around unit. Denies any shortness of breath, dizziness or increased fatigue. Blood pressure post ambulation is 112/83.   1247: Blood pressure post fluid is 99/72. MD contacted. MD states patient may discharge at this time. Will provide discharge information.

## 2019-06-09 NOTE — Progress Notes (Signed)
Discharge information provided to patient via interpreter. All questions answered.

## 2019-06-09 NOTE — Discharge Summary (Signed)
Megan Keith [id number] DOB: 02/02/1961 DOA: 06/06/2019  PCP: Aldona Bar, MD  Admit date: 06/06/2019  Discharge date: 06/09/2019  Admitted From: Home   Disposition:  Home   Recommendations for Outpatient Follow-up:   Follow up with PCP in 1-2 weeks  PCP Please obtain BMP/CBC, 2 view CXR in 1week,  (see Discharge instructions)   PCP Please follow up on the following pending results: Monitor diuretic requirements and blood pressure closely.  Must follow with her cardiologist within a week.   Home Health: None Equipment/Devices: None  Consultations: None  Discharge Condition: Stable    CODE STATUS: Full    Diet Recommendation: Heart Healthy with 1.5 L/day fluid restriction.  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  She also had some diarrhea followed by some palpitations after which she arrived to [facility] ER where she was found to be hypotensive, in A. fib with RVR and had COVID positive test subsequently cardiology was consulted over the phone and she was cleared for [facility] admission.   Hospital Course    1. Acute Covid 19 Viral gastroenteritis and mild pneumonitis during the ongoing 2020 Covid 19 Pandemic - she did come quite  dehydrated and hypotensive, has been adequately hydrated with IV fluids.  Continue supportive care, have placed her on IV steroids for now.  Continues to have no pulmonary symptoms with stable inflammatory markers.  Advance activity prepare for discharge in the next 1 to 2 days.  COVID-19 Labs  Recent Labs    06/07/19 0105 06/08/19 0044 06/09/19 0054  DDIMER 0.44 0.46 0.53*  FERRITIN 18 15  --   LDH 188 207*  --   CRP <0.8 <0.8  --     Lab Results  Component Value Date   SARSCOV2NAA NEGATIVE 03/05/2019     2.  Chronic systolic CHF EF [id number].  Currently appears compensated.    He was severely dehydrated and hypotensive this admission required about 4 L of total IV fluids, blood pressure has improved, she has no rails and currently remains compensated.  At this time upon discharge she is able to tolerate beta-blockers only, I am holding her diuretics and ARB at the time of discharge.  Quested her to follow with PCP within a week PCP to monitor need for diuretics and ARB next visit based on blood pressure and fluid status. Case discussed with her cardiologist Dr. Sung Amabile on 06/06/2019.  He must follow with cardiology within a week of discharge as well.  3.  Paroxysmal atrial fibrillation currently in RVR.  Mali vas  2 score greater than 3.  Continue Eliquis, she was loaded with IV amiodarone along with IV digoxin, currently on oral digoxin and oral beta-blocker combination with good rate control, low blood pressure was a challenge which is now improved after hydration with IV fluids. Once she is discharged follow-up with Dr. Sung Amabile, Dr. Sung Amabile to decide if patient will benefit from an AICD device in the long-term.  4.  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  TSH is stable.  On methimazole and prednisone at home, currently on IV steroids due to hypotension will be continued along with home dose methimazole.  Will require outpatient follow-up with PCP for TSH, free T4 and  T3 monitoring.     5.  GERD.  On PPI.  6. HX of breast cancer.  On Arimidex, outpatient follow-up.    Discharge diagnosis     Principal Problem:   Atrial fibrillation with RVR (HCC) Active Problems:   GERD (gastroesophageal reflux disease)   Breast cancer (HCC)   Hypotension   Hyperthyroidism   Chronic systolic CHF (congestive heart failure) ([place])   COVID-19    Discharge instructions    Discharge Instructions    Diet - low sodium heart healthy   Complete by: As directed    Discharge instructions   Complete by: As directed    Follow with Primary MD Aldona Bar, MD in 7 days   Get CBC, CMP, TSH, 2 view Chest X ray -  checked next visit within 1 week by Primary MD   Activity: As tolerated with Full fall precautions use walker/cane & assistance as needed  Disposition Home    Diet: Heart Healthy    Special Instructions: If you have smoked or chewed Tobacco  in the last 2 yrs please stop smoking, stop any regular Alcohol  and or any Recreational drug use.  On your next visit with your primary care physician please Get Medicines reviewed and adjusted.  Please request your Prim.MD to go over all Hospital Tests and Procedure/Radiological results at the follow up, please get all Hospital records sent to your Prim MD by signing hospital release before you go home.  If you experience worsening of your admission symptoms, develop shortness of breath, life threatening emergency, suicidal or homicidal thoughts you must seek medical attention immediately by calling 911 or calling your MD immediately  if symptoms less severe.  You Must read complete instructions/literature along with all the possible adverse reactions/side effects for all the Medicines you take and that have been prescribed to you. Take any new Medicines after you have completely understood and accpet all the possible adverse reactions/side effects.   Discharge instructions   Complete by: As directed    Follow  with Primary MD Aldona Bar, MD and your cardiologist in 7 days   Get CBC, CMP, 2 view Chest X ray -  checked next visit within 1 week by Primary MD   Activity: As tolerated with Full fall precautions use walker/cane & assistance as needed  Disposition Home **  Diet: Heart Healthy with 1.5 L total fluid restriction per day.  Check your Weight same time everyday, if you gain over 2 pounds, or you develop in leg swelling, experience more shortness of breath or chest pain, call your Primary MD immediately. Follow Cardiac Low Salt Diet and 1.5 lit/day fluid restriction.  Special Instructions: If you have smoked or chewed Tobacco  in the last 2 yrs please stop smoking, stop any regular Alcohol  and or any Recreational  drug use.  On your next visit with your primary care physician please Get Medicines reviewed and adjusted.  Please request your Prim.MD to go over all Hospital Tests and Procedure/Radiological results at the follow up, please get all Hospital records sent to your Prim MD by signing hospital release before you go home.  If you experience worsening of your admission symptoms, develop shortness of breath, life threatening emergency, suicidal or homicidal thoughts you must seek medical attention immediately by calling 911 or calling your MD immediately  if symptoms less severe.  You Must read complete instructions/literature along with all the possible adverse reactions/side effects for all the Medicines you take and that have been prescribed to you. Take any new Medicines after you have completely understood and accpet all the possible adverse reactions/side effects.   Do not drive, operate heavy machinery, perform activities at heights, swimming or participation in water activities or provide baby sitting services if your were admitted for syncope or siezures until you have seen by Primary MD or a Neurologist and advised to do so again.   Increase activity slowly   Complete by: As  directed    Increase activity slowly   Complete by: As directed    MyChart COVID-19 home monitoring program   Complete by: Jun 09, 2019    Is the patient willing to use the [place] for home monitoring?: Yes   Temperature monitoring   Complete by: Jun 09, 2019    After how many days would you like to receive a notification of this patient's flowsheet entries?: 1      Discharge Medications   Allergies as of 06/09/2019   No Known Allergies     Medication List    STOP taking these medications   furosemide 40 MG tablet Commonly known as: Lasix   spironolactone 25 MG tablet Commonly known as: ALDACTONE     TAKE these medications   acetaminophen 325 MG tablet Commonly known as: TYLENOL Take 2 tablets (650 mg total) by mouth every 4 (four) hours as needed for headache or mild pain.   anastrozole 1 MG tablet Commonly known as: ARIMIDEX Take 1 mg by mouth daily.   apixaban 5 MG Tabs tablet Commonly known as: ELIQUIS Take 1 tablet (5 mg total) by mouth 2 (two) times daily.   D3-1000 25 MCG (1000 UT) tablet Generic drug: Cholecalciferol Take 1,000 Units by mouth daily.   digoxin 0.125 MG tablet Commonly known as: Lanoxin Take 1 tablet (0.125 mg total) by mouth daily.   losartan 25 MG tablet Commonly known as: COZAAR Take 0.5 tablets (12.5 mg total) by mouth at bedtime. Start taking on: June 12, 2019 What changed: These instructions start on June 12, 2019. If you are unsure what to do until then, ask your doctor or other care provider.   methimazole 10 MG tablet Commonly known as: TAPAZOLE Take 1 tablet (10 mg total) by mouth daily.   metoprolol succinate 25 MG 24 hr tablet Commonly known as: TOPROL-XL Take 1 tablet (25 mg total) by mouth daily.   pantoprazole 40 MG tablet Commonly known as: Protonix Take 1 tablet (40 mg total) by mouth daily. Tome 1 pastilla un vez al dia   predniSONE 10 MG tablet Commonly known as: DELTASONE Take 1 tablet  (10 mg total) by mouth daily with breakfast.   Vitamin B 12 500 MCG Tabs Take 1,000 mcg by mouth daily.   vitamin C 1000 MG tablet Take 1,000 mg by mouth daily.  Follow-up Information    [facility] Follow up.   Why: You are scheduled for a telephonic hospital follow up appointment on Tuesday, November 3,2020 at 10:50am. Please be available for this call. Contact information: [place] [phone]       Aldona Bar, MD. Schedule an appointment as soon as possible for a visit in 1 week(s).   Specialty: Internal Medicine Contact information: [place] [address] [phone]        Jolaine Artist, MD. Schedule an appointment as soon as possible for a visit in 1 week(s).   Specialty: Cardiology Contact information: [address] [place] [address] [phone]           Major procedures and Radiology Reports - PLEASE review detailed and final reports thoroughly  -         Dg Chest Port 1 View  Result Date: 06/06/2019 CLINICAL DATA:  Shortness of breath. EXAM: PORTABLE CHEST 1 VIEW COMPARISON:  06/05/2019 and 04/06/2019 FINDINGS: Stable enlargement of the cardiac silhouette. There are new patchy densities in the right lower lung which are concerning for airspace disease. Left lung is clear without significant airspace disease or edema. No evidence for large pleural effusions. Bone structures are grossly intact. IMPRESSION: 1. New patchy densities in the right lower chest. Findings are concerning for a subtle area of infection or inflammation. 2. Stable cardiomegaly. Electronically Signed   By: Markus Daft M.D.   On: 06/06/2019 12:49    Micro Results     No results found for this or any previous visit (from the past 240 hour(s)).  Today   Subjective    Megan Keith today has no headache,no chest abdominal  pain,no new weakness tingling or numbness, feels much better wants to go home today.     Objective   Blood pressure 96/75, pulse 94, temperature 98.9 F (37.2 C), temperature source Oral, resp. rate 18, height 5\' 4"  (1.626 m), weight 63.8 kg, SpO2 100 %.   Intake/Output Summary (Last 24 hours) at 06/09/2019 0914 Last data filed at 06/09/2019 0200 Gross per 24 hour  Intake 840 ml  Output 600 ml  Net 240 ml    Exam Awake Alert,  No new F.N deficits, Normal affect [place].AT,PERRAL Supple Neck,No JVD, No cervical lymphadenopathy appriciated.  Symmetrical Chest wall movement, Good air movement bilaterally, CTAB, No rales iRRR,No Gallops,Rubs or new Murmurs, No Parasternal Heave +ve B.Sounds, Abd Soft, Non tender, No organomegaly appriciated, No rebound -guarding or rigidity. No Cyanosis, Clubbing or edema, No new Rash or bruise   Data Review   CBC w Diff:  Lab Results  Component Value Date   WBC 13.7 (H) 06/08/2019   HGB 12.0 06/08/2019   HCT 37.4 06/08/2019   PLT 333 06/08/2019   LYMPHOPCT 5 06/08/2019   MONOPCT 4 06/08/2019   EOSPCT 0 06/08/2019   BASOPCT 0 06/08/2019    CMP:  Lab Results  Component Value Date   NA 137 06/09/2019   K 5.1 06/09/2019   CL 104 06/09/2019   CO2 25 06/09/2019   BUN 23 (H) 06/09/2019   CREATININE 0.64 06/09/2019   PROT 5.7 (L) 06/09/2019   ALBUMIN 3.1 (L) 06/09/2019   BILITOT 0.5 06/09/2019   ALKPHOS 85 06/09/2019   AST 56 (H) 06/09/2019   ALT 71 (H) 06/09/2019  .   Total Time in preparing paper work, data evaluation and todays exam -  35 minutes  Lala Lund M.D on 06/09/2019 at 9:14 AM  [facility]   Office  [phone]

## 2019-06-09 NOTE — Plan of Care (Signed)
  Problem: Education: Goal: Knowledge of risk factors and measures for prevention of condition will improve Outcome: Adequate for Discharge   Problem: Coping: Goal: Psychosocial and spiritual needs will be supported Outcome: Adequate for Discharge   Problem: Respiratory: Goal: Will maintain a patent airway Outcome: Adequate for Discharge Goal: Complications related to the disease process, condition or treatment will be avoided or minimized Outcome: Adequate for Discharge   Problem: Education: Goal: Ability to demonstrate management of disease process will improve Outcome: Adequate for Discharge Goal: Ability to verbalize understanding of medication therapies will improve Outcome: Adequate for Discharge Goal: Individualized Educational Video(s) Outcome: Adequate for Discharge   Problem: Activity: Goal: Capacity to carry out activities will improve Outcome: Adequate for Discharge   Problem: Cardiac: Goal: Ability to achieve and maintain adequate cardiopulmonary perfusion will improve Outcome: Adequate for Discharge   Problem: Education: Goal: Knowledge of disease or condition will improve Outcome: Adequate for Discharge Goal: Understanding of medication regimen will improve Outcome: Adequate for Discharge Goal: Individualized Educational Video(s) Outcome: Adequate for Discharge   Problem: Activity: Goal: Ability to tolerate increased activity will improve Outcome: Adequate for Discharge   Problem: Cardiac: Goal: Ability to achieve and maintain adequate cardiopulmonary perfusion will improve Outcome: Adequate for Discharge   Problem: Health Behavior/Discharge Planning: Goal: Ability to safely manage health-related needs after discharge will improve Outcome: Adequate for Discharge

## 2019-06-09 NOTE — Progress Notes (Signed)
                                      [facility]                            [place], [place] [address]      Megan Keith was admitted to the Hospital on 06/06/2019 and Discharged  06/09/2019 and should be excused from work/school   for 21  days starting from date -  06/06/2019 , may return to work/school without any restrictions.  Call Lala Lund MD, [facility]  [phone] with questions.  Lala Lund M.D on 06/09/2019,at 3:28 PM  [facility]   Office  [phone]

## 2019-06-09 NOTE — Discharge Instructions (Signed)
Follow with Primary MD Aldona Bar, MD in 7 days   Get CBC, CMP, TSH, 2 view Chest X ray -  checked next visit within 1 week by Primary MD   Activity: As tolerated with Full fall precautions use walker/cane & assistance as needed  Disposition Home    Diet: Heart Healthy    Special Instructions: If you have smoked or chewed Tobacco  in the last 2 yrs please stop smoking, stop any regular Alcohol  and or any Recreational drug use.  On your next visit with your primary care physician please Get Medicines reviewed and adjusted.  Please request your Prim.MD to go over all Hospital Tests and Procedure/Radiological results at the follow up, please get all Hospital records sent to your Prim MD by signing hospital release before you go home.  If you experience worsening of your admission symptoms, develop shortness of breath, life threatening emergency, suicidal or homicidal thoughts you must seek medical attention immediately by calling 911 or calling your MD immediately  if symptoms less severe.  You Must read complete instructions/literature along with all the possible adverse reactions/side effects for all the Medicines you take and that have been prescribed to you. Take any new Medicines after you have completely understood and accpet all the possible adverse reactions/side effects.      Person Under Monitoring Name: Megan Keith  Location: [place] [id number]   Infection Prevention Recommendations for Individuals Confirmed to have, or Being Evaluated for, 2019 Novel Coronavirus (COVID-19) Infection Who Receive Care at Home  Individuals who are confirmed to have, or are being evaluated for, COVID-19 should follow the prevention steps below until a healthcare provider or local or state health department says they can return to normal activities.  Stay home except to get medical care You should restrict activities outside your home, except for getting  medical care. Do not go to work, school, or public areas, and do not use public transportation or taxis.  Call ahead before visiting your doctor Before your medical appointment, call the healthcare provider and tell them that you have, or are being evaluated for, COVID-19 infection. This will help the healthcare providers office take steps to keep other people from getting infected. Ask your healthcare provider to call the local or state health department.  Monitor your symptoms Seek prompt medical attention if your illness is worsening (e.g., difficulty breathing). Before going to your medical appointment, call the healthcare provider and tell them that you have, or are being evaluated for, COVID-19 infection. Ask your healthcare provider to call the local or state health department.  Wear a facemask You should wear a facemask that covers your nose and mouth when you are in the same room with other people and when you visit a healthcare provider. People who live with or visit you should also wear a facemask while they are in the same room with you.  Separate yourself from other people in your home As much as possible, you should stay in a different room from other people in your home. Also, you should use a separate bathroom, if available.  Avoid sharing household items You should not share dishes, drinking glasses, cups, eating utensils, towels, bedding, or other items with other people in your home. After using these items, you should wash them thoroughly with soap and water.  Cover your coughs and sneezes Cover your mouth and nose with a tissue when you cough or sneeze, or you can cough or  sneeze into your sleeve. Throw used tissues in a lined trash can, and immediately wash your hands with soap and water for at least 20 seconds or use an alcohol-based hand rub.  Wash your [facility] your hands often and thoroughly with soap and water for at least 20 seconds. You can use an  alcohol-based hand sanitizer if soap and water are not available and if your hands are not visibly dirty. Avoid touching your eyes, nose, and mouth with unwashed hands.   Prevention Steps for Caregivers and Household Members of Individuals Confirmed to have, or Being Evaluated for, COVID-19 Infection Being Cared for in the Home  If you live with, or provide care at home for, a person confirmed to have, or being evaluated for, COVID-19 infection please follow these guidelines to prevent infection:  Follow healthcare providers instructions Make sure that you understand and can help the patient follow any healthcare provider instructions for all care.  Provide for the patients basic needs You should help the patient with basic needs in the home and provide support for getting groceries, prescriptions, and other personal needs.  Monitor the patients symptoms If they are getting sicker, call his or her medical provider and tell them that the patient has, or is being evaluated for, COVID-19 infection. This will help the healthcare providers office take steps to keep other people from getting infected. Ask the healthcare provider to call the local or state health department.  Limit the number of people who have contact with the patient  If possible, have only one caregiver for the patient.  Other household members should stay in another home or place of residence. If this is not possible, they should stay  in another room, or be separated from the patient as much as possible. Use a separate bathroom, if available.  Restrict visitors who do not have an essential need to be in the home.  Keep older adults, very young children, and other sick people away from the patient Keep older adults, very young children, and those who have compromised immune systems or chronic health conditions away from the patient. This includes people with chronic heart, lung, or kidney conditions, diabetes, and  cancer.  Ensure good ventilation Make sure that shared spaces in the home have good air flow, such as from an air conditioner or an opened window, weather permitting.  Wash your hands often  Wash your hands often and thoroughly with soap and water for at least 20 seconds. You can use an alcohol based hand sanitizer if soap and water are not available and if your hands are not visibly dirty.  Avoid touching your eyes, nose, and mouth with unwashed hands.  Use disposable paper towels to dry your hands. If not available, use dedicated cloth towels and replace them when they become wet.  Wear a facemask and gloves  Wear a disposable facemask at all times in the room and gloves when you touch or have contact with the patients blood, body fluids, and/or secretions or excretions, such as sweat, saliva, sputum, nasal mucus, vomit, urine, or feces.  Ensure the mask fits over your nose and mouth tightly, and do not touch it during use.  Throw out disposable facemasks and gloves after using them. Do not reuse.  Wash your hands immediately after removing your facemask and gloves.  If your personal clothing becomes contaminated, carefully remove clothing and launder. Wash your hands after handling contaminated clothing.  Place all used disposable facemasks, gloves, and other waste  in a lined container before disposing them with other household waste.  Remove gloves and wash your hands immediately after handling these items.  Do not share dishes, glasses, or other household items with the patient  Avoid sharing household items. You should not share dishes, drinking glasses, cups, eating utensils, towels, bedding, or other items with a patient who is confirmed to have, or being evaluated for, COVID-19 infection.  After the person uses these items, you should wash them thoroughly with soap and water.  Wash laundry thoroughly  Immediately remove and wash clothes or bedding that have blood, body  fluids, and/or secretions or excretions, such as sweat, saliva, sputum, nasal mucus, vomit, urine, or feces, on them.  Wear gloves when handling laundry from the patient.  Read and follow directions on labels of laundry or clothing items and detergent. In general, wash and dry with the warmest temperatures recommended on the label.  Clean all areas the individual has used often  Clean all touchable surfaces, such as counters, tabletops, doorknobs, bathroom fixtures, toilets, phones, keyboards, tablets, and bedside tables, every day. Also, clean any surfaces that may have blood, body fluids, and/or secretions or excretions on them.  Wear gloves when cleaning surfaces the patient has come in contact with.  Use a diluted bleach solution (e.g., dilute bleach with 1 part bleach and 10 parts water) or a household disinfectant with a label that says EPA-registered for coronaviruses. To make a bleach solution at home, add 1 tablespoon of bleach to 1 quart (4 cups) of water. For a larger supply, add  cup of bleach to 1 gallon (16 cups) of water.  Read labels of cleaning products and follow recommendations provided on product labels. Labels contain instructions for safe and effective use of the cleaning product including precautions you should take when applying the product, such as wearing gloves or eye protection and making sure you have good ventilation during use of the product.  Remove gloves and wash hands immediately after cleaning.  Monitor yourself for signs and symptoms of illness Caregivers and household members are considered close contacts, should monitor their health, and will be asked to limit movement outside of the home to the extent possible. Follow the monitoring steps for close contacts listed on the symptom monitoring form.   ? If you have additional questions, contact your local health department or call the epidemiologist on call at [phone] (available 24/7). ? This  guidance is subject to change. For the most up-to-date guidance from [facility], please refer to their website: [email or web address]

## 2019-06-12 ENCOUNTER — Encounter (HOSPITAL_COMMUNITY): Payer: Self-pay

## 2019-06-18 ENCOUNTER — Institutional Professional Consult (permissible substitution): Payer: Self-pay | Admitting: Cardiology

## 2019-06-22 ENCOUNTER — Other Ambulatory Visit: Payer: Self-pay

## 2019-06-22 DIAGNOSIS — Z170 Estrogen receptor positive status [ER+]: Secondary | ICD-10-CM

## 2019-06-22 DIAGNOSIS — C50412 Malignant neoplasm of upper-outer quadrant of left female breast: Secondary | ICD-10-CM

## 2019-06-26 ENCOUNTER — Ambulatory Visit (INDEPENDENT_AMBULATORY_CARE_PROVIDER_SITE_OTHER): Payer: HRSA Program | Admitting: Primary Care

## 2019-06-26 ENCOUNTER — Other Ambulatory Visit: Payer: Self-pay

## 2019-06-26 ENCOUNTER — Encounter (INDEPENDENT_AMBULATORY_CARE_PROVIDER_SITE_OTHER): Payer: Self-pay | Admitting: Primary Care

## 2019-06-26 DIAGNOSIS — U071 COVID-19: Secondary | ICD-10-CM

## 2019-06-26 DIAGNOSIS — Z09 Encounter for follow-up examination after completed treatment for conditions other than malignant neoplasm: Secondary | ICD-10-CM | POA: Diagnosis not present

## 2019-06-26 DIAGNOSIS — I5022 Chronic systolic (congestive) heart failure: Secondary | ICD-10-CM | POA: Diagnosis not present

## 2019-06-26 NOTE — Progress Notes (Signed)
Virtual Visit via Telephone Note  I connected with Megan Keith on 06/26/19 at 10:50 AM EST by telephone and verified that I am speaking with the correct person using two identifiers.   I discussed the limitations, risks, security and privacy concerns of performing an evaluation and management service by telephone and the availability of in person appointments. I also discussed with the patient that there may be a patient responsible charge related to this service. The patient expressed understanding and agreed to proceed.   History of Present Illness: [name] is having a tele visit for hospital discharge. She is feeling much better other than fatigue which she feels is related to her heart condition. She has a cardiology follow up scheduled for 07/03/2019- Dr. Glori Bickers for heart failure.    Past Medical History:  Diagnosis Date  . Abnormal TSH   . Anemia   . Arthritis    "knees, hands" (04/15/2017)  . Breast cancer ([place])   . Chronic knee pain   . Chronic systolic CHF (congestive heart failure) ([place])   . COVID-19   . Does not have health insurance   . Frequent PVCs   . Hyperthyroidism 03/05/2019  . Hyperthyroidism 03/05/2019  . 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 NICM (nonischemic cardiomyopathy) ([place])    a. ? PVC cardiomyopathy. right and left heart catheterization on 04/15/17, no CAD. Fick output/index 5.28/3.34.  . Noncompliance    a. episodic noncompliance.  . Pneumonia 06/2016   Observations/Objective: Review of Systems  Constitutional: Positive for malaise/fatigue.       States due to her heart condition  All other systems reviewed and are negative.  Assessment and Plan: Megan Keith was seen today for hospitalization follow-up.  Diagnoses and all orders for this visit:  Hospital discharge follow-up Patient is following up for presenting to the emergency room at [facility] due to progressively worse generalized  weakness for the past 2 weeks, but also have a right-sided weakness self resolved.  She had fatigue,low-grade fevers, chills, night sweats, decreased appetite, nonproductive cough, dyspnea, palpitations, diarrhea of about 3-4 episodes a day for about 11 days, which seems to be self limiting.  COVID-19  COVID-19 was positive. She was given instructions to continue isolation at home, for at least 10 days since the start of your fever/cough/breathlessness and until you have had 3 consecutive days without fever (without taking a fever reducer) and with cough/breathlessness improving. Please continue good preventive care measures, including:  frequent hand-washing, avoid touching your face, cover coughs/sneezes, stay out of crowds and keep a 6 foot distance from others.  Recheck or go to the nearest hospital ED tent for re-assessment if fever/cough/breathlessness return.  Chronic systolic CHF (congestive heart failure) ([place]) Followed by  Dr. Glori Bickers appointment reschedule from 06/16/2019 to 07/03/2019    Follow Up Instructions:    I discussed the assessment and treatment plan with the patient. The patient was provided an opportunity to ask questions and all were answered. The patient agreed with the plan and demonstrated an understanding of the instructions.   The patient was advised to call back or seek an in-person evaluation if the symptoms worsen or if the condition fails to improve as anticipated.  I provided 15 minutes of non-face-to-face time during this encounter.   Kerin Perna, NP

## 2019-06-29 ENCOUNTER — Encounter (HOSPITAL_COMMUNITY): Payer: Self-pay

## 2019-07-03 ENCOUNTER — Encounter (HOSPITAL_COMMUNITY): Payer: Self-pay

## 2019-07-03 ENCOUNTER — Other Ambulatory Visit: Payer: Self-pay

## 2019-07-03 ENCOUNTER — Inpatient Hospital Stay (HOSPITAL_COMMUNITY)
Admission: AD | Admit: 2019-07-03 | Discharge: 2019-07-10 | DRG: 308 | Disposition: A | Discharge status: Home / Self Care | Payer: Medicaid Other | Source: Ambulatory Visit | Attending: Internal Medicine | Admitting: Internal Medicine

## 2019-07-03 ENCOUNTER — Ambulatory Visit (HOSPITAL_COMMUNITY)
Admission: RE | Admit: 2019-07-03 | Discharge: 2019-07-03 | Disposition: A | Discharge status: Home / Self Care | Payer: Medicaid Other | Source: Ambulatory Visit | Attending: Internal Medicine | Admitting: Internal Medicine

## 2019-07-03 VITALS — BP 113/63 | HR 154 | Wt 146.0 lb

## 2019-07-03 DIAGNOSIS — I428 Other cardiomyopathies: Secondary | ICD-10-CM | POA: Insufficient documentation

## 2019-07-03 DIAGNOSIS — I959 Hypotension, unspecified: Secondary | ICD-10-CM | POA: Diagnosis not present

## 2019-07-03 DIAGNOSIS — M19049 Primary osteoarthritis, unspecified hand: Secondary | ICD-10-CM | POA: Insufficient documentation

## 2019-07-03 DIAGNOSIS — Z853 Personal history of malignant neoplasm of breast: Secondary | ICD-10-CM

## 2019-07-03 DIAGNOSIS — I4891 Unspecified atrial fibrillation: Secondary | ICD-10-CM | POA: Diagnosis present

## 2019-07-03 DIAGNOSIS — Z79899 Other long term (current) drug therapy: Secondary | ICD-10-CM | POA: Insufficient documentation

## 2019-07-03 DIAGNOSIS — Z79811 Long term (current) use of aromatase inhibitors: Secondary | ICD-10-CM | POA: Diagnosis not present

## 2019-07-03 DIAGNOSIS — Z91120 Patient's intentional underdosing of medication regimen due to financial hardship: Secondary | ICD-10-CM

## 2019-07-03 DIAGNOSIS — T45516A Underdosing of anticoagulants, initial encounter: Secondary | ICD-10-CM | POA: Diagnosis present

## 2019-07-03 DIAGNOSIS — Z8619 Personal history of other infectious and parasitic diseases: Secondary | ICD-10-CM | POA: Diagnosis not present

## 2019-07-03 DIAGNOSIS — C50912 Malignant neoplasm of unspecified site of left female breast: Secondary | ICD-10-CM | POA: Insufficient documentation

## 2019-07-03 DIAGNOSIS — I272 Pulmonary hypertension, unspecified: Secondary | ICD-10-CM | POA: Diagnosis present

## 2019-07-03 DIAGNOSIS — I4819 Other persistent atrial fibrillation: Principal | ICD-10-CM | POA: Diagnosis present

## 2019-07-03 DIAGNOSIS — Z7952 Long term (current) use of systemic steroids: Secondary | ICD-10-CM

## 2019-07-03 DIAGNOSIS — E876 Hypokalemia: Secondary | ICD-10-CM | POA: Diagnosis present

## 2019-07-03 DIAGNOSIS — E0590 Thyrotoxicosis, unspecified without thyrotoxic crisis or storm: Secondary | ICD-10-CM | POA: Diagnosis present

## 2019-07-03 DIAGNOSIS — I493 Ventricular premature depolarization: Secondary | ICD-10-CM | POA: Diagnosis present

## 2019-07-03 DIAGNOSIS — I5023 Acute on chronic systolic (congestive) heart failure: Secondary | ICD-10-CM | POA: Diagnosis present

## 2019-07-03 DIAGNOSIS — Z923 Personal history of irradiation: Secondary | ICD-10-CM | POA: Diagnosis not present

## 2019-07-03 DIAGNOSIS — Z8249 Family history of ischemic heart disease and other diseases of the circulatory system: Secondary | ICD-10-CM

## 2019-07-03 DIAGNOSIS — Z7901 Long term (current) use of anticoagulants: Secondary | ICD-10-CM | POA: Insufficient documentation

## 2019-07-03 DIAGNOSIS — Z23 Encounter for immunization: Secondary | ICD-10-CM | POA: Diagnosis not present

## 2019-07-03 DIAGNOSIS — R0600 Dyspnea, unspecified: Secondary | ICD-10-CM

## 2019-07-03 DIAGNOSIS — I5030 Unspecified diastolic (congestive) heart failure: Secondary | ICD-10-CM

## 2019-07-03 DIAGNOSIS — M1710 Unilateral primary osteoarthritis, unspecified knee: Secondary | ICD-10-CM | POA: Insufficient documentation

## 2019-07-03 LAB — CBC
HCT: 40.1 % (ref 36.0–46.0)
Hemoglobin: 12.6 g/dL (ref 12.0–15.0)
MCH: 29.8 pg (ref 26.0–34.0)
MCHC: 31.4 g/dL (ref 30.0–36.0)
MCV: 94.8 fL (ref 80.0–100.0)
Platelets: 420 10*3/uL — ABNORMAL HIGH (ref 150–400)
RBC: 4.23 MIL/uL (ref 3.87–5.11)
RDW: 14.7 % (ref 11.5–15.5)
WBC: 13.3 10*3/uL — ABNORMAL HIGH (ref 4.0–10.5)
nRBC: 0 % (ref 0.0–0.2)

## 2019-07-03 LAB — COMPREHENSIVE METABOLIC PANEL
ALT: 59 U/L — ABNORMAL HIGH (ref 0–44)
AST: 24 U/L (ref 15–41)
Albumin: 4 g/dL (ref 3.5–5.0)
Alkaline Phosphatase: 108 U/L (ref 38–126)
Anion gap: 11 (ref 5–15)
BUN: 14 mg/dL (ref 6–20)
CO2: 23 mmol/L (ref 22–32)
Calcium: 9 mg/dL (ref 8.9–10.3)
Chloride: 105 mmol/L (ref 98–111)
Creatinine, Ser: 0.94 mg/dL (ref 0.44–1.00)
GFR calc Af Amer: >60 mL/min (ref >60)
GFR calc non Af Amer: >60 mL/min (ref >60)
Glucose, Bld: 128 mg/dL — ABNORMAL HIGH (ref 70–99)
Potassium: 3.7 mmol/L (ref 3.5–5.1)
Sodium: 139 mmol/L (ref 135–145)
Total Bilirubin: 0.8 mg/dL (ref 0.3–1.2)
Total Protein: 6.8 g/dL (ref 6.5–8.1)

## 2019-07-03 LAB — TSH: TSH: 0.354 u[IU]/mL (ref 0.350–4.500)

## 2019-07-03 LAB — DIGOXIN LEVEL: Digoxin Level: 0.7 ng/mL — ABNORMAL LOW (ref 0.8–2.0)

## 2019-07-03 LAB — T4, FREE: Free T4: 1.35 ng/dL — ABNORMAL HIGH (ref 0.61–1.12)

## 2019-07-03 MED ORDER — APIXABAN 5 MG PO TABS
5.0000 mg | ORAL_TABLET | Freq: Two times a day (BID) | ORAL | Status: DC
  Administered 2019-07-03 – 2019-07-10 (×14): 5 mg via ORAL
  Filled 2019-07-03 (×14): qty 1

## 2019-07-03 MED ORDER — LOSARTAN POTASSIUM 25 MG PO TABS
12.5000 mg | ORAL_TABLET | Freq: Every day | ORAL | Status: DC
  Administered 2019-07-04 – 2019-07-09 (×5): 12.5 mg via ORAL
  Filled 2019-07-03 (×6): qty 1

## 2019-07-03 MED ORDER — AMIODARONE HCL IN DEXTROSE 360-4.14 MG/200ML-% IV SOLN
60.0000 mg/h | INTRAVENOUS | Status: AC
  Administered 2019-07-03: 60 mg/h via INTRAVENOUS
  Filled 2019-07-03: qty 200

## 2019-07-03 MED ORDER — SODIUM CHLORIDE 0.9 % IV SOLN: 250.0000 mL | INTRAVENOUS | Status: DC | PRN

## 2019-07-03 MED ORDER — METHIMAZOLE 10 MG PO TABS
10.0000 mg | ORAL_TABLET | Freq: Every day | ORAL | Status: DC
  Administered 2019-07-04 – 2019-07-10 (×7): 10 mg via ORAL
  Filled 2019-07-03 (×8): qty 1

## 2019-07-03 MED ORDER — ACETAMINOPHEN 325 MG PO TABS
650.0000 mg | ORAL_TABLET | ORAL | Status: DC | PRN
  Administered 2019-07-05 – 2019-07-09 (×2): 650 mg via ORAL
  Filled 2019-07-03 (×3): qty 2

## 2019-07-03 MED ORDER — AMIODARONE LOAD VIA INFUSION
150.0000 mg | Freq: Once | INTRAVENOUS | Status: AC
  Administered 2019-07-03: 150 mg via INTRAVENOUS
  Filled 2019-07-03: qty 83.34

## 2019-07-03 MED ORDER — DIGOXIN 125 MCG PO TABS
0.1250 mg | ORAL_TABLET | Freq: Every day | ORAL | Status: DC
  Administered 2019-07-04 – 2019-07-10 (×7): 0.125 mg via ORAL
  Filled 2019-07-03 (×8): qty 1

## 2019-07-03 MED ORDER — METOPROLOL SUCCINATE ER 25 MG PO TB24
25.0000 mg | ORAL_TABLET | Freq: Every day | ORAL | Status: DC
  Filled 2019-07-03 (×2): qty 1

## 2019-07-03 MED ORDER — SODIUM CHLORIDE 0.9% FLUSH
3.0000 mL | Freq: Two times a day (BID) | INTRAVENOUS | Status: DC
  Administered 2019-07-05 – 2019-07-08 (×6): 3 mL via INTRAVENOUS

## 2019-07-03 MED ORDER — AMIODARONE HCL IN DEXTROSE 360-4.14 MG/200ML-% IV SOLN
30.0000 mg/h | INTRAVENOUS | Status: DC
  Administered 2019-07-04 – 2019-07-07 (×7): 30 mg/h via INTRAVENOUS
  Filled 2019-07-03 (×7): qty 200

## 2019-07-03 MED ORDER — FUROSEMIDE 10 MG/ML IJ SOLN
80.0000 mg | Freq: Two times a day (BID) | INTRAMUSCULAR | Status: DC
  Administered 2019-07-03: 80 mg via INTRAVENOUS
  Filled 2019-07-03 (×2): qty 8

## 2019-07-03 MED ORDER — SODIUM CHLORIDE 0.9% FLUSH
3.0000 mL | INTRAVENOUS | Status: DC | PRN
  Administered 2019-07-08: 3 mL via INTRAVENOUS
  Filled 2019-07-03: qty 3

## 2019-07-03 MED ORDER — ENTRESTO 24-26 MG PO TABS: 1.0000 | ORAL_TABLET | Freq: Two times a day (BID) | ORAL | 11 refills | Status: DC

## 2019-07-03 MED ORDER — PREDNISONE 10 MG PO TABS
10.0000 mg | ORAL_TABLET | Freq: Every day | ORAL | Status: DC
  Administered 2019-07-04 – 2019-07-10 (×7): 10 mg via ORAL
  Filled 2019-07-03 (×7): qty 1

## 2019-07-03 MED ORDER — ALPRAZOLAM 0.25 MG PO TABS: 0.2500 mg | ORAL_TABLET | Freq: Three times a day (TID) | ORAL | 0 refills | Status: DC | PRN

## 2019-07-03 NOTE — H&P (Addendum)
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   She presented to [facility] on 04/13/17 with neck pain, SOB and orthopnea. Last EF prior to that reported by Dr. [name] was 35%. Repeat echo showed reduced EF of 15%, she was transferred to [facility] for further evaluation. Underwent right and left heart catheterization on 04/15/17, no CAD. Fick output/index 5.28/3.34. Full results below.   Noted to have >20% PVC on telemetry. She was started on IV Amio and transitioned to po Amio. Also started on digoxin and spiro prior to discharge. BP was too soft to add any other medications. Discharge weight was 143 pounds.   Admitted 7/13-18/2020 with ADHF in setting of new-onset AF in the setting of hyperthyroidism. TEE with EF 10% RV ok. Started on amiodarone and diuresed. Underwent TEE/DC-CV and felt much better. Also treated with methimazole and prednisone.   Seen again 04/2019. Weight was up a few pounds. Zio patch placed to monitor PVC burden   Zio patch 8/20 - NSR avg HR 75 - 13 runs NSVT longest 14 beats - 59 runs SVT longest 9.4 seconds - PVC burden 4.8%  Admitted 10/14-10/17 to [facility] for COVID 19 infection c/b gastroenteritis and recurrent afib w/ RVR. Was loaded on IV amiodarone and IV digoxin and rate improved but did not convert back to NSR. Was placed on Eliquis and discharged home on PO digoxin and metoprolol. Was advised to f/u in [facility] for further management.   She presents back to clinic today w/ interpreter and granddaughter. Remains in persistent afib w/ RVR in the 150s. BP 113/63. Volume overload on exam w/ 1+ bilateral LEE. Denies  palpitations but notes DOE. No CP. Denies cough, fever and chills. Has been compliant with all meds except Eliquis. Unable to afford cost. Recently submitted paperwork for cost assistance.    Echo 03/2017 EF 10-15% Echo 07/2017 EF 25-30%  Echo 11/2017 EF 25-30% Echo 06/2018: EF ~30% (per Dr Haroldine Laws) Echo 8/20 EF 25-30%     Review of Systems: [y] = yes, [ ]  = no   General: Weight gain [ ] ; Weight loss [ ] ; Anorexia [ ] ; Fatigue [ ] ; Fever [ ] ; Chills [ ] ; Weakness [ ]   Cardiac: Chest pain/pressure [ ] ; Resting SOB [ ] ; Exertional SOB [ ] ; Orthopnea [ ] ; Pedal Edema [ ] ; Palpitations [ ] ; Syncope [ ] ; Presyncope [ ] ; Paroxysmal nocturnal dyspnea[ ]   Pulmonary: Cough [ ] ; Wheezing[ ] ; Hemoptysis[ ] ; Sputum [ ] ; Snoring [ ]   GI: Vomiting[ ] ; Dysphagia[ ] ; Melena[ ] ; Hematochezia [ ] ; Heartburn[ ] ; Abdominal pain [ ] ; Constipation [ ] ; Diarrhea [ ] ; BRBPR [ ]   GU: Hematuria[ ] ; Dysuria [ ] ; Nocturia[ ]   Vascular: Pain in legs with walking [ ] ; Pain in feet with lying flat [ ] ; Non-healing sores [ ] ; Stroke [ ] ; TIA [ ] ; Slurred speech [ ] ;  Neuro: Headaches[ ] ; Vertigo[ ] ; Seizures[ ] ; Paresthesias[ ] ;Blurred vision [ ] ; Diplopia [ ] ; Vision changes [ ]   Ortho/Skin: Arthritis [ ] ; Joint pain [ ] ; Muscle pain [ ] ; Joint swelling [ ] ; Back Pain [ ] ; Rash [ ]   Psych: Depression[ ] ; Anxiety[ ]   Heme: Bleeding problems [ ] ; Clotting disorders [ ] ; Anemia [ ]   Endocrine: Diabetes [ ] ; Thyroid dysfunction[ ]    Home Medications Prior to Admission medications   Medication Sig Start Date End Date Taking? Authorizing Provider  anastrozole (ARIMIDEX) 1 MG tablet Take 1 mg by mouth daily.    [provider]  apixaban (ELIQUIS) 5 MG TABS tablet Take 1 tablet (5 mg total) by mouth 2 (two) times daily. 04/11/19   Rebbecca Osuna, Shaune Pascal, MD  Ascorbic Acid (VITAMIN C) 1000 MG tablet Take 1,000 mg by mouth daily.    [provider]  Cholecalciferol (D3-1000) 25 MCG (1000 UT) tablet Take 1,000  Units by mouth daily.    [provider]  Cyanocobalamin (VITAMIN B 12) 500 MCG TABS Take 1,000 mcg by mouth daily.     [provider]  digoxin (LANOXIN) 0.125 MG tablet Take 1 tablet (0.125 mg total) by mouth daily. 06/09/19 06/08/20  Thurnell Lose, MD  loperamide (IMODIUM) 2 MG capsule Take 2 capsules (4 mg total) by mouth every 6 (six) hours as needed for diarrhea or loose stools. 06/09/19   Thurnell Lose, MD  losartan (COZAAR) 25 MG tablet Take 12.5 mg by mouth at bedtime.    [provider]  methimazole (TAPAZOLE) 10 MG tablet Take 1 tablet (10 mg total) by mouth daily. 05/07/19   Cortland Crehan, Shaune Pascal, MD  metoprolol succinate (TOPROL-XL) 25 MG 24 hr tablet Take 1 tablet (25 mg total) by mouth daily. 05/07/19   Cashel Bellina, Shaune Pascal, MD  predniSONE (DELTASONE) 10 MG tablet Take 1 tablet (10 mg total) by mouth daily with breakfast. 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 ? PVC cardiomyopathy. right and left heart catheterization on 04/15/17, no CAD. Fick output/index 5.28/3.34.   Noncompliance    a. episodic noncompliance.   Pneumonia 06/2016    Past Surgical History: Past Surgical History:  Procedure Laterality Date   BREAST CYST EXCISION Left    CARDIOVERSION N/A 03/09/2019   Procedure: CARDIOVERSION;  Surgeon: Jolaine Artist, MD;  Location: [place];  Service: Cardiovascular;  Laterality: N/A;   CORONARY/GRAFT ANGIOGRAPHY N/A 04/15/2017   Procedure: Remus Blake ANGIOGRAPHY;   Surgeon: Nelva Bush, MD;  Location: [place] CV [facility];  Service: Cardiovascular;  Laterality: N/A;   RIGHT HEART CATH N/A 04/15/2017   Procedure: RIGHT HEART CATH;  Surgeon: Nelva Bush, MD;  Location: [place] [facility];  Service: Cardiovascular;  Laterality: N/A;   TEE WITHOUT CARDIOVERSION N/A 03/09/2019   Procedure: TRANSESOPHAGEAL ECHOCARDIOGRAM (TEE);  Surgeon: Jolaine Artist, MD;  Location: [facility] ENDOSCOPY;  Service: Cardiovascular;  Laterality: N/A;   TUBAL LIGATION      Family History:  Family History  Problem Relation Age of Onset   Hypertension Mother     Social History: Social History   Socioeconomic History   Marital status: Single    Spouse name: Not on  file   Number of children: Not on file   Years of education: Not on file   Highest education level: Not on file  Occupational History   Not on file  Social Needs   Financial resource strain: Not on file   Food insecurity    Worry: Patient refused    Inability: Patient refused   Transportation needs    Medical: Patient refused    Non-medical: Patient refused  Tobacco Use   Smoking status: Never Smoker   Smokeless tobacco: Never Used  Substance and Sexual Activity   Alcohol use: Yes    Comment: 04/15/2017 "might have a couple beers q couple months"   Drug use: No   Sexual activity: Never  Lifestyle   Physical activity    Days per week: Not on file    Minutes per session: Not on file   Stress: Only a little  Relationships   Press photographer on phone: Not on file    Gets together: Not on file    Attends religious service: Not on file    Active member of club or organization: Not on file    Attends meetings of clubs or organizations: Not on file    Relationship status: Not on file  Other Topics Concern   Not on file  Social History Narrative   Not on file    Allergies:  No Known Allergies  Objective:    Vital Signs:  Wt 146 lb  BP:113/63 EKG: afib  w/ RVR 151 bpm O2 sats 98% on RA   There were no vitals filed for this visit.   Physical Exam     General:  Well appearing Hispanic female. No respiratory difficulty HEENT: Normal Neck: Supple. Elevated JVD. Carotids 2+ bilat; no bruits. No lymphadenopathy or thyromegaly appreciated. Cor: PMI nondisplaced. irregularly irregular rhythm, tachy rate. No rubs, gallops or murmurs. Lungs: Clear Abdomen: Soft, nontender, nondistended. No hepatosplenomegaly. No bruits or masses. Good bowel sounds. Extremities: No cyanosis, clubbing, rash, 1+ bilateral LE edema Neuro: Alert & oriented x 3, cranial nerves grossly intact. moves all 4 extremities w/o difficulty. Affect pleasant.   Telemetry   Afib w/ RVR   EKG   Atrial fibrillation 151 bpm   Labs     Basic Metabolic Panel: No results for input(s): NA, K, CL, CO2, GLUCOSE, BUN, CREATININE, CALCIUM, MG, PHOS in the last 168 hours.  Liver Function Tests: No results for input(s): AST, ALT, ALKPHOS, BILITOT, PROT, ALBUMIN in the last 168 hours. No results for input(s): LIPASE, AMYLASE in the last 168 hours. No results for input(s): AMMONIA in the last 168 hours.  CBC: No results for input(s): WBC, NEUTROABS, HGB, HCT, MCV, PLT in the last 168 hours.  Cardiac Enzymes: No results for input(s): CKTOTAL, CKMB, CKMBINDEX, TROPONINI in the last 168 hours.  BNP: BNP (last 3 results) Recent Labs    06/06/19 0620 06/07/19 0105 06/08/19 0044  BNP 1,146.8* 1,294.9* 893.6*    ProBNP (last 3 results) No results for input(s): PROBNP in the last 8760 hours.   CBG: No results for input(s): GLUCAP in the last 168 hours.  Coagulation Studies: No results for input(s): LABPROT, INR in the last 72 hours.  Imaging: No results found.   Patient Profile   58 y/o female w/ h/o chronic systolic HF, felt to be tachy mediated by PVCs and atrial fibrillation, as well as h/o hyperthyroidism, limiting use of amiodarone, recent COVID 19 infection  05/2019, being directly admitted from  [facility] for persistent atrial fibrillation w/ RVR and acute on chronic systolic HF.    Assessment/Plan   1. Persistent Atrial Fibrillation w/ RVR: -rate in the 150s. BP 113/63.  -in the setting of hyperthyroidism, limiting use of amiodarone. -Will admit to [facility] for IV amiodarone load for rate control + EP consult in the AM for consideration for AV nodal ablation w/ PPM implant -Eliquis 5 mg bid -Check TFTs (TSH, Free T3/T4) -Continue Toprol XL 25 mg daily  -Continue digoxin 0.125 mg   2. Acute on Chronic systolic CHF: NICM, normal cors in 03/2017. 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 03/2017 EF 15-20% Echo 07/28/17 EF 25-30%  - Echo 11/2017: EF 25-30%.  - Echo 06/2018 EF ~30% - Echo 7/20 in setting of PAF with RVR. EF 10% - Echo 8/20 EF 25-30% - Currently w/ NYHA III symptoms. Volume overloaded in the setting of afib w/ RVR - Admit to hospital per above. - Start IV Lasix 80 mg bid - Continue losartan at 12.5 mg  - Continue Toprol 25 bid - Unable to get cMRI due to lack of insurance.  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 Hopefull that AV nodal ablation will lead to recovery of EF.    3. Hyperthyroidism - continue methimazole on admit - Recheck labs today  4. 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 Amio previously stopped due to hyperthyroidism. - will ask EP to see this admit for afib ablation. ? PVC ablation as well  5. Left Breast Cancer. - treated w/ XRT    Lyda Jester, PA-C 07/03/2019, 4:34 PM  Advanced Heart Failure Team Pager [phone] (M-F; 7a - 4p)  Please contact [facility] for night-coverage after hours (4p -7a ) and weekends on [email or web address]  Patient seen and examined with the above-signed Advanced Practice Provider and/or Housestaff. I personally reviewed laboratory data, imaging studies and relevant notes. I independently examined the patient and formulated the important  aspects of the plan. I have edited the note to reflect any of my changes or salient points. I have personally discussed the plan with the patient and/or family.  Difficult case. 58 y/o woman with severe NICM felt secondary to frequent PVCs. PAF with RVR c/b amiodarone hyperthyroidism and medication non compliance, recent COVID.   Underwent DC-CV in 7/20. Zio patch in 8/20 with NSR. 4.8% PVCs  Developed COVID recently with recurrent AF with RVR. Required restarting amio for rate control   Presents to clinic with recurrent HF in setting of AF with RVR in 150s. + volume overload  General: sitting up in chair  No resp difficulty HEENT: normal Neck: supple. JVP 10-11 Carotids 2+ bilat; no bruits. No lymphadenopathy or thryomegaly appreciated. Cor: PMI nondisplaced. Irr tachy No rubs, gallops or murmurs. Lungs: clear Abdomen: soft, nontender, nondistended. No hepatosplenomegaly. No bruits or masses. Good bowel sounds. Extremities: no cyanosis, clubbing, rash, 2+ edema Neuro: alert & orientedx3, cranial nerves grossly intact. moves all 4 extremities w/o difficulty. Affect pleasant  Will admit for IV diuresis and management of AF. Suspect AF may be contributing to CM. Options for Af discussed.   Options are: 1) Restart amio and proceed with DC-CV but I am afraid this may not hold and we had to stop amio in past for hyperthyroidism. Management complicated by med non-compliance  2) PVI (however LA 5.1 cm) 3) AVN ablation with CRT  Given her non-compliance and problems with amio. I think AVN ablation and CRT may be best  option. Will d/w EP in am.   Glori Bickers, MD  5:39 PM

## 2019-07-03 NOTE — Progress Notes (Signed)
Called by RN due to 21 bt run VT.  RN in process of starting amio, pt just got the bolus.   Pt was asymptomatic.  No change in treatment, continue amio.  Rosaria Ferries, PA-C 07/03/2019 6:29 PM Beeper [phone]

## 2019-07-03 NOTE — Progress Notes (Signed)
Advanced Heart Failure Clinic Note   Referring Physician: PCP: Kerin Perna, NP PCP-Cardiologist: Glori Bickers, MD   Reason for Visit: A/c systolic HF and persistent atrial fibrillation w/ RVR  HPI:  Megan Keith is a 58 y.o. 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    She presented to [facility] on 04/13/17 with neck pain, SOB and orthopnea. Last EF prior to that reported by Dr. [name] was 35%. Repeat echo showed reduced EF of 15%, she was transferred to [facility] for further evaluation. Underwent right and left heart catheterization on 04/15/17, no CAD. Fick output/index 5.28/3.34. Full results below.   Noted to have >20% PVC on telemetry. She was started on IV Amio and transitioned to po Amio. Also started on digoxin and spiro prior to discharge. BP was too soft to add any other medications. Discharge weight was 143 pounds.   Admitted 7/13-18/2020 with ADHF in setting of new-onset AF in the setting of hyperthyroidism. TEE with EF 10% RV ok. Started on amiodarone and diuresed. Underwent TEE/DC-CV and felt much better. Also treated with methimazole and prednisone.   Seen again 04/2019. Weight was up a few pounds. Zio patch placed to monitor PVC burden   Zio patch 8/20 - NSR avg HR 75 - 13 runs NSVT longest 14 beats - 59 runs SVT longest 9.4 seconds - PVC burden 4.8%  Admitted 10/14-10/17 to [facility] for COVID 19 infection c/b gastroenteritis and recurrent afib w/ RVR. Was loaded on IV amiodarone and IV digoxin and rate improved but did not convert back to NSR. Was placed on Eliquis and discharged home on PO digoxin and metoprolol. Was advised to f/u in [facility] for further management.   She presents back to clinic today w/ interpreter and granddaughter. Remains in persistent afib w/ RVR in the 150s. BP 113/63. Volume overload on exam w/ 1+ bilateral LEE. Denies palpitations but  notes DOE. No CP. Denies cough, fever and chills. Has been compliant with all meds except Eliquis. Unable to afford cost. Recently submitted paperwork for cost assistance.    Echo 03/2017 EF 10-15% Echo 07/2017 EF 25-30%  Echo 11/2017 EF 25-30% Echo 06/2018: EF ~30% (per Dr Haroldine Laws) Echo 8/20 EF 25-30%     Review of Systems: [y] = yes, [ ]  = no   General: Weight gain [ ] ; Weight loss [ ] ; Anorexia [ ] ; Fatigue [ ] ; Fever [ ] ; Chills [ ] ; Weakness [ ]   Cardiac: Chest pain/pressure [ ] ; Resting SOB [ ] ; Exertional SOB [ Y]; Orthopnea [ ] ; Pedal Edema [ ] ; Palpitations [ ] ; Syncope [ ] ; Presyncope [ ] ; Paroxysmal nocturnal dyspnea[ ]   Pulmonary: Cough [ ] ; Wheezing[ ] ; Hemoptysis[ ] ; Sputum [ ] ; Snoring [ ]   GI: Vomiting[ ] ; Dysphagia[ ] ; Melena[ ] ; Hematochezia [ ] ; Heartburn[ ] ; Abdominal pain [ ] ; Constipation [ ] ; Diarrhea [ ] ; BRBPR [ ]   GU: Hematuria[ ] ; Dysuria [ ] ; Nocturia[ ]   Vascular: Pain in legs with walking [ ] ; Pain in feet with lying flat [ ] ; Non-healing sores [ ] ; Stroke [ ] ; TIA [ ] ; Slurred speech [ ] ;  Neuro: Headaches[ ] ; Vertigo[ ] ; Seizures[ ] ; Paresthesias[ ] ;Blurred vision [ ] ; Diplopia [ ] ; Vision changes [ ]   Ortho/Skin: Arthritis [ ] ; Joint pain [ ] ; Muscle pain [ ] ; Joint swelling [ ] ; Back Pain [ ] ; Rash [ ]   Psych: Depression[ ] ; Anxiety[ ]   Heme: Bleeding problems [ ] ; Clotting disorders [ ] ; Anemia [ ]   Endocrine: Diabetes [ ] ; Thyroid dysfunction[ ]    Past Medical History:  Diagnosis Date  . Abnormal TSH   . Anemia   . Arthritis    "knees, hands" (04/15/2017)  . Breast cancer ([place])   . Chronic knee pain   . Chronic systolic CHF (congestive heart failure) ([place])   . COVID-19   . Does not have health insurance   . Frequent PVCs   . Hyperthyroidism 03/05/2019  . Hyperthyroidism 03/05/2019  . 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) 04/15/2017  . NICM (nonischemic cardiomyopathy) ([place])    a. ? PVC cardiomyopathy. right and left heart catheterization  on 04/15/17, no CAD. Fick output/index 5.28/3.34.  . Noncompliance    a. episodic noncompliance.  . Pneumonia 06/2016    Current Outpatient Medications  Medication Sig Dispense Refill  . anastrozole (ARIMIDEX) 1 MG tablet Take 1 mg by mouth daily.    Marland Kitchen apixaban (ELIQUIS) 5 MG TABS tablet Take 1 tablet (5 mg total) by mouth 2 (two) times daily. 60 tablet 11  . Ascorbic Acid (VITAMIN C) 1000 MG tablet Take 1,000 mg by mouth daily.    . Cholecalciferol (D3-1000) 25 MCG (1000 UT) tablet Take 1,000 Units by mouth daily.    . Cyanocobalamin (VITAMIN B 12) 500 MCG TABS Take 1,000 mcg by mouth daily.     . digoxin (LANOXIN) 0.125 MG tablet Take 1 tablet (0.125 mg total) by mouth daily. 30 tablet 11  . loperamide (IMODIUM) 2 MG capsule Take 2 capsules (4 mg total) by mouth every 6 (six) hours as needed for diarrhea or loose stools. 15 capsule 0  . methimazole (TAPAZOLE) 10 MG tablet Take 1 tablet (10 mg total) by mouth daily. 30 tablet 11  . metoprolol succinate (TOPROL-XL) 25 MG 24 hr tablet Take 1 tablet (25 mg total) by mouth daily. 60 tablet 11  . predniSONE (DELTASONE) 10 MG tablet Take 1 tablet (10 mg total) by mouth daily with breakfast. 30 tablet 6   No current facility-administered medications for this encounter.     No Known Allergies    Social History   Socioeconomic History  . Marital status: Single    Spouse name: Not on file  . Number of children: Not on file  . Years of education: Not on file  . Highest education level: Not on file  Occupational History  . Not on file  Social Needs  . Financial resource strain: Not on file  . Food insecurity    Worry: Patient refused    Inability: Patient refused  . Transportation needs    Medical: Patient refused    Non-medical: Patient refused  Tobacco Use  . Smoking status: Never Smoker  . Smokeless tobacco: Never Used  Substance and Sexual Activity  . Alcohol use: Yes    Comment: 04/15/2017 "might have a couple beers q couple  months"  . Drug use: No  . Sexual activity: Never  Lifestyle  . Physical activity    Days per week: Not on file    Minutes per session: Not on file  . Stress: Only a little  Relationships  . Social Herbalist on phone: Not on file    Gets together: Not on file    Attends religious service: Not on file    Active member of club or organization: Not on file    Attends meetings of clubs or organizations: Not on file  Relationship status: Not on file  . Intimate partner violence    Fear of current or ex partner: Not on file    Emotionally abused: Not on file    Physically abused: Not on file    Forced sexual activity: Not on file  Other Topics Concern  . Not on file  Social History Narrative  . Not on file      Family History  Problem Relation Age of Onset  . Hypertension Mother     Vitals:   07/03/19 1515  BP: 113/63  Pulse: (!) 154  SpO2: 98%  Weight: 66.2 kg (146 lb)     PHYSICAL EXAM: General:  Well appearing Hispanic female. No respiratory difficulty HEENT: normal Neck: supple. Elevated JVD. Carotids 2+ bilat; no bruits. No lymphadenopathy or thyromegaly appreciated. Cor: PMI nondisplaced.  irregularly irregular rhythm, tachy rate. No rubs, gallops or murmurs. Lungs: clear Abdomen: soft, nontender, nondistended. No hepatosplenomegaly. No bruits or masses. Good bowel sounds. Extremities: no cyanosis, clubbing, rash, 1+ bilateral LE edema Neuro: alert & oriented x 3, cranial nerves grossly intact. moves all 4 extremities w/o difficulty. Affect pleasant.  ECG: atrial fibrillation w/ RVR 151 bpm   ASSESSMENT & PLAN:  1. Persistent Atrial Fibrillation w/ RVR: -rate in the 150s. BP 113/63.  -in the setting of hyperthyroidism, limiting use of amiodarone. -Will admit to [facility] for IV amiodarone load for rate control + EP consult in the AM for consideration for AV nodal ablation w/ PPM implant -Eliquis 5 mg bid -Check TFTs (TSH, Free T3/T4) -Continue  Toprol XL 25 mg daily  -Continue digoxin 0.125 mg   2. Acute on Chronic systolic CHF: NICM, normal cors in 03/2017. 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 03/2017  EF 15-20% Echo 07/28/17 EF 25-30%  - Echo 11/2017: EF 25-30%.  - Echo 06/2018 EF ~30% - Echo 7/20 in setting of PAF with RVR. EF 10% - Echo 8/20 EF 25-30%  - Currently w/ NYHA III symptoms. Volume overloaded in the setting of afib w/ RVR - Admit to hospital per above. - Start IV Lasix 80 mg bid - Continue losartan at 12.5 mg  - Continue Toprol 25 bid - Unable to get cMRI due to lack of insurance.  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 Hopefull that AV nodal ablation will lead to recovery of EF.    3. Hyperthyroidism - continue methimazole on admit - Recheck labs today  4. 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 Amio previously stopped due to hyperthyroidism.  - will ask EP to see this admit for afib ablation. ? PVC ablation as well  5. Left Breast Cancer. - treated w/ XRT  Discussed w/ Dr. Haroldine Laws who has also seen and examined pt. He agrees w/ admission and plan outlined above.    Lyda Jester, PA-C 07/03/19

## 2019-07-04 DIAGNOSIS — I4819 Other persistent atrial fibrillation: Principal | ICD-10-CM

## 2019-07-04 LAB — T3, FREE: T3, Free: 3.7 pg/mL (ref 2.0–4.4)

## 2019-07-04 LAB — BASIC METABOLIC PANEL
Anion gap: 12 (ref 5–15)
BUN: 13 mg/dL (ref 6–20)
CO2: 24 mmol/L (ref 22–32)
Calcium: 8.7 mg/dL — ABNORMAL LOW (ref 8.9–10.3)
Chloride: 104 mmol/L (ref 98–111)
Creatinine, Ser: 0.71 mg/dL (ref 0.44–1.00)
GFR calc Af Amer: >60 mL/min (ref >60)
GFR calc non Af Amer: >60 mL/min (ref >60)
Glucose, Bld: 86 mg/dL (ref 70–99)
Potassium: 3 mmol/L — ABNORMAL LOW (ref 3.5–5.1)
Sodium: 140 mmol/L (ref 135–145)

## 2019-07-04 LAB — SARS CORONAVIRUS 2 (TAT 6-24 HRS): SARS Coronavirus 2: NEGATIVE

## 2019-07-04 LAB — MAGNESIUM: Magnesium: 2.3 mg/dL (ref 1.7–2.4)

## 2019-07-04 MED ORDER — POTASSIUM CHLORIDE CRYS ER 20 MEQ PO TBCR
40.0000 meq | EXTENDED_RELEASE_TABLET | Freq: Once | ORAL | Status: AC
  Administered 2019-07-04: 40 meq via ORAL
  Filled 2019-07-04: qty 2

## 2019-07-04 MED ORDER — FUROSEMIDE 10 MG/ML IJ SOLN
80.0000 mg | Freq: Once | INTRAMUSCULAR | Status: AC
  Administered 2019-07-04: 80 mg via INTRAVENOUS
  Filled 2019-07-04: qty 8

## 2019-07-04 NOTE — Progress Notes (Addendum)
The patient is planned for TEE, PVI ablation tomorrow Using [employer] # [id number] We discussed TEE, rational for the procedure prior to her ablation,potential risks and benefits We discussed general anesthesia is used and there are risks associated with it We discussed the PVI ablation procedure, potential benefits and risks associated with it Risks discussed: stroke, bleeding, vascular damage, tamponade, perforation, damage to the esophagus, lungs, and other structures, pulmonary vein stenosis, worsening renal function, and death.  Discussed post procedure care/instructions as well  The patient;s questions were answered and she is agreeable to proceed She is instructed not to eat after midnight.   The patient has been a month without Eliquis at home She has been accepted to assistance program, case manager confirmed receipt of drug at home, will have access to drug for a year. I have discussed importance of her medicines with her, particularly  Eliquis and stroke risk reduction.    Tommye Standard, PA-C

## 2019-07-04 NOTE — Progress Notes (Signed)
   Called by nursing staff for hypotension.  SBP in 70s   Insturcucted to hold lasix and metoprolol.   K 3.0 Give 40 meq now and another 40 meq in 2 hours.   Vianka Ertel NP-C  9:44 AM

## 2019-07-04 NOTE — Progress Notes (Signed)
Patient on Amiodarone drip and othe med to control HR see epic

## 2019-07-04 NOTE — Consult Note (Addendum)
Cardiology Consultation:   Patient ID: Megan Keith MRN: [id number]; DOB: [birth year]  Admit date: 07/03/2019 Date of Consult: 07/04/2019  Primary Care Provider: Kerin Perna, NP Primary Cardiologist: Glori Bickers, MD  Primary Electrophysiologist:  None    Patient Profile:   Megan Keith is a 58 y.o. 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 Haroldine Laws.  History of Present Illness:   Megan Keith was discovered with NICM (15% and 35% by various echos) felt to be 2/2 PVCs back in 2018, cath at that time without obstructive CAD and started on amiodarone for her PVCs, and dig/spironolactone for her CM as her BP would tolerate.  Megan Keith developed AFib  This year, July found during a HF exacerbation hospitalization, as well a hyperthyroidism.  EF by TEE was 10%, Megan Keith was treated with amio initially (not sent home on amio) and thyroid treated with methimazole and prednisone  Follow up monitor noted Zio patch 8/20 - NSR avg HR 75 - 13 runs NSVT longest 14 beats - 59 runs SVT longest 9.4 seconds - PVC burden 4.8%  Megan Keith was unfortunately admitted to [facility] with COVID 10/14-17/2020 associated gastroenteritis as well as rapid AFib.  Treated with IV amio, dig.  Rate controlled, remained in AF at time of discharge, not on amiodarone  Seen in f/u at Megan HF clinic yesterday, found volume OL and in rapid AFib, admitted to [facility]. Started on amiodarone gtt, IV lasix and remains on her home dig  EP is asked to weigh in on rate/rhythm control strategies. In d/w Dr. Haroldine Laws, Megan Keith is known to have medicine non-compliance, and not felt to be a Tikosyn candidate 2/2 this.  Megan Keith is feeling better today, tired, less aware of her palpitations, no rest SOB  LABS K+ 3.7 > 3.0 BUN/Creat 13/0.71 WBC 13.3 H/H 12/40 Plts 420 TSH  0.354 Free T3 3.7 Free T4 1.35  COVID negative    AFib hx Dx July 2020  AAD hx Amiodarone 03/2017 for PVCs stopped July 2019 with hyperthyroidism A/c Eliquis  Current rate/rhythm meds amio gtt was bolused, loaded >> 30mg /hr Dig 0.125mg  daily  Heart Pathway Score:     Past Medical History:  Diagnosis Date  . Abnormal TSH   . Anemia   . Arthritis    "knees, hands" (04/15/2017)  . Breast cancer ([place])   . Chronic knee pain   . Chronic systolic CHF (congestive heart failure) ([place])   . COVID-19   . Does not have health insurance   . Frequent PVCs   . Hyperthyroidism 03/05/2019  . Hyperthyroidism 03/05/2019  . 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 NICM (nonischemic cardiomyopathy) ([place])    a. ? PVC cardiomyopathy. right and left heart catheterization on 04/15/17, no CAD. Fick output/index 5.28/3.34.  . Noncompliance    a. episodic noncompliance.  . Pneumonia 06/2016    Past Surgical History:  Procedure Laterality Date  . BREAST CYST EXCISION Left   . CARDIOVERSION N/A 03/09/2019   Procedure: CARDIOVERSION;  Surgeon: Jolaine Artist, MD;  Location: [facility] ENDOSCOPY;  Service: Cardiovascular;  Laterality: N/A;  . CORONARY/GRAFT ANGIOGRAPHY N/A 04/15/2017   Procedure: Remus Blake ANGIOGRAPHY;  Surgeon: Nelva Bush, MD;  Location: [place] CV LAB;  Service: Cardiovascular;  Laterality: N/A;  . RIGHT HEART CATH N/A 04/15/2017   Procedure: RIGHT HEART CATH;  Surgeon: Nelva Bush, MD;  Location: [place] CV LAB;  Service: Cardiovascular;  Laterality: N/A;  . TEE WITHOUT CARDIOVERSION N/A 03/09/2019   Procedure: TRANSESOPHAGEAL ECHOCARDIOGRAM (TEE);  Surgeon: Jolaine Artist, MD;  Location: [facility] ENDOSCOPY;  Service: Cardiovascular;  Laterality: N/A;  . TUBAL LIGATION       Home Medications:  Prior to Admission medications   Medication Sig Start Date End Date Taking? Authorizing Provider  acetaminophen (TYLENOL) 650 MG CR tablet Take 650 mg by  mouth every 8 (eight) hours as needed for pain.   Yes [provider]  anastrozole (ARIMIDEX) 1 MG tablet Take 1 mg by mouth daily.   Yes [provider]  Ascorbic Acid (VITAMIN C) 1000 MG tablet Take 1,000 mg by mouth daily.   Yes [provider]  Camphor-Eucalyptus-Menthol (EQ VAPORIZING RUB EX) Apply 1 application topically daily as needed (for congestion,pain).   Yes [provider]  Cholecalciferol (D3-1000) 25 MCG (1000 UT) tablet Take 1,000 Units by mouth daily.   Yes [provider]  Cyanocobalamin (VITAMIN B 12) 500 MCG TABS Take 1,000 mcg by mouth daily.    Yes [provider]  digoxin (LANOXIN) 0.125 MG tablet Take 1 tablet (0.125 mg total) by mouth daily. 06/09/19 06/08/20 Yes Thurnell Lose, MD  loperamide (IMODIUM) 2 MG capsule Take 2 capsules (4 mg total) by mouth every 6 (six) hours as needed for diarrhea or loose stools. 06/09/19  Yes Thurnell Lose, MD  losartan (COZAAR) 25 MG tablet Take 12.5 mg by mouth at bedtime.   Yes [provider]  methimazole (TAPAZOLE) 10 MG tablet Take 1 tablet (10 mg total) by mouth daily. 05/07/19  Yes Bensimhon, Shaune Pascal, MD  metoprolol succinate (TOPROL-XL) 25 MG 24 hr tablet Take 1 tablet (25 mg total) by mouth daily. Patient taking differently: Take 25 mg by mouth 2 (two) times daily.  05/07/19  Yes Bensimhon, Shaune Pascal, MD  predniSONE (DELTASONE) 10 MG tablet Take 1 tablet (10 mg total) by mouth daily with breakfast. 05/07/19  Yes Bensimhon, Shaune Pascal, MD    Inpatient Medications: Scheduled Meds: . apixaban  5 mg Oral BID  . digoxin  0.125 mg Oral Daily  . furosemide  80 mg Intravenous Once  . losartan  12.5 mg Oral QHS  . methimazole  10 mg Oral Daily  . potassium chloride  40 mEq Oral Once  . predniSONE  10 mg Oral Q breakfast  . sodium chloride flush  3 mL Intravenous Q12H   Continuous Infusions: . sodium chloride    . amiodarone 30 mg/hr (07/04/19 0013)   PRN Meds:  sodium chloride, acetaminophen, sodium chloride flush  Allergies:   No Known Allergies  Social History:   Social History   Socioeconomic History  . Marital status: Single    Spouse name: Not on file  . Number of children: Not on file  . Years of education: Not on file  . Highest education level: Not on file  Occupational History  . Not on file  Social Needs  . Financial resource strain: Not on file  . Food insecurity    Worry: Patient refused    Inability: Patient refused  . Transportation needs    Medical: Patient refused    Non-medical: Patient refused  Tobacco Use  . Smoking status: Never Smoker  . Smokeless tobacco: Never Used  Substance and Sexual Activity  . Alcohol use: Yes    Comment: 04/15/2017 "might have a couple beers q  couple months"  . Drug use: No  . Sexual activity: Never  Lifestyle  . Physical activity    Days per week: Not on file    Minutes per session: Not on file  . Stress: Only a little  Relationships  . Social Herbalist on phone: Not on file    Gets together: Not on file    Attends religious service: Not on file    Active member of club or organization: Not on file    Attends meetings of clubs or organizations: Not on file    Relationship status: Not on file  . Intimate partner violence    Fear of current or ex partner: Not on file    Emotionally abused: Not on file    Physically abused: Not on file    Forced sexual activity: Not on file  Other Topics Concern  . Not on file  Social History Narrative  . Not on file    Family History:   Family History  Problem Relation Age of Onset  . Hypertension Mother      ROS:  Please see Megan history of present illness.  All other ROS reviewed and negative.     Physical Exam/Data:   Vitals:   07/04/19 0453 07/04/19 0821 07/04/19 0941 07/04/19 1047  BP: 91/80 91/77 (!) 89/72 100/76  Pulse: 86 (!) 101 (!) 124 (!) 113  Resp: 20 16  18   Temp: 98.1 F (36.7 C) 98.3 F (36.8 C)   98.1 F (36.7 C)  TempSrc: Oral Oral  Oral  SpO2: 100% 99%  100%  Weight: 62.9 kg     Height:        Intake/Output Summary (Last 24 hours) at 07/04/2019 1229 Last data filed at 07/04/2019 0700 Gross per 24 hour  Intake 300 ml  Output 1900 ml  Net -1600 ml   Last 3 Weights 07/04/2019 07/03/2019 07/03/2019  Weight (lbs) 138 lb 11.2 oz 143 lb 11.8 oz 146 lb  Weight (kg) 62.914 kg 65.2 kg 66.225 kg     Body mass index is 31.1 kg/m.  General:  Well nourished, well developed, in no acute distress HEENT: normal Lymph: no adenopathy Neck: + JVD, 7 Endocrine:  No thryomegaly Vascular: No carotid bruits Cardiac:  irreg-irreg, tachycardic no murmurs, gallops or rubs noted Lungs:  CTA b/l, no wheezing, rhonchi or rales  Abd: soft, nontender  Ext: no edema Musculoskeletal:  No deformities Skin: warm and dry  Neuro:   No gross focal abnormalities noted Psych:  Normal affect   EKG:  Megan EKG was personally reviewed and demonstrates:    Afib 151bpm, PVCs  Telemetry:  Telemetry was personally reviewed and demonstrates:   AFib today 100-110's   Relevant CV Studies:  04/03/2019: TTE IMPRESSIONS 1. Megan left ventricle has severely reduced systolic function, with an ejection fraction of 25-30%. Megan cavity size was normal. Left ventricular diastolic function could not be evaluated secondary to atrial fibrillation. Left ventricular diffuse  hypokinesis.  2. Megan average left ventricular global longitudinal strain is -9.6 %.  3. Megan right ventricle has normal systolic function. Megan cavity was normal. There is no increase in right ventricular wall thickness.  4. Left atrial size was severely dilated.  (82mm)  5. There is mild to moderate mitral annular calcification present.  6. Megan aorta is normal in size and structure.   04/15/17: R/LHC Conclusions: 1. Mildly moderately elevated left heart filling pressures. 2. Mild pulmonary hypertension. 3. Normal right heart  filling pressure. 4.  Normal Fick cardiac output/index. 5. No angiographically significant coronary artery disease.   Echo 03/2017 EF 10-15% Echo 07/2017 EF 25-30%  Echo 11/2017 EF 25-30% Echo 06/2018: EF ~30% (per Dr Haroldine Laws) Echo 8/20 EF 25-30%  Laboratory Data:  High Sensitivity Troponin:   Recent Labs  Lab 06/06/19 0620  TROPONINIHS 14     Chemistry Recent Labs  Lab 07/03/19 1609 07/04/19 0342  NA 139 140  K 3.7 3.0*  CL 105 104  CO2 23 24  GLUCOSE 128* 86  BUN 14 13  CREATININE 0.94 0.71  CALCIUM 9.0 8.7*  GFRNONAA >60 >60  GFRAA >60 >60  ANIONGAP 11 12    Recent Labs  Lab 07/03/19 1609  PROT 6.8  ALBUMIN 4.0  AST 24  ALT 59*  ALKPHOS 108  BILITOT 0.8   Hematology Recent Labs  Lab 07/03/19 1609  WBC 13.3*  RBC 4.23  HGB 12.6  HCT 40.1  MCV 94.8  MCH 29.8  MCHC 31.4  RDW 14.7  PLT 420*   BNPNo results for input(s): BNP, PROBNP in Megan last 168 hours.  DDimer No results for input(s): DDIMER in Megan last 168 hours.   Radiology/Studies:  No results found.  Assessment and Plan:   1. Persistent Afib     CHA2DS2Vasc is 2, on Eliquis, appropriately dosed (though pti with intermittent noncompliance)  Rates proving difficult to control back in amiodarone Off BB with acute CHF exacerbation  Agree no good AAD options Ablative strategies would be next  1.PVI ablation may be considered, Megan Keith has only been back in afib about a month or so presumably.  LA 68mm upper end of desirable for PVI. In brief discussion with Dr. Joylene Grapes, this would be 1st choice  2. Pace and AV node ablation This feels more straight forward, ? CRT-P (or D) and ablate AV node, though Megan Keith is quite young  I introduced Megan idea of PVI ablation,  AV node ablation/pacing (pacer dependence if we went that route).  Megan Keith is agreeable to consider either, whatever we think is best.   2. PVCs     Did well with amiodarone before it had to be stopped      Uncertain what strategy for these going forward   3. CM     Initially felt to be PVC mediated     Worsened by rapid AF     C/w AHF team management  4. Noncompliance     I discussed with Megan patient at length importance of her medicines     Megan Keith becomes tearful and says that it is on Megan Eliquis that Megan Keith can not afford ($500/month) and this Megan only medicines Megan Keith skips.      Megan Keith tells me that Megan Keith has not taken it for a month, though her grandchildren helped fill out paperwork for an assistance program and Megan Keith was accepted and thinks Megan medicine got to her house today.     Megan Keith is back ion Eliquis here, 1st dose last night  Megan Keith will get a total of 4 doses including tomorrow AM dose if we proceed with TEE/PVI ablation tomorrow          Dr. Rayann Heman will see later this afternoon    For questions or updates, please contact [place] Please consult [email or web address] for contact info under     Signed, Baldwin Jamaica, PA-C  07/04/2019 12:29 PM    I have seen, examined Megan patient, and reviewed Megan above assessment and  plan.  Changes to above are made where necessary.  On exam, tachycardic irregular rhythm. Megan patient has symptomatic, recurrent persistent atrial fibrillation. Megan Keith has failed medical therapy with amiodarone.  Megan Keith has severe LA enlargement as well as prior XRT to Megan L chest, which may have caused some degree of atriopathy. Chads2vasc score is 2.  Megan Keith is anticoagulated with eliquis but has not been recently complaint.  Megan importance of compliance with anticoagulation was stressed today. Her medicine options are very limited.  Megan Keith has not previously tolerated amiodarone and has slightly lengthened qt which limits her options otherwise.   Her options are AV nodal ablation/ PPM vs ablation.  Risk, benefits, and alternatives to each were discussed today.  Given her young age, I would favor an attempt at ablation, thought I worry that her anticipated success is reduced. Megan Keith wishes to proceed.  We will therefore proceed with  catheter ablation tomorrow.  Megan Keith will require TEE prior to Megan procedure..    Co Sign: Thompson Grayer, MD 07/04/2019

## 2019-07-04 NOTE — Care Management (Addendum)
07-04-19 1630 CM received referral for Eliquis assistance. CM called [employer] regarding assistance in speaking with daughter Alinda Sierras to determine if patient filled out applications and if was approved. Daughter will have to contact her daughter to confirm if paperwork was filled out. CM did call the [facility] to make hospital follow up appointment and to see if assistance was started at the clinic-CM had to leave a voicemail- will await call back. No further needs at this time. Bethena Roys, RN,BSN Case Manager [phone]     07-04-19 [phone] Confirmed that Eliquis is at the home- approved for year supply free. CM will await call from [place] for follow up appointment. Bethena Roys , RN,BSN Case Manager [phone]

## 2019-07-04 NOTE — Progress Notes (Signed)
Advanced Heart Failure Rounding Note   Subjective:    Feels much better today. AF rate down from 150 -> 100-105 on IV amio  Breathing much better. Lasix held this am due to low BP. Denies palpitations, no SOB, orthopnea or PND.   On Eliquis. No bleeding.   Objective:   Weight Range:  Vital Signs:   Temp:  [98.1 F (36.7 C)-98.6 F (37 C)] 98.1 F (36.7 C) (11/11 1047) Pulse Rate:  [86-154] 113 (11/11 1047) Resp:  [16-20] 18 (11/11 1047) BP: (89-113)/(63-89) 100/76 (11/11 1047) SpO2:  [98 %-100 %] 100 % (11/11 1047) Weight:  [62.9 kg-66.2 kg] 62.9 kg (11/11 0453) Last BM Date: 07/03/19  Weight change: Filed Weights   07/03/19 1703 07/04/19 0453  Weight: 65.2 kg 62.9 kg    Intake/Output:   Intake/Output Summary (Last 24 hours) at 07/04/2019 1155 Last data filed at 07/04/2019 0700 Gross per 24 hour  Intake 300 ml  Output 1900 ml  Net -1600 ml     Physical Exam: General:  Sitting up in bed No resp difficulty HEENT: normal Neck: supple. JVP 8-9. Carotids 2+ bilat; no bruits. No lymphadenopathy or thryomegaly appreciated. Cor: PMI nondisplaced. Iregular rate & rhythm. No rubs, gallops or murmurs. Lungs: clear Abdomen: soft, nontender, nondistended. No hepatosplenomegaly. No bruits or masses. Good bowel sounds. Extremities: no cyanosis, clubbing, rash, edema Neuro: alert & orientedx3, cranial nerves grossly intact. moves all 4 extremities w/o difficulty. Affect pleasant  Telemetry: AF 100-105 Personally reviewed   Labs: Basic Metabolic Panel: Recent Labs  Lab 07/03/19 1609 07/04/19 0342  NA 139 140  K 3.7 3.0*  CL 105 104  CO2 23 24  GLUCOSE 128* 86  BUN 14 13  CREATININE 0.94 0.71  CALCIUM 9.0 8.7*  MG  --  2.3    Liver Function Tests: Recent Labs  Lab 07/03/19 1609  AST 24  ALT 59*  ALKPHOS 108  BILITOT 0.8  PROT 6.8  ALBUMIN 4.0   No results for input(s): LIPASE, AMYLASE in the last 168 hours. No results for input(s): AMMONIA in  the last 168 hours.  CBC: Recent Labs  Lab 07/03/19 1609  WBC 13.3*  HGB 12.6  HCT 40.1  MCV 94.8  PLT 420*    Cardiac Enzymes: No results for input(s): CKTOTAL, CKMB, CKMBINDEX, TROPONINI in the last 168 hours.  BNP: BNP (last 3 results) Recent Labs    06/06/19 0620 06/07/19 0105 06/08/19 0044  BNP 1,146.8* 1,294.9* 893.6*    ProBNP (last 3 results) No results for input(s): PROBNP in the last 8760 hours.    Other results:  Imaging:  No results found.   Medications:     Scheduled Medications: . apixaban  5 mg Oral BID  . digoxin  0.125 mg Oral Daily  . losartan  12.5 mg Oral QHS  . methimazole  10 mg Oral Daily  . potassium chloride  40 mEq Oral Once  . predniSONE  10 mg Oral Q breakfast  . sodium chloride flush  3 mL Intravenous Q12H     Infusions: . sodium chloride    . amiodarone 30 mg/hr (07/04/19 0013)     PRN Medications:  sodium chloride, acetaminophen, sodium chloride flush   Assessment/Plan:   1. Persistent Atrial Fibrillation w/ RVR: -rate in the 150s on admit now 100-105 on IV amio - s/p previous DC-CV in 7/20. Recurrent AF with COVID  - previously was on amio for PVCs but developed hyperthyroidism  -Eliquis 5 mg  bid -Continue Toprol XL 25 mg daily  -Continue digoxin 0.125 mg  - Not good AA candidate - Discussed with EP regarding options of PVI vs AVN ablation they will see today (thanks). Will need TEE prior to procedure to exclude clot as she has been off Eliquis intermittently   2.Acute onChronic systolic [id number], normal cors in 03/2017. 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 03/2017 EF 15-20% Echo 07/28/17 EF 25-30%  - Echo 11/2017: EF 25-30%.  - Echo 06/2018 EF ~30% - Echo 7/20 in setting of PAF with RVR. EF 10% - Echo 8/20 EF 25-30% - Volume overloaded in the setting of afib w/ RVR - Improving. Give one more dose IV lasix today - Continue losartan at 12.5 mg  - Continue Toprol 25 bid - Unable to get cMRI due to lack of  insurance.  - Suspecther CM is likely tachy-mediated (vs PVC).   3. Hyperthyroidism - TSH low normal today. Check T3 and T4 - Continue methimazole   4.H/oFrequent PVCs - Zio patch8/20with 5% PVC burden  - SuspecttachymediatedCM. Amio previouslystopped due to hyperthyroidism. - Appreciate EP input  5. Left Breast Cancer. -treated w/XRT  6. Medication noncompliance -language barrier plays a role   Length of Stay: 1   Glori Bickers MD 07/04/2019, 11:55 AM  Advanced Heart Failure Team Pager [phone] (M-F; [place])  Please contact [facility] for night-coverage after hours (4p -7a ) and weekends on [email or web address]

## 2019-07-04 NOTE — H&P (View-Only) (Signed)
Cardiology Consultation:   Patient ID: Megan Keith MRN: [id number]; DOB: [birth year]  Admit date: 07/03/2019 Date of Consult: 07/04/2019  Primary Care Provider: Kerin Perna, NP Primary Cardiologist: Glori Bickers, MD  Primary Electrophysiologist:  None    Patient Profile:   Megan Keith is a 58 y.o. female with a hx of NICM felt 2/2 PVCs,  chronic CHF (systolic), hyperthyroidism, and  fairly new AFib, who is being seen today for the evaluation of AFib management at the request of Dr. Haroldine Laws.  History of Present Illness:   Megan Keith was discovered with NICM (15% and 35% by various echos) felt to be 2/2 PVCs back in 2018, cath at that time without obstructive CAD and started on amiodarone for her PVCs, and dig/spironolactone for her CM as her BP would tolerate.  She developed AFib  This year, July found during a HF exacerbation hospitalization, as well a hyperthyroidism.  EF by TEE was 10%, she was treated with amio initially (not sent home on amio) and thyroid treated with methimazole and prednisone  Follow up monitor noted Zio patch 8/20 - NSR avg HR 75 - 13 runs NSVT longest 14 beats - 59 runs SVT longest 9.4 seconds - PVC burden 4.8%  She was unfortunately admitted to [facility] with COVID 10/14-17/2020 associated gastroenteritis as well as rapid AFib.  Treated with IV amio, dig.  Rate controlled, remained in AF at time of discharge, not on amiodarone  Seen in f/u at the HF clinic yesterday, found volume OL and in rapid AFib, admitted to [facility]. Started on amiodarone gtt, IV lasix and remains on her home dig  EP is asked to weigh in on rate/rhythm control strategies. In d/w Dr. Haroldine Laws, the pt is known to have medicine non-compliance, and not felt to be a Tikosyn candidate 2/2 this.  She is feeling better today, tired, less aware of her palpitations, no rest SOB  LABS K+ 3.7 > 3.0 BUN/Creat 13/0.71 WBC 13.3 H/H 12/40 Plts 420 TSH  0.354 Free T3 3.7 Free T4 1.35  COVID negative    AFib hx Dx July 2020  AAD hx Amiodarone 03/2017 for PVCs stopped July 2019 with hyperthyroidism A/c Eliquis  Current rate/rhythm meds amio gtt was bolused, loaded >> 30mg /hr Dig 0.125mg  daily  Heart Pathway Score:     Past Medical History:  Diagnosis Date  . Abnormal TSH   . Anemia   . Arthritis    "knees, hands" (04/15/2017)  . Breast cancer ([place])   . Chronic knee pain   . Chronic systolic CHF (congestive heart failure) ([place])   . COVID-19   . Does not have health insurance   . Frequent PVCs   . Hyperthyroidism 03/05/2019  . Hyperthyroidism 03/05/2019  . 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 ([place]) 04/15/2017  . NICM (nonischemic cardiomyopathy) ([place])    a. ? PVC cardiomyopathy. right and left heart catheterization on 04/15/17, no CAD. Fick output/index 5.28/3.34.  . Noncompliance    a. episodic noncompliance.  . Pneumonia 06/2016    Past Surgical History:  Procedure Laterality Date  . BREAST CYST EXCISION Left   . CARDIOVERSION N/A 03/09/2019   Procedure: CARDIOVERSION;  Surgeon: Jolaine Artist, MD;  Location: [facility] ENDOSCOPY;  Service: Cardiovascular;  Laterality: N/A;  . CORONARY/GRAFT ANGIOGRAPHY N/A 04/15/2017   Procedure: Remus Blake ANGIOGRAPHY;  Surgeon: Nelva Bush, MD;  Location: [place] CV LAB;  Service: Cardiovascular;  Laterality: N/A;  . RIGHT HEART CATH N/A 04/15/2017   Procedure: RIGHT HEART CATH;  Surgeon: Nelva Bush, MD;  Location: [place] CV LAB;  Service: Cardiovascular;  Laterality: N/A;  . TEE WITHOUT CARDIOVERSION N/A 03/09/2019   Procedure: TRANSESOPHAGEAL ECHOCARDIOGRAM (TEE);  Surgeon: Jolaine Artist, MD;  Location: [facility] ENDOSCOPY;  Service: Cardiovascular;  Laterality: N/A;  . TUBAL LIGATION       Home Medications:  Prior to Admission medications   Medication Sig Start Date End Date Taking? Authorizing Provider  acetaminophen (TYLENOL) 650 MG CR tablet Take 650 mg by  mouth every 8 (eight) hours as needed for pain.   Yes [provider]  anastrozole (ARIMIDEX) 1 MG tablet Take 1 mg by mouth daily.   Yes [provider]  Ascorbic Acid (VITAMIN C) 1000 MG tablet Take 1,000 mg by mouth daily.   Yes [provider]  Camphor-Eucalyptus-Menthol (EQ VAPORIZING RUB EX) Apply 1 application topically daily as needed (for congestion,pain).   Yes [provider]  Cholecalciferol (D3-1000) 25 MCG (1000 UT) tablet Take 1,000 Units by mouth daily.   Yes [provider]  Cyanocobalamin (VITAMIN B 12) 500 MCG TABS Take 1,000 mcg by mouth daily.    Yes [provider]  digoxin (LANOXIN) 0.125 MG tablet Take 1 tablet (0.125 mg total) by mouth daily. 06/09/19 06/08/20 Yes Thurnell Lose, MD  loperamide (IMODIUM) 2 MG capsule Take 2 capsules (4 mg total) by mouth every 6 (six) hours as needed for diarrhea or loose stools. 06/09/19  Yes Thurnell Lose, MD  losartan (COZAAR) 25 MG tablet Take 12.5 mg by mouth at bedtime.   Yes [provider]  methimazole (TAPAZOLE) 10 MG tablet Take 1 tablet (10 mg total) by mouth daily. 05/07/19  Yes Bensimhon, Shaune Pascal, MD  metoprolol succinate (TOPROL-XL) 25 MG 24 hr tablet Take 1 tablet (25 mg total) by mouth daily. Patient taking differently: Take 25 mg by mouth 2 (two) times daily.  05/07/19  Yes Bensimhon, Shaune Pascal, MD  predniSONE (DELTASONE) 10 MG tablet Take 1 tablet (10 mg total) by mouth daily with breakfast. 05/07/19  Yes Bensimhon, Shaune Pascal, MD    Inpatient Medications: Scheduled Meds: . apixaban  5 mg Oral BID  . digoxin  0.125 mg Oral Daily  . furosemide  80 mg Intravenous Once  . losartan  12.5 mg Oral QHS  . methimazole  10 mg Oral Daily  . potassium chloride  40 mEq Oral Once  . predniSONE  10 mg Oral Q breakfast  . sodium chloride flush  3 mL Intravenous Q12H   Continuous Infusions: . sodium chloride    . amiodarone 30 mg/hr (07/04/19 0013)   PRN Meds:  sodium chloride, acetaminophen, sodium chloride flush  Allergies:   No Known Allergies  Social History:   Social History   Socioeconomic History  . Marital status: Single    Spouse name: Not on file  . Number of children: Not on file  . Years of education: Not on file  . Highest education level: Not on file  Occupational History  . Not on file  Social Needs  . Financial resource strain: Not on file  . Food insecurity    Worry: Patient refused    Inability: Patient refused  . Transportation needs    Medical: Patient refused    Non-medical: Patient refused  Tobacco Use  . Smoking status: Never Smoker  . Smokeless tobacco: Never Used  Substance and Sexual Activity  . Alcohol use: Yes    Comment: 04/15/2017 "might have a couple beers q  couple months"  . Drug use: No  . Sexual activity: Never  Lifestyle  . Physical activity    Days per week: Not on file    Minutes per session: Not on file  . Stress: Only a little  Relationships  . Social Herbalist on phone: Not on file    Gets together: Not on file    Attends religious service: Not on file    Active member of club or organization: Not on file    Attends meetings of clubs or organizations: Not on file    Relationship status: Not on file  . Intimate partner violence    Fear of current or ex partner: Not on file    Emotionally abused: Not on file    Physically abused: Not on file    Forced sexual activity: Not on file  Other Topics Concern  . Not on file  Social History Narrative  . Not on file    Family History:   Family History  Problem Relation Age of Onset  . Hypertension Mother      ROS:  Please see the history of present illness.  All other ROS reviewed and negative.     Physical Exam/Data:   Vitals:   07/04/19 0453 07/04/19 0821 07/04/19 0941 07/04/19 1047  BP: 91/80 91/77 (!) 89/72 100/76  Pulse: 86 (!) 101 (!) 124 (!) 113  Resp: 20 16  18   Temp: 98.1 F (36.7 C) 98.3 F (36.8 C)   98.1 F (36.7 C)  TempSrc: Oral Oral  Oral  SpO2: 100% 99%  100%  Weight: 62.9 kg     Height:        Intake/Output Summary (Last 24 hours) at 07/04/2019 1229 Last data filed at 07/04/2019 0700 Gross per 24 hour  Intake 300 ml  Output 1900 ml  Net -1600 ml   Last 3 Weights 07/04/2019 07/03/2019 07/03/2019  Weight (lbs) 138 lb 11.2 oz 143 lb 11.8 oz 146 lb  Weight (kg) 62.914 kg 65.2 kg 66.225 kg     Body mass index is 31.1 kg/m.  General:  Well nourished, well developed, in no acute distress HEENT: normal Lymph: no adenopathy Neck: + JVD, 7 Endocrine:  No thryomegaly Vascular: No carotid bruits Cardiac:  irreg-irreg, tachycardic no murmurs, gallops or rubs noted Lungs:  CTA b/l, no wheezing, rhonchi or rales  Abd: soft, nontender  Ext: no edema Musculoskeletal:  No deformities Skin: warm and dry  Neuro:   No gross focal abnormalities noted Psych:  Normal affect   EKG:  The EKG was personally reviewed and demonstrates:    Afib 151bpm, PVCs  Telemetry:  Telemetry was personally reviewed and demonstrates:   AFib today 100-110's   Relevant CV Studies:  04/03/2019: TTE IMPRESSIONS 1. The left ventricle has severely reduced systolic function, with an ejection fraction of 25-30%. The cavity size was normal. Left ventricular diastolic function could not be evaluated secondary to atrial fibrillation. Left ventricular diffuse  hypokinesis.  2. The average left ventricular global longitudinal strain is -9.6 %.  3. The right ventricle has normal systolic function. The cavity was normal. There is no increase in right ventricular wall thickness.  4. Left atrial size was severely dilated.  (82mm)  5. There is mild to moderate mitral annular calcification present.  6. The aorta is normal in size and structure.   04/15/17: R/LHC Conclusions: 1. Mildly moderately elevated left heart filling pressures. 2. Mild pulmonary hypertension. 3. Normal right heart  filling pressure. 4.  Normal Fick cardiac output/index. 5. No angiographically significant coronary artery disease.   Echo 03/2017 EF 10-15% Echo 07/2017 EF 25-30%  Echo 11/2017 EF 25-30% Echo 06/2018: EF ~30% (per Dr Haroldine Laws) Echo 8/20 EF 25-30%  Laboratory Data:  High Sensitivity Troponin:   Recent Labs  Lab 06/06/19 0620  TROPONINIHS 14     Chemistry Recent Labs  Lab 07/03/19 1609 07/04/19 0342  NA 139 140  K 3.7 3.0*  CL 105 104  CO2 23 24  GLUCOSE 128* 86  BUN 14 13  CREATININE 0.94 0.71  CALCIUM 9.0 8.7*  GFRNONAA >60 >60  GFRAA >60 >60  ANIONGAP 11 12    Recent Labs  Lab 07/03/19 1609  PROT 6.8  ALBUMIN 4.0  AST 24  ALT 59*  ALKPHOS 108  BILITOT 0.8   Hematology Recent Labs  Lab 07/03/19 1609  WBC 13.3*  RBC 4.23  HGB 12.6  HCT 40.1  MCV 94.8  MCH 29.8  MCHC 31.4  RDW 14.7  PLT 420*   BNPNo results for input(s): BNP, PROBNP in the last 168 hours.  DDimer No results for input(s): DDIMER in the last 168 hours.   Radiology/Studies:  No results found.  Assessment and Plan:   1. Persistent Afib     CHA2DS2Vasc is 2, on Eliquis, appropriately dosed (though pti with intermittent noncompliance)  Rates proving difficult to control back in amiodarone Off BB with acute CHF exacerbation  Agree no good AAD options Ablative strategies would be next  1.PVI ablation may be considered, She has only been back in afib about a month or so presumably.  LA 80mm upper end of desirable for PVI. In brief discussion with Dr. Joylene Grapes, this would be 1st choice  2. Pace and AV node ablation This feels more straight forward, ? CRT-P (or D) and ablate AV node, though she is quite young  I introduced the idea of PVI ablation,  AV node ablation/pacing (pacer dependence if we went that route).  She is agreeable to consider either, whatever we think is best.   2. PVCs     Did well with amiodarone before it had to be stopped      Uncertain what strategy for these going forward   3. CM     Initially felt to be PVC mediated     Worsened by rapid AF     C/w AHF team management  4. Noncompliance     I discussed with the patient at length importance of her medicines     She becomes tearful and says that it is on the Eliquis that she can not afford ($500/month) and this the only medicines she skips.      She tells me that she has not taken it for a month, though her grandchildren helped fill out paperwork for an assistance program and she was accepted and thinks the medicine got to her house today.     She is back ion Eliquis here, 1st dose last night  She will get a total of 4 doses including tomorrow AM dose if we proceed with TEE/PVI ablation tomorrow          Dr. Rayann Heman will see later this afternoon    For questions or updates, please contact [place] Please consult [email or web address] for contact info under     Signed, Baldwin Jamaica, PA-C  07/04/2019 12:29 PM    I have seen, examined the patient, and reviewed the above assessment and  plan.  Changes to above are made where necessary.  On exam, tachycardic irregular rhythm. The patient has symptomatic, recurrent persistent atrial fibrillation. she has failed medical therapy with amiodarone.  She has severe LA enlargement as well as prior XRT to the L chest, which may have caused some degree of atriopathy. Chads2vasc score is 2.  she is anticoagulated with eliquis but has not been recently complaint.  The importance of compliance with anticoagulation was stressed today. Her medicine options are very limited.  She has not previously tolerated amiodarone and has slightly lengthened qt which limits her options otherwise.   Her options are AV nodal ablation/ PPM vs ablation.  Risk, benefits, and alternatives to each were discussed today.  Given her young age, I would favor an attempt at ablation, thought I worry that her anticipated success is reduced. She wishes to proceed.  We will therefore proceed with  catheter ablation tomorrow.  She will require TEE prior to the procedure..    Co Sign: Thompson Grayer, MD 07/04/2019

## 2019-07-04 NOTE — Progress Notes (Signed)
Patient BP soft this am,  HF NP notified, NP instruct to hold lasix and metoprolol. Patient denies pain, shortness of breath. Will continue to monitor the patient.

## 2019-07-05 ENCOUNTER — Inpatient Hospital Stay (HOSPITAL_COMMUNITY): Payer: Medicaid Other

## 2019-07-05 ENCOUNTER — Encounter (HOSPITAL_COMMUNITY)
Admission: AD | Disposition: A | Discharge status: Home / Self Care | Payer: Self-pay | Source: Ambulatory Visit | Attending: Internal Medicine

## 2019-07-05 ENCOUNTER — Encounter (HOSPITAL_COMMUNITY): Payer: Self-pay | Admitting: Internal Medicine

## 2019-07-05 ENCOUNTER — Inpatient Hospital Stay (HOSPITAL_COMMUNITY): Payer: Medicaid Other | Admitting: Anesthesiology

## 2019-07-05 DIAGNOSIS — I4819 Other persistent atrial fibrillation: Secondary | ICD-10-CM

## 2019-07-05 DIAGNOSIS — I340 Nonrheumatic mitral (valve) insufficiency: Secondary | ICD-10-CM

## 2019-07-05 DIAGNOSIS — I517 Cardiomegaly: Secondary | ICD-10-CM

## 2019-07-05 DIAGNOSIS — E0590 Thyrotoxicosis, unspecified without thyrotoxic crisis or storm: Secondary | ICD-10-CM

## 2019-07-05 HISTORY — PX: TEE WITHOUT CARDIOVERSION: SHX5443

## 2019-07-05 HISTORY — PX: CARDIOVERSION: EP1203

## 2019-07-05 LAB — URINALYSIS, ROUTINE W REFLEX MICROSCOPIC
Bacteria, UA: NONE SEEN
Bilirubin Urine: NEGATIVE
Glucose, UA: NEGATIVE mg/dL
Hgb urine dipstick: NEGATIVE
Ketones, ur: 5 mg/dL — AB
Leukocytes,Ua: NEGATIVE
Nitrite: NEGATIVE
Protein, ur: 30 mg/dL — AB
Specific Gravity, Urine: 1.02 (ref 1.005–1.030)
pH: 7 (ref 5.0–8.0)

## 2019-07-05 LAB — BASIC METABOLIC PANEL
Anion gap: 13 (ref 5–15)
BUN: 15 mg/dL (ref 6–20)
CO2: 23 mmol/L (ref 22–32)
Calcium: 9.2 mg/dL (ref 8.9–10.3)
Chloride: 105 mmol/L (ref 98–111)
Creatinine, Ser: 0.73 mg/dL (ref 0.44–1.00)
GFR calc Af Amer: >60 mL/min (ref >60)
GFR calc non Af Amer: >60 mL/min (ref >60)
Glucose, Bld: 93 mg/dL (ref 70–99)
Potassium: 4.1 mmol/L (ref 3.5–5.1)
Sodium: 141 mmol/L (ref 135–145)

## 2019-07-05 SURGERY — ECHOCARDIOGRAM, TRANSESOPHAGEAL
Anesthesia: General

## 2019-07-05 MED ORDER — ETOMIDATE 2 MG/ML IV SOLN
INTRAVENOUS | Status: DC | PRN
  Administered 2019-07-05: 18 mg via INTRAVENOUS

## 2019-07-05 MED ORDER — HEPARIN (PORCINE) IN NACL 1000-0.9 UT/500ML-% IV SOLN
INTRAVENOUS | Status: AC
  Filled 2019-07-05: qty 500

## 2019-07-05 MED ORDER — PHENYLEPHRINE HCL-NACL 10-0.9 MG/250ML-% IV SOLN
INTRAVENOUS | Status: DC | PRN
  Administered 2019-07-05: 30 ug/min via INTRAVENOUS
  Administered 2019-07-05: 50 ug/min via INTRAVENOUS

## 2019-07-05 MED ORDER — SODIUM CHLORIDE 0.9 % IV SOLN: INTRAVENOUS | Status: DC

## 2019-07-05 MED ORDER — METOPROLOL SUCCINATE ER 25 MG PO TB24
12.5000 mg | ORAL_TABLET | Freq: Two times a day (BID) | ORAL | Status: DC
  Administered 2019-07-06: 12.5 mg via ORAL
  Filled 2019-07-05 (×2): qty 1

## 2019-07-05 MED ORDER — GUAIFENESIN 100 MG/5ML PO SOLN
5.0000 mL | ORAL | Status: DC | PRN
  Administered 2019-07-05 – 2019-07-09 (×5): 100 mg via ORAL
  Filled 2019-07-05 (×5): qty 5

## 2019-07-05 MED ORDER — SUCCINYLCHOLINE CHLORIDE 200 MG/10ML IV SOSY
PREFILLED_SYRINGE | INTRAVENOUS | Status: DC | PRN
  Administered 2019-07-05: 100 mg via INTRAVENOUS

## 2019-07-05 MED ORDER — FUROSEMIDE 10 MG/ML IJ SOLN
40.0000 mg | Freq: Two times a day (BID) | INTRAMUSCULAR | Status: DC
  Administered 2019-07-05 – 2019-07-06 (×2): 40 mg via INTRAVENOUS
  Filled 2019-07-05 (×2): qty 4

## 2019-07-05 MED ORDER — PNEUMOCOCCAL VAC POLYVALENT 25 MCG/0.5ML IJ INJ
0.5000 mL | INJECTION | INTRAMUSCULAR | Status: AC
  Administered 2019-07-06: 0.5 mL via INTRAMUSCULAR
  Filled 2019-07-05: qty 0.5

## 2019-07-05 MED ORDER — METOPROLOL SUCCINATE ER 25 MG PO TB24: 12.5000 mg | ORAL_TABLET | Freq: Every day | ORAL | Status: DC

## 2019-07-05 MED ORDER — FUROSEMIDE 10 MG/ML IJ SOLN: 40.0000 mg | Freq: Two times a day (BID) | INTRAMUSCULAR | Status: DC

## 2019-07-05 MED ORDER — HEPARIN SODIUM (PORCINE) 1000 UNIT/ML IJ SOLN
INTRAMUSCULAR | Status: AC
  Filled 2019-07-05: qty 1

## 2019-07-05 SURGICAL SUPPLY — 3 items
BLANKET WARM UNDERBOD FULL ACC (MISCELLANEOUS) ×3 IMPLANT
PAD PRO RADIOLUCENT 2001M-C (PAD) ×3 IMPLANT
PATCH CARTO3 (PAD) IMPLANT

## 2019-07-05 NOTE — Progress Notes (Signed)
Advanced Heart Failure Rounding Note   Subjective:    Denies SOB this am. No orthopnea or PND. Nervious about ablation.  Remains in AF rates in 90s to low 100s  Objective:   Weight Range:  Vital Signs:   Temp:  [98.1 F (36.7 C)-99.5 F (37.5 C)] 99.5 F (37.5 C) (11/12 0910) Pulse Rate:  [69-131] 94 (11/12 0910) Resp:  [16-18] 18 (11/12 0910) BP: (82-100)/(59-80) 92/80 (11/12 0910) SpO2:  [94 %-100 %] 100 % (11/12 0910) Weight:  [62 kg] 62 kg (11/12 0500) Last BM Date: 07/03/19  Weight change: Filed Weights   07/03/19 1703 07/04/19 0453 07/05/19 0500  Weight: 65.2 kg 62.9 kg 62 kg    Intake/Output:   Intake/Output Summary (Last 24 hours) at 07/05/2019 1016 Last data filed at 07/04/2019 1550 Gross per 24 hour  Intake 426.8 ml  Output 1750 ml  Net -1323.2 ml     Physical Exam: General:  Well appearing. No resp difficulty HEENT: normal Neck: supple. JVP 6-7. Carotids 2+ bilat; no bruits. No lymphadenopathy or thryomegaly appreciated. Cor: PMI nondisplaced. Irregular rate & rhythm. No rubs, gallops or murmurs. Lungs: clear Abdomen: soft, nontender, nondistended. No hepatosplenomegaly. No bruits or masses. Good bowel sounds. Extremities: no cyanosis, clubbing, rash, edema Neuro: alert & orientedx3, cranial nerves grossly intact. moves all 4 extremities w/o difficulty. Affect pleasant   Telemetry: AF 90-110 Personally reviewed   Labs: Basic Metabolic Panel: Recent Labs  Lab 07/03/19 1609 07/04/19 0342 07/05/19 0518  NA 139 140 141  K 3.7 3.0* 4.1  CL 105 104 105  CO2 23 24 23   GLUCOSE 128* 86 93  BUN 14 13 15   CREATININE 0.94 0.71 0.73  CALCIUM 9.0 8.7* 9.2  MG  --  2.3  --     Liver Function Tests: Recent Labs  Lab 07/03/19 1609  AST 24  ALT 59*  ALKPHOS 108  BILITOT 0.8  PROT 6.8  ALBUMIN 4.0   No results for input(s): LIPASE, AMYLASE in the last 168 hours. No results for input(s): AMMONIA in the last 168 hours.  CBC: Recent Labs   Lab 07/03/19 1609  WBC 13.3*  HGB 12.6  HCT 40.1  MCV 94.8  PLT 420*    Cardiac Enzymes: No results for input(s): CKTOTAL, CKMB, CKMBINDEX, TROPONINI in the last 168 hours.  BNP: BNP (last 3 results) Recent Labs    06/06/19 0620 06/07/19 0105 06/08/19 0044  BNP 1,146.8* 1,294.9* 893.6*    ProBNP (last 3 results) No results for input(s): PROBNP in the last 8760 hours.    Other results:  Imaging: No results found.   Medications:     Scheduled Medications: . [MAR Hold] apixaban  5 mg Oral BID  . [MAR Hold] digoxin  0.125 mg Oral Daily  . [MAR Hold] losartan  12.5 mg Oral QHS  . [MAR Hold] methimazole  10 mg Oral Daily  . [MAR Hold] metoprolol succinate  12.5 mg Oral BID  . [MAR Hold] predniSONE  10 mg Oral Q breakfast  . [MAR Hold] sodium chloride flush  3 mL Intravenous Q12H    Infusions: . [MAR Hold] sodium chloride    . sodium chloride    . amiodarone 30 mg/hr (07/05/19 0145)    PRN Medications: [MAR Hold] sodium chloride, [MAR Hold] acetaminophen, [MAR Hold] sodium chloride flush   Assessment/Plan:   1. Persistent Atrial Fibrillation w/ RVR: -rate in the 150s on admit now 90-110 on IV amio - s/p previous DC-CV in  7/20. Recurrent AF with COVID  - previously was on amio for PVCs but developed hyperthyroidism  - on eliquis -Continue Toprol XL 25 mg daily  -Continue digoxin 0.125 mg  - Not good AA candidate - Discussed with EP. Planning PVI today. Appreciate their care.  2.Acute onChronic systolic [id number], normal cors in 03/2017. 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 03/2017 EF 15-20% Echo 07/28/17 EF 25-30%  - Echo 11/2017: EF 25-30%.  - Echo 06/2018 EF ~30% - Echo 7/20 in setting of PAF with RVR. EF 10% - Echo 8/20 EF 25-30% - Volume overload resolved. Holding lasix today - Continue losartan at 12.5 mg  - Continue Toprol 25 bid - Unable to get cMRI due to lack of insurance.  - Suspecther CM is likely tachy-mediated (vs PVC).   3.  Hyperthyroidism - TSH low normal today. Check T3 and T4 - Continue methimazole   4.H/oFrequent PVCs - Zio patch8/20with 5% PVC burden  - SuspecttachymediatedCM. Amio previouslystopped due to hyperthyroidism. - Plan as above. Appreciate EP input  5. Left Breast Cancer. -treated w/XRT  6. Medication noncompliance -language barrier plays a role   Length of Stay: 2   Glori Bickers MD 07/05/2019, 10:16 AM  Advanced Heart Failure Team Pager [phone] (M-F; [place])  Please contact [facility] for night-coverage after hours (4p -7a ) and weekends on [email or web address]

## 2019-07-05 NOTE — Anesthesia Preprocedure Evaluation (Signed)
Anesthesia Evaluation  Patient identified by MRN, date of birth, ID band Patient awake    Reviewed: Allergy & Precautions, NPO status , Patient's Chart, lab work & pertinent test results  Airway Mallampati: I  TM Distance: >3 FB Neck ROM: Full    Dental  (+) Partial Upper, Dental Advisory Given   Pulmonary pneumonia, resolved,    Pulmonary exam normal breath sounds clear to auscultation       Cardiovascular +CHF  + dysrhythmias Atrial Fibrillation  Rhythm:Regular Rate:Tachycardia  Dilated Cardiomyopathy Echo 04/03/2019 1. The left ventricle has severely reduced systolic function, with an ejection fraction of 25-30%. The cavity size was normal. Left ventricular diastolic function could not be evaluated secondary to atrial fibrillation. Left ventricular diffuse hypokinesis.  2. The average left ventricular global longitudinal strain is -9.6 %.  3. The right ventricle has normal systolic function. The cavity was normal. There is no increase in right ventricular wall thickness.  4. Left atrial size was severely dilated.  5. There is mild to moderate mitral annular calcification present.  6. The aorta is normal in size and structure.  EKG 07/03/2019 Atrial fibrillation with RVR, aberrantly conducted beats, Possible anterior MI  Cardiac Cath 03/2017 1. Mildly moderately elevated left heart filling pressures. 2. Mild pulmonary hypertension. 3. Normal right heart filling pressure. 4. Normal Fick cardiac output/index. 5. No angiographically significant coronary artery disease.   Neuro/Psych  Headaches,    GI/Hepatic GERD  Medicated and Controlled,  Endo/Other  Hyperthyroidism Obesity  Renal/GU   negative genitourinary   Musculoskeletal  (+) Arthritis , Osteoarthritis,    Abdominal (+) + obese,   Peds  Hematology  (+) anemia , Eliquis therapy- last dose   Anesthesia Other Findings   Reproductive/Obstetrics                              Anesthesia Physical Anesthesia Plan  ASA: III  Anesthesia Plan: General   Post-op Pain Management:    Induction: Intravenous  PONV Risk Score and Plan: 3 and Ondansetron and Treatment may vary due to age or medical condition  Airway Management Planned: Oral ETT  Additional Equipment:   Intra-op Plan:   Post-operative Plan: Extubation in OR  Informed Consent: I have reviewed the patients History and Physical, chart, labs and discussed the procedure including the risks, benefits and alternatives for the proposed anesthesia with the patient or authorized representative who has indicated his/her understanding and acceptance.     Dental advisory given  Plan Discussed with: CRNA and Surgeon  Anesthesia Plan Comments:         Anesthesia Quick Evaluation

## 2019-07-05 NOTE — Progress Notes (Signed)
   Called by staff nurse-   Covid Test negative on 07/03/19 .   Cath lab returned patient to [place] and reported mild fever and cough. Staff nurse then called me.    Check blood cultures x2, urine culture, and CXR now.   Shlonda Dolloff NP-C  12:01 PM

## 2019-07-05 NOTE — Progress Notes (Signed)
Cath lab called patient spike temp. 102 with cough. HF NP notified see epic for new order. Patient back to the room temp. 99.4 oral, constantly  clearing her throat BP soft but stable. Will continue to monitor the patient.

## 2019-07-05 NOTE — Interval H&P Note (Signed)
History and Physical Interval Note:  07/05/2019 10:12 AM  Megan Keith George Ina  has presented today for surgery, with the diagnosis of Atrial fibrillation.  The various methods of treatment have been discussed with the patient and family. After consideration of risks, benefits and other options for treatment, the patient has consented to  Procedure(s): ATRIAL FIBRILLATION ABLATION (N/A) TRANSESOPHAGEAL ECHOCARDIOGRAM (TEE) (N/A) as a surgical intervention.  The patient's history has been reviewed, patient examined, no change in status, stable for surgery.  I have reviewed the patient's chart and labs.  Questions were answered to the patient's satisfaction.    Risk, benefits, and alternatives to EP study and radiofrequency ablation for afib were also discussed in detail today via translator. These risks include but are not limited to stroke, bleeding, vascular damage, tamponade, perforation, damage to the esophagus, lungs, and other structures, pulmonary vein stenosis, worsening renal function, and death. The patient understands these risk and wishes to proceed.      Thompson Grayer

## 2019-07-05 NOTE — CV Procedure (Signed)
    TRANSESOPHAGEAL ECHOCARDIOGRAM   NAME:  Megan Keith   MRN: [id number] DOB:  07/24/1961   ADMIT DATE: 07/03/2019  INDICATIONS:   PROCEDURE:   Informed consent was obtained prior to the procedure. The risks, benefits and alternatives for the procedure were discussed and the patient comprehended these risks.  Risks include, but are not limited to, cough, sore throat, vomiting, nausea, somnolence, esophageal and stomach trauma or perforation, bleeding, low blood pressure, aspiration, pneumonia, infection, trauma to the teeth and death.    The patient was under general anesthesia by anesthesia team. After a procedural time-out, the transesophageal probe was inserted in the esophagus and stomach without difficulty and multiple views were obtained.    COMPLICATIONS:    There were no immediate complications.  FINDINGS:  LEFT VENTRICLE: EF = 5-10%. Severe global HK  RIGHT VENTRICLE: Dilated moderate HK   LEFT ATRIUM: Severely dilated LA diameter 5.8 cm  LEFT ATRIAL APPENDAGE: No thrombus.   RIGHT ATRIUM: Dilated  AORTIC VALVE:  Trileaflet. No AI/AS  MITRAL VALVE:    Normal. Moderate central MR  TRICUSPID VALVE: Normal. Moderate TR  PULMONIC VALVE: Grossly normal.  INTERATRIAL SEPTUM: No PFO or ASD.  PERICARDIUM: No effusion  DESCENDING AORTA: No plaque    Daniel Bensimhon,MD 10:51 AM

## 2019-07-05 NOTE — Plan of Care (Signed)
  Problem: Education: Goal: Ability to demonstrate management of disease process will improve Outcome: Progressing   Problem: Education: Goal: Ability to verbalize understanding of medication therapies will improve Outcome: Progressing   Problem: Activity: Goal: Capacity to carry out activities will improve Outcome: Progressing   Problem: Cardiac: Goal: Ability to achieve and maintain adequate cardiopulmonary perfusion will improve Outcome: Progressing   

## 2019-07-05 NOTE — Progress Notes (Signed)
   Called by nursing staff for pesistent cough.   CXR suggestive of vascular congestion.   Start 40 mg IV lasix twice a day. First dose now.   Aayush Gelpi NP-C  3:50 PM

## 2019-07-05 NOTE — Progress Notes (Signed)
BP has been soft, will do frequent BP on patient and notify provider as needed.

## 2019-07-05 NOTE — H&P (View-Only) (Signed)
Advanced Heart Failure Rounding Note   Subjective:    Denies SOB this am. No orthopnea or PND. Nervious about ablation.  Remains in AF rates in 90s to low 100s  Objective:   Weight Range:  Vital Signs:   Temp:  [98.1 F (36.7 C)-99.5 F (37.5 C)] 99.5 F (37.5 C) (11/12 0910) Pulse Rate:  [69-131] 94 (11/12 0910) Resp:  [16-18] 18 (11/12 0910) BP: (82-100)/(59-80) 92/80 (11/12 0910) SpO2:  [94 %-100 %] 100 % (11/12 0910) Weight:  [62 kg] 62 kg (11/12 0500) Last BM Date: 07/03/19  Weight change: Filed Weights   07/03/19 1703 07/04/19 0453 07/05/19 0500  Weight: 65.2 kg 62.9 kg 62 kg    Intake/Output:   Intake/Output Summary (Last 24 hours) at 07/05/2019 1016 Last data filed at 07/04/2019 1550 Gross per 24 hour  Intake 426.8 ml  Output 1750 ml  Net -1323.2 ml     Physical Exam: General:  Well appearing. No resp difficulty HEENT: normal Neck: supple. JVP 6-7. Carotids 2+ bilat; no bruits. No lymphadenopathy or thryomegaly appreciated. Cor: PMI nondisplaced. Irregular rate & rhythm. No rubs, gallops or murmurs. Lungs: clear Abdomen: soft, nontender, nondistended. No hepatosplenomegaly. No bruits or masses. Good bowel sounds. Extremities: no cyanosis, clubbing, rash, edema Neuro: alert & orientedx3, cranial nerves grossly intact. moves all 4 extremities w/o difficulty. Affect pleasant   Telemetry: AF 90-110 Personally reviewed   Labs: Basic Metabolic Panel: Recent Labs  Lab 07/03/19 1609 07/04/19 0342 07/05/19 0518  NA 139 140 141  K 3.7 3.0* 4.1  CL 105 104 105  CO2 23 24 23   GLUCOSE 128* 86 93  BUN 14 13 15   CREATININE 0.94 0.71 0.73  CALCIUM 9.0 8.7* 9.2  MG  --  2.3  --     Liver Function Tests: Recent Labs  Lab 07/03/19 1609  AST 24  ALT 59*  ALKPHOS 108  BILITOT 0.8  PROT 6.8  ALBUMIN 4.0   No results for input(s): LIPASE, AMYLASE in the last 168 hours. No results for input(s): AMMONIA in the last 168 hours.  CBC: Recent Labs   Lab 07/03/19 1609  WBC 13.3*  HGB 12.6  HCT 40.1  MCV 94.8  PLT 420*    Cardiac Enzymes: No results for input(s): CKTOTAL, CKMB, CKMBINDEX, TROPONINI in the last 168 hours.  BNP: BNP (last 3 results) Recent Labs    06/06/19 0620 06/07/19 0105 06/08/19 0044  BNP 1,146.8* 1,294.9* 893.6*    ProBNP (last 3 results) No results for input(s): PROBNP in the last 8760 hours.    Other results:  Imaging: No results found.   Medications:     Scheduled Medications: . [MAR Hold] apixaban  5 mg Oral BID  . [MAR Hold] digoxin  0.125 mg Oral Daily  . [MAR Hold] losartan  12.5 mg Oral QHS  . [MAR Hold] methimazole  10 mg Oral Daily  . [MAR Hold] metoprolol succinate  12.5 mg Oral BID  . [MAR Hold] predniSONE  10 mg Oral Q breakfast  . [MAR Hold] sodium chloride flush  3 mL Intravenous Q12H    Infusions: . [MAR Hold] sodium chloride    . sodium chloride    . amiodarone 30 mg/hr (07/05/19 0145)    PRN Medications: [MAR Hold] sodium chloride, [MAR Hold] acetaminophen, [MAR Hold] sodium chloride flush   Assessment/Plan:   1. Persistent Atrial Fibrillation w/ RVR: -rate in the 150s on admit now 90-110 on IV amio - s/p previous DC-CV in  7/20. Recurrent AF with COVID  - previously was on amio for PVCs but developed hyperthyroidism  - on eliquis -Continue Toprol XL 25 mg daily  -Continue digoxin 0.125 mg  - Not good AA candidate - Discussed with EP. Planning PVI today. Appreciate their care.  2.Acute onChronic systolic [id number], normal cors in 03/2017. 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 03/2017 EF 15-20% Echo 07/28/17 EF 25-30%  - Echo 11/2017: EF 25-30%.  - Echo 06/2018 EF ~30% - Echo 7/20 in setting of PAF with RVR. EF 10% - Echo 8/20 EF 25-30% - Volume overload resolved. Holding lasix today - Continue losartan at 12.5 mg  - Continue Toprol 25 bid - Unable to get cMRI due to lack of insurance.  - Suspecther CM is likely tachy-mediated (vs PVC).   3.  Hyperthyroidism - TSH low normal today. Check T3 and T4 - Continue methimazole   4.H/oFrequent PVCs - Zio patch8/20with 5% PVC burden  - SuspecttachymediatedCM. Amio previouslystopped due to hyperthyroidism. - Plan as above. Appreciate EP input  5. Left Breast Cancer. -treated w/XRT  6. Medication noncompliance -language barrier plays a role   Length of Stay: 2   Glori Bickers MD 07/05/2019, 10:16 AM  Advanced Heart Failure Team Pager [phone] (M-F; [place])  Please contact [facility] for night-coverage after hours (4p -7a ) and weekends on [email or web address]

## 2019-07-05 NOTE — Progress Notes (Signed)
  Echocardiogram 2D Echocardiogram has been performed.  Megan Keith 07/05/2019, 11:12 AM

## 2019-07-05 NOTE — Interval H&P Note (Signed)
History and Physical Interval Note:  07/05/2019 10:32 AM  Megan Keith  has presented today for surgery, with the diagnosis of Atrial fibrillation.  The various methods of treatment have been discussed with the patient and family. After consideration of risks, benefits and other options for treatment, the patient has consented to  Procedure(s): ATRIAL FIBRILLATION ABLATION (N/A) TRANSESOPHAGEAL ECHOCARDIOGRAM (TEE) (N/A) as a surgical intervention.  The patient's history has been reviewed, patient examined, no change in status, stable for surgery.  I have reviewed the patient's chart and labs.  Questions were answered to the patient's satisfaction.     [name]

## 2019-07-05 NOTE — Progress Notes (Signed)
Verbal orders to hold medications tonight- losartan and metoprolol succinate. Thanks

## 2019-07-05 NOTE — Progress Notes (Signed)
Pt, BP low. Notified on call provider about medications

## 2019-07-05 NOTE — Discharge Instructions (Signed)
Post procedure care instructions No driving for 4 days. No lifting over 5 lbs for 1 week. No vigorous or sexual activity for 1 week. You may return to work/usual activities on 07/12/2019. Keep procedure site clean & dry. If you notice increased pain, swelling, bleeding or pus, call/return!  You may shower, but no soaking baths/hot tubs/pools for 1 week.    You have an appointment set up with the [facility].  Multiple studies have shown that being followed by a dedicated atrial fibrillation clinic in addition to the standard care you receive from your other physicians improves health. We believe that enrollment in the atrial fibrillation clinic will allow [place] to better care for you.   The phone number to the [facility] is [phone]. The clinic is staffed Monday through Friday from 8:30am to 5pm.  Parking Directions: The clinic is located in the Heart and Vascular Building connected to [facility]. 1)From [address] turn on to [address] and go to the 3rd entrance  (Heart and Vascular entrance) on the right. 2)Look to the right for Heart &Vascular Parking Garage. 3) the code for the entrance is 7008, for November   4)Take the elevators to the 1st floor. Registration is in the room with the glass walls at the end of the hallway.  If you have any trouble parking or locating the clinic, please dont hesitate to call [phone].

## 2019-07-05 NOTE — Progress Notes (Signed)
   Vital Signs MEWS/VS Documentation      07/05/2019 1645 07/05/2019 1647 07/05/2019 1800 07/05/2019 1900   MEWS Score:  3  3  4  2    MEWS Score Color:  Yellow  Yellow  Red  Yellow   Resp:  -  -  (!) 22  16   Pulse:  (!) 120  (!) 120  -  -   BP:  96/77  96/77  -  -   Temp:  -  99.2 F (37.3 C)  -  -           Kollyn Lingafelter T Aminta Sakurai 07/05/2019,7:36 PM

## 2019-07-05 NOTE — Anesthesia Postprocedure Evaluation (Signed)
Anesthesia Post Note  Patient: Megan Keith  Procedure(s) Performed: TRANSESOPHAGEAL ECHOCARDIOGRAM (TEE) (N/A ) CARDIOVERSION (N/A )     Patient location during evaluation: PACU Anesthesia Type: General Level of consciousness: awake and alert and oriented Pain management: pain level controlled Vital Signs Assessment: post-procedure vital signs reviewed and stable Respiratory status: spontaneous breathing, nonlabored ventilation and respiratory function stable Cardiovascular status: blood pressure returned to baseline and stable Postop Assessment: no apparent nausea or vomiting Anesthetic complications: no    Last Vitals:  Vitals:   07/05/19 1206 07/05/19 1209  BP: 97/69 (!) 143/108  Pulse: (!) 118 (!) 118  Resp: 17   Temp: 37.4 C   SpO2: 97% 98%    Last Pain:  Vitals:   07/05/19 1206  TempSrc: Oral  PainSc:                  Gorge Almanza A.

## 2019-07-05 NOTE — Transfer of Care (Signed)
Immediate Anesthesia Transfer of Care Note  Patient: Megan Keith  Procedure(s) Performed: TRANSESOPHAGEAL ECHOCARDIOGRAM (TEE) (N/A ) CARDIOVERSION (N/A )  Patient Location: PACU and Cath Lab  Anesthesia Type:General  Level of Consciousness: awake, alert , oriented and patient cooperative  Airway & Oxygen Therapy: Patient Spontanous Breathing and Patient connected to face mask oxygen  Post-op Assessment: Report given to RN and Post -op Vital signs reviewed and stable  Post vital signs: Reviewed and stable  Last Vitals:  Vitals Value Taken Time  BP    Temp    Pulse    Resp    SpO2      Last Pain:  Vitals:   07/05/19 0910  TempSrc: Oral  PainSc: 0-No pain         Complications: No apparent anesthesia complications

## 2019-07-05 NOTE — TOC Initial Note (Addendum)
Transition of Care [facility]) - Initial/Assessment Note    Patient Details  Name: Megan Keith MRN: [id number] Date of Birth: 1960/09/18  Transition of [facility]) CM/SW Contact:    Megan Mayo, RN Phone Number: 07/05/2019, 5:08 PM  Clinical Narrative:                 Patient lives alone, will be going home with daughter to address of [address] [place] [address].  She will need HHRN for charity, NCM made referral to Megan Keith with Megan Keith.  Soc will begin 24-48 hrs post dc.  Patient will need ast with medications at discharge with [facility] and send to [place].    11/13- NCM received message that [place] was unable to be set up.  NCM spoke with MD and asked if CHF disease management could be taught here prior to dc, MD said yes.  Staff RN to do teaching with interpreter for CHF.  Patient will not need HHRN set up.   Expected Discharge Plan: [place] Barriers to Discharge: No Barriers Identified   Patient Goals and CMS Choice Patient states their goals for this hospitalization and ongoing recovery are:: go home CMS Medicare.gov Compare Post Acute Care list provided to:: Patient Choice offered to / list presented to : Patient  Expected Discharge Plan and Services Expected Discharge Plan: [place] In-house Referral: NA Discharge Planning Services: CM Consult, Medication Assistance, [place] Program Post Acute Care Choice: [place] arrangements for the past 2 months: Single Family Home                 DME Arranged: (NA)         HH Arranged: RN, Disease Management [place] Agency: [facility] [employer] Date [place]: 07/05/19 Time [place]: [place] Representative spoke with at [place]: [place] Arrangements/Services Living arrangements for the past 2 months: [place] Lives with:: Self Patient language and need for interpreter reviewed:: Yes Do you  feel safe going back to the place where you live?: Yes      Need for Family Participation in Patient Care: Yes (Comment) Care giver support system in place?: Yes (comment)   Criminal Activity/Legal Involvement Pertinent to Current Situation/Hospitalization: No - Comment as needed  Activities of Daily Living Home Assistive Devices/Equipment: None ADL Screening (condition at time of admission) Patient's cognitive ability adequate to safely complete daily activities?: Yes Is the patient deaf or have difficulty hearing?: No Does the patient have difficulty seeing, even when wearing glasses/contacts?: No Does the patient have difficulty concentrating, remembering, or making decisions?: No Patient able to express need for assistance with ADLs?: Yes Does the patient have difficulty dressing or bathing?: No Independently performs ADLs?: Yes (appropriate for developmental age) Does the patient have difficulty walking or climbing stairs?: No Weakness of Legs: None Weakness of Arms/Hands: None  Permission Sought/Granted                  Emotional Assessment Appearance:: Appears stated age Attitude/Demeanor/Rapport: Engaged Affect (typically observed): Appropriate Orientation: : Oriented to Self, Oriented to Place, Oriented to  Time, Oriented to Situation Alcohol / Substance Use: Not Applicable Psych Involvement: No (comment)  Admission diagnosis:  A Fib RVR Acute on Chronic CHF Patient Active Problem List   Diagnosis Date Noted  . COVID-19 06/06/2019  . Chronic systolic CHF (congestive heart failure) ([place])   . Atrial fibrillation with  RVR ([place]) 03/05/2019  . Hypotension 03/05/2019  . Hyperthyroidism 03/05/2019  . GERD (gastroesophageal reflux disease) 02/05/2018  . Breast cancer ([place]) 02/05/2018  . Headache 02/05/2018  . Nausea & vomiting 02/05/2018  . Epigastric abdominal pain 02/05/2018  . Sore throat 02/05/2018  . Frequent PVCs   . DCM (dilated cardiomyopathy) ([place])   .  Acute on chronic HFrEF (heart failure with reduced ejection fraction) ([place]) 04/15/2017  . 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 [place] [address] Phone: [phone] Fax: [place], [place] - [address] [place] [address] Phone: [phone] Fax: [phone]     Social Determinants of Health (SDOH) Interventions    Readmission Risk Interventions No flowsheet data found.

## 2019-07-05 NOTE — Anesthesia Procedure Notes (Signed)
Procedure Name: Intubation Date/Time: 07/05/2019 10:34 AM Performed by: Renato Shin, CRNA Pre-anesthesia Checklist: Patient identified, Emergency Drugs available, Suction available and Patient being monitored Patient Re-evaluated:Patient Re-evaluated prior to induction Oxygen Delivery Method: Circle system utilized Preoxygenation: Pre-oxygenation with 100% oxygen Induction Type: IV induction and Rapid sequence Laryngoscope Size: Miller and 2 Grade View: Grade I Tube type: Oral Tube size: 7.5 mm Number of attempts: 1 Airway Equipment and Method: Stylet and Oral airway Placement Confirmation: ETT inserted through vocal cords under direct vision,  positive ETCO2 and breath sounds checked- equal and bilateral Secured at: 20 cm Tube secured with: Tape Dental Injury: Teeth and Oropharynx as per pre-operative assessment

## 2019-07-05 NOTE — Progress Notes (Signed)
Patient on Amiodarone, metoprolol and digoxin

## 2019-07-06 ENCOUNTER — Inpatient Hospital Stay: Payer: Self-pay

## 2019-07-06 LAB — MAGNESIUM: Magnesium: 2.3 mg/dL (ref 1.7–2.4)

## 2019-07-06 LAB — BASIC METABOLIC PANEL
Anion gap: 10 (ref 5–15)
BUN: 11 mg/dL (ref 6–20)
CO2: 25 mmol/L (ref 22–32)
Calcium: 9 mg/dL (ref 8.9–10.3)
Chloride: 103 mmol/L (ref 98–111)
Creatinine, Ser: 0.71 mg/dL (ref 0.44–1.00)
GFR calc Af Amer: >60 mL/min (ref >60)
GFR calc non Af Amer: >60 mL/min (ref >60)
Glucose, Bld: 87 mg/dL (ref 70–99)
Potassium: 3.3 mmol/L — ABNORMAL LOW (ref 3.5–5.1)
Sodium: 138 mmol/L (ref 135–145)

## 2019-07-06 MED ORDER — SPIRONOLACTONE 12.5 MG HALF TABLET
12.5000 mg | ORAL_TABLET | Freq: Every day | ORAL | Status: DC
  Administered 2019-07-06 – 2019-07-10 (×5): 12.5 mg via ORAL
  Filled 2019-07-06 (×5): qty 1

## 2019-07-06 MED ORDER — POTASSIUM CHLORIDE CRYS ER 20 MEQ PO TBCR
40.0000 meq | EXTENDED_RELEASE_TABLET | ORAL | Status: AC
  Administered 2019-07-06 (×2): 40 meq via ORAL
  Filled 2019-07-06 (×2): qty 2

## 2019-07-06 NOTE — Progress Notes (Signed)
Telemetry reviewed with Dr. Rayann Heman, remains in [place] this AM with rare PVCs.  EP service will step back, remain available, please recall if needed.  Tommye Standard, PA-C

## 2019-07-06 NOTE — Progress Notes (Signed)
Per Heart Failure team, hold am meds until provider sees pt and obtain manual B/P.: Rt arm 86/66 manual, 2 nd reading 90/64.  Pt says "she feels fine". Pt asymptommatic.

## 2019-07-06 NOTE — Plan of Care (Signed)

## 2019-07-06 NOTE — Progress Notes (Signed)
In preparation for HF teaching, with assistance from interpretor asked pt if she has a scale to home to weigh herself.  Patient has a scale but it is broken.  Notified case manager Tomi Bamberger, RN.

## 2019-07-06 NOTE — Progress Notes (Signed)
Responded to spiritual care consult for an AD. Pt was sitting up beside her bed. Spanish speaker [place] Pink stated she does not know how to read. She did however request a copy of the AD in spanish. I gave her on overview of filling it out and she said that she wanted to take it home to have her daughter help her to fill it out. She was very thankful for the [place] visit.   Chaplain Resident  Fidel Levy [phone]

## 2019-07-06 NOTE — Progress Notes (Signed)
MEWS 2, r/t B/P 87/75 (80) HR 107, not an acute change.  Assessed pt in room, pt denies feeling dizzy or lightheaded, and is asymptomatic.  Notified provider Dr. Haroldine Laws, MD.

## 2019-07-06 NOTE — Progress Notes (Signed)
The patient remains in sinus rhythm after cardioversion.  PVCs appear currently controlled. Hopefully, she will have improvement in EF with sinus rhythm.  If not, advanced therapies may need to be considered.  If she returns to AF with RVR over the short term, I would advise AV nodal ablation with CRT.   If she remains in sinus rhythm with amiodarone and her EF recovers, we could consider elective afib ablation eventually (a few months from now).  Unfortunately, noncompliance and lack of insurance complicate her options.  EP to be available as needed over the weekend.  Thompson Grayer MD, [facility] 07/06/2019 10:11 PM

## 2019-07-06 NOTE — Progress Notes (Signed)
IV team STAT order placed for DL PICC line.  Received call from PICC line nurse stating that PICC will not be able to be placed tonight due to lack of coverage after 7 pm.  PICC nurse will be available at 7 am tomorrow. Made PICC nurse aware that this was a STAT verbal order from Dr. Haroldine Laws, MD.

## 2019-07-06 NOTE — Progress Notes (Addendum)
Advanced Heart Failure Rounding Note   Subjective:    Admitted for afib w/ RVR + A/C systolic HF. Initial plans were for PVI ablation but procedure canceled after TEE showed severely reduced EF at 5% and severe LAE. Pt underwent DCCV instead.    In sinus tach 110s currently. BP low [id number] systolic. K 3.3.   No complaints this AM.   -1.1 L out w/ IV Lasix yesterday. Net I/Os -2.9 L. Wt down and additional 2 lb from 136>>134 lb. Overall down 9 lb since admit.    Objective:   Weight Range:  Vital Signs:   Temp:  [97.5 F (36.4 C)-99.4 F (37.4 C)] 98.2 F (36.8 C) (11/13 0808) Pulse Rate:  [94-122] 107 (11/13 0808) Resp:  [15-24] 15 (11/13 0911) BP: (72-143)/(49-108) 90/64 (11/13 0911) SpO2:  [93 %-100 %] 100 % (11/13 0905) Weight:  [61 kg] 61 kg (11/13 0421) Last BM Date: 07/05/19  Weight change: Filed Weights   07/04/19 0453 07/05/19 0500 07/06/19 0421  Weight: 62.9 kg 62 kg 61 kg    Intake/Output:   Intake/Output Summary (Last 24 hours) at 07/06/2019 0949 Last data filed at 07/06/2019 0914 Gross per 24 hour  Intake 910 ml  Output 1150 ml  Net -240 ml     Physical Exam: PHYSICAL EXAM: General:  Well appearing Hispanic female. No respiratory difficulty HEENT: normal Neck: supple. no JVD. Carotids 2+ bilat; no bruits. No lymphadenopathy or thyromegaly appreciated. Cor: PMI nondisplaced. Regula rhythm. Tachy rate. No rubs, gallops or murmurs. Lungs: clear Abdomen: soft, nontender, nondistended. No hepatosplenomegaly. No bruits or masses. Good bowel sounds. Extremities: no cyanosis, clubbing, rash, edema Neuro: alert & oriented x 3, cranial nerves grossly intact. moves all 4 extremities w/o difficulty. Affect pleasant.   Telemetry: sinus tach low 100s-110s Personally reviewed   Labs: Basic Metabolic Panel: Recent Labs  Lab 07/03/19 1609 07/04/19 0342 07/05/19 0518 07/06/19 0427  NA 139 140 141 138  K 3.7 3.0* 4.1 3.3*  CL 105 104 105 103  CO2 23  24 23 25   GLUCOSE 128* 86 93 87  BUN 14 13 15 11   CREATININE 0.94 0.71 0.73 0.71  CALCIUM 9.0 8.7* 9.2 9.0  MG  --  2.3  --   --     Liver Function Tests: Recent Labs  Lab 07/03/19 1609  AST 24  ALT 59*  ALKPHOS 108  BILITOT 0.8  PROT 6.8  ALBUMIN 4.0   No results for input(s): LIPASE, AMYLASE in the last 168 hours. No results for input(s): AMMONIA in the last 168 hours.  CBC: Recent Labs  Lab 07/03/19 1609  WBC 13.3*  HGB 12.6  HCT 40.1  MCV 94.8  PLT 420*    Cardiac Enzymes: No results for input(s): CKTOTAL, CKMB, CKMBINDEX, TROPONINI in the last 168 hours.  BNP: BNP (last 3 results) Recent Labs    06/06/19 0620 06/07/19 0105 06/08/19 0044  BNP 1,146.8* 1,294.9* 893.6*    ProBNP (last 3 results) No results for input(s): PROBNP in the last 8760 hours.    Other results:  Imaging: Dg Chest Port 1 View  Result Date: 07/05/2019 CLINICAL DATA:  Shortness of breath. EXAM: PORTABLE CHEST 1 VIEW COMPARISON:  Chest x-ray dated June 22, 2019. FINDINGS: Stable cardiomegaly. Mild pulmonary vascular congestion. No focal consolidation, pleural effusion, or pneumothorax. No acute osseous abnormality. IMPRESSION: Stable cardiomegaly with new pulmonary vascular congestion. Electronically Signed   By: Titus Dubin M.D.   On: 07/05/2019 12:32  Medications:     Scheduled Medications: . apixaban  5 mg Oral BID  . digoxin  0.125 mg Oral Daily  . furosemide  40 mg Intravenous BID  . losartan  12.5 mg Oral QHS  . methimazole  10 mg Oral Daily  . metoprolol succinate  12.5 mg Oral BID  . pneumococcal 23 valent vaccine  0.5 mL Intramuscular Tomorrow-1000  . predniSONE  10 mg Oral Q breakfast  . sodium chloride flush  3 mL Intravenous Q12H    Infusions: . sodium chloride    . sodium chloride    . amiodarone 30 mg/hr (07/06/19 0444)    PRN Medications: sodium chloride, acetaminophen, guaiFENesin, sodium chloride flush   Assessment/Plan:   1.  Persistent Atrial Fibrillation w/ RVR: -rate in the 150s on admit, started on IV amiodarone - s/p previous DC-CV in 7/20. Recurrent AF with COVID  - previously was on amio for PVCs but developed hyperthyroidism  - on Eliquis 5 mg bid - Initial plans were for PVI ablation this admit but procedure canceled after TEE showed severely reduced EF at 5% and severe LAE. Pt underwent DCCV instead 11/12.   - Currently sinus tach on tele, low 100s.  - Continue IV amiodarone load -Continue Toprol XL 25 mg daily  -Continue digoxin 0.125 mg  - Continue Eliquis  - Appreciate EP's assistance  2.Acute onChronic systolic [id number], normal cors in 03/2017. 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 03/2017 EF 15-20% Echo 07/28/17 EF 25-30%  - Echo 11/2017: EF 25-30%.  - Echo 06/2018 EF ~30% - Echo 7/20 in setting of PAF with RVR. EF 10% - Echo 8/20 EF 25-30% - Volume improved w/ lasix, wt down 9 lb since admit from 143>>134 lb.  - SCr stable.  - Give another dose of IV Lasix this am and plan to transition to PO tomorrow.  - Losartan has been on hold due to low BP - Continue Toprol 25 bid - Continue digoxin 0.125 mg  - supp K  - Unable to get cMRI due to lack of insurance.  - Suspecther CM is likely tachy-mediated (vs PVC).   3. Hyperthyroidism - TSH low normal, 0.354. Free T3 WNL 3.7. Free elevated T4 1.35 - Continue methimazole   4.H/oFrequent PVCs - Zio patch8/20with 5% PVC burden  - SuspecttachymediatedCM. Amio previouslystopped due to hyperthyroidism. - Plan as above. Appreciate EP input  5. Left Breast Cancer. -treated w/XRT  6. Medication noncompliance -language barrier plays a role  7. Hypokalemia: K 3.3.  - give supp Kdur 40 mEq  - f/u BMP in the AM  - check Mg level   8. Prolonged QT: prolonged QT on tele - will check STAT 12 lead EKG  - check Mg level - supp K now  - avoid QT prolonging agents    Length of Stay: 3   Lyda Jester MD 07/06/2019, 9:49 AM   Advanced Heart Failure Team Pager [phone] (M-F; [place])  Please contact [facility] for night-coverage after hours (4p -7a ) and weekends on [email or web address]  Patient seen and examined with the above-signed Advanced Practice Provider and/or Housestaff. I personally reviewed laboratory data, imaging studies and relevant notes. I independently examined the patient and formulated the important aspects of the plan. I have edited the note to reflect any of my changes or salient points. I have personally discussed the plan with the patient and/or family.  Was planned for PVI yesterday but pre-PVI TEE showed LVEF 5-10% severe LAE and moderate RV dysfunction so PVI  cancelled and underwent DC-CV  Now in NSR on IV amio. Feels ok. Throat sore. Denies orthopnea or PND.   Tele with ST 100-110  General:  Sitting up on side of bed No resp difficulty HEENT: normal Neck: supple. JVP 6-7. Carotids 2+ bilat; no bruits. No lymphadenopathy or thryomegaly appreciated. Cor: PMI nondisplaced. Regular tachy + s3 Lungs: clear Abdomen: soft, nontender, nondistended. No hepatosplenomegaly. No bruits or masses. Good bowel sounds. Extremities: no cyanosis, clubbing, rash, edema Neuro: alert & orientedx3, cranial nerves grossly intact. moves all 4 extremities w/o difficulty. Affect pleasant  She is quite tenuous. Will place PICC to follow co-ox and CVP. Hopefully LV function with rebound with maintenance of NSR (EF was up to 35% recently). Continue IV amio despite previous amio-induced hyperthyroidism as it is our only option. Not ideal candidate for advanced therapies with h/o non-compliance (due in part to language barrier).   Discussed with her in detail via interpreter.   Glori Bickers, MD  11:59 AM

## 2019-07-06 NOTE — Progress Notes (Signed)
Spoke with Lattie Haw, RN and made aware that PICC is unable to be placed until 11-14.  She states that she will notify Dr. Missy Sabins.

## 2019-07-07 ENCOUNTER — Encounter (HOSPITAL_COMMUNITY): Payer: Self-pay

## 2019-07-07 DIAGNOSIS — I5043 Acute on chronic combined systolic (congestive) and diastolic (congestive) heart failure: Secondary | ICD-10-CM

## 2019-07-07 LAB — URINE CULTURE: Culture: 20000 — AB

## 2019-07-07 LAB — BASIC METABOLIC PANEL
Anion gap: 13 (ref 5–15)
BUN: 16 mg/dL (ref 6–20)
CO2: 25 mmol/L (ref 22–32)
Calcium: 9.3 mg/dL (ref 8.9–10.3)
Chloride: 99 mmol/L (ref 98–111)
Creatinine, Ser: 0.71 mg/dL (ref 0.44–1.00)
GFR calc Af Amer: >60 mL/min (ref >60)
GFR calc non Af Amer: >60 mL/min (ref >60)
Glucose, Bld: 94 mg/dL (ref 70–99)
Potassium: 4.7 mmol/L (ref 3.5–5.1)
Sodium: 137 mmol/L (ref 135–145)

## 2019-07-07 MED ORDER — AMIODARONE HCL 200 MG PO TABS
400.0000 mg | ORAL_TABLET | Freq: Two times a day (BID) | ORAL | Status: DC
  Administered 2019-07-07 – 2019-07-10 (×7): 400 mg via ORAL
  Filled 2019-07-07 (×7): qty 2

## 2019-07-07 MED ORDER — ANASTROZOLE 1 MG PO TABS
1.0000 mg | ORAL_TABLET | Freq: Every day | ORAL | Status: DC
  Administered 2019-07-07 – 2019-07-10 (×4): 1 mg via ORAL
  Filled 2019-07-07 (×4): qty 1

## 2019-07-07 MED ORDER — FUROSEMIDE 40 MG PO TABS
40.0000 mg | ORAL_TABLET | Freq: Every day | ORAL | Status: DC
  Administered 2019-07-07 – 2019-07-10 (×3): 40 mg via ORAL
  Filled 2019-07-07 (×4): qty 1

## 2019-07-07 MED ORDER — CHLORHEXIDINE GLUCONATE CLOTH 2 % EX PADS
6.0000 | MEDICATED_PAD | Freq: Every day | CUTANEOUS | Status: DC
  Administered 2019-07-08 – 2019-07-10 (×3): 6 via TOPICAL

## 2019-07-07 MED ORDER — PROSIGHT PO TABS
1.0000 | ORAL_TABLET | Freq: Every day | ORAL | Status: DC
  Administered 2019-07-07 – 2019-07-10 (×4): 1 via ORAL
  Filled 2019-07-07 (×4): qty 1

## 2019-07-07 MED ORDER — SODIUM CHLORIDE 0.9% FLUSH
10.0000 mL | Freq: Two times a day (BID) | INTRAVENOUS | Status: DC
  Administered 2019-07-07 – 2019-07-10 (×5): 10 mL

## 2019-07-07 NOTE — Progress Notes (Addendum)
Attempted to call IV team about PICC line.  Still no PICC line, left page to return call.

## 2019-07-07 NOTE — Progress Notes (Signed)
During am med pass patient informed me that she had home medications in her room.  One of which she needs to take daily for breast cancer, anastrazole 1 mg.  Notified Kathlen Mody, [place] (cardiology) and called pharmacy.  Home medication form completed with home medications sent to pharmacy.

## 2019-07-07 NOTE — Progress Notes (Signed)
Patient ID: Megan Keith, female   DOB: March 26, 1961, 58 y.o.   MRN: [id number]    Advanced Heart Failure Rounding Note   Subjective:    Admitted for afib w/ RVR + A/C systolic HF. Initial plans were for PVI ablation but procedure canceled after TEE showed severely reduced EF at 5% and severe LAE. Pt underwent DCCV instead.  She remains in NSR today on amiodarone gtt.   No complaints, feels good this morning.  Did not get PICC yesterday.   Objective:   Weight Range:  Vital Signs:   Temp:  [97.4 F (36.3 C)-98 F (36.7 C)] 98 F (36.7 C) (11/14 0737) Pulse Rate:  [61-94] 93 (11/14 0737) Resp:  [12-20] 18 (11/14 0737) BP: (82-131)/(57-81) 92/72 (11/14 0737) SpO2:  [95 %-100 %] 100 % (11/14 0737) Weight:  [61.2 kg] 61.2 kg (11/14 0352) Last BM Date: 07/06/19  Weight change: Filed Weights   07/05/19 0500 07/06/19 0421 07/07/19 0352  Weight: 62 kg 61 kg 61.2 kg    Intake/Output:   Intake/Output Summary (Last 24 hours) at 07/07/2019 1133 Last data filed at 07/07/2019 0357 Gross per 24 hour  Intake 1418.16 ml  Output 1050 ml  Net 368.16 ml     Physical Exam: General: NAD Neck: No JVD, no thyromegaly or thyroid nodule.  Lungs: Clear to auscultation bilaterally with normal respiratory effort. CV: Lateral PMI.  Heart regular S1/S2, no S3/S4, no murmur.  No peripheral edema.   Abdomen: Soft, nontender, no hepatosplenomegaly, no distention.  Skin: Intact without lesions or rashes.  Neurologic: Alert and oriented x 3.  Psych: Normal affect. Extremities: No clubbing or cyanosis.  HEENT: Normal.   Telemetry: NSR 100s Personally reviewed   Labs: Basic Metabolic Panel: Recent Labs  Lab 07/03/19 1609 07/04/19 0342 07/05/19 0518 07/06/19 0427 07/06/19 1054 07/07/19 0518  NA 139 140 141 138  --  137  K 3.7 3.0* 4.1 3.3*  --  4.7  CL 105 104 105 103  --  99  CO2 23 24 23 25   --  25  GLUCOSE 128* 86 93 87  --  94  BUN 14 13 15 11   --  16  CREATININE 0.94  0.71 0.73 0.71  --  0.71  CALCIUM 9.0 8.7* 9.2 9.0  --  9.3  MG  --  2.3  --   --  2.3  --     Liver Function Tests: Recent Labs  Lab 07/03/19 1609  AST 24  ALT 59*  ALKPHOS 108  BILITOT 0.8  PROT 6.8  ALBUMIN 4.0   No results for input(s): LIPASE, AMYLASE in the last 168 hours. No results for input(s): AMMONIA in the last 168 hours.  CBC: Recent Labs  Lab 07/03/19 1609  WBC 13.3*  HGB 12.6  HCT 40.1  MCV 94.8  PLT 420*    Cardiac Enzymes: No results for input(s): CKTOTAL, CKMB, CKMBINDEX, TROPONINI in the last 168 hours.  BNP: BNP (last 3 results) Recent Labs    06/06/19 0620 06/07/19 0105 06/08/19 0044  BNP 1,146.8* 1,294.9* 893.6*    ProBNP (last 3 results) No results for input(s): PROBNP in the last 8760 hours.    Other results:  Imaging: Dg Chest Port 1 View  Result Date: 07/05/2019 CLINICAL DATA:  Shortness of breath. EXAM: PORTABLE CHEST 1 VIEW COMPARISON:  Chest x-ray dated June 22, 2019. FINDINGS: Stable cardiomegaly. Mild pulmonary vascular congestion. No focal consolidation, pleural effusion, or pneumothorax. No acute osseous abnormality. IMPRESSION: Stable cardiomegaly  with new pulmonary vascular congestion. Electronically Signed   By: Titus Dubin M.D.   On: 07/05/2019 12:32   [place] Ekg Site Rite  Result Date: 07/06/2019 If Site Rite image not attached, placement could not be confirmed due to current cardiac rhythm.    Medications:     Scheduled Medications: . amiodarone  400 mg Oral BID  . anastrozole  1 mg Oral Daily  . apixaban  5 mg Oral BID  . digoxin  0.125 mg Oral Daily  . furosemide  40 mg Oral Daily  . losartan  12.5 mg Oral QHS  . methimazole  10 mg Oral Daily  . multivitamin  1 tablet Oral Daily  . predniSONE  10 mg Oral Q breakfast  . sodium chloride flush  3 mL Intravenous Q12H  . spironolactone  12.5 mg Oral Daily    Infusions: . sodium chloride    . sodium chloride      PRN Medications: sodium  chloride, acetaminophen, guaiFENesin, sodium chloride flush   Assessment/Plan:   1. Persistent Atrial Fibrillation w/ RVR: - rate in the 150s on admit, started on IV amiodarone - s/p previous DC-CV in 7/20. Recurrent AF with COVID  - previously was on amio for PVCs but developed hyperthyroidism  - on Eliquis 5 mg bid - Initial plans were for PVI ablation this admit but procedure canceled after TEE showed severely reduced EF at 5% and severe LAE. Pt underwent DCCV instead 11/12.   - She remains in NSR on amiodarone gtt. Can transition to 400 mg po bid amiodarone today.  - Continue digoxin 0.125 mg  - Continue Eliquis  - Appreciate EP's assistance  2.Acute onChronic systolic [id number], normal cors in 03/2017. 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 03/2017 EF 15-20% Echo 07/28/17 EF 25-30%  - Echo 11/2017: EF 25-30%.  - Echo 06/2018 EF ~30% - Echo 7/20 in setting of PAF with RVR. EF 10% - Echo 8/20 EF 25-30% - Volume improved w/ lasix IV.  Will start Lasix 40 mg po daily today.  Noted plan to place PICC to assess CVP and co-ox (ordered yesterday but cannot be done until today).   - losartan 12.5 mg daily, no BP room to increase.  - spironolactone 12.5 daily.  - Continue digoxin 0.125 mg  - Can get cMRI as inpatient Monday.  - Suspecther CM is likely tachy-mediated (vs PVC).  Hopefully she will have some functional improvement in NSR.   - Not great candidate for advanced therapies, pharmacist did thorough home med review today and she appears to be quite noncompliant.   3. Hyperthyroidism - TSH low normal, 0.354. Free T3 WNL 3.7. Free elevated T4 1.35 - Continue methimazole  -  Continue amio despite amio-induced hyperthyroidism as it is our only option.  4.H/oFrequent PVCs - Zio patch8/20with 5% PVC burden  - SuspecttachymediatedCM. Amio previouslystopped due to hyperthyroidism. - Plan as above. Appreciate EP input  5. Left Breast Cancer. -treated w/XRT  6. Medication  noncompliance -language barrier plays a role  7. Hypokalemia: Resolve.  Length of Stay: 4   Loralie Champagne MD 07/07/2019, 11:33 AM  Advanced Heart Failure Team Pager [phone] (M-F; 7a - 4p)  Please contact [facility] for night-coverage after hours (4p -7a ) and weekends on [email or web address]

## 2019-07-07 NOTE — Progress Notes (Signed)
No acute change over past 48-72 hrs. Provider is aware of HR and B/P.

## 2019-07-07 NOTE — Progress Notes (Signed)
Peripherally Inserted Central Catheter/Midline Placement  The IV Nurse has discussed with the patient and/or persons authorized to consent for the patient, the purpose of this procedure and the potential benefits and risks involved with this procedure.  The benefits include less needle sticks, lab draws from the catheter, and the patient may be discharged home with the catheter. Risks include, but not limited to, infection, bleeding, blood clot (thrombus formation), and puncture of an artery; nerve damage and irregular heartbeat and possibility to perform a PICC exchange if needed/ordered by physician.  Alternatives to this procedure were also discussed.  [employer] Power PICC patient education guide, fact sheet on infection prevention and patient information card has been provided to patient /or left at bedside.    PICC/Midline Placement Documentation  PICC Double Lumen 07/07/19 PICC Right Brachial 33 cm 0 cm (Active)  Indication for Insertion or Continuance of Line Limited venous access - need for IV therapy >5 days (PICC only) 07/07/19 1800  Exposed Catheter (cm) 0 cm 07/07/19 1800  Site Assessment Clean;Dry;Intact 07/07/19 1800  Lumen #1 Status Flushed;Blood return noted;Saline locked 07/07/19 1800  Lumen #2 Status Flushed;Blood return noted;Saline locked 07/07/19 1800  Dressing Type Transparent 07/07/19 1800  Dressing Status Clean;Dry;Intact;Antimicrobial disc in place 07/07/19 1800  [place] checked and tightened 07/07/19 1800  Line Adjustment (NICU/IV Team Only) No 07/07/19 1800  Dressing Intervention New dressing 07/07/19 1800  Dressing Change Due 07/14/19 07/07/19 1800       Lorenza Cambridge 07/07/2019, 6:35 PM

## 2019-07-07 NOTE — Progress Notes (Signed)
Contacted IV team, PICC team at [facility] this am.  PICC team to be at [facility] for PICC plcvement this afternoon.

## 2019-07-08 LAB — COOXEMETRY PANEL
Carboxyhemoglobin: 1.6 % — ABNORMAL HIGH (ref 0.5–1.5)
Methemoglobin: 0.9 % (ref 0.0–1.5)
O2 Saturation: 65.3 %
Total hemoglobin: 14.4 g/dL (ref 12.0–16.0)

## 2019-07-08 LAB — BASIC METABOLIC PANEL
Anion gap: 11 (ref 5–15)
BUN: 15 mg/dL (ref 6–20)
CO2: 25 mmol/L (ref 22–32)
Calcium: 9.3 mg/dL (ref 8.9–10.3)
Chloride: 100 mmol/L (ref 98–111)
Creatinine, Ser: 0.76 mg/dL (ref 0.44–1.00)
GFR calc Af Amer: >60 mL/min (ref >60)
GFR calc non Af Amer: >60 mL/min (ref >60)
Glucose, Bld: 90 mg/dL (ref 70–99)
Potassium: 3.8 mmol/L (ref 3.5–5.1)
Sodium: 136 mmol/L (ref 135–145)

## 2019-07-08 MED ORDER — ONDANSETRON HCL 4 MG/2ML IJ SOLN
4.0000 mg | Freq: Three times a day (TID) | INTRAMUSCULAR | Status: DC | PRN
  Administered 2019-07-08: 4 mg via INTRAVENOUS
  Filled 2019-07-08: qty 2

## 2019-07-08 MED ORDER — POTASSIUM CHLORIDE CRYS ER 20 MEQ PO TBCR
40.0000 meq | EXTENDED_RELEASE_TABLET | Freq: Once | ORAL | Status: AC
  Administered 2019-07-08: 40 meq via ORAL
  Filled 2019-07-08: qty 2

## 2019-07-08 NOTE — Progress Notes (Signed)
Patient ID: Megan Keith, female   DOB: May 17, 1961, 58 y.o.   MRN: [id number]    Advanced Heart Failure Rounding Note   Subjective:    Admitted for afib w/ RVR + A/C systolic HF. Initial plans were for PVI ablation but procedure canceled after TEE showed severely reduced EF at 5% and severe LAE. Pt underwent DCCV instead.  She remains in NSR today on amiodarone gtt.    No complaints, feels good this morning. CVP < 5 with co-ox 65%.  SBP 80s-90s, no lightheadedness.   Objective:   Weight Range:  Vital Signs:   Temp:  [98.2 F (36.8 C)-98.6 F (37 C)] 98.3 F (36.8 C) (11/15 0751) Pulse Rate:  [87-101] 89 (11/15 0751) Resp:  [16-18] 18 (11/15 0348) BP: (83-95)/(62-78) 86/65 (11/15 0751) SpO2:  [93 %-100 %] 99 % (11/15 0751) Weight:  [61.5 kg] 61.5 kg (11/15 0348) Last BM Date: 07/06/19  Weight change: Filed Weights   07/06/19 0421 07/07/19 0352 07/08/19 0348  Weight: 61 kg 61.2 kg 61.5 kg    Intake/Output:   Intake/Output Summary (Last 24 hours) at 07/08/2019 1056 Last data filed at 07/08/2019 0900 Gross per 24 hour  Intake 1014.65 ml  Output 100 ml  Net 914.65 ml     Physical Exam: General: NAD Neck: No JVD, no thyromegaly or thyroid nodule.  Lungs: Clear to auscultation bilaterally with normal respiratory effort. CV: Nondisplaced PMI.  Heart regular S1/S2, no S3/S4, no murmur.  No peripheral edema.   Abdomen: Soft, nontender, no hepatosplenomegaly, no distention.  Skin: Intact without lesions or rashes.  Neurologic: Alert and oriented x 3.  Psych: Normal affect. Extremities: No clubbing or cyanosis.  HEENT: Normal.   Telemetry: NSR 90s-100s Personally reviewed   Labs: Basic Metabolic Panel: Recent Labs  Lab 07/04/19 0342 07/05/19 0518 07/06/19 0427 07/06/19 1054 07/07/19 0518 07/08/19 0410  NA 140 141 138  --  137 136  K 3.0* 4.1 3.3*  --  4.7 3.8  CL 104 105 103  --  99 100  CO2 24 23 25   --  25 25  GLUCOSE 86 93 87  --  94 90  BUN  13 15 11   --  16 15  CREATININE 0.71 0.73 0.71  --  0.71 0.76  CALCIUM 8.7* 9.2 9.0  --  9.3 9.3  MG 2.3  --   --  2.3  --   --     Liver Function Tests: Recent Labs  Lab 07/03/19 1609  AST 24  ALT 59*  ALKPHOS 108  BILITOT 0.8  PROT 6.8  ALBUMIN 4.0   No results for input(s): LIPASE, AMYLASE in the last 168 hours. No results for input(s): AMMONIA in the last 168 hours.  CBC: Recent Labs  Lab 07/03/19 1609  WBC 13.3*  HGB 12.6  HCT 40.1  MCV 94.8  PLT 420*    Cardiac Enzymes: No results for input(s): CKTOTAL, CKMB, CKMBINDEX, TROPONINI in the last 168 hours.  BNP: BNP (last 3 results) Recent Labs    06/06/19 0620 06/07/19 0105 06/08/19 0044  BNP 1,146.8* 1,294.9* 893.6*    ProBNP (last 3 results) No results for input(s): PROBNP in the last 8760 hours.    Other results:  Imaging: [place] Ekg Site Rite  Result Date: 07/06/2019 If Site Rite image not attached, placement could not be confirmed due to current cardiac rhythm.    Medications:     Scheduled Medications: . amiodarone  400 mg Oral BID  .  anastrozole  1 mg Oral Daily  . apixaban  5 mg Oral BID  . Chlorhexidine Gluconate Cloth  6 each Topical Daily  . digoxin  0.125 mg Oral Daily  . furosemide  40 mg Oral Daily  . losartan  12.5 mg Oral QHS  . methimazole  10 mg Oral Daily  . multivitamin  1 tablet Oral Daily  . predniSONE  10 mg Oral Q breakfast  . sodium chloride flush  10-40 mL Intracatheter Q12H  . sodium chloride flush  3 mL Intravenous Q12H  . spironolactone  12.5 mg Oral Daily    Infusions: . sodium chloride    . sodium chloride      PRN Medications: sodium chloride, acetaminophen, guaiFENesin, sodium chloride flush   Assessment/Plan:   1. Persistent Atrial Fibrillation w/ RVR: - rate in the 150s on admit, started on IV amiodarone - s/p previous DC-CV in 7/20. Recurrent AF with COVID  - previously was on amio for PVCs but developed hyperthyroidism  - on Eliquis 5 mg  bid - Initial plans were for PVI ablation this admit but procedure canceled after TEE showed severely reduced EF at 5% and severe LAE. Pt underwent DCCV instead 11/12.   - She is in NSR today on po amiodarone.   - Continue digoxin 0.125 mg  - Continue Eliquis  - Appreciate EP's assistance  2.Acute onChronic systolic [id number], normal cors in 03/2017. 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 03/2017 EF 15-20% Echo 07/28/17 EF 25-30%  - Echo 11/2017: EF 25-30%.  - Echo 06/2018 EF ~30% - Echo 7/20 in setting of PAF with RVR. EF 10% - Echo 8/20 EF 25-30% - She is now on po Lasix, continue.  CVP < 5.   - losartan 12.5 mg daily, no BP room to increase.  - spironolactone 12.5 daily, no BP room to increase.  - Continue digoxin 0.125 mg  - Can get cMRI as inpatient Monday.  - Suspecther CM is likely tachy-mediated (vs PVC).  Hopefully she will have some functional improvement in NSR.   - Not great candidate for advanced therapies, pharmacist did thorough home med review Saturday and she appears to be quite noncompliant.   3. Hyperthyroidism - TSH low normal, 0.354. Free T3 WNL 3.7. Free elevated T4 1.35 - Continue methimazole  -  Continue amio despite amio-induced hyperthyroidism as it is our only option.  4.H/oFrequent PVCs - Zio patch8/20with 5% PVC burden  - SuspecttachymediatedCM. Amio previouslystopped due to hyperthyroidism. - Plan as above. Appreciate EP input  5. Left Breast Cancer. -treated w/XRT  6. Medication noncompliance -language barrier plays a role  7. Hypokalemia: Resolved.  She will walk in the halls to day, if she remains stable and in NSR, should be able to go home tomorrow.   Length of Stay: 5   Loralie Champagne MD 07/08/2019, 10:55 AM  Advanced Heart Failure Team Pager [phone] (M-F; 7a - 4p)  Please contact [facility] for night-coverage after hours (4p -7a ) and weekends on [email or web address]

## 2019-07-08 NOTE — Plan of Care (Signed)

## 2019-07-08 NOTE — Progress Notes (Signed)
Received report from [place], Therapist, sports. Pt resting in bed with eyes closed. No distress noted at this time.

## 2019-07-08 NOTE — Plan of Care (Signed)
  Problem: Education: Goal: Ability to demonstrate management of disease process will improve Outcome: Progressing   Problem: Education: Goal: Ability to verbalize understanding of medication therapies will improve Outcome: Progressing   

## 2019-07-09 LAB — BASIC METABOLIC PANEL
Anion gap: 10 (ref 5–15)
BUN: 13 mg/dL (ref 6–20)
CO2: 25 mmol/L (ref 22–32)
Calcium: 9.2 mg/dL (ref 8.9–10.3)
Chloride: 101 mmol/L (ref 98–111)
Creatinine, Ser: 0.72 mg/dL (ref 0.44–1.00)
GFR calc Af Amer: >60 mL/min (ref >60)
GFR calc non Af Amer: >60 mL/min (ref >60)
Glucose, Bld: 92 mg/dL (ref 70–99)
Potassium: 4.2 mmol/L (ref 3.5–5.1)
Sodium: 136 mmol/L (ref 135–145)

## 2019-07-09 LAB — DIGOXIN LEVEL: Digoxin Level: 0.5 ng/mL — ABNORMAL LOW (ref 0.8–2.0)

## 2019-07-09 LAB — COOXEMETRY PANEL
Carboxyhemoglobin: 1.7 % — ABNORMAL HIGH (ref 0.5–1.5)
Methemoglobin: 0.9 % (ref 0.0–1.5)
O2 Saturation: 60.5 %
Total hemoglobin: 14.1 g/dL (ref 12.0–16.0)

## 2019-07-09 NOTE — Progress Notes (Addendum)
Patient ID: Megan Keith, female   DOB: 1960-10-28, 58 y.o.   MRN: [id number]    Advanced Heart Failure Rounding Note   Subjective:    Admitted for afib w/ RVR + A/C systolic HF. Initial plans were for PVI ablation but procedure canceled after TEE showed severely reduced EF at 5% and severe LAE. Pt underwent DCCV instead.    Yesterday switched to po lasix and amiodarone.   Denies SOB. Complaining of occasional nausea. Hungry this morning.    Objective:   Weight Range:  Vital Signs:   Temp:  [97.7 F (36.5 C)-98.5 F (36.9 C)] 97.7 F (36.5 C) (11/16 0453) Pulse Rate:  [81-106] 81 (11/16 0453) Resp:  [16-21] 18 (11/16 0453) BP: (82-105)/(63-80) 82/64 (11/16 0453) SpO2:  [94 %-99 %] 98 % (11/16 0453) Weight:  [61.3 kg] 61.3 kg (11/16 0453) Last BM Date: 07/06/19  Weight change: Filed Weights   07/07/19 0352 07/08/19 0348 07/09/19 0453  Weight: 61.2 kg 61.5 kg 61.3 kg    Intake/Output:   Intake/Output Summary (Last 24 hours) at 07/09/2019 0747 Last data filed at 07/09/2019 0456 Gross per 24 hour  Intake 918 ml  Output 500 ml  Net 418 ml    CVP 6-7  Physical Exam: General:  Sitting on the side of the bed. . No resp difficulty HEENT: normal anicteric  Neck: supple. JVP 6-7 . Carotids 2+ bilat; no bruits. No lymphadenopathy or thryomegaly appreciated. Cor: PMI nondisplaced. Tachy regualr. No rubs, or murmurs. + S3  Lungs: clear no wheeze Abdomen: soft, nontender, nondistended. No hepatosplenomegaly. No bruits or masses. Good bowel sounds. Extremities: no cyanosis, clubbing, rash, edema. Warm RUE PICC  Neuro: alert & oriented x 3, cranial nerves grossly intact. moves all 4 extremities w/o difficulty. Affect pleasant  Telemetry: Sinus Tach 100s personally reviewed.   Labs: Basic Metabolic Panel: Recent Labs  Lab 07/04/19 0342 07/05/19 0518 07/06/19 0427 07/06/19 1054 07/07/19 0518 07/08/19 0410 07/09/19 0430  NA 140 141 138  --  137 136 136  K  3.0* 4.1 3.3*  --  4.7 3.8 4.2  CL 104 105 103  --  99 100 101  CO2 24 23 25   --  25 25 25   GLUCOSE 86 93 87  --  94 90 92  BUN 13 15 11   --  16 15 13   CREATININE 0.71 0.73 0.71  --  0.71 0.76 0.72  CALCIUM 8.7* 9.2 9.0  --  9.3 9.3 9.2  MG 2.3  --   --  2.3  --   --   --     Liver Function Tests: Recent Labs  Lab 07/03/19 1609  AST 24  ALT 59*  ALKPHOS 108  BILITOT 0.8  PROT 6.8  ALBUMIN 4.0   No results for input(s): LIPASE, AMYLASE in the last 168 hours. No results for input(s): AMMONIA in the last 168 hours.  CBC: Recent Labs  Lab 07/03/19 1609  WBC 13.3*  HGB 12.6  HCT 40.1  MCV 94.8  PLT 420*    Cardiac Enzymes: No results for input(s): CKTOTAL, CKMB, CKMBINDEX, TROPONINI in the last 168 hours.  BNP: BNP (last 3 results) Recent Labs    06/06/19 0620 06/07/19 0105 06/08/19 0044  BNP 1,146.8* 1,294.9* 893.6*    ProBNP (last 3 results) No results for input(s): PROBNP in the last 8760 hours.    Other results:  Imaging: No results found.   Medications:     Scheduled Medications: . amiodarone  400  mg Oral BID  . anastrozole  1 mg Oral Daily  . apixaban  5 mg Oral BID  . Chlorhexidine Gluconate Cloth  6 each Topical Daily  . digoxin  0.125 mg Oral Daily  . furosemide  40 mg Oral Daily  . losartan  12.5 mg Oral QHS  . methimazole  10 mg Oral Daily  . multivitamin  1 tablet Oral Daily  . predniSONE  10 mg Oral Q breakfast  . sodium chloride flush  10-40 mL Intracatheter Q12H  . sodium chloride flush  3 mL Intravenous Q12H  . spironolactone  12.5 mg Oral Daily    Infusions: . sodium chloride    . sodium chloride      PRN Medications: sodium chloride, acetaminophen, guaiFENesin, ondansetron (ZOFRAN) IV, sodium chloride flush   Assessment/Plan:   1. Persistent Atrial Fibrillation w/ RVR: - rate in the 150s on admit, started on IV amiodarone - s/p previous DC-CV in 7/20. Recurrent AF with COVID  - previously was on amio for PVCs but  developed hyperthyroidism  - on Eliquis 5 mg bid - Initial plans were for PVI ablation this admit but procedure canceled after TEE showed severely reduced EF at 5% and severe LAE. Pt underwent DCCV instead 11/12.   - In Sinus Tach today.  - Continue amiodarone 400 mg po bid.  - Continue digoxin 0.125 mg  - Continue Eliquis  - Appreciate EP's assistance  2.Acute onChronic systolic [id number], normal cors in 03/2017. 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 03/2017 EF 15-20% Echo 07/28/17 EF 25-30%  - Echo 11/2017: EF 25-30%.  - Echo 06/2018 EF ~30% - Echo 7/20 in setting of PAF with RVR. EF 10% - Echo 8/20 EF 25-30% - CO-OX 60.5 %  - Volume status stable.  Lasix 40 mg po daily today.   - Continue losartan 12.5 mg daily, no room to up-titrate with soft SBP.   - Continue spironolactone 12.5 daily.  - Continue digoxin 0.125 mg  - Can get cMRI per Dr Aundra Dubin  - Suspecther CM is likely tachy-mediated (vs PVC).  Hopefully she will have some functional improvement in NSR.   - Not great candidate for advanced therapies, pharmacist did thorough home med review today and she appears to be quite noncompliant.   3. Hyperthyroidism - TSH low normal, 0.354. Free T3 WNL 3.7. Free elevated T4 1.35 - Continue methimazole  -  Continue amio despite amio-induced hyperthyroidism as it is our only option.  4.H/oFrequent PVCs - Zio patch8/20with 5% PVC burden  - SuspecttachymediatedCM. Amio previouslystopped due to hyperthyroidism. - Plan as above. Appreciate EP input  5. Left Breast Cancer. -treated w/XRT  6. Medication noncompliance -language barrier plays a role  7. Hypokalemia:  -Resolved.   Discussed medication compliance. We will need to try and set up pill box for her.  Lives in [place], unable to get HF Paramedicine. No insurance so she will need assistance with medications.   Consult cardiac rehab.   Length of Stay: [place] NP-C  07/09/2019, 7:47 AM  Advanced Heart  Failure Team Pager [phone] (M-F; 7a - 4p)  Please contact [facility] for night-coverage after hours (4p -7a ) and weekends on [email or web address]  Patient seen and examined with the above-signed Advanced Practice Provider and/or Housestaff. I personally reviewed laboratory data, imaging studies and relevant notes. I independently examined the patient and formulated the important aspects of the plan. I have edited the note to reflect any of my changes or salient points. I have  personally discussed the plan with the patient and/or family.  Feeling better but still quite tenuous. Remains in sinus but rate 100-110. CVP low. Co-ox stable. BP low today. Will hold lasix. Continue amio at 400 bid and Eliquis. Hopefully EF will recover with maintenance of NSR. Follow CVp and co-ox closely.   Glori Bickers, MD  2:36 PM

## 2019-07-09 NOTE — Plan of Care (Signed)

## 2019-07-09 NOTE — Progress Notes (Signed)
Pt's BP has been low 80's. Asymptomatic. MD is aware.

## 2019-07-09 NOTE — Progress Notes (Signed)
CARDIAC REHAB PHASE I   PRE:  Rate/Rhythm: 108 ST (90 SR before I came in)    BP: sitting 88/65    SaO2: 99 RA  MODE:  Ambulation: 830 ft   POST:  Rate/Rhythm: 123 ST    BP: sitting 102/73     SaO2: 99 RA  Pt feels well. Able to walk without major sx. HR up to 123 ST. She sts she can feel it a little but nothing like when she came in. BP low in bed but increased appropriately with ambulation. Took a long walk. Pt is very pleasant but education is difficult. We discussed through interpreter Graciela daily wts, low sodium diets, taking her meds, sx to watch for and walking. Good reception. I gave the [employer] book in [place] (no spanish available) for her family. I also gave her HF materials in [place]. Pt does not read but her family can read it to her. I spoke to a granddaughter on the phone who verified they will help her. She is going home with her daughter Delana Meyer. She needs a scale for home as hers broke recently. She will need Graciela to translate for medications. [place], ACSM 07/09/2019 12:26 PM

## 2019-07-10 LAB — COOXEMETRY PANEL
Carboxyhemoglobin: 1.7 % — ABNORMAL HIGH (ref 0.5–1.5)
Methemoglobin: 1 % (ref 0.0–1.5)
O2 Saturation: 62.9 %
Total hemoglobin: 13.6 g/dL (ref 12.0–16.0)

## 2019-07-10 LAB — BASIC METABOLIC PANEL
Anion gap: 11 (ref 5–15)
BUN: 14 mg/dL (ref 6–20)
CO2: 23 mmol/L (ref 22–32)
Calcium: 8.9 mg/dL (ref 8.9–10.3)
Chloride: 104 mmol/L (ref 98–111)
Creatinine, Ser: 0.68 mg/dL (ref 0.44–1.00)
GFR calc Af Amer: >60 mL/min (ref >60)
GFR calc non Af Amer: >60 mL/min (ref >60)
Glucose, Bld: 89 mg/dL (ref 70–99)
Potassium: 3.9 mmol/L (ref 3.5–5.1)
Sodium: 138 mmol/L (ref 135–145)

## 2019-07-10 LAB — CULTURE, BLOOD (ROUTINE X 2)
Culture: NO GROWTH
Culture: NO GROWTH
Special Requests: ADEQUATE
Special Requests: ADEQUATE

## 2019-07-10 MED ORDER — LOSARTAN POTASSIUM 25 MG PO TABS: 12.5000 mg | ORAL_TABLET | Freq: Every day | ORAL | 3 refills | Status: DC

## 2019-07-10 MED ORDER — FUROSEMIDE 40 MG PO TABS: 40.0000 mg | ORAL_TABLET | Freq: Every day | ORAL | 3 refills | Status: DC

## 2019-07-10 MED ORDER — APIXABAN 5 MG PO TABS: 5.0000 mg | ORAL_TABLET | Freq: Two times a day (BID) | ORAL | 11 refills | Status: DC

## 2019-07-10 MED ORDER — AMIODARONE HCL 400 MG PO TABS: 400.0000 mg | ORAL_TABLET | Freq: Two times a day (BID) | ORAL | 0 refills | Status: DC

## 2019-07-10 MED ORDER — SPIRONOLACTONE 25 MG PO TABS: 12.5000 mg | ORAL_TABLET | Freq: Every day | ORAL | 3 refills | Status: DC

## 2019-07-10 MED FILL — SPIRONOLACTONE 25 MG TABLET: 25 | 15 days supply | Qty: 15 | Fill #0

## 2019-07-10 MED FILL — LOSARTAN POTASSIUM 25 MG TA: 25 | 15 days supply | Qty: 15 | Fill #0

## 2019-07-10 MED FILL — AMIODARONE HCL 200 MG TAB: 200 | 30 days supply | Qty: 120 | Fill #0

## 2019-07-10 MED FILL — FUROSEMIDE 40 MG TABLET: 40 | 30 days supply | Qty: 30 | Fill #0

## 2019-07-10 NOTE — Progress Notes (Addendum)
CARDIAC REHAB PHASE I   PRE:  Rate/Rhythm: 100 ST    BP: sitting 93/66    SaO2:   MODE:  Ambulation: 1300 ft   POST:  Rate/Rhythm: 127 ST with PVC/PACs at end of walk    BP: sitting 89/66     SaO2:   Pt sts she is feeling well. Had a HA yesterday evening and didn't sleep well but seems ok today. Able to walk without dizziness or SOB, increased distance. HR mostly 118 ST walking but some ectopy at end of walk. Thankful for walk. No questions. [id number]   [place], ACSM 07/10/2019 11:08 AM

## 2019-07-10 NOTE — Discharge Summary (Addendum)
Advanced Heart Failure Team  Discharge Summary   Patient ID: Megan Keith MRN: [id number], DOB/AGE: 01/15/1961 58 y.o. 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   She presented to [facility] on [facility]/13[facility]17 with neck pain, SOB and orthopnea. Last EF prior to that reported by Dr. [name] was 35%. Repeat echo showed reduced EF of 15%, she was transferred to [facility] for further evaluation. Underwent right and left heart catheterization on 04/15/17, no CAD. Fick output/index 5.28/3.34. Full results below.   Noted to have >20% PVC on telemetry. She was started on IV Amio and transitioned to po Amio. Also started on digoxin and spiro prior to discharge. BP was too soft to add any other medications. Discharge weight was 143 pounds.   Admitted 7/13-18/2020 with ADHF in setting of new-onset AF in the setting of hyperthyroidism. TEE with EF 10% RV ok. Started on amiodarone and diuresed. Underwent TEE/DC-CV and felt much better. Also treated with methimazole and prednisone.   Seen again 04/2019.Weightwasup a few pounds. Zio patch placed to monitor PVC burden   Zio patch 8/20 - NSR avg HR 75 - 13 runs NSVT longest 14 beats - 59 runs SVT longest 9.4 seconds - PVC burden 4.8%  Admitted10/14-10/17 to [facility] for COVID 19 infection c/b gastroenteritis and recurrent afib w/ RVR. Was loaded on IV amiodaroneand IV digoxin and rate improved but did not convert back to NSR. Was  placed on Eliquis and discharged home on PO digoxin and metoprolol. Was advised to f/u in [facility] for further management.   Was seen in AHF clinic 11/10 for f/u and was in presistent rapid atrial fibrillation in the 150s. She denied palpitations but endorsed exertional dyspnea and was volume overloaded. Had been compliant with all meds except Eliquis. Unable to afford cost. Recently submitted paperwork for cost assistance and was approved but waiting on supply. She was directly admitted to [facility] for further management of rapid afib and treatment w/ IV diuretics for acute on chronic CHF. Was placed on IV amiodarone for afib. Eliquis resumed. TSH low normal, 0.354. Free T3 WNL 3.7. Free elevated T4 1.35. Methimazole and prednisone continued for hyperthyroidism.   EP was consulted. Initial plans were for PVI ablation but procedure canceled after TEE showed severely reduced EF at 5% and severe LAE. Pt underwent DCCV instead.  Converted to sinus tach. After IV amiodarone load, was switched to oral. She improved post conversion as well as w/ diuresis. Was switched from IV to PO Lasix. Co-ox was monitored and remained stable, 60-65%. No IV inotrope requirements. She was also treated w/ Losartan (BP too soft for Entesto), spironolactone and digoxin. Overall not felt to be a great candidate for advanced therapies, pharmacist did thorough home med review and she appears to be quite noncompliant.   On 11/17 pt was seen and examined by Dr. Haroldine Laws and felt to be stable for discharge home. Post hospital f/u has been arranged in [facility] on 07/17/19. She will f/u w/ the Atrial Fibrillation Clinic on 12/14 and  w/ Dr. Rayann Heman 09/2019. Per Dr. Rayann Heman, if she returns to AF with RVR over the short term,  would advise AV nodal ablation with CRT.   If she remains in sinus rhythm with amiodarone and her EF recovers, could consider elective afib ablation eventually (a few months from now).    Echo 03/2017 EF 10-15% Echo 07/2017 EF  25-30%  Echo 11/2017 EF 25-30% Echo 06/2018: EF ~30% (per Dr Haroldine Laws) Echo 8/20 EF 25-30% TEE 11/20 EF 5%   Discharge Weight Range: 135 lb Discharge Vitals: Blood pressure 97/76, pulse 91, temperature 98.4 F (36.9 C), temperature source Oral, resp. rate 20, height 4\' 8"  (1.422 m), weight 61.5 kg, SpO2 99 %.  Labs: Lab Results  Component Value Date   WBC 13.3 (H) 07/03/2019   HGB 12.6 07/03/2019   HCT 40.1 07/03/2019   MCV 94.8 07/03/2019   PLT 420 (H) 07/03/2019    Recent Labs  Lab 07/10/19 0430  NA 138  K 3.9  CL 104  CO2 23  BUN 14  CREATININE 0.68  CALCIUM 8.9  GLUCOSE 89   Lab Results  Component Value Date   CHOL 168 04/16/2017   HDL 52 04/16/2017   LDLCALC 103 (H) 04/16/2017   TRIG 67 04/16/2017   BNP (last 3 results) Recent Labs    06/06/19 0620 06/07/19 0105 06/08/19 0044  BNP 1,146.8* 1,294.9* 893.6*    ProBNP (last 3 results) No results for input(s): PROBNP in the last 8760 hours.   Diagnostic Studies/Procedures   No results found.  Discharge Medications   Allergies as of 07/10/2019   No Known Allergies     Medication List    STOP taking these medications   EQ VAPORIZING RUB EX   metoprolol succinate 25 MG 24 hr tablet Commonly known as: TOPROL-XL     TAKE these medications   acetaminophen 650 MG CR tablet Commonly known as: TYLENOL Take 650 mg by mouth every 8 (eight) hours as needed for pain.   amiodarone 400 MG tablet Commonly known as: PACERONE Take 1 tablet (400 mg total) by mouth 2 (two) times daily.   anastrozole 1 MG tablet Commonly known as: ARIMIDEX Take 1 mg by mouth daily.   apixaban 5 MG Tabs tablet Commonly known as: ELIQUIS Take 1 tablet (5 mg total) by mouth 2 (two) times daily.   D3-1000 25 MCG (1000 UT) tablet Generic drug: Cholecalciferol Take 1,000 Units by mouth daily.   digoxin 0.125 MG tablet Commonly known as: Lanoxin Take 1 tablet (0.125 mg total) by mouth daily.   furosemide 40 MG  tablet Commonly known as: LASIX Take 1 tablet (40 mg total) by mouth daily. Start taking on: July 11, 2019   loperamide 2 MG capsule Commonly known as: IMODIUM Take 2 capsules (4 mg total) by mouth every 6 (six) hours as needed for diarrhea or loose stools.   losartan 25 MG tablet Commonly known as: COZAAR Take 0.5 tablets (12.5 mg total) by mouth at bedtime.   methimazole 10 MG tablet Commonly known as: TAPAZOLE Take 1 tablet (10 mg total) by mouth daily.   predniSONE 10 MG tablet Commonly known as: DELTASONE Take 1 tablet (10 mg total) by mouth daily with breakfast.   spironolactone 25 MG tablet Commonly known as: ALDACTONE Take 0.5 tablets (12.5 mg total) by mouth daily. Start taking on: July 11, 2019   Vitamin B 12 500 MCG Tabs Take 1,000 mcg by mouth daily.   vitamin C 1000 MG tablet Take  1,000 mg by mouth daily.       Disposition   The patient will be discharged in stable condition to home.  Follow-up Information    [facility] Follow up.   Specialty: Cardiology Why: 08/06/2019 10:30AM Contact information: [address] [id number] [place]       Thompson Grayer, MD Follow up.   Specialty: Cardiology Why: 10/11/2019 @ 11:30AM Contact information: [address] [place] [address] [phone]        Kerin Perna, NP Follow up on 07/31/2019.   Specialty: Internal Medicine Why: @ 2:30 pm for Hospital follow up appointment. If you cannot make this scheduled appointment please call to reschdule.  Contact information: [address] [place] [address] [phone]        [facility] Follow up on 07/18/2019.   Specialty: Cardiology Why: at [place] information: [address] [id number] [place] [phone]            Duration of  Discharge Encounter: Greater than 35 minutes   Signed, Nelida Gores 07/10/2019, 6:09 PM   Agree with above. Remains in sinus though RH a bit fast. Co-ox ok. Feels good. OK for d/c home today with very close f/u in HF Clinic.   Glori Bickers, MD  2:51 PM

## 2019-07-10 NOTE — TOC Transition Note (Addendum)
Transition of Care [facility]) - CM/SW Discharge Note   Patient Details  Name: Megan Keith MRN: [id number] Date of Birth: 07/28/1961  [facility] CM/SW Contact:  Zenon Mayo, RN Phone Number: 07/10/2019, 1:38 PM   Clinical Narrative:    Patient for dc today, she has had her CHF teaching with staff RN and interpreter.  She does not have insurance, Heart Failure to fill her medications.  She is new to eliquis. Patient has eliquis at home from previous admit where she did do the patient ast application, they send eliquis to home every month  Per patient.   Pharmacist Janett Billow  discussed with patient with interpreter.    Final next level of care: Home/Self Care Barriers to Discharge: No Barriers Identified   Patient Goals and CMS Choice Patient states their goals for this hospitalization and ongoing recovery are:: home CMS Medicare.gov Compare Post Acute Care list provided to:: Patient Choice offered to / list presented to : NA  Discharge Placement                       Discharge Plan and Services In-house Referral: NA Discharge Planning Services: CM Consult, Medication Assistance, [place] Program Post Acute Care Choice: Home Health          DME Arranged: (NA)         HH Arranged: NA [place] Agency: [facility] (formerly [employer]) Date [place]: 07/05/19 Time [place]: [place] Representative spoke with at [place]: [place] ([place]) Interventions     Readmission Risk Interventions No flowsheet data found.

## 2019-07-10 NOTE — Progress Notes (Addendum)
Patient ID: Megan Keith, female   DOB: [birth year], 58 y.o.   MRN: [id number]    Advanced Heart Failure Rounding Note   Subjective:    Admitted for afib w/ RVR + A/C systolic HF. Initial plans were for PVI ablation but procedure canceled after TEE showed severely reduced EF at 5% and severe LAE. Pt underwent DCCV instead.    Lasix held yesterday given hypotension. BP improved, but soft in low [id number] systolic. MAPs 70s-80s. PO lasix given today.   Co-ox 63%. CVP 2. Wt unchanged at 135 lb.  Remains in sinus tach. HR low 100s at rest and increase into the 120s w/ ambulation. Worked w/ cardiac rehab this am w/o exertional dyspnea.    Objective:   Weight Range:  Vital Signs:   Temp:  [97.6 F (36.4 C)-98.3 F (36.8 C)] 98.3 F (36.8 C) (11/17 0802) Pulse Rate:  [84-90] 84 (11/17 0456) Resp:  [12-18] 12 (11/17 0802) BP: (90-101)/(63-73) 101/73 (11/17 0802) SpO2:  [98 %-99 %] 98 % (11/17 0802) Weight:  [61.5 kg] 61.5 kg (11/17 0456) Last BM Date: 07/10/19  Weight change: Filed Weights   07/08/19 0348 07/09/19 0453 07/10/19 0456  Weight: 61.5 kg 61.3 kg 61.5 kg    Intake/Output:   Intake/Output Summary (Last 24 hours) at 07/10/2019 1048 Last data filed at 07/10/2019 0830 Gross per 24 hour  Intake 1040 ml  Output -  Net 1040 ml     Physical Exam: PHYSICAL EXAM: General:  Well appearing Hispanic female. No respiratory difficulty HEENT: normal Neck: supple. no JVD. Carotids 2+ bilat; no bruits. No lymphadenopathy or thyromegaly appreciated. Cor: PMI nondisplaced. Regular rhythm, tachy rate. No rubs, gallops or murmurs. Lungs: clear Abdomen: soft, nontender, nondistended. No hepatosplenomegaly. No bruits or masses. Good bowel sounds. Extremities: no cyanosis, clubbing, rash, edema Neuro: alert & oriented x 3, cranial nerves grossly intact. moves all 4 extremities w/o difficulty. Affect pleasant.   Telemetry: Sinus Tach low 100s at rest, up into 120s ambulating  personally reviewed.   Labs: Basic Metabolic Panel: Recent Labs  Lab 07/04/19 0342  07/06/19 0427 07/06/19 1054 07/07/19 0518 07/08/19 0410 07/09/19 0430 07/10/19 0430  NA 140   < > 138  --  137 136 136 138  K 3.0*   < > 3.3*  --  4.7 3.8 4.2 3.9  CL 104   < > 103  --  99 100 101 104  CO2 24   < > 25  --  25 25 25 23   GLUCOSE 86   < > 87  --  94 90 92 89  BUN 13   < > 11  --  16 15 13 14   CREATININE 0.71   < > 0.71  --  0.71 0.76 0.72 0.68  CALCIUM 8.7*   < > 9.0  --  9.3 9.3 9.2 8.9  MG 2.3  --   --  2.3  --   --   --   --    < > = values in this interval not displayed.    Liver Function Tests: Recent Labs  Lab 07/03/19 1609  AST 24  ALT 59*  ALKPHOS 108  BILITOT 0.8  PROT 6.8  ALBUMIN 4.0   No results for input(s): LIPASE, AMYLASE in the last 168 hours. No results for input(s): AMMONIA in the last 168 hours.  CBC: Recent Labs  Lab 07/03/19 1609  WBC 13.3*  HGB 12.6  HCT 40.1  MCV 94.8  PLT 420*  Cardiac Enzymes: No results for input(s): CKTOTAL, CKMB, CKMBINDEX, TROPONINI in the last 168 hours.  BNP: BNP (last 3 results) Recent Labs    06/06/19 0620 06/07/19 0105 06/08/19 0044  BNP 1,146.8* 1,294.9* 893.6*    ProBNP (last 3 results) No results for input(s): PROBNP in the last 8760 hours.    Other results:  Imaging: No results found.   Medications:     Scheduled Medications: . amiodarone  400 mg Oral BID  . anastrozole  1 mg Oral Daily  . apixaban  5 mg Oral BID  . Chlorhexidine Gluconate Cloth  6 each Topical Daily  . digoxin  0.125 mg Oral Daily  . furosemide  40 mg Oral Daily  . losartan  12.5 mg Oral QHS  . methimazole  10 mg Oral Daily  . multivitamin  1 tablet Oral Daily  . predniSONE  10 mg Oral Q breakfast  . sodium chloride flush  10-40 mL Intracatheter Q12H  . sodium chloride flush  3 mL Intravenous Q12H  . spironolactone  12.5 mg Oral Daily    Infusions: . sodium chloride    . sodium chloride      PRN  Medications: sodium chloride, acetaminophen, guaiFENesin, ondansetron (ZOFRAN) IV, sodium chloride flush   Assessment/Plan:   1. Persistent Atrial Fibrillation w/ RVR: - rate in the 150s on admit, started on IV amiodarone - s/p previous DC-CV in 7/20. Recurrent AF with COVID  - previously was on amio for PVCs but developed hyperthyroidism  - on Eliquis 5 mg bid - Initial plans were for PVI ablation this admit but procedure canceled after TEE showed severely reduced EF at 5% and severe LAE. Pt underwent DCCV instead 11/12.   - In Sinus Tach today, low 100s at rest, 120s w/ ambulation.  - Continue amiodarone 400 mg po bid.  - Continue digoxin 0.125 mg  - May benefit from low dose Toprol XL given coexistent hyperthyroidism and systolic HF.  - Continue Eliquis 5 bid - Appreciate EP's assistance  2.Acute onChronic systolic [id number], normal cors in 03/2017. 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 03/2017 EF 15-20% Echo 07/28/17 EF 25-30%  - Echo 11/2017: EF 25-30%.  - Echo 06/2018 EF ~30% - Echo 7/20 in setting of PAF with RVR. EF 10% - Echo 8/20 EF 25-30% - CO-OX 63 %  - Volume status stable. Wt stable at 135 lb. Lasix held yesterday for low BP but given today. Continue 40 mg daily.  - Continue spironolactone 12.5 daily.  - Continue digoxin 0.125 mg  - Suspecther CM is likely tachy-mediated (vs PVC).  Hopefully she will have some functional improvement in NSR.   - Despite SR, she has persistent tachycardia. May benefit from low dose Toprol XL given coexistent hyperthyroidism and systolic HF. Given soft BP, may need to d/c losartan to allow more BP room for  blocker to achieve adequate rate control.  - Not great candidate for advanced therapies, pharmacist did thorough home med review today and she appears to be quite noncompliant.   3. Hyperthyroidism - TSH low normal, 0.354. Free T3 WNL 3.7. Free elevated T4 1.35 - Continue methimazole  -  Continue amio despite amio-induced hyperthyroidism  as it is our only option. - consider addition of  blocker per above.   4.H/oFrequent PVCs - Zio patch8/20with 5% PVC burden  - SuspecttachymediatedCM. Amio previouslystopped due to hyperthyroidism. - Plan as above. Appreciate EP input  5. Left Breast Cancer. -treated w/XRT  6. Medication noncompliance -language barrier plays a role  7. Hypokalemia:  -Resolved.   Dispo: possible d/c home later today   Length of Stay: [place] NP-C  07/10/2019, 10:48 AM  Advanced Heart Failure Team Pager [phone] (M-F; 7a - 4p)  Please contact [facility] for night-coverage after hours (4p -7a ) and weekends on [email or web address]   Patient seen and examined with the above-signed Advanced Practice Provider and/or Housestaff. I personally reviewed laboratory data, imaging studies and relevant notes. I independently examined the patient and formulated the important aspects of the plan. I have edited the note to reflect any of my changes or salient points. I have personally discussed the plan with the patient and/or family.  She remains in NSR. Rate still a bit fast but is tolerating. Volume status and co-ox look good. Dig level ok. Will let her go home today on current meds  with very close f/u in HF Clinic.    Glori Bickers, MD  12:44 PM

## 2019-07-16 ENCOUNTER — Institutional Professional Consult (permissible substitution): Payer: Self-pay | Admitting: Cardiology

## 2019-07-17 ENCOUNTER — Telehealth (HOSPITAL_COMMUNITY): Payer: Self-pay

## 2019-07-17 NOTE — Telephone Encounter (Signed)
Called Patient for Pre Covid Screening with no answer and Patient has not set up her voicemail.

## 2019-07-18 ENCOUNTER — Encounter (HOSPITAL_COMMUNITY): Payer: Self-pay

## 2019-07-18 ENCOUNTER — Ambulatory Visit (HOSPITAL_COMMUNITY)
Admit: 2019-07-18 | Discharge: 2019-07-18 | Disposition: A | Discharge status: Home / Self Care | Payer: Self-pay | Source: Ambulatory Visit | Attending: Cardiology | Admitting: Cardiology

## 2019-07-18 ENCOUNTER — Other Ambulatory Visit: Payer: Self-pay

## 2019-07-18 VITALS — BP 103/64 | HR 106 | Wt 138.8 lb

## 2019-07-18 DIAGNOSIS — I5023 Acute on chronic systolic (congestive) heart failure: Secondary | ICD-10-CM | POA: Insufficient documentation

## 2019-07-18 DIAGNOSIS — R002 Palpitations: Secondary | ICD-10-CM | POA: Insufficient documentation

## 2019-07-18 DIAGNOSIS — I5022 Chronic systolic (congestive) heart failure: Secondary | ICD-10-CM

## 2019-07-18 DIAGNOSIS — I428 Other cardiomyopathies: Secondary | ICD-10-CM | POA: Insufficient documentation

## 2019-07-18 DIAGNOSIS — Z7901 Long term (current) use of anticoagulants: Secondary | ICD-10-CM | POA: Insufficient documentation

## 2019-07-18 DIAGNOSIS — E0590 Thyrotoxicosis, unspecified without thyrotoxic crisis or storm: Secondary | ICD-10-CM

## 2019-07-18 DIAGNOSIS — I471 Supraventricular tachycardia: Secondary | ICD-10-CM | POA: Insufficient documentation

## 2019-07-18 DIAGNOSIS — I493 Ventricular premature depolarization: Secondary | ICD-10-CM | POA: Insufficient documentation

## 2019-07-18 DIAGNOSIS — Z79899 Other long term (current) drug therapy: Secondary | ICD-10-CM | POA: Insufficient documentation

## 2019-07-18 DIAGNOSIS — Z9119 Patient's noncompliance with other medical treatment and regimen: Secondary | ICD-10-CM | POA: Insufficient documentation

## 2019-07-18 DIAGNOSIS — Z9114 Patient's other noncompliance with medication regimen: Secondary | ICD-10-CM | POA: Insufficient documentation

## 2019-07-18 DIAGNOSIS — U071 COVID-19: Secondary | ICD-10-CM

## 2019-07-18 DIAGNOSIS — C50912 Malignant neoplasm of unspecified site of left female breast: Secondary | ICD-10-CM | POA: Insufficient documentation

## 2019-07-18 DIAGNOSIS — Z8249 Family history of ischemic heart disease and other diseases of the circulatory system: Secondary | ICD-10-CM | POA: Insufficient documentation

## 2019-07-18 DIAGNOSIS — I4819 Other persistent atrial fibrillation: Secondary | ICD-10-CM

## 2019-07-18 LAB — BASIC METABOLIC PANEL
Anion gap: 10 (ref 5–15)
BUN: 12 mg/dL (ref 6–20)
CO2: 23 mmol/L (ref 22–32)
Calcium: 9.2 mg/dL (ref 8.9–10.3)
Chloride: 105 mmol/L (ref 98–111)
Creatinine, Ser: 0.69 mg/dL (ref 0.44–1.00)
GFR calc Af Amer: >60 mL/min (ref >60)
GFR calc non Af Amer: >60 mL/min (ref >60)
Glucose, Bld: 104 mg/dL — ABNORMAL HIGH (ref 70–99)
Potassium: 3.8 mmol/L (ref 3.5–5.1)
Sodium: 138 mmol/L (ref 135–145)

## 2019-07-18 MED ORDER — AMIODARONE HCL 200 MG PO TABS: 200.0000 mg | ORAL_TABLET | Freq: Two times a day (BID) | ORAL | 3 refills | Status: DC

## 2019-07-18 MED ORDER — SPIRONOLACTONE 25 MG PO TABS: 12.5000 mg | ORAL_TABLET | Freq: Every day | ORAL | 3 refills | Status: DC

## 2019-07-18 MED ORDER — DIGOXIN 125 MCG PO TABS: 0.1250 mg | ORAL_TABLET | Freq: Every day | ORAL | 11 refills | Status: DC

## 2019-07-18 NOTE — Patient Instructions (Signed)
Routine lab work today. Will notify you of abnormal results  Keep appointment with Dr.Bensimhon on 07/27/2019

## 2019-07-18 NOTE — Progress Notes (Addendum)
Advanced Heart Failure Clinic Note   Referring Physician: PCP: Kerin Perna, NP PCP-Cardiologist: Glori Bickers, MD  EP: Dr. Rayann Heman   Reason for Visit: 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   She presented to [facility] on [facility]17 with neck pain, SOB and orthopnea. Last EF prior to that reported by Dr. [name] was 35%. Repeat echo showed reduced EF of 15%, she was transferred to [facility] for further evaluation. Underwent right and left heart catheterization on 04/15/17, no CAD. Fick output/index 5.28/3.34. Full results below.   Noted to have >20% PVC on telemetry. She was started on IV Amio and transitioned to po Amio. Also started on digoxin and spiro prior to discharge. BP was too soft to add any other medications. Discharge weight was 143 pounds.   Admitted 7/13-18/2020 with ADHF in setting of new-onset AF in the setting of hyperthyroidism. TEE with EF 10% RV ok. Started on amiodarone and diuresed. Underwent TEE/DC-CV and felt much better. Also treated with methimazole and prednisone.   Seen again 04/2019.Weightwasup a few pounds. Zio patch placed to monitor PVC burden   Zio patch 8/20 - NSR avg HR 75 - 13 runs NSVT longest 14 beats - 59 runs SVT longest 9.4 seconds - PVC burden 4.8%  Admitted10/14-10/17 to [facility] for COVID 19 infection c/b gastroenteritis and recurrent afib w/ RVR. Was loaded on IV amiodaroneand IV digoxin and rate improved but did not convert back to NSR. Was placed on Eliquis and discharged home on PO digoxin and metoprolol. Was advised to f/u in [facility] for further management.   Was seen in AHF clinic 11/10 for f/u and was in presistent rapid atrial fibrillation in the 150s. She denied palpitations but endorsed exertional dyspnea and was  volume overloaded. Had been compliant with all meds except Eliquis. Unable to afford cost. Recently submitted paperwork for cost assistance and was approved but waiting on supply. She was directly admitted to [facility] for further management of rapid afib and treatment w/ IV diuretics for acute on chronic CHF. Was placed on IV amiodarone for afib. Eliquis resumed. TSH low normal, 0.354. Free T3 WNL 3.7. Free elevated T4 1.35. Methimazole and prednisone continued for hyperthyroidism.   EP was consulted. Initial plans were for PVI ablation but procedure canceled after TEE showed severely reduced EF at 5% and severe LAE. Pt underwent DCCV instead. Converted to sinus tach. After IV amiodarone load, was switched to oral. She improved post conversion as well as w/ diuresis. Was switched from IV to PO Lasix. Co-ox was monitored and remained stable, 60-65%. No IV inotrope requirements. She was also treated w/ Losartan (BP too soft for Entesto), spironolactone and digoxin. Overall not felt to be a great candidate for advanced therapies, pharmacist did thorough home med review and she appears to be quite noncompliant.   She was discharged home onn 11/17. She will f/u w/ the Atrial Fibrillation Clinic on 12/14 and w/ Dr. Rayann Heman 09/2019. Per Dr. Rayann Heman, if she returns to AF with RVR over the short term,  would advise AV nodal ablation with CRT. If she remains in sinus rhythm with amiodarone and her EF recovers, could consider elective afib ablation eventually (a few months from now).   She presents to f/u w/ interpreter and her granddaughter. Reports she has been doing  fairly well. Notes occasional palpitations but no resting dyspnea or CP. Has mild dyspnea w/ mild-moderate activity. EKG today shows sinus tach 105 bmp. BP 103/64. Wt up 3 lb since discharge, 135>>138 lb. She has been taking all of her meds except spironolactone and digoxin. Claims she was not aware to take them although outlined on her discharge AVS.     Cardiac Studies   Echo 03/2017 EF 10-15% Echo 07/2017 EF 25-30%  Echo 11/2017 EF 25-30% Echo 06/2018: EF ~30% (per Dr Haroldine Laws) Echo 8/20 EF 25-30% TEE 11/20 EF 5%  R/LHC 8/ 018 LHC - normal cors  Right Heart  Right Heart Pressures RA (mean): 5 mmHg RV (S/EDP): 52/4 mmHg PA (S/D, mean): 52/20 (31) mmHg PCWP (mean): 30 mmHg  Ao sat: 96% PA sat: 71% RA sat: 74%  Fick CO: 5.3 L/min Fick CI: 3.3 L/min/m^2  SVR: 1,101 dynscm-5      Review of Systems: [y] = yes, [ ]  = no   General: Weight gain [ ] ; Weight loss [ ] ; Anorexia [ ] ; Fatigue [ ] ; Fever [ ] ; Chills [ ] ; Weakness [ ]   Cardiac: Chest pain/pressure [ ] ; Resting SOB [ ] ; Exertional SOB [ ] ; Orthopnea [ ] ; Pedal Edema [ ] ; Palpitations [ ] ; Syncope [ ] ; Presyncope [ ] ; Paroxysmal nocturnal dyspnea[ ]   Pulmonary: Cough [ ] ; Wheezing[ ] ; Hemoptysis[ ] ; Sputum [ ] ; Snoring [ ]   GI: Vomiting[ ] ; Dysphagia[ ] ; Melena[ ] ; Hematochezia [ ] ; Heartburn[ ] ; Abdominal pain [ ] ; Constipation [ ] ; Diarrhea [ ] ; BRBPR [ ]   GU: Hematuria[ ] ; Dysuria [ ] ; Nocturia[ ]   Vascular: Pain in legs with walking [ ] ; Pain in feet with lying flat [ ] ; Non-healing sores [ ] ; Stroke [ ] ; TIA [ ] ; Slurred speech [ ] ;  Neuro: Headaches[ ] ; Vertigo[ ] ; Seizures[ ] ; Paresthesias[ ] ;Blurred vision [ ] ; Diplopia [ ] ; Vision changes [ ]   Ortho/Skin: Arthritis [ ] ; Joint pain [ ] ; Muscle pain [ ] ; Joint swelling [ ] ; Back Pain [ ] ; Rash [ ]   Psych: Depression[ ] ; Anxiety[ ]   Heme: Bleeding problems [ ] ; Clotting disorders [ ] ; Anemia [ ]   Endocrine: Diabetes [ ] ; Thyroid dysfunction[ ]    Past Medical History:  Diagnosis Date  . Abnormal TSH   . Anemia   . Arthritis    "knees, hands" (04/15/2017)  . Breast cancer ([place])   . Chronic knee pain   . Chronic systolic CHF (congestive heart failure) ([place])   . COVID-19   . Does not have health insurance   . Frequent PVCs   . Hyperthyroidism 03/05/2019  . Hyperthyroidism 03/05/2019  . Left ventricular  thrombus without MI ([place]) 04/15/2017  . NICM (nonischemic cardiomyopathy) ([place])    a. ? PVC cardiomyopathy. right and left heart catheterization on 04/15/17, no CAD. Fick output/index 5.28/3.34.  . Noncompliance    a. episodic noncompliance.  . Pneumonia 06/2016    Current Outpatient Medications  Medication Sig Dispense Refill  . acetaminophen (TYLENOL) 650 MG CR tablet Take 650 mg by mouth every 8 (eight) hours as needed for pain.    Marland Kitchen amiodarone (PACERONE) 200 MG tablet Take 1 tablet (200 mg total) by mouth 2 (two) times daily. 60 tablet 3  . anastrozole (ARIMIDEX) 1 MG tablet Take 1 mg by mouth daily.    Marland Kitchen apixaban (ELIQUIS) 5 MG TABS tablet Take 1 tablet (5 mg total) by mouth 2 (two) times daily. 60 tablet 11  . Ascorbic Acid (VITAMIN C)  1000 MG tablet Take 1,000 mg by mouth daily.    . Cholecalciferol (D3-1000) 25 MCG (1000 UT) tablet Take 1,000 Units by mouth daily.    . Cyanocobalamin (VITAMIN B 12) 500 MCG TABS Take 1,000 mcg by mouth daily.     . furosemide (LASIX) 40 MG tablet Take 1 tablet (40 mg total) by mouth daily. 30 tablet 3  . losartan (COZAAR) 25 MG tablet Take 0.5 tablets (12.5 mg total) by mouth at bedtime. 30 tablet 3  . digoxin (LANOXIN) 0.125 MG tablet Take 1 tablet (0.125 mg total) by mouth daily. 30 tablet 11  . loperamide (IMODIUM) 2 MG capsule Take 2 capsules (4 mg total) by mouth every 6 (six) hours as needed for diarrhea or loose stools. (Patient not taking: Reported on 07/18/2019) 15 capsule 0  . methimazole (TAPAZOLE) 10 MG tablet Take 1 tablet (10 mg total) by mouth daily. (Patient not taking: Reported on 07/18/2019) 30 tablet 11  . predniSONE (DELTASONE) 10 MG tablet Take 1 tablet (10 mg total) by mouth daily with breakfast. (Patient not taking: Reported on 07/18/2019) 30 tablet 6  . spironolactone (ALDACTONE) 25 MG tablet Take 0.5 tablets (12.5 mg total) by mouth daily. 30 tablet 3   No current facility-administered medications for this encounter.     No  Known Allergies    Social History   Socioeconomic History  . Marital status: Single    Spouse name: Not on file  . Number of children: Not on file  . Years of education: Not on file  . Highest education level: Not on file  Occupational History  . Not on file  Social Needs  . Financial resource strain: Not on file  . Food insecurity    Worry: Patient refused    Inability: Patient refused  . Transportation needs    Medical: Patient refused    Non-medical: Patient refused  Tobacco Use  . Smoking status: Never Smoker  . Smokeless tobacco: Never Used  Substance and Sexual Activity  . Alcohol use: Yes    Comment: 04/15/2017 "might have a couple beers q couple months"  . Drug use: No  . Sexual activity: Not Currently  Lifestyle  . Physical activity    Days per week: Patient refused    Minutes per session: Patient refused  . Stress: Only a little  Relationships  . Social Herbalist on phone: Patient refused    Gets together: Patient refused    Attends religious service: Patient refused    Active member of club or organization: Patient refused    Attends meetings of clubs or organizations: Patient refused    Relationship status: Patient refused  . Intimate partner violence    Fear of current or ex partner: Patient refused    Emotionally abused: Patient refused    Physically abused: Patient refused    Forced sexual activity: Patient refused  Other Topics Concern  . Not on file  Social History Narrative   N/A      Family History  Problem Relation Age of Onset  . Hypertension Mother     Vitals:   07/18/19 0833  BP: 103/64  Pulse: (!) 106  SpO2: 99%  Weight: 63 kg (138 lb 12.8 oz)     PHYSICAL EXAM: General:  Well appearing Hispanic female. No respiratory difficulty HEENT: normal Neck: supple. no JVD. Carotids 2+ bilat; no bruits. No lymphadenopathy or thyromegaly appreciated. Cor: PMI nondisplaced. Regular rhythm, mildly tachy rate. No rubs,  gallops  or murmurs. Lungs: clear Abdomen: soft, nontender, nondistended. No hepatosplenomegaly. No bruits or masses. Good bowel sounds. Extremities: no cyanosis, clubbing, rash, edema Neuro: alert & oriented x 3, cranial nerves grossly intact. moves all 4 extremities w/o difficulty. Affect pleasant.  ECG: sinus tach 104 bpm    ASSESSMENT & PLAN:  1. Atrial Fibrillation:  - s/p previous DC-CV in 7/20. 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  Previously was on amio for PVCs but developed hyperthyroidism, now being treated w/ methimazole. Admitted 06/2019 for persistent afib w/ RVR + a/c systolic CHF. Loaded w/ IV amiodarone and evaluated by EP. Initial plans were for PVI ablation but procedure canceled after TEE showed severely reduced EF at 5% and severe LAE. Pt underwent DCCV instead. Converted to sinus tach. Per Dr. Rayann Heman, if she returns to AF with RVR over the short term,  would advise AV nodal ablation with CRT. If she remains in sinus rhythm with amiodarone and her EF recovers, could consider elective afib ablation eventually (a few months from now).  - Remains in Sinus Rhythm. Mild sinus tach 104 but not taking digoxin as instructed.  - Add digoxin back, 0.125 mg daily - Avoiding  blockers given severely reduced EF - Continue amiodarone. Wean down to 200 mg bid. Keep f/u w/ AFib clinic 12/14  - Continue Eliquis 5 mg bid   2.Acute onChronic systolic [id number], normal cors in 03/2017. 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 03/2017 EF 15-20% Echo 07/28/17 EF 25-30%  - Echo 11/2017: EF 25-30%.  - Echo 06/2018 EF ~30% - Echo 7/20 in setting of PAF with RVR. EF 10% - Echo 8/20 EF 25-30% - TEE 11/20 EF 5% - Suspecther CM is likely tachy-mediated (vs PVC).  Hopefully she will have some functional improvement in NSR.   - Volume status stable. Wt stable up 3 lb since discharge, 135>>138 lb. NYHA class II-III.  - Continue Lasix 40 mg daily  - Continue Losartan 12.5 mg daily (BP too soft for  Entresto) - Add back spironolactone 12.5 daily.  - Add back digoxin 0.125 mg  -BMP today and again on return visit w/ MD 12/4. - Not great candidate for advanced therapies, pharmacist did thorough home med review today and she appears to be quite noncompliant.   3. Hyperthyroidism - Recent TFTs 11/20: TSH low normal, 0.354. Free T3 WNL 3.7. Free elevated T4 1.35 - Continue methimazole  - Continue amio despite amio-induced hyperthyroidism as it is our only option.   4.H/oFrequent PVCs - Zio patch8/20with 5% PVC burden  - SuspecttachymediatedCM. Amio previouslystopped due to hyperthyroidism. - Amio restarted per above.   5. Left Breast Cancer. -treated w/XRT  6. Medication noncompliance -language barrier plays a role   Keep F/u w/ Dr. Haroldine Laws on 12/4 and w/ Afib Clinic on 12/14.     Lyda Jester, PA-C 07/18/19

## 2019-07-27 ENCOUNTER — Other Ambulatory Visit: Payer: Self-pay

## 2019-07-27 ENCOUNTER — Encounter (HOSPITAL_COMMUNITY): Payer: Self-pay | Admitting: Internal Medicine

## 2019-07-27 ENCOUNTER — Ambulatory Visit (HOSPITAL_COMMUNITY)
Admission: RE | Admit: 2019-07-27 | Discharge: 2019-07-27 | Disposition: A | Discharge status: Home / Self Care | Payer: Self-pay | Source: Ambulatory Visit | Attending: Internal Medicine | Admitting: Internal Medicine

## 2019-07-27 VITALS — BP 110/62 | HR 101 | Wt 138.0 lb

## 2019-07-27 DIAGNOSIS — Z8249 Family history of ischemic heart disease and other diseases of the circulatory system: Secondary | ICD-10-CM | POA: Insufficient documentation

## 2019-07-27 DIAGNOSIS — U071 COVID-19: Secondary | ICD-10-CM | POA: Insufficient documentation

## 2019-07-27 DIAGNOSIS — E0590 Thyrotoxicosis, unspecified without thyrotoxic crisis or storm: Secondary | ICD-10-CM | POA: Insufficient documentation

## 2019-07-27 DIAGNOSIS — I5023 Acute on chronic systolic (congestive) heart failure: Secondary | ICD-10-CM | POA: Insufficient documentation

## 2019-07-27 DIAGNOSIS — Z7901 Long term (current) use of anticoagulants: Secondary | ICD-10-CM | POA: Insufficient documentation

## 2019-07-27 DIAGNOSIS — Z923 Personal history of irradiation: Secondary | ICD-10-CM | POA: Insufficient documentation

## 2019-07-27 DIAGNOSIS — I480 Paroxysmal atrial fibrillation: Secondary | ICD-10-CM | POA: Insufficient documentation

## 2019-07-27 DIAGNOSIS — I428 Other cardiomyopathies: Secondary | ICD-10-CM | POA: Insufficient documentation

## 2019-07-27 DIAGNOSIS — Z79899 Other long term (current) drug therapy: Secondary | ICD-10-CM | POA: Insufficient documentation

## 2019-07-27 DIAGNOSIS — Z8701 Personal history of pneumonia (recurrent): Secondary | ICD-10-CM | POA: Insufficient documentation

## 2019-07-27 DIAGNOSIS — I493 Ventricular premature depolarization: Secondary | ICD-10-CM | POA: Insufficient documentation

## 2019-07-27 DIAGNOSIS — Z853 Personal history of malignant neoplasm of breast: Secondary | ICD-10-CM | POA: Insufficient documentation

## 2019-07-27 DIAGNOSIS — M549 Dorsalgia, unspecified: Secondary | ICD-10-CM | POA: Insufficient documentation

## 2019-07-27 DIAGNOSIS — I5022 Chronic systolic (congestive) heart failure: Secondary | ICD-10-CM

## 2019-07-27 DIAGNOSIS — Z8619 Personal history of other infectious and parasitic diseases: Secondary | ICD-10-CM | POA: Insufficient documentation

## 2019-07-27 DIAGNOSIS — Z9114 Patient's other noncompliance with medication regimen: Secondary | ICD-10-CM | POA: Insufficient documentation

## 2019-07-27 LAB — CBC
HCT: 44 % (ref 36.0–46.0)
Hemoglobin: 13.9 g/dL (ref 12.0–15.0)
MCH: 29.2 pg (ref 26.0–34.0)
MCHC: 31.6 g/dL (ref 30.0–36.0)
MCV: 92.4 fL (ref 80.0–100.0)
Platelets: 349 10*3/uL (ref 150–400)
RBC: 4.76 MIL/uL (ref 3.87–5.11)
RDW: 15.3 % (ref 11.5–15.5)
WBC: 9.7 10*3/uL (ref 4.0–10.5)
nRBC: 0 % (ref 0.0–0.2)

## 2019-07-27 LAB — BASIC METABOLIC PANEL
Anion gap: 10 (ref 5–15)
BUN: 14 mg/dL (ref 6–20)
CO2: 24 mmol/L (ref 22–32)
Calcium: 9.1 mg/dL (ref 8.9–10.3)
Chloride: 104 mmol/L (ref 98–111)
Creatinine, Ser: 0.68 mg/dL (ref 0.44–1.00)
GFR calc Af Amer: >60 mL/min (ref >60)
GFR calc non Af Amer: >60 mL/min (ref >60)
Glucose, Bld: 108 mg/dL — ABNORMAL HIGH (ref 70–99)
Potassium: 3.6 mmol/L (ref 3.5–5.1)
Sodium: 138 mmol/L (ref 135–145)

## 2019-07-27 LAB — BRAIN NATRIURETIC PEPTIDE: B Natriuretic Peptide: 286.3 pg/mL — ABNORMAL HIGH (ref 0.0–100.0)

## 2019-07-27 LAB — DIGOXIN LEVEL: Digoxin Level: 0.8 ng/mL (ref 0.8–2.0)

## 2019-07-27 MED ORDER — LOSARTAN POTASSIUM 25 MG PO TABS: 25.0000 mg | ORAL_TABLET | Freq: Every day | ORAL | 3 refills | Status: DC

## 2019-07-27 MED ORDER — AMIODARONE HCL 200 MG PO TABS: 200.0000 mg | ORAL_TABLET | Freq: Every day | ORAL | 3 refills | Status: DC

## 2019-07-27 NOTE — Progress Notes (Signed)
Advanced Heart Failure Clinic Note   Referring Physician: PCP: Kerin Perna, NP PCP-Cardiologist: Glori Bickers, MD  EP: Dr. 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   She presented to [facility] on 04/13/17 with neck pain, SOB and orthopnea. Last EF prior to that reported by Dr. [name] was 35%. Repeat echo showed reduced EF of 15%, she was transferred to [facility] for further evaluation. Underwent right and left heart catheterization on 04/15/17, no CAD. Fick output/index 5.28/3.34. Full results below.   Noted to have >20% PVC on telemetry. She was started on IV Amio and transitioned to po Amio. Also started on digoxin and spiro prior to discharge. BP was too soft to add any other medications. Discharge weight was 143 pounds.   Admitted 7/13-18/2020 with ADHF in setting of new-onset AF in the setting of hyperthyroidism. TEE with EF 10% RV ok. Started on amiodarone and diuresed. Underwent TEE/DC-CV and felt much better. Also treated with methimazole and prednisone.   Seen again 04/2019.Weightwasup a few pounds. Zio patch placed to monitor PVC burden   Zio patch 8/20 - NSR avg HR 75 - 13 runs NSVT longest 14 beats - 59 runs SVT longest 9.4 seconds - PVC burden 4.8%  Admitted10/14-10/17/20 to [place] for COVID 19 infection c/b gastroenteritis and recurrent afib w/ RVR. Was loaded on IV amiodaroneand IV digoxin and rate improved but did not convert back to NSR. Was placed on Eliquis and discharged home on PO digoxin and metoprolol. Was advised to f/u in [facility] for further management.   Was seen in AHF clinic 07/03/19 for f/u with rapid AF in the 150s. She was volume overloaded. She was directly admitted to [facility] for further management  Was placed  on IV amiodarone for afib. Eliquis resumed. TSH low normal, 0.354. Free T3 WNL 3.7. Free elevated T4 1.35. Methimazole and prednisone continued for hyperthyroidism.  EP was consulted. Initial plans were for PVI ablation but procedure canceled after TEE showed severely reduced EF at 5% and severe LAE. Pt underwent DCCV instead. Converted to sinus tach. After IV amiodarone load, was switched to oral. She improved post conversion as well as w/ diuresis. Was switched from IV to PO Lasix. Co-ox was monitored and remained stable, 60-65%. No IV inotrope requirements. She was also treated w/ Losartan (BP too soft for Entesto), spironolactone and digoxin. Overall not felt to be a great candidate for advanced therapies, pharmacist did thorough home med review and she appears to be quite noncompliant. Discharged 07/10/19. Per Dr. Rayann Heman, if she returns to AF with RVR over the short term,  would advise AV nodal ablation with CRT. If she remains in sinus rhythm with amiodarone and her EF recovers, could consider elective afib ablation eventually (a few months from now).   She was seen last week in [facility]. Weight up 3 pounds. Digoxin and spiro restarted. Amio decreased to 200 daily. Says she has good days and bad days. On bad days says she has no energy very weak and tired. On good days she can do ADLs without problem. No palpitations. Has a tremor. Weight stable at 138. Appetite not very good. Has lots of pain in her back and kidney area.   Cardiac Studies   Echo 03/2017 EF 10-15% Echo  07/2017 EF 25-30%  Echo 11/2017 EF 25-30% Echo 06/2018: EF ~30% (per Dr Haroldine Laws) Echo 8/20 EF 25-30% TEE 11/20 EF 5-10%  R/LHC 8/ 018 LHC - normal cors  Right Heart  Right Heart Pressures RA (mean): 5 mmHg RV (S/EDP): 52/4 mmHg PA (S/D, mean): 52/20 (31) mmHg PCWP (mean): 30 mmHg  Ao sat: 96% PA sat: 71% RA sat: 74%  Fick CO: 5.3 L/min Fick CI: 3.3 L/min/m^2  SVR: 1,101 dynscm-5      Past Medical  History:  Diagnosis Date  . Abnormal TSH   . Anemia   . Arthritis    "knees, hands" (04/15/2017)  . Breast cancer ([place])   . Chronic knee pain   . Chronic systolic CHF (congestive heart failure) ([place])   . COVID-19   . Does not have health insurance   . Frequent PVCs   . Hyperthyroidism 03/05/2019  . Hyperthyroidism 03/05/2019  . 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 NICM (nonischemic cardiomyopathy) ([place])    a. ? PVC cardiomyopathy. right and left heart catheterization on 04/15/17, no CAD. Fick output/index 5.28/3.34.  . Noncompliance    a. episodic noncompliance.  . Pneumonia 06/2016    Current Outpatient Medications  Medication Sig Dispense Refill  . acetaminophen (TYLENOL) 650 MG CR tablet Take 650 mg by mouth every 8 (eight) hours as needed for pain.    Marland Kitchen amiodarone (PACERONE) 200 MG tablet Take 1 tablet (200 mg total) by mouth 2 (two) times daily. 60 tablet 3  . anastrozole (ARIMIDEX) 1 MG tablet Take 1 mg by mouth daily.    Marland Kitchen apixaban (ELIQUIS) 5 MG TABS tablet Take 1 tablet (5 mg total) by mouth 2 (two) times daily. 60 tablet 11  . Ascorbic Acid (VITAMIN C) 1000 MG tablet Take 1,000 mg by mouth daily.    . Cholecalciferol (D3-1000) 25 MCG (1000 UT) tablet Take 1,000 Units by mouth daily.    . Cyanocobalamin (VITAMIN B 12) 500 MCG TABS Take 1,000 mcg by mouth daily.     . digoxin (LANOXIN) 0.125 MG tablet Take 1 tablet (0.125 mg total) by mouth daily. 30 tablet 11  . furosemide (LASIX) 40 MG tablet Take 1 tablet (40 mg total) by mouth daily. 30 tablet 3  . losartan (COZAAR) 25 MG tablet Take 0.5 tablets (12.5 mg total) by mouth at bedtime. 30 tablet 3  . spironolactone (ALDACTONE) 25 MG tablet Take 0.5 tablets (12.5 mg total) by mouth daily. 30 tablet 3   No current facility-administered medications for this encounter.     No Known Allergies    Social History   Socioeconomic History  . Marital status: Single    Spouse name: Not on file  . Number of  children: Not on file  . Years of education: Not on file  . Highest education level: Not on file  Occupational History  . Not on file  Social Needs  . Financial resource strain: Not on file  . Food insecurity    Worry: Patient refused    Inability: Patient refused  . Transportation needs    Medical: Patient refused    Non-medical: Patient refused  Tobacco Use  . Smoking status: Never Smoker  . Smokeless tobacco: Never Used  Substance and Sexual Activity  . Alcohol use: Yes    Comment: 04/15/2017 "might have a couple beers q couple months"  . Drug use: No  . Sexual activity: Not Currently  Lifestyle  . Physical activity    Days per  week: Patient refused    Minutes per session: Patient refused  . Stress: Only a little  Relationships  . Social Herbalist on phone: Patient refused    Gets together: Patient refused    Attends religious service: Patient refused    Active member of club or organization: Patient refused    Attends meetings of clubs or organizations: Patient refused    Relationship status: Patient refused  . Intimate partner violence    Fear of current or ex partner: Patient refused    Emotionally abused: Patient refused    Physically abused: Patient refused    Forced sexual activity: Patient refused  Other Topics Concern  . Not on file  Social History Narrative   N/A      Family History  Problem Relation Age of Onset  . Hypertension Mother     Vitals:   07/27/19 1356  BP: 110/62  Pulse: (!) 101  SpO2: 98%  Weight: 62.6 kg (138 lb)     PHYSICAL EXAM: General:  Anxious appearing. No resp difficulty HEENT: normal Neck: supple. JVP 5 Carotids 2+ bilat; no bruits. No lymphadenopathy or thryomegaly appreciated. Cor: PMI laterally displaced. Tachy regular + s3 Lungs: clear Abdomen: obese soft, nontender, nondistended. No hepatosplenomegaly. No bruits or masses. Good bowel sounds. Extremities: no cyanosis, clubbing, rash, edema Neuro:  alert & orientedx3, cranial nerves grossly intact. moves all 4 extremities w/o difficulty. Affect pleasant + tremor  ECG: sinus tach 101 bpm Personally reviewed    ASSESSMENT & PLAN:  1. Atrial Fibrillation:  - s/p previous DC-CV in 7/20. 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  Previously was on amio for PVCs but developed hyperthyroidism, now being treated w/ methimazole. Admitted 06/2019 for persistent afib w/ RVR + a/c systolic CHF. Loaded w/ IV amiodarone and evaluated by EP. Initial plans were for PVI ablation but procedure canceled after TEE showed severely reduced EF at 5% and severe LAE. Pt underwent DCCV instead. Converted to sinus tach. Per Dr. Rayann Heman, if she returns to AF with RVR over the short term,  would advise AV nodal ablation with CRT. If she remains in sinus rhythm with amiodarone and her EF recovers, could consider elective afib ablation eventually (a few months from now).  - Remains in Sinus Rhythm/tach - Continue digoxin at 0.125 daily - Avoiding  blockers given severely reduced EF - Continue amiodarone. Wean down to 200 mg daily due to tremors. -  Keep f/u w/ AFib clinic 12/14  - Continue Eliquis 5 mg bid  2.Acute onChronic systolic [id number], normal cors in 03/2017. 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 03/2017 EF 15-20% Echo 07/28/17 EF 25-30%  - Echo 11/2017: EF 25-30%.  - Echo 06/2018 EF ~30% - Echo 7/20 in setting of PAF with RVR. EF 10% - Echo 8/20 EF 25-30% - TEE 11/20 EF 5% - Suspecther CM is likely tachy-mediated (vs PVC).  Hopefully she will have some functional improvement in NSR.   - Volume status stable. NYHA III - Continue Lasix 40 mg daily  - Increase Losartan to 25 mg daily (BP too soft for Entresto) - Continue spironolactone 12.5 daily.  - Continue digoxin 0.125 mg  - No b-blocker yet with possible low output - Labs today - Not great candidate for advanced therapies, pharmacist did thorough home med review today and she appears to be quite  noncompliant.  - Hopefully EF will improve with maintenance of NSR  3. Hyperthyroidism - Recent TFTs 11/20: TSH low normal, 0.354. Free T3 WNL  3.7. Free elevated T4 1.35 - Continue methimazole  - Continue amio despite amio-induced hyperthyroidism as it is our only option.   4.H/oFrequent PVCs - Zio patch8/20with 5% PVC burden  - Amio as above  5. Left Breast Cancer. -treated w/XRT  6. Medication noncompliance -language barrier plays a role    Glori Bickers, MD 07/27/19

## 2019-07-27 NOTE — Patient Instructions (Signed)
DECREASE Amirodarone to 200 mg, one tab daily INCREASE Losartan to 25 mg, one tab daily  Labs today We will only contact you if something comes back abnormal or we need to make some changes. Otherwise no news is good news!  Your physician recommends that you schedule a follow-up appointment in: 3-4 weeks  Do the following things EVERYDAY: 1) Weigh yourself in the morning before breakfast. Write it down and keep it in a log. 2) Take your medicines as prescribed 3) Eat low salt foods-Limit salt (sodium) to 2000 mg per day.  4) Stay as active as you can everyday 5) Limit all fluids for the day to less than 2 liters  At the [facility], you and your health needs are our priority. As part of our continuing mission to provide you with exceptional heart care, we have created designated Provider Care Teams. These Care Teams include your primary Cardiologist (physician) and Advanced Practice Providers (APPs- Physician Assistants and Nurse Practitioners) who all work together to provide you with the care you need, when you need it.   You may see any of the following providers on your designated Care Team at your next follow up: Marland Kitchen Dr Glori Bickers . Dr Loralie Champagne . Darrick Grinder, NP . Lyda Jester, PA . Audry Riles, PharmD   Please be sure to bring in all your medications bottles to every appointment.

## 2019-07-31 ENCOUNTER — Other Ambulatory Visit: Payer: Self-pay

## 2019-07-31 ENCOUNTER — Ambulatory Visit (INDEPENDENT_AMBULATORY_CARE_PROVIDER_SITE_OTHER): Payer: Self-pay | Admitting: Primary Care

## 2019-07-31 ENCOUNTER — Encounter (INDEPENDENT_AMBULATORY_CARE_PROVIDER_SITE_OTHER): Payer: Self-pay | Admitting: Primary Care

## 2019-07-31 DIAGNOSIS — I5022 Chronic systolic (congestive) heart failure: Secondary | ICD-10-CM

## 2019-07-31 DIAGNOSIS — G4700 Insomnia, unspecified: Secondary | ICD-10-CM

## 2019-07-31 DIAGNOSIS — Z09 Encounter for follow-up examination after completed treatment for conditions other than malignant neoplasm: Secondary | ICD-10-CM

## 2019-07-31 MED ORDER — MELATONIN 5 MG PO CAPS: 5.0000 mg | ORAL_CAPSULE | Freq: Every evening | ORAL | 3 refills | Status: DC | PRN

## 2019-07-31 NOTE — Progress Notes (Signed)
Virtual Visit via Telephone Note  I connected with Megan Keith on 07/31/19 at  2:30 PM EST by telephone and verified that I am speaking with the correct person using two identifiers.   I discussed the limitations, risks, security and privacy concerns of performing an evaluation and management service by telephone and the availability of in person appointments. I also discussed with the patient that there may be a patient responsible charge related to this service. The patient expressed understanding and agreed to proceed.   History of Present Illness: [name] is a hospital follow up- Atrial fibrillation and chronic systolic CHF she  is followed by Dr. Carrolyn Keith. Today she does not have any complaints or concern.    Past Medical History:  Diagnosis Date  . Abnormal TSH   . Anemia   . Arthritis    "knees, hands" (04/15/2017)  . Breast cancer ([place])   . Chronic knee pain   . Chronic systolic CHF (congestive heart failure) ([place])   . COVID-19   . Does not have health insurance   . Frequent PVCs   . Hyperthyroidism 03/05/2019  . Hyperthyroidism 03/05/2019  . 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 NICM (nonischemic cardiomyopathy) ([place])    a. ? PVC cardiomyopathy. right and left heart catheterization on 04/15/17, no CAD. Fick output/index 5.28/3.34.  . Noncompliance    a. episodic noncompliance.  . Pneumonia 06/2016     Current Outpatient Medications on File Prior to Visit  Medication Sig Dispense Refill  . acetaminophen (TYLENOL) 650 MG CR tablet Take 650 mg by mouth every 8 (eight) hours as needed for pain.    Marland Kitchen amiodarone (PACERONE) 200 MG tablet Take 1 tablet (200 mg total) by mouth daily. 30 tablet 3  . anastrozole (ARIMIDEX) 1 MG tablet Take 1 mg by mouth daily.    Marland Kitchen apixaban (ELIQUIS) 5 MG TABS tablet Take 1 tablet (5 mg total) by mouth 2 (two) times daily. 60 tablet 11  . Ascorbic Acid (VITAMIN C) 1000 MG tablet Take 1,000 mg by  mouth daily.    . Cholecalciferol (D3-1000) 25 MCG (1000 UT) tablet Take 1,000 Units by mouth daily.    . Cyanocobalamin (VITAMIN B 12) 500 MCG TABS Take 1,000 mcg by mouth daily.     . digoxin (LANOXIN) 0.125 MG tablet Take 1 tablet (0.125 mg total) by mouth daily. 30 tablet 11  . furosemide (LASIX) 40 MG tablet Take 1 tablet (40 mg total) by mouth daily. 30 tablet 3  . losartan (COZAAR) 25 MG tablet Take 1 tablet (25 mg total) by mouth at bedtime. 30 tablet 3  . spironolactone (ALDACTONE) 25 MG tablet Take 0.5 tablets (12.5 mg total) by mouth daily. 30 tablet 3   No current facility-administered medications on file prior to visit.    Observations/Objective: Review of Systems  Psychiatric/Behavioral: The patient has insomnia.   All other systems reviewed and are negative.   Assessment and Plan: [name] was seen today for hospitalization follow-up.  Diagnoses and all orders for this visit:  Hospital discharge follow-up Admitted for CHF and Afib followed by  Bensimhon, Shaune Pascal, MD review of notes indicate noncompliance also in the setting of language barrier. October 2020 she was hospitalized for COVID and developed recurrent atrial fib. Previously was on amio for PVCs but developed hyperthyroidism treated methimazole.   Chronic systolic CHF (congestive heart failure) ([place])  Admitted to hospital on 06/2019 for persistent afib w/ RVR and acute/chronic systolic CHF.-  Continue Eliquis 5 mg bid followed by Jolaine Artist, MD   Insomnia, unspecified type Can try melatonin 5mg -15 mg at night for sleep, can also do benadryl 25-50mg  at night for sleep . Some wakefulness can be related to a certain stress or worry. Sleep deprivation itself can make the problem worse. Every little thing feels more severe because you are overtired and your ability to cope is decreased.  Other orders -     Melatonin 5 MG CAPS; Take 1 capsule (5 mg total) by mouth at bedtime as needed.    Follow Up  Instructions:    I discussed the assessment and treatment plan with the patient. The patient was provided an opportunity to ask questions and all were answered. The patient agreed with the plan and demonstrated an understanding of the instructions.   The patient was advised to call back or seek an in-person evaluation if the symptoms worsen or if the condition fails to improve as anticipated.  I provided 18 minutes of non-face-to-face time during this encounter. Reviewed medical records, labs     Kerin Perna, NP

## 2019-07-31 NOTE — Progress Notes (Signed)
Back pain, kidney pain, and lung pain from tubing in hospital

## 2019-08-01 ENCOUNTER — Telehealth (HOSPITAL_COMMUNITY): Payer: Self-pay | Admitting: Cardiology

## 2019-08-01 NOTE — Telephone Encounter (Signed)
Patients daughter called to report patients complaints. Patients states she has been experiencing increased SOB, dizziness and lightheadedness,, L breast pain.  Denies recent medication changes, weight stable However at the time of the call patient reports active CP Advised patient should report to ER for further evaluation  Patient/daughter voiced understanding

## 2019-08-06 ENCOUNTER — Ambulatory Visit (HOSPITAL_COMMUNITY)
Admission: RE | Admit: 2019-08-06 | Discharge: 2019-08-06 | Disposition: A | Discharge status: Home / Self Care | Payer: Self-pay | Source: Ambulatory Visit | Attending: Physician Assistant | Admitting: Physician Assistant

## 2019-08-06 ENCOUNTER — Other Ambulatory Visit: Payer: Self-pay

## 2019-08-06 ENCOUNTER — Encounter (HOSPITAL_COMMUNITY): Payer: Self-pay | Admitting: Physician Assistant

## 2019-08-06 VITALS — BP 112/66 | HR 97 | Ht <= 58 in | Wt 141.0 lb

## 2019-08-06 DIAGNOSIS — Z8619 Personal history of other infectious and parasitic diseases: Secondary | ICD-10-CM | POA: Insufficient documentation

## 2019-08-06 DIAGNOSIS — Z853 Personal history of malignant neoplasm of breast: Secondary | ICD-10-CM | POA: Insufficient documentation

## 2019-08-06 DIAGNOSIS — I428 Other cardiomyopathies: Secondary | ICD-10-CM | POA: Insufficient documentation

## 2019-08-06 DIAGNOSIS — I5022 Chronic systolic (congestive) heart failure: Secondary | ICD-10-CM | POA: Insufficient documentation

## 2019-08-06 DIAGNOSIS — Z6831 Body mass index (BMI) 31.0-31.9, adult: Secondary | ICD-10-CM | POA: Insufficient documentation

## 2019-08-06 DIAGNOSIS — Z7901 Long term (current) use of anticoagulants: Secondary | ICD-10-CM | POA: Insufficient documentation

## 2019-08-06 DIAGNOSIS — E0590 Thyrotoxicosis, unspecified without thyrotoxic crisis or storm: Secondary | ICD-10-CM | POA: Insufficient documentation

## 2019-08-06 DIAGNOSIS — Z8249 Family history of ischemic heart disease and other diseases of the circulatory system: Secondary | ICD-10-CM | POA: Insufficient documentation

## 2019-08-06 DIAGNOSIS — I4819 Other persistent atrial fibrillation: Secondary | ICD-10-CM | POA: Insufficient documentation

## 2019-08-06 DIAGNOSIS — Z79899 Other long term (current) drug therapy: Secondary | ICD-10-CM | POA: Insufficient documentation

## 2019-08-06 DIAGNOSIS — I480 Paroxysmal atrial fibrillation: Secondary | ICD-10-CM | POA: Insufficient documentation

## 2019-08-06 DIAGNOSIS — E669 Obesity, unspecified: Secondary | ICD-10-CM | POA: Insufficient documentation

## 2019-08-06 NOTE — Progress Notes (Signed)
Primary Care Physician: Kerin Perna, NP Primary Cardiologist: Dr Haroldine Laws Primary Electrophysiologist: Dr Rayann Heman Referring Physician: Dr Toya Smothers Megan Keith is a 58 y.o. female with a history of NICM felt 2/2 PVCs,  chronic CHF (systolic), hyperthyroidism, and persistent atrial fibrillation who presents for follow up in the [facility]. S/p previous DC-CV in 7/20. 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  Previously was on amio for PVCs but developed hyperthyroidism, now being treated w/ methimazole. Admitted 06/2019 for persistent afib w/ RVR with acute on chronic systolic CHF. Loaded w/ IV amiodarone and evaluated by Dr Rayann Heman. Initial plans were for PVI ablation but procedure canceled after TEE showed severely reduced EF at 5% and severe LAE. Pt underwent DCCV instead.Converted to sinus tach. She is on Eliquis for a CHADS2VASC score of 2.   On follow up today, patient reports that she is doing well. An interpreter was used today. She reports that she is feeling stronger and stronger since leaving the hospital. She states that she has more energy and less SOB. She denies heart racing or palpitations. She is tolerating the medication without difficulty.  Today, she denies symptoms of palpitations, chest pain, shortness of breath, orthopnea, PND, lower extremity edema, dizziness, presyncope, syncope, snoring, daytime somnolence, bleeding, or neurologic sequela. The patient is tolerating medications without difficulties and is otherwise without complaint today.    Atrial Fibrillation Risk Factors:  she does not have symptoms or diagnosis of sleep apnea. she does not have a history of rheumatic fever.   she has a BMI of Body mass index is 31.61 kg/m.Marland Kitchen Filed Weights   08/06/19 1056  Weight: 64 kg    Family History  Problem Relation Age of Onset  . Hypertension Mother      Atrial Fibrillation Management history:  Previous  antiarrhythmic drugs: amiodarone Previous cardioversions: 02/2019, 07/05/2019 Previous ablations: none CHADS2VASC score: 2 Anticoagulation history: Eliquis   Past Medical History:  Diagnosis Date  . Abnormal TSH   . Anemia   . Arthritis    "knees, hands" (04/15/2017)  . Breast cancer ([place])   . Chronic knee pain   . Chronic systolic CHF (congestive heart failure) ([place])   . COVID-19   . Does not have health insurance   . Frequent PVCs   . Hyperthyroidism 03/05/2019  . Hyperthyroidism 03/05/2019  . 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 ([place]) 04/15/2017  . NICM (nonischemic cardiomyopathy) ([place])    a. ? PVC cardiomyopathy. right and left heart catheterization on 04/15/17, no CAD. Fick output/index 5.28/3.34.  . Noncompliance    a. episodic noncompliance.  . Pneumonia 06/2016   Past Surgical History:  Procedure Laterality Date  . BREAST CYST EXCISION Left   . CARDIOVERSION N/A 03/09/2019   Procedure: CARDIOVERSION;  Surgeon: Jolaine Artist, MD;  Location: [facility] ENDOSCOPY;  Service: Cardiovascular;  Laterality: N/A;  . CARDIOVERSION N/A 07/05/2019   Procedure: CARDIOVERSION;  Surgeon: Thompson Grayer, MD;  Location: [place] CV LAB;  Service: Cardiovascular;  Laterality: N/A;  . CORONARY/GRAFT ANGIOGRAPHY N/A 04/15/2017   Procedure: CORONARY/GRAFT ANGIOGRAPHY;  Surgeon: Nelva Bush, MD;  Location: [place] CV LAB;  Service: Cardiovascular;  Laterality: N/A;  . RIGHT HEART CATH N/A 04/15/2017   Procedure: RIGHT HEART CATH;  Surgeon: Nelva Bush, MD;  Location: [place] CV LAB;  Service: Cardiovascular;  Laterality: N/A;  . TEE WITHOUT CARDIOVERSION N/A 03/09/2019   Procedure: TRANSESOPHAGEAL ECHOCARDIOGRAM (TEE);  Surgeon: Jolaine Artist, MD;  Location: [facility] ENDOSCOPY;  Service: Cardiovascular;  Laterality: N/A;  . TEE WITHOUT CARDIOVERSION N/A 07/05/2019   Procedure: TRANSESOPHAGEAL ECHOCARDIOGRAM (TEE);  Surgeon: Thompson Grayer, MD;  Location: [place] CV LAB;   Service: Cardiovascular;  Laterality: N/A;  . TUBAL LIGATION      Current Outpatient Medications  Medication Sig Dispense Refill  . acetaminophen (TYLENOL) 650 MG CR tablet Take 650 mg by mouth every 8 (eight) hours as needed for pain.    Marland Kitchen amiodarone (PACERONE) 200 MG tablet Take 1 tablet (200 mg total) by mouth daily. 30 tablet 3  . anastrozole (ARIMIDEX) 1 MG tablet Take 1 mg by mouth daily.    Marland Kitchen apixaban (ELIQUIS) 5 MG TABS tablet Take 1 tablet (5 mg total) by mouth 2 (two) times daily. 60 tablet 11  . Ascorbic Acid (VITAMIN C) 1000 MG tablet Take 1,000 mg by mouth daily.    . calcium-vitamin D (OSCAL WITH D) 500-200 MG-UNIT TABS tablet Take by mouth.    . Cholecalciferol (D3-1000) 25 MCG (1000 UT) tablet Take 1,000 Units by mouth daily.    . Cyanocobalamin (VITAMIN B 12) 500 MCG TABS Take 1,000 mcg by mouth daily.     . digoxin (LANOXIN) 0.125 MG tablet Take 1 tablet (0.125 mg total) by mouth daily. 30 tablet 11  . furosemide (LASIX) 40 MG tablet Take 1 tablet (40 mg total) by mouth daily. 30 tablet 3  . losartan (COZAAR) 25 MG tablet Take 1 tablet (25 mg total) by mouth at bedtime. 30 tablet 3  . Melatonin 5 MG CAPS Take 1 capsule (5 mg total) by mouth at bedtime as needed. 30 capsule 3  . spironolactone (ALDACTONE) 25 MG tablet Take 0.5 tablets (12.5 mg total) by mouth daily. 30 tablet 3   No current facility-administered medications for this encounter.    No Known Allergies  Social History   Socioeconomic History  . Marital status: Single    Spouse name: Not on file  . Number of children: Not on file  . Years of education: Not on file  . Highest education level: Not on file  Occupational History  . Not on file  Tobacco Use  . Smoking status: Never Smoker  . Smokeless tobacco: Never Used  Substance and Sexual Activity  . Alcohol use: Not Currently    Comment: 04/15/2017 "might have a couple beers q couple months"  . Drug use: No  . Sexual activity: Not Currently    Other Topics Concern  . Not on file  Social History Narrative   N/A   Social Determinants of Health   Financial Resource Strain:   . Difficulty of Paying Living Expenses: Not on file  Food Insecurity: Unknown  . Worried About Charity fundraiser in the Last Year: Patient refused  . Ran Out of Food in the Last Year: Patient refused  Transportation Needs: Unknown  . Lack of Transportation (Medical): Patient refused  . Lack of Transportation (Non-Medical): Patient refused  Physical Activity: Unknown  . Days of Exercise per Week: Patient refused  . Minutes of Exercise per Session: Patient refused  Stress: No Stress Concern Present  . Feeling of Stress : Only a little  Social Connections: Unknown  . Frequency of Communication with Friends and Family: Patient refused  . Frequency of Social Gatherings with Friends and Family: Patient refused  . Attends Religious Services: Patient refused  . Active Member of Clubs or Organizations: Patient refused  . Attends Archivist Meetings: Patient refused  . Marital Status:  Patient refused  Intimate Partner Violence: Unknown  . Fear of Current or Ex-Partner: Patient refused  . Emotionally Abused: Patient refused  . Physically Abused: Patient refused  . Sexually Abused: Patient refused     ROS- All systems are reviewed and negative except as per the HPI above.  Physical Exam: Vitals:   08/06/19 1056  BP: 112/66  Pulse: 97  Weight: 64 kg  Height: 4\' 8"  (1.422 m)    GEN- The patient is well appearing obese female, alert and oriented x 3 today.   Head- normocephalic, atraumatic Eyes-  Sclera clear, conjunctiva pink Ears- hearing intact Oropharynx- clear Neck- supple  Lungs- Clear to ausculation bilaterally, normal work of breathing Heart- Regular rate and rhythm, no murmurs, rubs or gallops  GI- soft, NT, ND, + BS Extremities- no clubbing, cyanosis, or edema MS- no significant deformity or atrophy Skin- no rash or  lesion Psych- euthymic mood, full affect Neuro- strength and sensation are intact  Wt Readings from Last 3 Encounters:  08/06/19 64 kg  07/27/19 62.6 kg  07/18/19 63 kg    EKG today demonstrates SR HR 97, NST, PR 188, QRS 90, QTc 480  TEE 07/05/19 demonstrated  1. Left ventricular ejection fraction, by visual estimation, is 5-10%. The left ventricle has severely decreased function. There is no left ventricular hypertrophy.  2. Global right ventricle has moderately reduced systolic function.The right ventricular size is mildly enlarged. No increase in right ventricular wall thickness.  3. Left atrial size was severely dilated.  4. Mo LAA clot.  5. Right atrial size was mildly dilated.  6. The mitral valve is normal in structure. Moderate mitral valve regurgitation.  7. The tricuspid valve is normal in structure. Tricuspid valve regurgitation moderate.  8. The aortic valve is normal in structure. Aortic valve regurgitation is not visualized.  9. The pulmonic valve was grossly normal. Pulmonic valve regurgitation is not visualized.  Epic records are reviewed at length today  Assessment and Plan:  1. Persistent atrial fibrillation S/p TEE/DCCV on 07/05/19. Patient appears to be maintaining SR. Continue Eliquis 5 mg BID Continue amiodarone 200 mg daily Continue digoxin 0.125 mg daily. Per Dr Rayann Heman, if she returns to afib with RVR would consider AV node ablation with CRT. If her EF recovers with SR, would consider afib ablation.  This patients CHA2DS2-VASc Score and unadjusted Ischemic Stroke Rate (% per year) is equal to 2.2 % stroke rate/year from a score of 2  Above score calculated as 1 point each if present [CHF, HTN, DM, Vascular=MI/PAD/Aortic Plaque, Age if 65-74, or Female] Above score calculated as 2 points each if present [Age > 75, or Stroke/TIA/TE]   2. Obesity Body mass index is 31.61 kg/m. Lifestyle modification was discussed at length including regular exercise  and weight reduction.  3. Chronic systolic CHF No signs or symptoms of fluid overload today. Weight stable. Hopefully this will improve with SR. Clinical improvement.  Followed by Dr Haroldine Laws.    Follow up with Dr Rayann Heman and AHF clinic as scheduled.    [facility] [address] [place], [place] [address] [phone] 08/06/2019 11:20 AM

## 2019-08-27 ENCOUNTER — Other Ambulatory Visit: Payer: Self-pay

## 2019-08-27 ENCOUNTER — Encounter (HOSPITAL_COMMUNITY): Payer: Self-pay

## 2019-08-27 ENCOUNTER — Ambulatory Visit (HOSPITAL_COMMUNITY)
Admission: RE | Admit: 2019-08-27 | Discharge: 2019-08-27 | Disposition: A | Discharge status: Home / Self Care | Payer: Self-pay | Source: Ambulatory Visit | Attending: Cardiology | Admitting: Cardiology

## 2019-08-27 VITALS — BP 121/83 | HR 85 | Wt 140.0 lb

## 2019-08-27 DIAGNOSIS — C50912 Malignant neoplasm of unspecified site of left female breast: Secondary | ICD-10-CM | POA: Insufficient documentation

## 2019-08-27 DIAGNOSIS — I471 Supraventricular tachycardia: Secondary | ICD-10-CM | POA: Insufficient documentation

## 2019-08-27 DIAGNOSIS — I428 Other cardiomyopathies: Secondary | ICD-10-CM | POA: Insufficient documentation

## 2019-08-27 DIAGNOSIS — Z9119 Patient's noncompliance with other medical treatment and regimen: Secondary | ICD-10-CM | POA: Insufficient documentation

## 2019-08-27 DIAGNOSIS — E0590 Thyrotoxicosis, unspecified without thyrotoxic crisis or storm: Secondary | ICD-10-CM | POA: Insufficient documentation

## 2019-08-27 DIAGNOSIS — I493 Ventricular premature depolarization: Secondary | ICD-10-CM | POA: Insufficient documentation

## 2019-08-27 DIAGNOSIS — D649 Anemia, unspecified: Secondary | ICD-10-CM | POA: Insufficient documentation

## 2019-08-27 DIAGNOSIS — M1990 Unspecified osteoarthritis, unspecified site: Secondary | ICD-10-CM | POA: Insufficient documentation

## 2019-08-27 DIAGNOSIS — I480 Paroxysmal atrial fibrillation: Secondary | ICD-10-CM

## 2019-08-27 DIAGNOSIS — R42 Dizziness and giddiness: Secondary | ICD-10-CM | POA: Insufficient documentation

## 2019-08-27 DIAGNOSIS — I429 Cardiomyopathy, unspecified: Secondary | ICD-10-CM | POA: Insufficient documentation

## 2019-08-27 DIAGNOSIS — I4819 Other persistent atrial fibrillation: Secondary | ICD-10-CM | POA: Insufficient documentation

## 2019-08-27 DIAGNOSIS — R410 Disorientation, unspecified: Secondary | ICD-10-CM | POA: Insufficient documentation

## 2019-08-27 DIAGNOSIS — Z8249 Family history of ischemic heart disease and other diseases of the circulatory system: Secondary | ICD-10-CM | POA: Insufficient documentation

## 2019-08-27 DIAGNOSIS — Z7901 Long term (current) use of anticoagulants: Secondary | ICD-10-CM | POA: Insufficient documentation

## 2019-08-27 DIAGNOSIS — Z853 Personal history of malignant neoplasm of breast: Secondary | ICD-10-CM | POA: Insufficient documentation

## 2019-08-27 DIAGNOSIS — Z79899 Other long term (current) drug therapy: Secondary | ICD-10-CM | POA: Insufficient documentation

## 2019-08-27 DIAGNOSIS — I5022 Chronic systolic (congestive) heart failure: Secondary | ICD-10-CM | POA: Insufficient documentation

## 2019-08-27 DIAGNOSIS — Z9114 Patient's other noncompliance with medication regimen: Secondary | ICD-10-CM | POA: Insufficient documentation

## 2019-08-27 LAB — COMPREHENSIVE METABOLIC PANEL
ALT: 35 U/L (ref 0–44)
AST: 37 U/L (ref 15–41)
Albumin: 3.9 g/dL (ref 3.5–5.0)
Alkaline Phosphatase: 102 U/L (ref 38–126)
Anion gap: 10 (ref 5–15)
BUN: 15 mg/dL (ref 6–20)
CO2: 22 mmol/L (ref 22–32)
Calcium: 9.1 mg/dL (ref 8.9–10.3)
Chloride: 105 mmol/L (ref 98–111)
Creatinine, Ser: 0.67 mg/dL (ref 0.44–1.00)
GFR calc Af Amer: >60 mL/min (ref >60)
GFR calc non Af Amer: >60 mL/min (ref >60)
Glucose, Bld: 97 mg/dL (ref 70–99)
Potassium: 3.8 mmol/L (ref 3.5–5.1)
Sodium: 137 mmol/L (ref 135–145)
Total Bilirubin: 0.4 mg/dL (ref 0.3–1.2)
Total Protein: 6.8 g/dL (ref 6.5–8.1)

## 2019-08-27 LAB — CBC
HCT: 44 % (ref 36.0–46.0)
Hemoglobin: 14.1 g/dL (ref 12.0–15.0)
MCH: 29.4 pg (ref 26.0–34.0)
MCHC: 32 g/dL (ref 30.0–36.0)
MCV: 91.9 fL (ref 80.0–100.0)
Platelets: 327 10*3/uL (ref 150–400)
RBC: 4.79 MIL/uL (ref 3.87–5.11)
RDW: 17.2 % — ABNORMAL HIGH (ref 11.5–15.5)
WBC: 8 10*3/uL (ref 4.0–10.5)
nRBC: 0 % (ref 0.0–0.2)

## 2019-08-27 LAB — T4, FREE: Free T4: 1.75 ng/dL — ABNORMAL HIGH (ref 0.61–1.12)

## 2019-08-27 LAB — DIGOXIN LEVEL: Digoxin Level: 1.2 ng/mL (ref 0.8–2.0)

## 2019-08-27 LAB — TSH: TSH: 0.259 u[IU]/mL — ABNORMAL LOW (ref 0.350–4.500)

## 2019-08-27 NOTE — Patient Instructions (Signed)
Lab work done today. We will notify you of any abnormal lab work. No news is good news!  EKG done today.  INCREASE Losartan to 25mg  (1 whole tab) daily at bedtime.  Your physician has requested that you have an echocardiogram. Echocardiography is a painless test that uses sound waves to create images of your heart. It provides your doctor with information about the size and shape of your heart and how well your heart's chambers and valves are working. This procedure takes approximately one hour. There are no restrictions for this procedure.  Please follow up with the [facility] in 6 weeks.  At the [facility], you and your health needs are our priority. As part of our continuing mission to provide you with exceptional heart care, we have created designated Provider Care Teams. These Care Teams include your primary Cardiologist (physician) and Advanced Practice Providers (APPs- Physician Assistants and Nurse Practitioners) who all work together to provide you with the care you need, when you need it.   You may see any of the following providers on your designated Care Team at your next follow up: Marland Kitchen Dr Glori Bickers . Dr Loralie Champagne . Darrick Grinder, NP . Lyda Jester, PA . Audry Riles, PharmD   Please be sure to bring in all your medications bottles to every appointment.

## 2019-08-27 NOTE — Progress Notes (Addendum)
Advanced Heart Failure Clinic Note   Referring Physician: PCP: Kerin Perna, NP PCP-Cardiologist: Glori Bickers, MD  EP: Dr. 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   She presented to [facility] on 04/13/17 with neck pain, SOB and orthopnea. Last EF prior to that reported by Dr. [name] was 35%. Repeat echo showed reduced EF of 15%, she was transferred to [facility] for further evaluation. Underwent right and left heart catheterization on 04/15/17, no CAD. Fick output/index 5.28/3.34. Full results below.   Noted to have >20% PVC on telemetry. She was started on IV Amio and transitioned to po Amio. Also started on digoxin and spiro prior to discharge. BP was too soft to add any other medications. Discharge weight was 143 pounds.   Admitted 7/13-18/2020 with ADHF in setting of new-onset AF in the setting of hyperthyroidism. TEE with EF 10% RV ok. Started on amiodarone and diuresed. Underwent TEE/DC-CV and felt much better. Also treated with methimazole and prednisone.   Seen again 04/2019.Weightwasup a few pounds. Zio patch placed to monitor PVC burden   Zio patch 8/20 - NSR avg HR 75 - 13 runs NSVT longest 14 beats - 59 runs SVT longest 9.4 seconds - PVC burden 4.8%  Admitted10/14-10/17/20 to [place] for COVID 19 infection c/b gastroenteritis and recurrent afib w/ RVR. Was loaded on IV amiodaroneand IV digoxin and rate improved but did not convert back to NSR. Was placed on Eliquis and discharged home on PO digoxin and metoprolol. Was advised to f/u in [facility] for further management.   Was seen in AHF clinic 07/03/19 for f/u with rapid AF in the 150s. She was volume overloaded. She was directly admitted to [facility] for further management  Was placed  on IV amiodarone for afib. Eliquis resumed. TSH low normal, 0.354. Free T3 WNL 3.7. Free elevated T4 1.35. Methimazole and prednisone continued for hyperthyroidism.  EP was consulted. Initial plans were for PVI ablation but procedure canceled after TEE showed severely reduced EF at 5% and severe LAE. Pt underwent DCCV instead. Converted to sinus tach. After IV amiodarone load, was switched to oral. She improved post conversion as well as w/ diuresis. Was switched from IV to PO Lasix. Co-ox was monitored and remained stable, 60-65%. No IV inotrope requirements. She was also treated w/ Losartan (BP too soft for Entesto), spironolactone and digoxin. Overall not felt to be a great candidate for advanced therapies, pharmacist did thorough home med review and she appears to be quite noncompliant. Discharged 07/10/19. Per Dr. Rayann Heman, if she returns to AF with RVR over the short term,  would advise AV nodal ablation with CRT. If she remains in sinus rhythm with amiodarone and her EF recovers, could consider elective afib ablation eventually (a few months from now).   She had Afib Clinic f/u on 12/14. Was in NSR at visit and stable w/o significant symptoms. No changes were made. She was continued on amiodarone 200 mg daily + digoxin. She has EP clinic f/u with Dr. Rayann Heman on 2/18.   Back in AHF clinic today w/ interpreter and grandaughter. EKG shows NSR w/ PACs. HR 81 bpm. She denies exertional dyspnea. No LEE. No palpitations. There has been some confusion w/ meds. She has 2 pill bottles for 200 mg of  amiodarone and has been taking meds out of both bottles (taking total of 400 mg daily, instead of instructed 200 mg daily). We consolidated pills into one bottle and pt advised on how to take. Also only taking losartan 12.5 mg daily instead of recommended 25 mg daily. She takes losartan at night. BP today is 121/83. Occasionally feels dizzy and tired but no syncope/ near syncope.   Cardiac Studies   Echo 03/2017 EF  10-15% Echo 07/2017 EF 25-30%  Echo 11/2017 EF 25-30% Echo 06/2018: EF ~30% (per Dr Haroldine Laws) Echo 8/20 EF 25-30% TEE 11/20 EF 5-10%  R/LHC 8/ 018 LHC - normal cors  Right Heart  Right Heart Pressures RA (mean): 5 mmHg RV (S/EDP): 52/4 mmHg PA (S/D, mean): 52/20 (31) mmHg PCWP (mean): 30 mmHg  Ao sat: 96% PA sat: 71% RA sat: 74%  Fick CO: 5.3 L/min Fick CI: 3.3 L/min/m^2  SVR: 1,101 dynscm-5      Past Medical History:  Diagnosis Date  . Abnormal TSH   . Anemia   . Arthritis    "knees, hands" (04/15/2017)  . Breast cancer ([place])   . Chronic knee pain   . Chronic systolic CHF (congestive heart failure) ([place])   . COVID-19   . Does not have health insurance   . Frequent PVCs   . Hyperthyroidism 03/05/2019  . Hyperthyroidism 03/05/2019  . 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 ([place]) 04/15/2017  . NICM (nonischemic cardiomyopathy) ([place])    a. ? PVC cardiomyopathy. right and left heart catheterization on 04/15/17, no CAD. Fick output/index 5.28/3.34.  . Noncompliance    a. episodic noncompliance.  . Pneumonia 06/2016    Current Outpatient Medications  Medication Sig Dispense Refill  . acetaminophen (TYLENOL) 650 MG CR tablet Take 650 mg by mouth every 8 (eight) hours as needed for pain.    Marland Kitchen amiodarone (PACERONE) 200 MG tablet Take 1 tablet (200 mg total) by mouth daily. 30 tablet 3  . anastrozole (ARIMIDEX) 1 MG tablet Take 1 mg by mouth daily.    Marland Kitchen apixaban (ELIQUIS) 5 MG TABS tablet Take 1 tablet (5 mg total) by mouth 2 (two) times daily. 60 tablet 11  . Ascorbic Acid (VITAMIN C) 1000 MG tablet Take 1,000 mg by mouth daily.    . calcium-vitamin D (OSCAL WITH D) 500-200 MG-UNIT TABS tablet Take by mouth.    . Cholecalciferol (D3-1000) 25 MCG (1000 UT) tablet Take 1,000 Units by mouth daily.    . Cyanocobalamin (VITAMIN B 12) 500 MCG TABS Take 1,000 mcg by mouth daily.     . digoxin (LANOXIN) 0.125 MG tablet Take 1 tablet (0.125 mg total) by mouth daily. 30 tablet 11   . furosemide (LASIX) 40 MG tablet Take 1 tablet (40 mg total) by mouth daily. 30 tablet 3  . losartan (COZAAR) 25 MG tablet Take 1 tablet (25 mg total) by mouth at bedtime. 30 tablet 3  . Melatonin 5 MG CAPS Take 1 capsule (5 mg total) by mouth at bedtime as needed. 30 capsule 3  . spironolactone (ALDACTONE) 25 MG tablet Take 0.5 tablets (12.5 mg total) by mouth daily. 30 tablet 3   No current facility-administered medications for this encounter.    No Known Allergies    Social History   Socioeconomic History  . Marital status: Single    Spouse name: Not on file  . Number of children: Not on file  . Years of education: Not on file  . Highest education level: Not on file  Occupational History  . Not on file  Tobacco Use  . Smoking status: Never Smoker  . Smokeless tobacco: Never Used  Substance and Sexual Activity  . Alcohol use: Not Currently    Comment: 04/15/2017 "might have a couple beers q couple months"  . Drug use: No  . Sexual activity: Not Currently  Other Topics Concern  . Not on file  Social History Narrative   N/A   Social Determinants of Health   Financial Resource Strain:   . Difficulty of Paying Living Expenses: Not on file  Food Insecurity: Unknown  . Worried About Charity fundraiser in the Last Year: Patient refused  . Ran Out of Food in the Last Year: Patient refused  Transportation Needs: Unknown  . Lack of Transportation (Medical): Patient refused  . Lack of Transportation (Non-Medical): Patient refused  Physical Activity: Unknown  . Days of Exercise per Week: Patient refused  . Minutes of Exercise per Session: Patient refused  Stress: No Stress Concern Present  . Feeling of Stress : Only a little  Social Connections: Unknown  . Frequency of Communication with Friends and Family: Patient refused  . Frequency of Social Gatherings with Friends and Family: Patient refused  . Attends Religious Services: Patient refused  . Active Member of Clubs or  Organizations: Patient refused  . Attends Archivist Meetings: Patient refused  . Marital Status: Patient refused  Intimate Partner Violence: Unknown  . Fear of Current or Ex-Partner: Patient refused  . Emotionally Abused: Patient refused  . Physically Abused: Patient refused  . Sexually Abused: Patient refused      Family History  Problem Relation Age of Onset  . Hypertension Mother     Vitals:   08/27/19 1039  BP: 121/83  Pulse: 85  SpO2: 100%  Weight: 63.5 kg (140 lb)     PHYSICAL EXAM: General:  Well appearing, Hispanic female. No respiratory difficulty HEENT: normal Neck: supple. no JVD. Carotids 2+ bilat; no bruits. No lymphadenopathy or thyromegaly appreciated. Cor: PMI nondisplaced. Regular rate & rhythm. No rubs, gallops or murmurs. Lungs: clear Abdomen: soft, nontender, nondistended. No hepatosplenomegaly. No bruits or masses. Good bowel sounds. Extremities: no cyanosis, clubbing, rash, edema Neuro: alert & oriented x 3, cranial nerves grossly intact. moves all 4 extremities w/o difficulty. Affect pleasant.  ECG: NSR w/ PACs 81 bpm  Personally reviewed   ASSESSMENT & PLAN:  1. Atrial Fibrillation:  - s/p previous DC-CV in 7/20. 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  Previously was on amio for PVCs but developed hyperthyroidism, now being treated w/ methimazole. Admitted 06/2019 for persistent afib w/ RVR + a/c systolic CHF. Loaded w/ IV amiodarone and evaluated by EP. Initial plans were for PVI ablation but procedure canceled after TEE showed severely reduced EF at 5% and severe LAE. Pt underwent DCCV instead. Converted to sinus tach. Per Dr. Rayann Heman, if she returns to AF with RVR over the short term,  would advise AV nodal ablation with CRT. If she remains in sinus rhythm with amiodarone and her EF recovers, could consider elective afib ablation eventually (a few months from now).  - Remains in NSR by EKG. HR controlled in the 80s. No symptoms of  breakthrough Afib - Continue amiodarone. Will reduce down to 200 mg once daily (has been taking total of 400 mg daily see explanation in HPI above). Check CMP, TSH/Free T3/T4 today.  - Continue digoxin at 0.125 daily. Check digoxin level today - Avoiding  blockers given severely  reduced EF - Continue Eliquis 5 mg bid. Denies abnormal bleeding. No falls. Check CBC today.  - [facility] f/u w/ Dr. Rayann Heman 10/10/18   2.Chronic systolic [id number], normal cors in 03/2017. 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 03/2017 EF 15-20% Echo 07/28/17 EF 25-30%  - Echo 11/2017: EF 25-30%.  - Echo 06/2018 EF ~30% - Echo 7/20 in setting of PAF with RVR. EF 10% - Echo 8/20 EF 25-30% - TEE 11/20 EF 5% - Suspecther CM is likely tachy-mediated (vs PVC).  Hopefully she will have some functional improvement in NSR.   - Volume status stable. NYHA II - Continue Lasix 40 mg daily  - Increase Losartan to 25 mg qhs. Doubt w/ soft BP and occasional orthostasis, she will be able to tolerate Entresto) - Continue spironolactone 12.5 daily.  - Continue digoxin 0.125 mg. Check dig level today.  - No b-blocker yet with possible low output - BMP today - Not great candidate for advanced therapies, pharmacist did thorough home med review today and she appears to be quite noncompliant.  - Hopefully EF will improve with maintenance of NSR. Will plan repeat Echo in 4 weeks, prior to EP clinic f/u. ? ICD.   3. Hyperthyroidism - Recent TFTs 11/20: TSH low normal, 0.354. Free T3 WNL 3.7. Free elevated T4 1.35 - Continue methimazole  - Continue amio despite amio-induced hyperthyroidism as it is our only option. - recheck  TFTs today  4.H/oFrequent PVCs - Zio patch8/20with 5% PVC burden  - Amio as above  5. Left Breast Cancer. -treated w/XRT  6. Medication noncompliance -language barrier plays a role -unfortunately, unable to arrange paramedicine as she lives outside of [place], [place]  08/27/19

## 2019-08-27 NOTE — Progress Notes (Signed)
Note for work given to patient at visit.

## 2019-08-27 NOTE — Addendum Note (Signed)
Encounter addended by: Consuelo Pandy, PA-C on: 08/27/2019 2:19 PM  Actions taken: Clinical Note Signed

## 2019-08-28 LAB — T3, FREE: T3, Free: 3.3 pg/mL (ref 2.0–4.4)

## 2019-08-29 ENCOUNTER — Telehealth (HOSPITAL_COMMUNITY): Payer: Self-pay

## 2019-08-29 DIAGNOSIS — I5022 Chronic systolic (congestive) heart failure: Secondary | ICD-10-CM

## 2019-08-29 NOTE — Telephone Encounter (Signed)
-----   Message from Consuelo Pandy, [place] sent at 08/27/2019  2:14 PM EST ----- Thyroid function test c/w hyperthyroid. This may be due to her taking her amiodarone incorrectly (was still taking 400 mg daily, after recommended dose reduction to 200 mg daily). Remind her to take only 200 mg once daily of amiodarone and make sure she is still taking anastrozole for her thyroid. She should f/u again w/ endocrinology.   Her dig level is elevated a bit. Reduce digoxin frequency down to every other day (M,W,F). Repeat dig level next week.

## 2019-08-29 NOTE — Telephone Encounter (Signed)
Called pt to review lab results. Made appt for labs. Pt does not remember who her endocrinologist is. Will follow up.

## 2019-08-30 ENCOUNTER — Telehealth (HOSPITAL_COMMUNITY): Payer: Self-pay

## 2019-08-30 DIAGNOSIS — E0590 Thyrotoxicosis, unspecified without thyrotoxic crisis or storm: Secondary | ICD-10-CM

## 2019-08-30 NOTE — Telephone Encounter (Signed)
Called [facility] to follow up on referral by dr. Haroldine Laws. Pt had stated that "they wont see me". After speaking with Dr. Arman Filter office pt to be scheduled in feb. Referral resent as they needed.

## 2019-09-06 ENCOUNTER — Other Ambulatory Visit (HOSPITAL_COMMUNITY): Payer: Self-pay

## 2019-09-06 ENCOUNTER — Ambulatory Visit (HOSPITAL_COMMUNITY)
Admission: RE | Admit: 2019-09-06 | Discharge: 2019-09-06 | Disposition: A | Discharge status: Home / Self Care | Payer: Self-pay | Source: Ambulatory Visit | Attending: Internal Medicine | Admitting: Internal Medicine

## 2019-09-06 ENCOUNTER — Other Ambulatory Visit: Payer: Self-pay

## 2019-09-06 ENCOUNTER — Telehealth (HOSPITAL_COMMUNITY): Payer: Self-pay

## 2019-09-06 DIAGNOSIS — I5022 Chronic systolic (congestive) heart failure: Secondary | ICD-10-CM | POA: Insufficient documentation

## 2019-09-06 DIAGNOSIS — E0590 Thyrotoxicosis, unspecified without thyrotoxic crisis or storm: Secondary | ICD-10-CM

## 2019-09-06 LAB — DIGOXIN LEVEL: Digoxin Level: 0.5 ng/mL — ABNORMAL LOW (ref 0.8–2.0)

## 2019-09-06 NOTE — Addendum Note (Signed)
Encounter addended by: Valeda Malm, RN on: 09/06/2019 4:07 PM  Actions taken: Vitals modified, Clinical Note Signed

## 2019-09-06 NOTE — Progress Notes (Signed)
bme

## 2019-09-06 NOTE — Telephone Encounter (Signed)
Pt in office today for lab work.  She presents with her granddaughter.  Via interpretation, she notes that each time she takes digoxin she feels dizzy and have poor appetite.  BP noted today 120/82, HR 83.  D/w with NP Amy, pt advised to stop digoxin completely.  Pt has f/u feb 1st. Advised to call office if she has any concerns. Verbalized understanding.

## 2019-09-06 NOTE — Progress Notes (Signed)
Pt in office for lab work.  She presents with her granddaughter.  Via interpretation, she notes that each time she takes digoxin she feels dizzy and have poor appetite.  BP noted today 120/82, HR 83.  D/w with NP Amy, pt advised to stop digoxin completely.  Pt has f/u feb 1st. Advised to call office if she has any concerns. Verbalized understanding.

## 2019-09-11 ENCOUNTER — Telehealth (HOSPITAL_COMMUNITY): Payer: Self-pay

## 2019-09-11 MED ORDER — DIGOXIN 125 MCG PO TABS: 0.1250 mg | ORAL_TABLET | ORAL | 11 refills | Status: DC

## 2019-09-11 NOTE — Telephone Encounter (Signed)
Spoke to daughter per pt to review lab work. Pt daughter states that pt is taking digoxin 0.125mg  every other day. Made changes in chart to reflect that.

## 2019-09-11 NOTE — Telephone Encounter (Signed)
-----   Message from Consuelo Pandy, [place] sent at 09/10/2019  8:43 PM EST ----- Digoxin level improved with reduced frequency. Make sure she is still taking this only every other day and make changes to reflect on med list.

## 2019-09-20 ENCOUNTER — Other Ambulatory Visit (HOSPITAL_COMMUNITY): Payer: Self-pay | Admitting: Cardiology

## 2019-09-21 ENCOUNTER — Other Ambulatory Visit (HOSPITAL_COMMUNITY): Payer: Self-pay | Admitting: Cardiology

## 2019-09-24 ENCOUNTER — Ambulatory Visit (HOSPITAL_COMMUNITY)
Admission: RE | Admit: 2019-09-24 | Discharge: 2019-09-24 | Disposition: A | Discharge status: Home / Self Care | Payer: Self-pay | Source: Ambulatory Visit | Attending: Cardiology | Admitting: Cardiology

## 2019-09-24 ENCOUNTER — Other Ambulatory Visit: Payer: Self-pay

## 2019-09-24 ENCOUNTER — Ambulatory Visit (HOSPITAL_COMMUNITY): Payer: Self-pay

## 2019-09-24 DIAGNOSIS — E0590 Thyrotoxicosis, unspecified without thyrotoxic crisis or storm: Secondary | ICD-10-CM | POA: Insufficient documentation

## 2019-09-24 DIAGNOSIS — Z8616 Personal history of COVID-19: Secondary | ICD-10-CM | POA: Insufficient documentation

## 2019-09-24 DIAGNOSIS — I428 Other cardiomyopathies: Secondary | ICD-10-CM | POA: Insufficient documentation

## 2019-09-24 DIAGNOSIS — D649 Anemia, unspecified: Secondary | ICD-10-CM | POA: Insufficient documentation

## 2019-09-24 DIAGNOSIS — I5022 Chronic systolic (congestive) heart failure: Secondary | ICD-10-CM | POA: Insufficient documentation

## 2019-09-24 DIAGNOSIS — R0602 Shortness of breath: Secondary | ICD-10-CM | POA: Insufficient documentation

## 2019-09-24 DIAGNOSIS — R0601 Orthopnea: Secondary | ICD-10-CM | POA: Insufficient documentation

## 2019-09-24 DIAGNOSIS — I081 Rheumatic disorders of both mitral and tricuspid valves: Secondary | ICD-10-CM | POA: Insufficient documentation

## 2019-09-24 NOTE — Progress Notes (Signed)
  Echocardiogram 2D Echocardiogram has been performed.  Darlina Sicilian M 09/24/2019, 2:37 PM

## 2019-09-26 ENCOUNTER — Other Ambulatory Visit: Payer: Self-pay

## 2019-09-27 NOTE — Progress Notes (Signed)
Patient ID: Serenitie Vinton, female   DOB: 1961-06-11, 59 y.o.   MRN: [id number]   This visit occurred during the SARS-CoV-2 public health emergency.  Safety protocols were in place, including screening questions prior to the visit, additional usage of staff PPE, and extensive cleaning of exam room while observing appropriate contact time as indicated for disinfecting solutions.   HPI  Bodhi Stenglein [name] is a 59 y.o.-year-old female, referred by her cardiologist, Dr. 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  Patient is here accompanied by her granddaughter, Sheppard Coil, who translates for [place].  Patient has been on amiodarone for "several years".  She is taking 200 mg daily.  Per review of the chart, she had abnormal thyroid tests at least since 2019.  She remembers being told in the past that the tests were abnormal.  I reviewed pt's thyroid tests: Lab Results  Component Value Date   TSH 0.259 (L) 08/27/2019   TSH 0.354 07/03/2019   TSH 0.393 06/06/2019   TSH 0.575 05/07/2019   TSH 0.024 (L) 04/03/2019   TSH 0.016 (L) 03/08/2019   TSH 0.011 (L) 03/05/2019   TSH 0.176 (L) 03/13/2018   FREET4 1.75 (H) 08/27/2019   FREET4 1.35 (H) 07/03/2019   FREET4 1.63 (H) 06/06/2019   FREET4 1.09 05/07/2019   FREET4 1.65 (H) 04/03/2019   FREET4 2.43 (H) 03/05/2019   FREET4 1.59 03/13/2018   T3FREE 3.3 08/27/2019   T3FREE 3.7 07/03/2019   T3FREE 3.0 05/07/2019   T3FREE 3.6 04/03/2019   T3FREE 2.5 03/13/2018   Antithyroid antibodies: No results found for: TSI  She had a thyroid ultrasound (03/07/2019): 0.8 cm hypoechoic thyroid nodule, not worrisome. Minimal blood flow. No follow-up needed.  Pt denies - feeling nodules in neck - hoarseness - choking - SOB with lying down She does feel mucus in her neck and problems swallowing dizziness.  She has: - + mild fluctuations in weight - + fatigue - occasionally - once a week  - + insomnia - chronic - +  excessive sweating/heat intolerance -especially at night, especially on L side - + tremors - + anxiety - + rarely palpitations - no hyperdefecation - + hair loss  Pt does not have a FH of thyroid ds. No FH of thyroid cancer. No h/o radiation tx to head or neck.   No seaweed or kelp, no recent contrast studies. No steroid use. + herbal supplements: curcumin. No Biotin use.   Pt. also has a history of BrCA - + RxTx.  ROS: Constitutional: + see HPI Eyes: + blurry vision, no xerophthalmia ENT: no sore throat, + see HPI Cardiovascular: + all: CP/SOB/palpitations/leg swelling Respiratory: + both: cough/SOB Gastrointestinal: + N/+ V/no D/C Musculoskeletal: + both: muscle/joint aches Skin: no rashes, + itching on face and feet Neurological:+ tremors/no numbness/tingling/dizziness, + HAs Psychiatric: no depression/+ anxiety  Past Medical History:  Diagnosis Date  . Abnormal TSH   . Anemia   . Arthritis    "knees, hands" (04/15/2017)  . Breast cancer ([place])   . Chronic knee pain   . Chronic systolic CHF (congestive heart failure) ([place])   . COVID-19   . Does not have health insurance   . Frequent PVCs   . Hyperthyroidism 03/05/2019  . Hyperthyroidism 03/05/2019  . 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 NICM (nonischemic cardiomyopathy) ([place])    a. ? PVC cardiomyopathy. right and left heart catheterization on 04/15/17, no CAD. Fick output/index 5.28/3.34.  . Noncompliance  a. episodic noncompliance.  . Pneumonia 06/2016   Past Surgical History:  Procedure Laterality Date  . BREAST CYST EXCISION Left   . CARDIOVERSION N/A 03/09/2019   Procedure: CARDIOVERSION;  Surgeon: Jolaine Artist, MD;  Location: [facility];  Service: Cardiovascular;  Laterality: N/A;  . CARDIOVERSION N/A 07/05/2019   Procedure: CARDIOVERSION;  Surgeon: Thompson Grayer, MD;  Location: [facility];  Service: Cardiovascular;  Laterality: N/A;  . CORONARY/GRAFT ANGIOGRAPHY N/A  04/15/2017   Procedure: CORONARY/GRAFT ANGIOGRAPHY;  Surgeon: Nelva Bush, MD;  Location: [facility];  Service: Cardiovascular;  Laterality: N/A;  . RIGHT HEART CATH N/A 04/15/2017   Procedure: RIGHT HEART CATH;  Surgeon: Nelva Bush, MD;  Location: [facility];  Service: Cardiovascular;  Laterality: N/A;  . TEE WITHOUT CARDIOVERSION N/A 03/09/2019   Procedure: TRANSESOPHAGEAL ECHOCARDIOGRAM (TEE);  Surgeon: Jolaine Artist, MD;  Location: [facility];  Service: Cardiovascular;  Laterality: N/A;  . TEE WITHOUT CARDIOVERSION N/A 07/05/2019   Procedure: TRANSESOPHAGEAL ECHOCARDIOGRAM (TEE);  Surgeon: Thompson Grayer, MD;  Location: [facility];  Service: Cardiovascular;  Laterality: N/A;  . TUBAL LIGATION     Social History   Socioeconomic History  . Marital status: Single    Spouse name: Not on file  . Number of children: 4  . Years of education: Not on file  . Highest education level: Not on file  Occupational History  . Not on file  Tobacco Use  . Smoking status: Never Smoker  . Smokeless tobacco: Never Used  Substance and Sexual Activity  . Alcohol use: Not Currently    Comment: 04/15/2017 "might have a couple beers q couple months"  . Drug use: No  . Sexual activity: Not Currently  Other Topics Concern  . Not on file  Social History Narrative   N/A   Social Determinants of Health   Financial Resource Strain:   . Difficulty of Paying Living Expenses: Not on file  Food Insecurity: Unknown  . Worried About Charity fundraiser in the Last Year: Patient refused  . Ran Out of Food in the Last Year: Patient refused  Transportation Needs: Unknown  . Lack of Transportation (Medical): Patient refused  . Lack of Transportation (Non-Medical): Patient refused  Physical Activity: Unknown  . Days of Exercise per Week: Patient refused  . Minutes of Exercise per Session: Patient refused  Stress: No Stress Concern Present  . Feeling of Stress : Only a  little  Social Connections: Unknown  . Frequency of Communication with Friends and Family: Patient refused  . Frequency of Social Gatherings with Friends and Family: Patient refused  . Attends Religious Services: Patient refused  . Active Member of Clubs or Organizations: Patient refused  . Attends Archivist Meetings: Patient refused  . Marital Status: Patient refused  Intimate Partner Violence: Unknown  . Fear of Current or Ex-Partner: Patient refused  . Emotionally Abused: Patient refused  . Physically Abused: Patient refused  . Sexually Abused: Patient refused   Current Outpatient Medications on File Prior to Visit  Medication Sig Dispense Refill  . acetaminophen (TYLENOL) 650 MG CR tablet Take 650 mg by mouth every 8 (eight) hours as needed for pain.    Marland Kitchen amiodarone (PACERONE) 200 MG tablet Take 1 tablet (200 mg total) by mouth daily. 30 tablet 3  . anastrozole (ARIMIDEX) 1 MG tablet Take 1 mg by mouth daily.    Marland Kitchen apixaban (ELIQUIS) 5 MG TABS tablet Take 1 tablet (5  mg total) by mouth 2 (two) times daily. 60 tablet 11  . Ascorbic Acid (VITAMIN C) 1000 MG tablet Take 1,000 mg by mouth daily.    . calcium-vitamin D (OSCAL WITH D) 500-200 MG-UNIT TABS tablet Take by mouth.    . Cholecalciferol (D3-1000) 25 MCG (1000 UT) tablet Take 1,000 Units by mouth daily.    . Cyanocobalamin (VITAMIN B 12) 500 MCG TABS Take 1,000 mcg by mouth daily.     . digoxin (LANOXIN) 0.125 MG tablet Take 1 tablet (0.125 mg total) by mouth every other day. 15 tablet 11  . furosemide (LASIX) 40 MG tablet Take 1 tablet (40 mg total) by mouth daily. 30 tablet 3  . losartan (COZAAR) 25 MG tablet Take 1 tablet (25 mg total) by mouth at bedtime. 30 tablet 3  . spironolactone (ALDACTONE) 25 MG tablet Take 0.5 tablets (12.5 mg total) by mouth daily. Please keep upcoming appt for refills. Thank you 30 tablet 0  . Melatonin 5 MG CAPS Take 1 capsule (5 mg total) by mouth at bedtime as needed. (Patient not  taking: Reported on 09/28/2019) 30 capsule 3   No current facility-administered medications on file prior to visit.   No Known Allergies Family History  Problem Relation Age of Onset  . Hypertension Mother     PE: BP 110/70   Pulse 100   Ht 4' 9"  (1.448 m)   Wt 142 lb (64.4 kg)   SpO2 99%   BMI 30.73 kg/m  Wt Readings from Last 3 Encounters:  09/28/19 142 lb (64.4 kg)  08/27/19 140 lb (63.5 kg)  08/06/19 141 lb (64 kg)   Constitutional: normal weight, in NAD Eyes: PERRLA, EOMI, no exophthalmos, no lid lag, no stare ENT: moist mucous membranes, no thyromegaly, no thyroid bruits, no cervical lymphadenopathy Cardiovascular: RRR, No MRG Respiratory: CTA B Gastrointestinal: abdomen soft, NT, ND, BS+ Musculoskeletal: no deformities, strength intact in all 4 Skin: moist, warm, no rashes Neurological: no tremor with outstretched hands, DTR normal in all 4  ASSESSMENT: 1. 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 Patient with a chronic history of thyrotoxicosis, with some possible thyrotoxic sxs: heat intolerance (although these may be hot flashes),  anxiety, insomnia.  She only occasionally has palpitations. - We discussed that for however, most likely, her thyrotoxicosis was induced by amiodarone.  - We discussed that AIT type I occurs in patients with underlying thyroid pathology and usually indicates hyperproduction of thyroid hormones, while AIT type II result is a consequence of subacute thyroiditis with release of preformed thyroid hormones into the circulation.   Other possible causes for thyrotoxicosis are: Marland Kitchen Graves ds   . Thyroiditis . toxic multinodular goiter/ toxic adenoma (no thyroid nodules per review of her thyroid ultrasound from 02/2019). - will check the TSH, fT3 and fT4 and also add thyroid stimulating antibodies to screen for Graves' disease and ESR to screen for thyroiditis - usually, a thyroid uptake and scan is done to differentiate between  the 3 above possible etiologies, however, in the setting of amiodarone use, this is not a useful test since patient's thyroid is already saturated with iodine. 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  However, differentiation can be difficult and many times patients need to be treated for both.  Treatment for AIT type I is with methimazole while for AIT type II is with prednisone. -Based on her ultrasound results, I would suggest to start treatment with prednisone.  We will recheck her thyroid tests in 3 weeks and at that time we will see if we can start decreasing the dose and I am hoping that she can come off prednisone soon. -We discussed that even if she stops amiodarone now, this will not immediately impact her thyroid function as amiodarone persists in fat tissue for many months after stopping the medication. -We also discussed that she is at risk to develop hypothyroidism so we will need to continue to monitor her TFTs even after resolution of her thyrotoxicosis. - RTC in 3 months, but likely sooner for repeat labs  For results, call granddaughter [name]: [phone]  Orders Placed This Encounter  Procedures  . TSH  . T4, free  . T3, free  . Thyroid stimulating immunoglobulin  . Sedimentation Rate   Component     Latest Ref Rng & Units 09/28/2019  TSH     0.35 - 4.50 uIU/mL 0.53  Triiodothyronine,Free,Serum     2.3 - 4.2 pg/mL 3.8  T4,Free(Direct)     0.60 - 1.60 ng/dL 1.56  TSI     <140 % baseline <89  Sed Rate     0 - 30 mm/hr 12   All tests normal now.  No intervention needed.  I would repeat her tests when she returns in 3 months.  Philemon Kingdom, MD PhD [facility]

## 2019-09-28 ENCOUNTER — Ambulatory Visit (INDEPENDENT_AMBULATORY_CARE_PROVIDER_SITE_OTHER): Payer: Self-pay | Admitting: Internal Medicine

## 2019-09-28 ENCOUNTER — Other Ambulatory Visit: Payer: Self-pay

## 2019-09-28 ENCOUNTER — Encounter: Payer: Self-pay | Admitting: Internal Medicine

## 2019-09-28 VITALS — BP 110/70 | HR 100 | Ht <= 58 in | Wt 142.0 lb

## 2019-09-28 DIAGNOSIS — E064 Drug-induced thyroiditis: Secondary | ICD-10-CM

## 2019-09-28 DIAGNOSIS — T462X5A Adverse effect of other antidysrhythmic drugs, initial encounter: Secondary | ICD-10-CM

## 2019-09-28 DIAGNOSIS — E0590 Thyrotoxicosis, unspecified without thyrotoxic crisis or storm: Secondary | ICD-10-CM

## 2019-09-28 LAB — T4, FREE: Free T4: 1.56 ng/dL (ref 0.60–1.60)

## 2019-09-28 LAB — SEDIMENTATION RATE: Sed Rate: 12 mm/hr (ref 0–30)

## 2019-09-28 LAB — T3, FREE: T3, Free: 3.8 pg/mL (ref 2.3–4.2)

## 2019-09-28 LAB — TSH: TSH: 0.53 u[IU]/mL (ref 0.35–4.50)

## 2019-09-28 NOTE — Patient Instructions (Addendum)
Please stop at the lab.  We may need to start Prednisone after the results are back. I will let you know.  Please come back for a follow-up appointment in 3 months.   Hipertiroidismo Hyperthyroidism  El hipertiroidismo ocurre cuando la glndula tiroidea est demasiado activa (hiperactiva). La glndula tiroidea es una pequea glndula ubicada en la parte delantera inferior del cuello, justo delante de la trquea. Esta glndula produce hormonas que ayudan a Aeronautical engineer forma en la que el cuerpo [place] los alimentos para obtener energa (metabolismo) as como tambin la funcin cardaca y la funcin cerebral. Estas hormonas tambin juegan un papel para [employer] huesos fuertes. Cuando la tiroides est hiperactiva, produce una cantidad excesiva de una hormona denominada tiroxina. Cules son las causas? Esta afeccin puede ser causada por lo siguiente:  Enfermedad de Berenice Primas. Este es un trastorno en el que el sistema del cuerpo encargado de combatir las enfermedades (sistema inmunitario) ataca la glndula tiroidea. Esta es la causa ms frecuente.  Inflamacin de la glndula tiroidea.  Un tumor en la glndula tiroidea.  Uso de ciertos medicamentos, como los siguientes: ? Reemplazo de hormona tiroidea recetado. ? Suplementos a base de hierbas que imitan a las hormonas tiroideas. ? Terapia con amiodarona.  Bultos slidos o llenos de lquido en la tiroides (ndulos tiroideos).  Ingerir una gran cantidad de yodo de alimentos o medicamentos. Qu incrementa el riesgo? Es ms probable que usted sufra esta afeccin si:  Es mujer.  Tiene antecedentes familiares de afecciones tiroideas.  Fuma tabaco.  [place] un medicamento denominado litio.  Toma medicamentos que afectan el sistema inmunitario (inmunosupresores). Cules son los signos o los sntomas? Los sntomas de esta afeccin incluyen los siguientes:  Nerviosismo.  Incapacidad para Agricultural engineer.  Prdida de peso sin causa  aparente.  Diarrea.  Cambios en la textura del pelo o la piel.  Falta de latidos cardacos o ms latidos cardacos.  Frecuencia cardaca acelerada.  Ausencia de la [employer].  Temblores en las manos.  Fatiga.  Agitacin.  Problemas para dormir.  Agrandamiento de la glndula tiroidea o un bulto en la tiroides (ndulo). Es posible que tambin tenga sntomas de la enfermedad de [place], los cuales pueden incluir:  Ojos saltones.  Ojos secos.  Ojos rojos o hinchados.  Problemas visuales. Cmo se diagnostica? Esta afeccin se puede diagnosticar en funcin de lo siguiente:  Sus sntomas y antecedentes mdicos.  Un examen fsico.  Anlisis de sangre.  Ecografa de la tiroides. En [employer], se utilizan ondas sonoras para generar imgenes de la glndula tiroidea.  Gammagrafa tiroidea. Se inyecta una sustancia radiactiva en una vena y las imgenes muestran la cantidad de yodo presente en la tiroides.  Prueba de captacin de yodo radiactivo (radioactive iodine uptake test, RAIU). Una pequea cantidad de yodo radiactivo se administra por boca para determinar la cantidad de yodo que absorbe la tiroides despus de un determinado perodo de [place]. Cmo se trata? El tratamiento depende de la causa y la gravedad de la afeccin. El tratamiento puede incluir lo siguiente:  Medicamentos para reducir la cantidad de hormona tiroidea que produce su cuerpo.  Tratamiento con yodo radiactivo (terapia con yodo radiactivo). Consiste en tragar Ardelia Mems pequea dosis de yodo radiactivo, en cpsula o lquido, a fin de destruir las clulas tiroideas.  Ciruga para extirpar toda o parte de la glndula tiroidea. Es posible que necesite tomar medicamentos de reemplazo de hormona tiroidea por el resto de su vida despus de Ardelia Mems ciruga de la tiroides.  Medicamentos para ayudar a Psychologist, educational. Siga estas indicaciones en su casa:   Tome los medicamentos de venta libre y los recetados  solamente como se lo haya indicado el mdico.  No consuma ningn producto que contenga nicotina o tabaco, como cigarrillos y Psychologist, sport and exercise. Si necesita ayuda para dejar de fumar, consulte al [employer].  Siga las indicaciones del mdico con respecto a la dieta. Es posible que le indiquen limitar los alimentos que contengan yodo.  Concurra a todas las visitas de control como se lo haya indicado el mdico. Esto es importante. ? Tendr que hacerse anlisis de sangre peridicamente, de modo que el mdico pueda Electrical engineer. Comunquese con un mdico si:  Los sntomas no mejoran con Dispensing optician.  Tiene fiebre.  Est tomando medicamentos de reemplazo de la hormona tiroidea y: ? Tiene sntomas de depresin. ? Se siente constantemente cansado. ? Aumenta de [place]. Solicite ayuda de inmediato si:  Electronics engineer.  Disminuy su estado de alerta o hay cambios en la conciencia.  Siente dolor abdominal.  Siente mareos.  Tiene latidos cardacos acelerados.  Tiene latidos cardacos irregulares.  Tiene dificultad para respirar. Resumen  La glndula tiroidea es una pequea glndula ubicada en la parte delantera inferior del cuello, justo delante de la trquea.  El hipertiroidismo ocurre cuando la glndula tiroidea est demasiado activa (hiperactiva) y produce una cantidad excesiva de una hormona denominada tiroxina.  La causa ms frecuente es la enfermedad de [place], un trastorno por el cual el sistema inmunitario ataca la glndula tiroidea.  El hipertiroidismo puede causar diferentes sntomas, por ejemplo, prdida de peso inexplicable, nerviosismo, incapacidad de Agricultural engineer o cambios en los latidos cardacos.  El tratamiento puede incluir medicamentos para reducir la cantidad de hormona tiroidea que produce su cuerpo, terapia con yodo radiactivo, ciruga o medicamentos para controlar los sntomas. Esta informacin no tiene Marine scientist el consejo  del mdico. Asegrese de hacerle al mdico cualquier pregunta que tenga. Document Revised: 09/29/2017 Document Reviewed: 09/29/2017 Elsevier Patient Education  [place].

## 2019-10-01 LAB — THYROID STIMULATING IMMUNOGLOBULIN: TSI: <89 % baseline (ref <140)

## 2019-10-02 ENCOUNTER — Telehealth: Payer: Self-pay

## 2019-10-02 NOTE — Telephone Encounter (Signed)
-----   Message from Philemon Kingdom, MD sent at 10/02/2019 12:23 PM EST ----- M, please call granddaughter [name]: [phone]: All tests are now normal.  No intervention needed for now.  I will recheck her thyroid test when she returns in 3 months.

## 2019-10-03 NOTE — Telephone Encounter (Signed)
LM for callback, but also sent a letter.

## 2019-10-11 ENCOUNTER — Telehealth: Payer: Self-pay | Admitting: Internal Medicine

## 2019-10-15 ENCOUNTER — Ambulatory Visit: Payer: Self-pay | Admitting: Internal Medicine

## 2019-10-22 ENCOUNTER — Other Ambulatory Visit: Payer: Self-pay

## 2019-10-22 ENCOUNTER — Encounter: Payer: Self-pay | Admitting: Internal Medicine

## 2019-10-22 ENCOUNTER — Ambulatory Visit (INDEPENDENT_AMBULATORY_CARE_PROVIDER_SITE_OTHER): Payer: Self-pay | Admitting: Internal Medicine

## 2019-10-22 VITALS — BP 100/66 | HR 78 | Ht 59.0 in | Wt 141.4 lb

## 2019-10-22 DIAGNOSIS — I428 Other cardiomyopathies: Secondary | ICD-10-CM

## 2019-10-22 DIAGNOSIS — I4819 Other persistent atrial fibrillation: Secondary | ICD-10-CM

## 2019-10-22 NOTE — Progress Notes (Signed)
PCP: Kerin Perna, NP Primary Cardiologist: Dr Haroldine Laws Primary EP: Dr Toya Smothers Megan Keith is a 59 y.o. female who presents today for routine electrophysiology followup.  The patient presented for afib ablation in November.  Due to EF 10%, severe LA enlargement and moderate MR on TEE, the procedure was not performed.  Today, she denies symptoms of palpitations, chest pain, shortness of breath,  lower extremity edema, dizziness, presyncope, or syncope.  The patient is otherwise without complaint today.   Past Medical History:  Diagnosis Date  . Abnormal TSH   . Anemia   . Arthritis    "knees, hands" (04/15/2017)  . Breast cancer ([place])   . Chronic knee pain   . Chronic systolic CHF (congestive heart failure) ([place])   . COVID-19   . Does not have health insurance   . Frequent PVCs   . Hyperthyroidism 03/05/2019  . Hyperthyroidism 03/05/2019  . 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 NICM (nonischemic cardiomyopathy) ([place])    a. ? PVC cardiomyopathy. right and left heart catheterization on 04/15/17, no CAD. Fick output/index 5.28/3.34.  . Noncompliance    a. episodic noncompliance.  . Pneumonia 06/2016   Past Surgical History:  Procedure Laterality Date  . BREAST CYST EXCISION Left   . CARDIOVERSION N/A 03/09/2019   Procedure: CARDIOVERSION;  Surgeon: Jolaine Artist, MD;  Location: [facility] ENDOSCOPY;  Service: Cardiovascular;  Laterality: N/A;  . CARDIOVERSION N/A 07/05/2019   Procedure: CARDIOVERSION;  Surgeon: Thompson Grayer, MD;  Location: [place] CV LAB;  Service: Cardiovascular;  Laterality: N/A;  . CORONARY/GRAFT ANGIOGRAPHY N/A 04/15/2017   Procedure: CORONARY/GRAFT ANGIOGRAPHY;  Surgeon: Nelva Bush, MD;  Location: [place] CV LAB;  Service: Cardiovascular;  Laterality: N/A;  . RIGHT HEART CATH N/A 04/15/2017   Procedure: RIGHT HEART CATH;  Surgeon: Nelva Bush, MD;  Location: [place] CV LAB;  Service: Cardiovascular;   Laterality: N/A;  . TEE WITHOUT CARDIOVERSION N/A 03/09/2019   Procedure: TRANSESOPHAGEAL ECHOCARDIOGRAM (TEE);  Surgeon: Jolaine Artist, MD;  Location: [facility] ENDOSCOPY;  Service: Cardiovascular;  Laterality: N/A;  . TEE WITHOUT CARDIOVERSION N/A 07/05/2019   Procedure: TRANSESOPHAGEAL ECHOCARDIOGRAM (TEE);  Surgeon: Thompson Grayer, MD;  Location: [place] CV LAB;  Service: Cardiovascular;  Laterality: N/A;  . TUBAL LIGATION      ROS- all systems are reviewed and negatives except as per HPI above  Current Outpatient Medications  Medication Sig Dispense Refill  . acetaminophen (TYLENOL) 650 MG CR tablet Take 650 mg by mouth every 8 (eight) hours as needed for pain.    Marland Kitchen amiodarone (PACERONE) 200 MG tablet Take 1 tablet (200 mg total) by mouth daily. 30 tablet 3  . anastrozole (ARIMIDEX) 1 MG tablet Take 1 mg by mouth daily.    Marland Kitchen apixaban (ELIQUIS) 5 MG TABS tablet Take 1 tablet (5 mg total) by mouth 2 (two) times daily. 60 tablet 11  . Ascorbic Acid (VITAMIN C) 1000 MG tablet Take 1,000 mg by mouth daily.    . calcium-vitamin D (OSCAL WITH D) 500-200 MG-UNIT TABS tablet Take by mouth.    . Cholecalciferol (D3-1000) 25 MCG (1000 UT) tablet Take 1,000 Units by mouth daily.    . Cyanocobalamin (VITAMIN B 12) 500 MCG TABS Take 1,000 mcg by mouth daily.     . digoxin (LANOXIN) 0.125 MG tablet Take 1 tablet (0.125 mg total) by mouth every other day. 15 tablet 11  . furosemide (LASIX) 40 MG tablet Take 1 tablet (40  mg total) by mouth daily. 30 tablet 3  . losartan (COZAAR) 25 MG tablet Take 1 tablet (25 mg total) by mouth at bedtime. 30 tablet 3  . Melatonin 5 MG CAPS Take 1 capsule (5 mg total) by mouth at bedtime as needed. 30 capsule 3  . spironolactone (ALDACTONE) 25 MG tablet Take 0.5 tablets (12.5 mg total) by mouth daily. Please keep upcoming appt for refills. Thank you 30 tablet 0   No current facility-administered medications for this visit.    Physical Exam: Vitals:   10/22/19  1430  BP: 100/66  Pulse: 78  SpO2: 97%  Weight: 141 lb 6.4 oz (64.1 kg)  Height: 4\' 11"  (1.499 m)    GEN- The patient is well appearing, alert and oriented x 3 today.   Head- normocephalic, atraumatic Eyes-  Sclera clear, conjunctiva pink Ears- hearing intact Oropharynx- clear Lungs- normal work of breathing Heart- Regular rate and rhythm  GI- soft, NT, ND, + BS Extremities- no clubbing, cyanosis, or edema  Wt Readings from Last 3 Encounters:  10/22/19 141 lb 6.4 oz (64.1 kg)  09/28/19 142 lb (64.4 kg)  08/27/19 140 lb (63.5 kg)    EKG tracing ordered today is personally reviewed and shows sinus rhythm  Assessment and Plan:  1. Persistent afib Maintaining sinus rhythm with amiodarone, but with thyrotoxicosis She has severe LA enlargement Continue eliquis for chads2vasc score of 2  She has hyperthyroidism and is on methimazole We will need to continue to follow closely while on amiodarone to avoid toxicity  Risk, benefits, and alternatives to EP study and radiofrequency ablation for afib were also discussed in detail today. These risks include but are not limited to stroke, bleeding, vascular damage, tamponade, perforation, damage to the esophagus, lungs, and other structures, pulmonary vein stenosis, worsening renal function, and death. The patient understands these risk and wishes to proceed.  Unfortunately, she does not currently have insurance.  She is in the process of obtaining insurance and would like to wait to have her procedure until after insurance is obtained.  Once the patient is ready, we will therefore proceed with catheter ablation at the next available time.  Carto, ICE, anesthesia are requested for the procedure.  Will also obtain cardiac CT prior to the procedure to exclude LAA thrombus and further evaluate atrial anatomy.   2. Moderate MR with severe LA enlargement and afib I have discussed with Dr Haroldine Laws He will review echo.  If her MR is felt to be  surgical, we could proceed with MV repair, MAZE, and LAA removal rather than ablation.  3. Chronic systolic dysfunction EF has improved from 5-10% at time of TEE to her baseline of 30% Followed closely with Dr Haroldine Laws  4. PVCs No PVCs on ekg today  Thompson Grayer MD, [facility] 10/22/2019 2:44 PM

## 2019-10-22 NOTE — Patient Instructions (Addendum)
Medication Instructions:  Your physician recommends that you continue on your current medications as directed. Please refer to the Current Medication list given to you today.  Labwork: None ordered.  Testing/Procedures: None ordered.  Follow-Up:  Contact your social worker regarding insurance coverage.  Any Other Special Instructions Will Be Listed Below (If Applicable).  If you need a refill on your cardiac medications before your next appointment, please call your pharmacy.    Cardiac Ablation Cardiac ablation is a procedure to disable (ablate) a small amount of heart tissue in very specific places. The heart has many electrical connections. Sometimes these connections are abnormal and can cause the heart to beat very fast or irregularly. Ablating some of the problem areas can improve the heart rhythm or return it to normal. Ablation may be done for people who:  Have Wolff-Parkinson-White syndrome.  Have fast heart rhythms (tachycardia).  Have taken medicines for an abnormal heart rhythm (arrhythmia) that were not effective or caused side effects.  Have a high-risk heartbeat that may be life-threatening. During the procedure, a small incision is made in the neck or the groin, and a long, thin, flexible tube (catheter) is inserted into the incision and moved to the heart. Small devices (electrodes) on the tip of the catheter will send out electrical currents. A type of X-ray (fluoroscopy) will be used to help guide the catheter and to provide images of the heart. Tell a health care provider about:  Any allergies you have.  All medicines you are taking, including vitamins, herbs, eye drops, creams, and over-the-counter medicines.  Any problems you or family members have had with anesthetic medicines.  Any blood disorders you have.  Any surgeries you have had.  Any medical conditions you have, such as kidney failure.  Whether you are pregnant or may be pregnant. What are  the risks? Generally, this is a safe procedure. However, problems may occur, including:  Infection.  Bruising and bleeding at the catheter insertion site.  Bleeding into the chest, especially into the sac that surrounds the heart. This is a serious complication.  Stroke or blood clots.  Damage to other structures or organs.  Allergic reaction to medicines or dyes.  Need for a permanent pacemaker if the normal electrical system is damaged. A pacemaker is a small computer that sends electrical signals to the heart and helps your heart beat normally.  The procedure not being fully effective. This may not be recognized until months later. Repeat ablation procedures are sometimes required. What happens before the procedure?  Follow instructions from your health care provider about eating or drinking restrictions.  Ask your health care provider about: ? Changing or stopping your regular medicines. This is especially important if you are taking diabetes medicines or blood thinners. ? Taking medicines such as aspirin and ibuprofen. These medicines can thin your blood. Do not take these medicines before your procedure if your health care provider instructs you not to.  Plan to have someone take you home from the hospital or clinic.  If you will be going home right after the procedure, plan to have someone with you for 24 hours. What happens during the procedure?  To lower your risk of infection: ? Your health care team will wash or sanitize their hands. ? Your skin will be washed with soap. ? Hair may be removed from the incision area.  An IV tube will be inserted into one of your veins.  You will be given a medicine to help you  relax (sedative).  The skin on your neck or groin will be numbed.  An incision will be made in your neck or your groin.  A needle will be inserted through the incision and into a large vein in your neck or groin.  A catheter will be inserted into the  needle and moved to your heart.  Dye may be injected through the catheter to help your surgeon see the area of the heart that needs treatment.  Electrical currents will be sent from the catheter to ablate heart tissue in desired areas. There are three types of energy that may be used to ablate heart tissue: ? Heat (radiofrequency energy). ? Laser energy. ? Extreme cold (cryoablation).  When the necessary tissue has been ablated, the catheter will be removed.  Pressure will be held on the catheter insertion area to prevent excessive bleeding.  A bandage (dressing) will be placed over the catheter insertion area. The procedure may vary among health care providers and hospitals. What happens after the procedure?  Your blood pressure, heart rate, breathing rate, and blood oxygen level will be monitored until the medicines you were given have worn off.  Your catheter insertion area will be monitored for bleeding. You will need to lie still for a few hours to ensure that you do not bleed from the catheter insertion area.  Do not drive for 24 hours or as long as directed by your health care provider. Summary  Cardiac ablation is a procedure to disable (ablate) a small amount of heart tissue in very specific places. Ablating some of the problem areas can improve the heart rhythm or return it to normal.  During the procedure, electrical currents will be sent from the catheter to ablate heart tissue in desired areas. This information is not intended to replace advice given to you by your health care provider. Make sure you discuss any questions you have with your health care provider. Document Revised: 01/30/2018 Document Reviewed: 06/28/2016 Elsevier Patient Education  [place].

## 2019-10-23 ENCOUNTER — Telehealth: Payer: Self-pay | Admitting: Internal Medicine

## 2019-10-23 NOTE — Telephone Encounter (Signed)
   Pt's daughter wanted to schedule pt's cardiac ablation ordered by Dr. Rayann Heman.  Please advise

## 2019-10-23 NOTE — Telephone Encounter (Signed)
Spoke with daughter and made her aware that Dr. Jackalyn Lombard nurse is out of the office but I will send to her to f/u when back in the office.  Inquired about dates.  She states they would like to do the ablation on 3/9 and the covid screen on 3/5.  Advised I will send this info.

## 2019-10-24 NOTE — Telephone Encounter (Signed)
Returned call to daughter.  Per daughter the caseworker for her mother had said Pt should go ahead with procedure and then emergency insurance would kick in after (?)  Asked daughter if it would be ok for this nurse to speak with caseworker.  Case work is Ms. Eugenio Hoes at [phone].  Left message for Ms. Eugenio Hoes requesting call back to discuss insurance.

## 2019-10-24 NOTE — Telephone Encounter (Signed)
Spoke with case worker for [employer].  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  Pt would be covered for urgent/emergent procedures only.    Per caseworker, Pt can apply for medical care.   Would require Pt to call to request application.    Then medical records could be faxed to [facility] for review by [facility] for need.  Advised Pt's daughter.  She would like to have nurse send records.  Will fax records as requested to [employer].  Will await further needs.  Pt to follow up with HF this month.

## 2019-10-25 NOTE — Telephone Encounter (Signed)
Call back received from [place] services to clarify receipt of medical records.  Per the social services- they do NOT pre approve any procedures.  If a Pt has an urgent procedure they can THEN submit medical records for POSSIBLE reimbursement AFTER the fact.  Advised that records sent for Pt were to pre approve a procedure.  Advised procedure was an elective outpatient procedure.  Per case worker, [employer] in all probability would not pay for this procedure.  Will call Pt's daughter to advise.

## 2019-10-29 NOTE — Telephone Encounter (Signed)
Spoke with daughter.  Advised that per social services, no prior approval is provided for elective cases-[place] SS.  Daughter indicates understanding.  This nurse has sent a message to [place] with social work to see if any assistance can be provided for Pt (orange card?).  Will provide any assistance possible for Pt.

## 2019-10-30 ENCOUNTER — Telehealth: Payer: Self-pay | Admitting: Licensed Clinical Social Worker

## 2019-10-30 DIAGNOSIS — C50412 Malignant neoplasm of upper-outer quadrant of left female breast: Secondary | ICD-10-CM

## 2019-10-30 DIAGNOSIS — C50812 Malignant neoplasm of overlapping sites of left female breast: Secondary | ICD-10-CM

## 2019-10-30 NOTE — Telephone Encounter (Signed)
CSW spoke with daughter about need for resources for financial aide for medical bills. CSW advised daughter to contact Financial Counseling for application and assistance with the [place]. Daughter verbalizes understanding and will follow up with patient. CSW available as needed. Raquel Sarna, [place], [place]

## 2019-10-30 NOTE — Progress Notes (Signed)
  Heart and Vascular Care Navigation  10/30/2019  Britanya Reincke 16-Mar-1961 [id number]  Reason for Referral: Patient in need of financial assistance for medical coverage.  Assessment: Patient is uninsured and in need of an outpatient elective procedure. She is not a citizen and therefore not eligible to apply for medicaid benefits.    HRT/VAS Care Coordination    Living arrangements for the past 2 months  Single Family Home   Lives with:  [place] Devices/Equipment  None   DME Agency  NA   [facility])      Social History: SDOH Screenings   Alcohol Screen:   . Last Alcohol Screening Score (AUDIT): Not on file  Depression (PHQ2-9): Medium Risk  . PHQ-2 Score: 6  Financial Resource Strain: High Risk  . Difficulty of Paying Living Expenses: Very hard  Food Insecurity: Unknown  . Worried About Charity fundraiser in the Last Year: Patient refused  . Ran Out of Food in the Last Year: Patient refused  Housing:   . Last Housing Risk Score: Not on file  Physical Activity: Unknown  . Days of Exercise per Week: Patient refused  . Minutes of Exercise per Session: Patient refused  Social Connections: Unknown  . Frequency of Communication with Friends and Family: Patient refused  . Frequency of Social Gatherings with Friends and Family: Patient refused  . Attends Religious Services: Patient refused  . Active Member of Clubs or Organizations: Patient refused  . Attends Archivist Meetings: Patient refused  . Marital Status: Patient refused  Stress: No Stress Concern Present  . Feeling of Stress : Only a little  Tobacco Use: Low Risk   . Smoking Tobacco Use: Never Smoker  . Smokeless Tobacco Use: Never Used  Transportation Needs: Unknown  . Lack of Transportation (Medical): Patient refused  . Lack of Transportation (Non-Medical): Patient refused    SDOH Interventions: Financial Resources:  Electronics engineer Interventions: Conservation officer, nature for [place]:     Housing Insecurity:     Transportation:          Barriers to Care: Patient is not a citizen alhtough appears she may be able to receive assistance through [employer].  Follow-up plan: Daughter will assist patient with call to Financial Counseling.   Patient To Do: Follow up with Financial Counseling Care Navigation Team To Do: Available if needed Follow-up with:  As needed Raquel Sarna, Marlinda Mike, [place]

## 2019-11-05 ENCOUNTER — Other Ambulatory Visit: Payer: Self-pay

## 2019-11-05 ENCOUNTER — Encounter (HOSPITAL_COMMUNITY): Payer: Self-pay | Admitting: Internal Medicine

## 2019-11-05 ENCOUNTER — Ambulatory Visit (HOSPITAL_COMMUNITY)
Admission: RE | Admit: 2019-11-05 | Discharge: 2019-11-05 | Disposition: A | Discharge status: Home / Self Care | Payer: Self-pay | Source: Ambulatory Visit | Attending: Internal Medicine | Admitting: Internal Medicine

## 2019-11-05 VITALS — BP 112/78 | HR 84 | Wt 144.2 lb

## 2019-11-05 DIAGNOSIS — I5022 Chronic systolic (congestive) heart failure: Secondary | ICD-10-CM

## 2019-11-05 DIAGNOSIS — I5023 Acute on chronic systolic (congestive) heart failure: Secondary | ICD-10-CM | POA: Insufficient documentation

## 2019-11-05 DIAGNOSIS — I428 Other cardiomyopathies: Secondary | ICD-10-CM | POA: Insufficient documentation

## 2019-11-05 DIAGNOSIS — I471 Supraventricular tachycardia: Secondary | ICD-10-CM | POA: Insufficient documentation

## 2019-11-05 DIAGNOSIS — Z8249 Family history of ischemic heart disease and other diseases of the circulatory system: Secondary | ICD-10-CM | POA: Insufficient documentation

## 2019-11-05 DIAGNOSIS — I480 Paroxysmal atrial fibrillation: Secondary | ICD-10-CM

## 2019-11-05 DIAGNOSIS — Z9119 Patient's noncompliance with other medical treatment and regimen: Secondary | ICD-10-CM | POA: Insufficient documentation

## 2019-11-05 DIAGNOSIS — Z7901 Long term (current) use of anticoagulants: Secondary | ICD-10-CM | POA: Insufficient documentation

## 2019-11-05 DIAGNOSIS — Z79899 Other long term (current) drug therapy: Secondary | ICD-10-CM | POA: Insufficient documentation

## 2019-11-05 DIAGNOSIS — Z8616 Personal history of COVID-19: Secondary | ICD-10-CM | POA: Insufficient documentation

## 2019-11-05 DIAGNOSIS — I493 Ventricular premature depolarization: Secondary | ICD-10-CM

## 2019-11-05 DIAGNOSIS — Z9114 Patient's other noncompliance with medication regimen: Secondary | ICD-10-CM | POA: Insufficient documentation

## 2019-11-05 DIAGNOSIS — E0590 Thyrotoxicosis, unspecified without thyrotoxic crisis or storm: Secondary | ICD-10-CM | POA: Insufficient documentation

## 2019-11-05 DIAGNOSIS — C50912 Malignant neoplasm of unspecified site of left female breast: Secondary | ICD-10-CM | POA: Insufficient documentation

## 2019-11-05 DIAGNOSIS — I4819 Other persistent atrial fibrillation: Secondary | ICD-10-CM | POA: Insufficient documentation

## 2019-11-05 LAB — CBC
HCT: 38.6 % (ref 36.0–46.0)
Hemoglobin: 12.2 g/dL (ref 12.0–15.0)
MCH: 31.3 pg (ref 26.0–34.0)
MCHC: 31.6 g/dL (ref 30.0–36.0)
MCV: 99 fL (ref 80.0–100.0)
Platelets: 277 10*3/uL (ref 150–400)
RBC: 3.9 MIL/uL (ref 3.87–5.11)
RDW: 14.6 % (ref 11.5–15.5)
WBC: 7.3 10*3/uL (ref 4.0–10.5)
nRBC: 0 % (ref 0.0–0.2)

## 2019-11-05 LAB — BRAIN NATRIURETIC PEPTIDE: B Natriuretic Peptide: 516.7 pg/mL — ABNORMAL HIGH (ref 0.0–100.0)

## 2019-11-05 LAB — BASIC METABOLIC PANEL
Anion gap: 3 — ABNORMAL LOW (ref 5–15)
BUN: 16 mg/dL (ref 6–20)
CO2: 24 mmol/L (ref 22–32)
Calcium: 8.7 mg/dL — ABNORMAL LOW (ref 8.9–10.3)
Chloride: 111 mmol/L (ref 98–111)
Creatinine, Ser: 0.63 mg/dL (ref 0.44–1.00)
GFR calc Af Amer: >60 mL/min (ref >60)
GFR calc non Af Amer: >60 mL/min (ref >60)
Glucose, Bld: 99 mg/dL (ref 70–99)
Potassium: 3.9 mmol/L (ref 3.5–5.1)
Sodium: 138 mmol/L (ref 135–145)

## 2019-11-05 MED ORDER — CARVEDILOL 3.125 MG PO TABS: 3.1250 mg | ORAL_TABLET | Freq: Two times a day (BID) | ORAL | 3 refills | Status: DC

## 2019-11-05 NOTE — Patient Instructions (Addendum)
Detener la digoxina  Inicie Carvedilol 3,125 mg [employer] al da  Laboratorios realizados hoy, le notificaremos los [employer].  Seguimiento en 2 meses con ecocardiograma: jueves 26 de mayo a las 11 a. M.  Le proporcionamos la documentacin para solicitar asistencia financiera a [employer] [place]

## 2019-11-05 NOTE — Progress Notes (Signed)
Advanced Heart Failure Clinic Note   Referring Physician: PCP: Kerin Perna, NP PCP-Cardiologist: Glori Bickers, MD  EP: Dr. 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   She presented to [facility] on 04/13/17 with neck pain, SOB and orthopnea. Last EF prior to that reported by Dr. [name] was 35%. Repeat echo showed reduced EF of 15%, she was transferred to [facility] for further evaluation. Underwent right and left heart catheterization on 04/15/17, no CAD. Fick output/index 5.28/3.34. Full results below.   Noted to have >20% PVC on telemetry. She was started on IV Amio and transitioned to po Amio. Also started on digoxin and spiro prior to discharge. BP was too soft to add any other medications. Discharge weight was 143 pounds.   Admitted 7/13-18/2020 with ADHF in setting of new-onset AF in the setting of hyperthyroidism. TEE with EF 10% RV ok. Started on amiodarone and diuresed. Underwent TEE/DC-CV and felt much better. Also treated with methimazole and prednisone.   Seen again 04/2019.Weightwasup a few pounds. Zio patch placed to monitor PVC burden   Zio patch 8/20 - NSR avg HR 75 - 13 runs NSVT longest 14 beats - 59 runs SVT longest 9.4 seconds - PVC burden 4.8%  Admitted10/14-10/17/20 to [facility] for COVID 19 infection c/b gastroenteritis and recurrent afib w/ RVR. Was loaded on IV amiodaroneand IV digoxin and rate improved but did not convert back to NSR. Was placed on Eliquis and discharged home on PO digoxin and metoprolol. Was advised to f/u in [facility] for further management.   Was seen in AHF clinic 07/03/19 for f/u with rapid AF in the 150s. She was volume overloaded. She was directly admitted to [facility] for further management  Was placed  on IV amiodarone for afib. Eliquis resumed. TSH low normal, 0.354. Free T3 WNL 3.7. Free elevated T4 1.35. Methimazole and prednisone continued for hyperthyroidism.  EP was consulted. Initial plans were for PVI ablation but procedure canceled after TEE showed severely reduced EF at 5% and severe LAE. Pt underwent DCCV instead. Converted to sinus tach. After IV amiodarone load, was switched to oral. She improved post conversion as well as w/ diuresis. Was switched from IV to PO Lasix. Co-ox was monitored and remained stable, 60-65%. No IV inotrope requirements. She was also treated w/ Losartan (BP too soft for Entesto), spironolactone and digoxin. Overall not felt to be a great candidate for advanced therapies, pharmacist did thorough home med review and she appears to be quite noncompliant. Discharged 07/10/19.  Saw Dr. Rayann Heman on 10/22/19 and was still in NSR. Discussed possible AF ablation. There was concern over degree of MR and LAE.   Echo 2/1 EF 30-35% moderate MR. Personally reviewed  Return today w/ granddaughter for routine HF f/u . She says her breathing is much better. Still occasional mild SOB. No edema, orthopnea or PND. Apparently cannot get AF ablation because doesn't have full Medicaid (can't get full Medicaid due to immigration status) No palpitations or CP.  Says digoxin makes her sick.    Cardiac Studies   Echo 03/2017 EF 10-15% Echo 07/2017 EF 25-30%  Echo 11/2017 EF 25-30% Echo 06/2018: EF ~30% (per Dr Haroldine Laws) Echo 8/20 EF 25-30% TEE 11/20 EF 5-10%  R/LHC 8/ 018 LHC - normal cors  Right Heart RA (mean): 5 mmHg RV (S/EDP): 52/4 mmHg PA (S/D, mean): 52/20 (31) mmHg PCWP (mean): 30 mmHg  Ao sat: 96% PA sat: 71% RA sat: 74%  Fick CO: 5.3 L/min Fick CI: 3.3 L/min/m^2  SVR: 1,101 dynscm-5   Past Medical History:  Diagnosis Date  . Abnormal TSH   . Anemia   . Arthritis    "knees, hands" (04/15/2017)  . Breast cancer ([place])   . Chronic knee pain   . Chronic  systolic CHF (congestive heart failure) ([place])   . COVID-19   . Does not have health insurance   . Frequent PVCs   . Hyperthyroidism 03/05/2019  . Hyperthyroidism 03/05/2019  . 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 NICM (nonischemic cardiomyopathy) ([place])    a. ? PVC cardiomyopathy. right and left heart catheterization on 04/15/17, no CAD. Fick output/index 5.28/3.34.  . Noncompliance    a. episodic noncompliance.  . Pneumonia 06/2016    Current Outpatient Medications  Medication Sig Dispense Refill  . acetaminophen (TYLENOL) 650 MG CR tablet Take 650 mg by mouth every 8 (eight) hours as needed for pain.    Marland Kitchen amiodarone (PACERONE) 200 MG tablet Take 1 tablet (200 mg total) by mouth daily. 30 tablet 3  . anastrozole (ARIMIDEX) 1 MG tablet Take 1 mg by mouth daily.    Marland Kitchen apixaban (ELIQUIS) 5 MG TABS tablet Take 1 tablet (5 mg total) by mouth 2 (two) times daily. 60 tablet 11  . Ascorbic Acid (VITAMIN C) 1000 MG tablet Take 1,000 mg by mouth daily.    . Cyanocobalamin (VITAMIN B 12) 500 MCG TABS Take 1,000 mcg by mouth daily.     . digoxin (LANOXIN) 0.125 MG tablet Take 1 tablet (0.125 mg total) by mouth every other day. 15 tablet 11  . furosemide (LASIX) 40 MG tablet Take 1 tablet (40 mg total) by mouth daily. 30 tablet 3  . losartan (COZAAR) 25 MG tablet Take 1 tablet (25 mg total) by mouth at bedtime. 30 tablet 3  . spironolactone (ALDACTONE) 25 MG tablet Take 0.5 tablets (12.5 mg total) by mouth daily. Please keep upcoming appt for refills. Thank you 30 tablet 0   No current facility-administered medications for this encounter.    No Known Allergies    Social History   Socioeconomic History  . Marital status: Single    Spouse name: Not on file  . Number of children: Not on file  . Years of education: Not on file  . Highest education level: Not on file  Occupational History  . Not on file  Tobacco Use  . Smoking status: Never Smoker  . Smokeless tobacco: Never  Used  Substance and Sexual Activity  . Alcohol use: Not Currently    Comment: 04/15/2017 "might have a couple beers q couple months"  . Drug use: No  . Sexual activity: Not Currently  Other Topics Concern  . Not on file  Social History Narrative   N/A   Social Determinants of Health   Financial Resource Strain: High Risk  . Difficulty of Paying Living Expenses: Very hard  Food Insecurity: Unknown  . Worried About Charity fundraiser in the Last Year: Patient refused  . Ran Out of Food in the Last Year: Patient refused  Transportation Needs: Unknown  . Lack of Transportation (Medical): Patient refused  . Lack of Transportation (Non-Medical): Patient refused  Physical Activity: Unknown  . Days of Exercise per Week: Patient refused  .  Minutes of Exercise per Session: Patient refused  Stress: No Stress Concern Present  . Feeling of Stress : Only a little  Social Connections: Unknown  . Frequency of Communication with Friends and Family: Patient refused  . Frequency of Social Gatherings with Friends and Family: Patient refused  . Attends Religious Services: Patient refused  . Active Member of Clubs or Organizations: Patient refused  . Attends Archivist Meetings: Patient refused  . Marital Status: Patient refused  Intimate Partner Violence: Unknown  . Fear of Current or Ex-Partner: Patient refused  . Emotionally Abused: Patient refused  . Physically Abused: Patient refused  . Sexually Abused: Patient refused      Family History  Problem Relation Age of Onset  . Hypertension Mother     Vitals:   11/05/19 1114  BP: 112/78  Pulse: 84  SpO2: 100%  Weight: 65.4 kg (144 lb 3.2 oz)     PHYSICAL EXAM: General:  Well appearing. No resp difficulty HEENT: normal Neck: supple. no JVD. Carotids 2+ bilat; no bruits. No lymphadenopathy or thryomegaly appreciated. Cor: PMI nondisplaced. Regular rate & rhythm. Soft TR murmur  Lungs: clear Abdomen: soft, nontender,  nondistended. No hepatosplenomegaly. No bruits or masses. Good bowel sounds. Extremities: no cyanosis, clubbing, rash, tr edema Neuro: alert & orientedx3, cranial nerves grossly intact. moves all 4 extremities w/o difficulty. Affect pleasant  ECG: NSR 74 No ST-T wave abnormalities.  Personally reviewed   ASSESSMENT & PLAN:  1. Atrial Fibrillation, persistent:  - s/p previous DC-CV in 7/20. 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  Previously was on amio for PVCs but developed hyperthyroidism, now being treated w/ methimazole. Admitted 06/2019 for persistent afib w/ RVR + a/c systolic CHF. Loaded w/ IV amiodarone and evaluated by EP. Initial plans were for PVI ablation but procedure canceled after TEE showed severely reduced EF at 5% and severe LAE. Pt underwent DCCV instead.  - Remains in NSR today - NYHA II. Volume ok  - Continue amiodarone 200 daily. Following with Dr. Monna Fam for treatment of thyrotoxicity - Stop digoxin  - Continue Eliquis 5 mg bid. Denies abnormal bleeding.  - Has seen Dr. Rayann Heman in 2/21. Planning AF ablation - I agree with plan for AF ablation given in setting of severity of tachycardic CM in recent past. If unable to maintain NSR will need AVN ablation and CRT - Unfortunately does not have insurance coverage for AF ablation. I have asked SW to see her today  2.Chronic systolic [id number], normal cors in 03/2017. 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 03/2017 EF 15-20% Echo 07/28/17 EF 25-30%  - Echo 06/2018 EF ~30% - Echo 7/20 in setting of PAF with RVR. EF 10% - Echo 8/20 EF 25-30% - TEE 11/20 EF 5% - Echo 2/21 EF 30-35% - EF improving with maintenance of NSR - Volume status stable. NYHA II - Continue Lasix 40 mg daily  - Continue Losartan 25 mg qhs.  - Continue spironolactone 12.5 daily.  - Stop digoxin due to intolerance - Start carvedilol 3.125 bid - Not great candidate for advanced therapies, pharmacist did thorough home med review today and she appears to be  quite noncompliant.  - Check labs today  3. 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 Free T3 WNL 3.7. Free elevated T4 1.35 - Off methimazole  - Continue amio despite amio-induced hyperthyroidism as it is our only option. - Follows with Dr. Monna Fam last seen in 2/21 and thyroid ok   4.H/oFrequent PVCs -  Zio patch8/20with 5% PVC burden  - Amio as above  5. Left Breast Cancer. -treated w/XRT  6. Medication noncompliance -language barrier plays a role -unfortunately, unable to arrange paramedicine as she lives outside of [place], [place] 11/05/19

## 2019-11-08 ENCOUNTER — Telehealth (HOSPITAL_COMMUNITY): Payer: Self-pay

## 2019-11-08 NOTE — Telephone Encounter (Signed)
Daughter called to verify med instructions for coreg. Advised 1 tab twice a day. She thought it was twice a week. Verbalized understanding of direction.

## 2019-12-13 ENCOUNTER — Other Ambulatory Visit (HOSPITAL_COMMUNITY): Payer: Self-pay | Admitting: Internal Medicine

## 2019-12-14 ENCOUNTER — Telehealth: Payer: Self-pay

## 2019-12-14 NOTE — Telephone Encounter (Signed)
-----   Message from Louann Liv, [place] sent at 10/30/2019 12:04 PM EST ----- Regarding: RE: Pt assistance with medical costs? I looked into the [facility] and it looks like she could qualify. I called the daughter and provided her with the contact information to apply. She verbalized understanding and said she would follow up with the patient. It will most likely take a few weeks to complete the process but should have some sort of coverage when all done. Hope this helps. Kennyth Lose  ----- Message ----- From: Damian Leavell, RN Sent: 10/25/2019   1:04 PM EST To: Louann Liv, LCSW Subject: Pt assistance with medical costs?              I'm not sure what more to do for this Pt.  She saw Dr. Rayann Heman and was interested in an afib ablation.  When I tried to get her cardiac CT precerted we discovered she did not have insurance.  Upon further investigation, she is not a [place] citizen and does not qualify for insurance.    Per [employer], coverage for her is only provided if it is urgent and "after the fact".  So she would not be appropriate for a planned outpatient elective procedure.  I'm not sure if there is anything else we can do for her?    I'm just exhausting all possibilities.  Sonia Baller RN

## 2019-12-26 ENCOUNTER — Other Ambulatory Visit: Payer: Self-pay

## 2019-12-28 ENCOUNTER — Encounter: Payer: Self-pay | Admitting: Internal Medicine

## 2019-12-28 ENCOUNTER — Ambulatory Visit (INDEPENDENT_AMBULATORY_CARE_PROVIDER_SITE_OTHER): Payer: Self-pay | Admitting: Internal Medicine

## 2019-12-28 ENCOUNTER — Other Ambulatory Visit: Payer: Self-pay

## 2019-12-28 VITALS — BP 108/60 | HR 85 | Ht 59.0 in | Wt 144.0 lb

## 2019-12-28 DIAGNOSIS — T462X5A Adverse effect of other antidysrhythmic drugs, initial encounter: Secondary | ICD-10-CM

## 2019-12-28 DIAGNOSIS — E064 Drug-induced thyroiditis: Secondary | ICD-10-CM

## 2019-12-28 LAB — T4, FREE: Free T4: 1.66 ng/dL — ABNORMAL HIGH (ref 0.60–1.60)

## 2019-12-28 LAB — T3, FREE: T3, Free: 3.6 pg/mL (ref 2.3–4.2)

## 2019-12-28 LAB — TSH: TSH: 0.15 u[IU]/mL — ABNORMAL LOW (ref 0.35–4.50)

## 2019-12-28 MED ORDER — PREDNISONE 5 MG PO TABS: 5.0000 mg | ORAL_TABLET | Freq: Every day | ORAL | 1 refills | Status: DC

## 2019-12-28 NOTE — Patient Instructions (Signed)
Please stop at the lab.  Please come back for labs in 6 months and for a visit in 1 year.

## 2019-12-28 NOTE — Progress Notes (Signed)
Patient ID: Megan Keith, female   DOB: 1961/02/24, 59 y.o.   MRN: [id number]   This visit occurred during the SARS-CoV-2 public health emergency.  Safety protocols were in place, including screening questions prior to the visit, additional usage of staff PPE, and extensive cleaning of exam room while observing appropriate contact time as indicated for disinfecting solutions.   HPI  Megan Keith is a 59 y.o.-year-old female, initially referred by her cardiologist, Dr. 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  She is accompanied by her granddaughter, Sheppard Coil, who translates for [place].  Last visit 3 months ago.    Patient has been on amiodarone for "several years".  She is taking 200 mg daily.  Per review of the chart, she has had abnormal thyroid test at least since 2019.  She does remember being told in the past that the tests were abnormal.  We reviewed her TFTs: Lab Results  Component Value Date   TSH 0.53 09/28/2019   TSH 0.259 (L) 08/27/2019   TSH 0.354 07/03/2019   TSH 0.393 06/06/2019   TSH 0.575 05/07/2019   TSH 0.024 (L) 04/03/2019   TSH 0.016 (L) 03/08/2019   TSH 0.011 (L) 03/05/2019   TSH 0.176 (L) 03/13/2018   FREET4 1.56 09/28/2019   FREET4 1.75 (H) 08/27/2019   FREET4 1.35 (H) 07/03/2019   FREET4 1.63 (H) 06/06/2019   FREET4 1.09 05/07/2019   FREET4 1.65 (H) 04/03/2019   FREET4 2.43 (H) 03/05/2019   FREET4 1.59 03/13/2018   T3FREE 3.8 09/28/2019   T3FREE 3.3 08/27/2019   T3FREE 3.7 07/03/2019   T3FREE 3.0 05/07/2019   T3FREE 3.6 04/03/2019   T3FREE 2.5 03/13/2018   Her Graves' antibodies were not elevated: Lab Results  Component Value Date   TSI <89 09/28/2019   Thyroid ultrasound (03/07/2019): 0.8 cm hypoechoic thyroid nodule, not worrisome. Minimal blood flow. No follow-up needed.  Pt denies: - feeling nodules in neck - hoarseness - dysphagia - choking - SOB with lying down  At last visit, she  described: - + mild fluctuations in weight - + fatigue - occasionally - once a week  - + insomnia - chronic - + excessive sweating/heat intolerance -especially at night, especially on L side - + tremors - + anxiety - + rarely palpitations - no hyperdefecation - + hair loss  At this visit: - + fatigue - + insomnia (stable) - + heat and cold intolerance - + anxiety - no weight changes - no tremors - no palpitations  Pt does not have a FH of thyroid ds. No FH of thyroid cancer. No h/o radiation tx to head or neck.   No seaweed or kelp, no recent contrast studies. No steroid use. + herbal supplements: curcumin. No Biotin use.   Pt. also has a history of BrCA - + RxTx.  ROS: Constitutional: no weight gain/no weight loss, + fatigue, no subjective hyperthermia, no subjective hypothermia Eyes: no blurry vision, no xerophthalmia ENT: no sore throat, + see HPI Cardiovascular: no CP/no SOB/no palpitations/no leg swelling Respiratory: no cough/no SOB/no wheezing Gastrointestinal: no N/no V/no D/no C/no acid reflux Musculoskeletal: + Muscle aches/+ joint aches Skin: no rashes, no hair loss, + itching Neurological: no Tremors/no numbness/no tingling/no dizziness, + headaches  I reviewed pt's medications, allergies, PMH, social hx, family hx, and changes were documented in the history of present illness. Otherwise, unchanged from my initial visit note.  Past Medical History:  Diagnosis Date  . Abnormal TSH   .  Anemia   . Arthritis    "knees, hands" (04/15/2017)  . Breast cancer ([place])   . Chronic knee pain   . Chronic systolic CHF (congestive heart failure) ([place])   . COVID-19   . Does not have health insurance   . Frequent PVCs   . Hyperthyroidism 03/05/2019  . Hyperthyroidism 03/05/2019  . 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 NICM (nonischemic cardiomyopathy) ([place])    a. ? PVC cardiomyopathy. right and left heart catheterization on 04/15/17, no CAD. Fick  output/index 5.28/3.34.  . Noncompliance    a. episodic noncompliance.  . Pneumonia 06/2016   Past Surgical History:  Procedure Laterality Date  . BREAST CYST EXCISION Left   . CARDIOVERSION N/A 03/09/2019   Procedure: CARDIOVERSION;  Surgeon: Jolaine Artist, MD;  Location: [facility] ENDOSCOPY;  Service: Cardiovascular;  Laterality: N/A;  . CARDIOVERSION N/A 07/05/2019   Procedure: CARDIOVERSION;  Surgeon: Thompson Grayer, MD;  Location: [place] CV LAB;  Service: Cardiovascular;  Laterality: N/A;  . CORONARY/GRAFT ANGIOGRAPHY N/A 04/15/2017   Procedure: CORONARY/GRAFT ANGIOGRAPHY;  Surgeon: Nelva Bush, MD;  Location: [place] CV LAB;  Service: Cardiovascular;  Laterality: N/A;  . RIGHT HEART CATH N/A 04/15/2017   Procedure: RIGHT HEART CATH;  Surgeon: Nelva Bush, MD;  Location: [place] CV LAB;  Service: Cardiovascular;  Laterality: N/A;  . TEE WITHOUT CARDIOVERSION N/A 03/09/2019   Procedure: TRANSESOPHAGEAL ECHOCARDIOGRAM (TEE);  Surgeon: Jolaine Artist, MD;  Location: [facility] ENDOSCOPY;  Service: Cardiovascular;  Laterality: N/A;  . TEE WITHOUT CARDIOVERSION N/A 07/05/2019   Procedure: TRANSESOPHAGEAL ECHOCARDIOGRAM (TEE);  Surgeon: Thompson Grayer, MD;  Location: [place] CV LAB;  Service: Cardiovascular;  Laterality: N/A;  . TUBAL LIGATION     Social History   Socioeconomic History  . Marital status: Single    Spouse name: Not on file  . Number of children: 4  . Years of education: Not on file  . Highest education level: Not on file  Occupational History  . Not on file  Tobacco Use  . Smoking status: Never Smoker  . Smokeless tobacco: Never Used  Substance and Sexual Activity  . Alcohol use: Not Currently    Comment: 04/15/2017 "might have a couple beers q couple months"  . Drug use: No  . Sexual activity: Not Currently  Other Topics Concern  . Not on file  Social History Narrative   N/A   Social Determinants of Health   Financial Resource Strain:    . Difficulty of Paying Living Expenses: Not on file  Food Insecurity: Unknown  . Worried About Charity fundraiser in the Last Year: Patient refused  . Ran Out of Food in the Last Year: Patient refused  Transportation Needs: Unknown  . Lack of Transportation (Medical): Patient refused  . Lack of Transportation (Non-Medical): Patient refused  Physical Activity: Unknown  . Days of Exercise per Week: Patient refused  . Minutes of Exercise per Session: Patient refused  Stress: No Stress Concern Present  . Feeling of Stress : Only a little  Social Connections: Unknown  . Frequency of Communication with Friends and Family: Patient refused  . Frequency of Social Gatherings with Friends and Family: Patient refused  . Attends Religious Services: Patient refused  . Active Member of Clubs or Organizations: Patient refused  . Attends Archivist Meetings: Patient refused  . Marital Status: Patient refused  Intimate Partner Violence: Unknown  . Fear of Current or Ex-Partner: Patient refused  . Emotionally Abused:  Patient refused  . Physically Abused: Patient refused  . Sexually Abused: Patient refused   Current Outpatient Medications on File Prior to Visit  Medication Sig Dispense Refill  . acetaminophen (TYLENOL) 650 MG CR tablet Take 650 mg by mouth every 8 (eight) hours as needed for pain.    Marland Kitchen amiodarone (PACERONE) 200 MG tablet Take 1 tablet (200 mg total) by mouth daily. 30 tablet 3  . anastrozole (ARIMIDEX) 1 MG tablet Take 1 mg by mouth daily.    Marland Kitchen apixaban (ELIQUIS) 5 MG TABS tablet Take 1 tablet (5 mg total) by mouth 2 (two) times daily. 60 tablet 11  . Ascorbic Acid (VITAMIN C) 1000 MG tablet Take 1,000 mg by mouth daily.    . carvedilol (COREG) 3.125 MG tablet Take 1 tablet (3.125 mg total) by mouth 2 (two) times daily. 60 tablet 3  . Cyanocobalamin (VITAMIN B 12) 500 MCG TABS Take 1,000 mcg by mouth daily.     . furosemide (LASIX) 40 MG tablet Take 1 tablet (40 mg total)  by mouth daily. 30 tablet 3  . losartan (COZAAR) 25 MG tablet TOMAR 1 TABLETA POR VIA ORAL AL ACOSTARSE (TAKE 1 TABLET BY MOUTH AT BEDTIME) 30 tablet 3  . spironolactone (ALDACTONE) 25 MG tablet Take 0.5 tablets (12.5 mg total) by mouth daily. Please keep upcoming appt for refills. Thank you 30 tablet 0   No current facility-administered medications on file prior to visit.   No Known Allergies Family History  Problem Relation Age of Onset  . Hypertension Mother     PE: BP 108/60   Pulse 85   Ht _0  (1.499 m)   Wt 144 lb (65.3 kg)   SpO2 98%   BMI 29.08 kg/m  Wt Readings from Last 3 Encounters:  12/28/19 144 lb (65.3 kg)  11/05/19 144 lb 3.2 oz (65.4 kg)  10/22/19 141 lb 6.4 oz (64.1 kg)   Constitutional: normal weight, in NAD Eyes: PERRLA, EOMI, no exophthalmos, no lid lag, no stare ENT: moist mucous membranes, no thyromegaly, no cervical lymphadenopathy Cardiovascular: RRR, No MRG Respiratory: CTA B Gastrointestinal: abdomen soft, NT, ND, BS+ Musculoskeletal: no deformities, strength intact in all 4 Skin: moist, warm, no rashes Neurological: no tremor with outstretched hands, DTR normal in all 4  ASSESSMENT: 1. 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 Patient with a chronic history of thyrotoxicosis, with some possible thyrotoxic symptoms: Heat intolerance (although this can be hot flashes), and anxiety, insomnia.  She occasionally also had palpitations, which resolved.  It is possible that her thyrotoxicosis was induced by amiodarone. -We discussed at last visit that AIT type I occurs in patients with underlying thyroid pathology and usually indicates hyperproduction of thyroid hormones, while AIT type II is a consequence of subacute thyroiditis with amiodarone-induced release of preformed thyroid hormones into the circulation.  These entities are treated differently, but occasionally it is difficult to differentiate between the 2 and we treat for both.   Treatment for AIT type I is with methimazole for AIT back to his with prednisone.  Reviewing her previous work-up, her TSI's were not elevated and her blood flow in the thyroid was extremely reduced, pointing towards AIT type II (thyroiditis).  At last visit, however, we rechecked her thyroid tests and these were all normal, so we did not start prednisone.  I advised her to return for recheck in 3 months.  Of note, she continues on amiodarone. -At this visit, she does not have new  thyrotoxic symptoms.  Also, she did not lose weight since last visit, and her heart rate is not high. -We will recheck her TFTs today and if normal, we will see her back in a year, but with labs in 6 months. -We did discuss that she is at risk for both thyrotoxicosis and hypothyroidism after thyrotoxicosis so we will need to continue to follow her.  For results, call granddaughter [name]: [phone]  Component     Latest Ref Rng & Units 12/28/2019  TSH     0.35 - 4.50 uIU/mL 0.15 (L)  Triiodothyronine,Free,Serum     2.3 - 4.2 pg/mL 3.6  T4,Free(Direct)     0.60 - 1.60 ng/dL 1.66 (H)   TSH is slightly low, will free T4 slightly high.  She has very mild thyrotoxicosis, which may resolve by itself, as it did before, but we can try a low-dose prednisone, 5 mg daily for the next 3 to 4 weeks and recheck the tests then.  Philemon Kingdom, MD PhD [facility] Endocrinology

## 2019-12-30 DIAGNOSIS — D72829 Elevated white blood cell count, unspecified: Secondary | ICD-10-CM

## 2019-12-30 DIAGNOSIS — I361 Nonrheumatic tricuspid (valve) insufficiency: Secondary | ICD-10-CM

## 2019-12-30 DIAGNOSIS — I420 Dilated cardiomyopathy: Secondary | ICD-10-CM

## 2019-12-30 DIAGNOSIS — J189 Pneumonia, unspecified organism: Secondary | ICD-10-CM

## 2019-12-30 DIAGNOSIS — R109 Unspecified abdominal pain: Secondary | ICD-10-CM

## 2019-12-30 DIAGNOSIS — I480 Paroxysmal atrial fibrillation: Secondary | ICD-10-CM

## 2019-12-30 DIAGNOSIS — R042 Hemoptysis: Secondary | ICD-10-CM

## 2019-12-30 DIAGNOSIS — I340 Nonrheumatic mitral (valve) insufficiency: Secondary | ICD-10-CM

## 2019-12-31 ENCOUNTER — Telehealth: Payer: Self-pay

## 2019-12-31 NOTE — Telephone Encounter (Signed)
-----   Message from Philemon Kingdom, MD sent at 12/28/2019  6:13 PM EDT ----- Lenna Sciara, can you please call pt's Granddaughter(please see telephone number in my note): TSH is slightly low, will free T4 slightly high.  She has very mild hypothyroidism which may resolve by itself, as it did before, but we can try a low-dose prednisone, 5 mg daily for the next 3 to 4 weeks and recheck the tests then.  I put this on her medication list but did not send it pending your phone call today. It is set on No [employer].  Can you please send this after you talk to them?  Labs are in.

## 2020-01-01 MED ORDER — PREDNISONE 5 MG PO TABS: 5.0000 mg | ORAL_TABLET | Freq: Every day | ORAL | 1 refills | Status: DC

## 2020-01-01 NOTE — Telephone Encounter (Signed)
Notified patient grandaughter of message from Dr. Cruzita Lederer, patient expressed understanding and agreement. No further questions.  RX sent.

## 2020-01-10 ENCOUNTER — Encounter (HOSPITAL_COMMUNITY): Payer: Self-pay | Admitting: Internal Medicine

## 2020-01-10 ENCOUNTER — Observation Stay (HOSPITAL_COMMUNITY)
Admission: RE | Admit: 2020-01-10 | Discharge: 2020-01-10 | Disposition: A | Discharge status: Home / Self Care | Payer: Self-pay | Source: Ambulatory Visit | Attending: Internal Medicine | Admitting: Internal Medicine

## 2020-01-10 ENCOUNTER — Other Ambulatory Visit: Payer: Self-pay

## 2020-01-10 ENCOUNTER — Ambulatory Visit (HOSPITAL_BASED_OUTPATIENT_CLINIC_OR_DEPARTMENT_OTHER)
Admission: RE | Admit: 2020-01-10 | Discharge: 2020-01-10 | Disposition: A | Discharge status: Home / Self Care | Payer: Self-pay | Source: Ambulatory Visit | Attending: Internal Medicine | Admitting: Internal Medicine

## 2020-01-10 VITALS — BP 92/62 | HR 86 | Wt 138.8 lb

## 2020-01-10 DIAGNOSIS — Z79899 Other long term (current) drug therapy: Secondary | ICD-10-CM | POA: Insufficient documentation

## 2020-01-10 DIAGNOSIS — Z7901 Long term (current) use of anticoagulants: Secondary | ICD-10-CM | POA: Insufficient documentation

## 2020-01-10 DIAGNOSIS — E0590 Thyrotoxicosis, unspecified without thyrotoxic crisis or storm: Secondary | ICD-10-CM | POA: Insufficient documentation

## 2020-01-10 DIAGNOSIS — T462X5A Adverse effect of other antidysrhythmic drugs, initial encounter: Secondary | ICD-10-CM | POA: Insufficient documentation

## 2020-01-10 DIAGNOSIS — Z8616 Personal history of COVID-19: Secondary | ICD-10-CM | POA: Insufficient documentation

## 2020-01-10 DIAGNOSIS — I4819 Other persistent atrial fibrillation: Secondary | ICD-10-CM | POA: Insufficient documentation

## 2020-01-10 DIAGNOSIS — Z9114 Patient's other noncompliance with medication regimen: Secondary | ICD-10-CM | POA: Insufficient documentation

## 2020-01-10 DIAGNOSIS — Z853 Personal history of malignant neoplasm of breast: Secondary | ICD-10-CM | POA: Insufficient documentation

## 2020-01-10 DIAGNOSIS — I428 Other cardiomyopathies: Secondary | ICD-10-CM | POA: Insufficient documentation

## 2020-01-10 DIAGNOSIS — Z7952 Long term (current) use of systemic steroids: Secondary | ICD-10-CM | POA: Insufficient documentation

## 2020-01-10 DIAGNOSIS — I480 Paroxysmal atrial fibrillation: Secondary | ICD-10-CM

## 2020-01-10 DIAGNOSIS — I5022 Chronic systolic (congestive) heart failure: Secondary | ICD-10-CM | POA: Insufficient documentation

## 2020-01-10 LAB — BASIC METABOLIC PANEL
Anion gap: 8 (ref 5–15)
BUN: 10 mg/dL (ref 6–20)
CO2: 22 mmol/L (ref 22–32)
Calcium: 9.2 mg/dL (ref 8.9–10.3)
Chloride: 109 mmol/L (ref 98–111)
Creatinine, Ser: 0.71 mg/dL (ref 0.44–1.00)
GFR calc Af Amer: >60 mL/min (ref >60)
GFR calc non Af Amer: >60 mL/min (ref >60)
Glucose, Bld: 114 mg/dL — ABNORMAL HIGH (ref 70–99)
Potassium: 4 mmol/L (ref 3.5–5.1)
Sodium: 139 mmol/L (ref 135–145)

## 2020-01-10 LAB — CBC
HCT: 40 % (ref 36.0–46.0)
Hemoglobin: 12.6 g/dL (ref 12.0–15.0)
MCH: 29.8 pg (ref 26.0–34.0)
MCHC: 31.5 g/dL (ref 30.0–36.0)
MCV: 94.6 fL (ref 80.0–100.0)
Platelets: 358 10*3/uL (ref 150–400)
RBC: 4.23 MIL/uL (ref 3.87–5.11)
RDW: 14.2 % (ref 11.5–15.5)
WBC: 6.8 10*3/uL (ref 4.0–10.5)
nRBC: 0 % (ref 0.0–0.2)

## 2020-01-10 LAB — BRAIN NATRIURETIC PEPTIDE: B Natriuretic Peptide: 685.7 pg/mL — ABNORMAL HIGH (ref 0.0–100.0)

## 2020-01-10 MED ORDER — FUROSEMIDE 40 MG PO TABS: 40.0000 mg | ORAL_TABLET | ORAL | 6 refills | Status: DC

## 2020-01-10 NOTE — Patient Instructions (Addendum)
Take Furosemide 40 mg every Monday, Wednesday and Friday  Labs done today, we will call you for abnormal results  Your physician recommends that you schedule a follow-up appointment in: 2 months  If you have any questions or concerns before your next appointment please send [place] a message through [place] or call our office at [phone].  At the [facility], you and your health needs are our priority. As part of our continuing mission to provide you with exceptional heart care, we have created designated Provider Care Teams. These Care Teams include your primary Cardiologist (physician) and Advanced Practice Providers (APPs- Physician Assistants and Nurse Practitioners) who all work together to provide you with the care you need, when you need it.   You may see any of the following providers on your designated Care Team at your next follow up: Marland Kitchen Dr Glori Bickers . Dr Loralie Champagne . Darrick Grinder, NP . Lyda Jester, PA . Audry Riles, PharmD   Please be sure to bring in all your medications bottles to every appointment.

## 2020-01-10 NOTE — Progress Notes (Signed)
Advanced Heart Failure Clinic Note   Referring Physician: PCP: Kerin Perna, NP PCP-Cardiologist: Glori Bickers, MD  EP: Dr. 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   She presented to [facility] on 04/13/17 with neck pain, SOB and orthopnea. Last EF prior to that reported by Dr. [name] was 35%. Repeat echo showed reduced EF of 15%, she was transferred to [facility] for further evaluation. Underwent right and left heart catheterization on 04/15/17, no CAD. Fick output/index 5.28/3.34. Full results below.   Noted to have >20% PVC on telemetry. She was started on IV Amio and transitioned to po Amio. Also started on digoxin and spiro prior to discharge. BP was too soft to add any other medications. Discharge weight was 143 pounds.   Admitted 7/13-18/2020 with ADHF in setting of new-onset AF in the setting of hyperthyroidism. TEE with EF 10% RV ok. Started on amiodarone and diuresed. Underwent TEE/DC-CV and felt much better. Also treated with methimazole and prednisone.   Seen again 04/2019.Weightwasup a few pounds. Zio patch placed to monitor PVC burden   Zio patch 8/20 - NSR avg HR 75 - 13 runs NSVT longest 14 beats - 59 runs SVT longest 9.4 seconds - PVC burden 4.8%  Admitted10/14-10/17/20 to [facility] for COVID 19 infection c/b gastroenteritis and recurrent afib w/ RVR. Was loaded on IV amiodaroneand IV digoxin and rate improved but did not convert back to NSR. Was placed on Eliquis and discharged home on PO digoxin and metoprolol. Was advised to f/u in [facility] for further management.   Was seen in AHF clinic 07/03/19 for f/u with rapid AF in the 150s. She was volume overloaded. She was directly admitted to [facility] for further management  Was placed  on IV amiodarone for afib. Eliquis resumed. TSH low normal, 0.354. Free T3 WNL 3.7. Free elevated T4 1.35. Methimazole and prednisone continued for hyperthyroidism.  EP was consulted. Initial plans were for PVI ablation but procedure canceled after TEE showed severely reduced EF at 5% and severe LAE. Pt underwent DCCV instead. Converted to sinus tach. After IV amiodarone load, was switched to oral. She improved post conversion as well as w/ diuresis. Was switched from IV to PO Lasix. Co-ox was monitored and remained stable, 60-65%. No IV inotrope requirements. She was also treated w/ Losartan (BP too soft for Entesto), spironolactone and digoxin. Overall not felt to be a great candidate for advanced therapies, pharmacist did thorough home med review and she appears to be quite noncompliant. Discharged 07/10/19.  Saw Dr. Rayann Heman on 10/22/19 and was still in NSR. Discussed possible AF ablation. There was concern over degree of MR and LAE.   Echo 2/1 EF 30-35% moderate MR. Personally reviewed  Return today w/ granddaughter and interpreter for routine HF f/u . She says her breathing is much better. Still occasional mild SOB with exertion. No orthopnea, PND. Says about 2 weeks ago stopped her lasix as she was not swelling. No palpitations No bleeding with Eliquis.   Echo 01/10/20 (today) EF 25-30% Personally reviewed    Cardiac Studies   Echo 03/2017 EF 10-15% Echo 07/2017 EF 25-30%  Echo 11/2017 EF 25-30% Echo 06/2018: EF ~30% (per Dr Haroldine Laws) Echo 8/20 EF 25-30% TEE 11/20 EF 5-10%  R/LHC 8/ 018 LHC -  normal cors  Right Heart RA (mean): 5 mmHg RV (S/EDP): 52/4 mmHg PA (S/D, mean): 52/20 (31) mmHg PCWP (mean): 30 mmHg  Ao sat: 96% PA sat: 71% RA sat: 74%  Fick CO: 5.3 L/min Fick CI: 3.3 L/min/m^2  SVR: 1,101 dynscm-5   Past Medical History:  Diagnosis Date  . Abnormal TSH   . Anemia   . Arthritis    "knees, hands" (04/15/2017)  . Breast cancer ([place])   . Chronic knee pain   .  Chronic systolic CHF (congestive heart failure) ([place])   . COVID-19   . Does not have health insurance   . Frequent PVCs   . Hyperthyroidism 03/05/2019  . Hyperthyroidism 03/05/2019  . 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 NICM (nonischemic cardiomyopathy) ([place])    a. ? PVC cardiomyopathy. right and left heart catheterization on 04/15/17, no CAD. Fick output/index 5.28/3.34.  . Noncompliance    a. episodic noncompliance.  . Pneumonia 06/2016    Current Outpatient Medications  Medication Sig Dispense Refill  . acetaminophen (TYLENOL) 650 MG CR tablet Take 650 mg by mouth every 8 (eight) hours as needed for pain.    Marland Kitchen amiodarone (PACERONE) 200 MG tablet Take 1 tablet (200 mg total) by mouth daily. 30 tablet 3  . anastrozole (ARIMIDEX) 1 MG tablet Take 1 mg by mouth daily.    Marland Kitchen apixaban (ELIQUIS) 5 MG TABS tablet Take 1 tablet (5 mg total) by mouth 2 (two) times daily. 60 tablet 11  . carvedilol (COREG) 3.125 MG tablet Take 1 tablet (3.125 mg total) by mouth 2 (two) times daily. 60 tablet 3  . ciclesonide (ALVESCO) 160 MCG/ACT inhaler Inhale 1 puff into the lungs 2 (two) times daily.    . furosemide (LASIX) 40 MG tablet Take 40 mg by mouth daily as needed for fluid or edema.    Marland Kitchen losartan (COZAAR) 25 MG tablet TOMAR 1 TABLETA POR VIA ORAL AL ACOSTARSE (TAKE 1 TABLET BY MOUTH AT BEDTIME) 30 tablet 3  . Omega-3 Fatty Acids (OMEGA 3 PO) Take 1 tablet by mouth daily.    . pantoprazole (PROTONIX) 40 MG tablet Take 40 mg by mouth daily.    . predniSONE (DELTASONE) 5 MG tablet Take 1 tablet (5 mg total) by mouth daily with breakfast. 28 tablet 1  . Specialty Vitamins Products (ONE-A-DAY BONE STRENGTH PO) Take 1 tablet by mouth daily.     No current facility-administered medications for this encounter.    No Known Allergies    Social History   Socioeconomic History  . Marital status: Single    Spouse name: Not on file  . Number of children: Not on file  . Years of  education: Not on file  . Highest education level: Not on file  Occupational History  . Not on file  Tobacco Use  . Smoking status: Never Smoker  . Smokeless tobacco: Never Used  Substance and Sexual Activity  . Alcohol use: Not Currently    Comment: 04/15/2017 "might have a couple beers q couple months"  . Drug use: No  . Sexual activity: Not Currently  Other Topics Concern  . Not on file  Social History Narrative   N/A   Social Determinants of Health   Financial Resource Strain: High Risk  . Difficulty of Paying Living Expenses: Very hard  Food Insecurity: Unknown  . Worried About Charity fundraiser in the Last Year: Patient refused  . Ran Out of Food in the Last Year:  Patient refused  Transportation Needs: Unknown  . Lack of Transportation (Medical): Patient refused  . Lack of Transportation (Non-Medical): Patient refused  Physical Activity: Unknown  . Days of Exercise per Week: Patient refused  . Minutes of Exercise per Session: Patient refused  Stress: No Stress Concern Present  . Feeling of Stress : Only a little  Social Connections: Unknown  . Frequency of Communication with Friends and Family: Patient refused  . Frequency of Social Gatherings with Friends and Family: Patient refused  . Attends Religious Services: Patient refused  . Active Member of Clubs or Organizations: Patient refused  . Attends Archivist Meetings: Patient refused  . Marital Status: Patient refused  Intimate Partner Violence: Unknown  . Fear of Current or Ex-Partner: Patient refused  . Emotionally Abused: Patient refused  . Physically Abused: Patient refused  . Sexually Abused: Patient refused      Family History  Problem Relation Age of Onset  . Hypertension Mother     Vitals:   01/10/20 1224  BP: 92/62  Pulse: 86  SpO2: 100%  Weight: 63 kg (138 lb 12.8 oz)     PHYSICAL EXAM: General:  Well appearing. No resp difficulty HEENT: normal Neck: supple. no JVD. Carotids  2+ bilat; no bruits. No lymphadenopathy or thryomegaly appreciated. Cor: PMI nondisplaced. Regular rate & rhythm. Soft TR murmur  Lungs: clear Abdomen: soft, nontender, nondistended. No hepatosplenomegaly. No bruits or masses. Good bowel sounds. Extremities: no cyanosis, clubbing, rash, tr edema Neuro: alert & orientedx3, cranial nerves grossly intact. moves all 4 extremities w/o difficulty. Affect pleasant  ECG: NSR 74 No ST-T wave abnormalities.  Personally reviewed   ASSESSMENT & PLAN:  1. Atrial Fibrillation, persistent:  - s/p previous DC-CV in 7/20. 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  Previously was on amio for PVCs but developed hyperthyroidism, now being treated w/ methimazole. Admitted 06/2019 for persistent afib w/ RVR + a/c systolic CHF. Loaded w/ IV amiodarone and evaluated by EP. Initial plans were for PVI ablation but procedure canceled after TEE showed severely reduced EF at 5% and severe LAE. Pt underwent DCCV instead.  - Remains in NSR today.  - Continue amio.  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 Denies abnormal bleeding.  - Has seen Dr. Rayann Heman in 2/21. Planned for AF ablation  - Unfortunately does not have insurance coverage for AF ablation.Continue medical treatment - Check amio labs  2.Chronic systolic [id number], normal cors in 03/2017. 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 03/2017 EF 15-20% Echo 07/28/17 EF 25-30%  - Echo 06/2018 EF ~30% - TEE 11/20 EF 5% in setting of AF - Echo 2/21 EF 30-35% - Stable NYHA II. Volume status ok  - Echo today EF 25-30% - Continue carvedilol 3.125 bid - Losartan 25 daily - Off digoxin due to intolerance - BP too low to titrate  3. 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 Free T3 WNL 3.7. Free elevated T4 1.35 - Off methimazole  - Continue amio despite amio-induced hyperthyroidism as it is our only option. - Follows with Dr.  Monna Fam  4.H/oFrequent PVCs - Zio patch8/20with 5% PVC burden  - Amio as above  5. Left Breast Cancer. -treated w/XRT  6. Medication noncompliance -seems a bit better. language barrier plays a role -unfortunately, unable to arrange paramedicine as she lives outside of [place], MD 01/10/20

## 2020-01-10 NOTE — Progress Notes (Signed)
ReDS Vest / Clip - 01/10/20 1200      ReDS Vest / Clip   Station Marker  A    Ruler Value  33    ReDS Value Range  (!) High volume overload    ReDS Actual Value  45    Anatomical Comments  sitting

## 2020-01-10 NOTE — Progress Notes (Signed)
  Echocardiogram 2D Echocardiogram has been performed.  Megan Keith 01/10/2020, 11:33 AM

## 2020-01-31 ENCOUNTER — Emergency Department (HOSPITAL_COMMUNITY): Payer: Self-pay

## 2020-01-31 ENCOUNTER — Other Ambulatory Visit: Payer: Self-pay

## 2020-01-31 ENCOUNTER — Encounter (HOSPITAL_COMMUNITY): Payer: Self-pay | Admitting: *Deleted

## 2020-01-31 ENCOUNTER — Emergency Department (HOSPITAL_COMMUNITY): Admission: EM | Admit: 2020-01-31 | Payer: Self-pay | Source: Home / Self Care

## 2020-01-31 ENCOUNTER — Encounter (HOSPITAL_COMMUNITY): Payer: Self-pay | Admitting: Internal Medicine

## 2020-01-31 ENCOUNTER — Emergency Department (HOSPITAL_COMMUNITY)
Admission: EM | Admit: 2020-01-31 | Discharge: 2020-01-31 | Disposition: A | Discharge status: Home / Self Care | Payer: Self-pay | Attending: Emergency Medicine | Admitting: Emergency Medicine

## 2020-01-31 DIAGNOSIS — Z7901 Long term (current) use of anticoagulants: Secondary | ICD-10-CM | POA: Insufficient documentation

## 2020-01-31 DIAGNOSIS — I509 Heart failure, unspecified: Secondary | ICD-10-CM | POA: Insufficient documentation

## 2020-01-31 DIAGNOSIS — W3400XA Accidental discharge from unspecified firearms or gun, initial encounter: Secondary | ICD-10-CM

## 2020-01-31 DIAGNOSIS — Y999 Unspecified external cause status: Secondary | ICD-10-CM | POA: Insufficient documentation

## 2020-01-31 DIAGNOSIS — Z23 Encounter for immunization: Secondary | ICD-10-CM | POA: Insufficient documentation

## 2020-01-31 DIAGNOSIS — I4891 Unspecified atrial fibrillation: Secondary | ICD-10-CM | POA: Insufficient documentation

## 2020-01-31 DIAGNOSIS — Z79899 Other long term (current) drug therapy: Secondary | ICD-10-CM | POA: Insufficient documentation

## 2020-01-31 DIAGNOSIS — Y939 Activity, unspecified: Secondary | ICD-10-CM | POA: Insufficient documentation

## 2020-01-31 DIAGNOSIS — Y92019 Unspecified place in single-family (private) house as the place of occurrence of the external cause: Secondary | ICD-10-CM | POA: Insufficient documentation

## 2020-01-31 DIAGNOSIS — S81832A Puncture wound without foreign body, left lower leg, initial encounter: Secondary | ICD-10-CM | POA: Insufficient documentation

## 2020-01-31 HISTORY — DX: Unspecified atrial fibrillation: I48.91

## 2020-01-31 HISTORY — DX: Heart failure, unspecified: I50.9

## 2020-01-31 LAB — COMPREHENSIVE METABOLIC PANEL
ALT: 14 U/L (ref 0–44)
AST: 26 U/L (ref 15–41)
Albumin: 3.3 g/dL — ABNORMAL LOW (ref 3.5–5.0)
Alkaline Phosphatase: 73 U/L (ref 38–126)
Anion gap: 8 (ref 5–15)
BUN: 12 mg/dL (ref 6–20)
CO2: 19 mmol/L — ABNORMAL LOW (ref 22–32)
Calcium: 8 mg/dL — ABNORMAL LOW (ref 8.9–10.3)
Chloride: 113 mmol/L — ABNORMAL HIGH (ref 98–111)
Creatinine, Ser: 0.87 mg/dL (ref 0.44–1.00)
GFR calc Af Amer: >60 mL/min (ref >60)
GFR calc non Af Amer: >60 mL/min (ref >60)
Glucose, Bld: 128 mg/dL — ABNORMAL HIGH (ref 70–99)
Potassium: 4.2 mmol/L (ref 3.5–5.1)
Sodium: 140 mmol/L (ref 135–145)
Total Bilirubin: 1.1 mg/dL (ref 0.3–1.2)
Total Protein: 5.5 g/dL — ABNORMAL LOW (ref 6.5–8.1)

## 2020-01-31 LAB — I-STAT CHEM 8, ED
BUN: 16 mg/dL (ref 6–20)
Calcium, Ion: 1.03 mmol/L — ABNORMAL LOW (ref 1.15–1.40)
Chloride: 110 mmol/L (ref 98–111)
Creatinine, Ser: 0.8 mg/dL (ref 0.44–1.00)
Glucose, Bld: 126 mg/dL — ABNORMAL HIGH (ref 70–99)
HCT: 36 % (ref 36.0–46.0)
Hemoglobin: 12.2 g/dL (ref 12.0–15.0)
Potassium: 4 mmol/L (ref 3.5–5.1)
Sodium: 141 mmol/L (ref 135–145)
TCO2: 21 mmol/L — ABNORMAL LOW (ref 22–32)

## 2020-01-31 LAB — ETHANOL: Alcohol, Ethyl (B): <10 mg/dL (ref <10)

## 2020-01-31 LAB — CBC
HCT: 36.6 % (ref 36.0–46.0)
Hemoglobin: 11.7 g/dL — ABNORMAL LOW (ref 12.0–15.0)
MCH: 29.4 pg (ref 26.0–34.0)
MCHC: 32 g/dL (ref 30.0–36.0)
MCV: 92 fL (ref 80.0–100.0)
Platelets: 277 10*3/uL (ref 150–400)
RBC: 3.98 MIL/uL (ref 3.87–5.11)
RDW: 14.6 % (ref 11.5–15.5)
WBC: 10.5 10*3/uL (ref 4.0–10.5)
nRBC: 0 % (ref 0.0–0.2)

## 2020-01-31 LAB — SAMPLE TO BLOOD BANK

## 2020-01-31 LAB — LACTIC ACID, PLASMA: Lactic Acid, Venous: 1.9 mmol/L (ref 0.5–1.9)

## 2020-01-31 LAB — PROTIME-INR
INR: 1.4 — ABNORMAL HIGH (ref 0.8–1.2)
Prothrombin Time: 16.4 seconds — ABNORMAL HIGH (ref 11.4–15.2)

## 2020-01-31 MED ORDER — IOHEXOL 350 MG/ML SOLN
125.0000 mL | Freq: Once | INTRAVENOUS | Status: AC | PRN
  Administered 2020-01-31: 125 mL via INTRAVENOUS

## 2020-01-31 MED ORDER — TETANUS-DIPHTH-ACELL PERTUSSIS 5-2.5-18.5 LF-MCG/0.5 IM SUSP
0.5000 mL | Freq: Once | INTRAMUSCULAR | Status: AC
  Administered 2020-01-31: 0.5 mL via INTRAMUSCULAR
  Filled 2020-01-31: qty 0.5
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
## 2020-01-31 NOTE — Progress Notes (Signed)
Orthopedic Tech Progress Note Patient Details:  Megan Keith 08/23/1898 [id number] Level 2 Trauma  Patient ID: [name], female   DOB: 08/23/1898, 59 y.o.   MRN: [id number]   Jearld Lesch 01/31/2020, 2:39 AM
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
## 2020-01-31 NOTE — ED Notes (Signed)
Patient transported to CT 

## 2020-01-31 NOTE — ED Notes (Signed)
Pt removed from bed pan, had very runny BM.   Pt cleaned as best as possible with limited mobility, placed on chux, and then back into position of comfort. Family member at bedside stepped to lobby at this time.
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
## 2020-01-31 NOTE — ED Notes (Signed)
EDP at bedside  
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
## 2020-02-01 ENCOUNTER — Other Ambulatory Visit (HOSPITAL_COMMUNITY): Payer: Self-pay | Admitting: Internal Medicine

## 2020-02-15 ENCOUNTER — Ambulatory Visit (INDEPENDENT_AMBULATORY_CARE_PROVIDER_SITE_OTHER): Payer: Self-pay | Admitting: Primary Care

## 2020-02-27 ENCOUNTER — Other Ambulatory Visit: Payer: Self-pay | Admitting: Internal Medicine

## 2020-03-10 ENCOUNTER — Other Ambulatory Visit (HOSPITAL_COMMUNITY): Payer: Self-pay | Admitting: Internal Medicine

## 2020-03-20 ENCOUNTER — Encounter (HOSPITAL_COMMUNITY): Payer: Self-pay

## 2020-04-07 ENCOUNTER — Encounter (HOSPITAL_COMMUNITY): Payer: Self-pay | Admitting: Internal Medicine

## 2020-04-07 ENCOUNTER — Ambulatory Visit (HOSPITAL_COMMUNITY)
Admission: RE | Admit: 2020-04-07 | Discharge: 2020-04-07 | Disposition: A | Discharge status: Home / Self Care | Payer: Self-pay | Source: Ambulatory Visit | Attending: Internal Medicine | Admitting: Internal Medicine

## 2020-04-07 ENCOUNTER — Other Ambulatory Visit: Payer: Self-pay

## 2020-04-07 VITALS — BP 106/66 | HR 78 | Ht 61.0 in | Wt 145.0 lb

## 2020-04-07 DIAGNOSIS — Z853 Personal history of malignant neoplasm of breast: Secondary | ICD-10-CM | POA: Insufficient documentation

## 2020-04-07 DIAGNOSIS — Z7901 Long term (current) use of anticoagulants: Secondary | ICD-10-CM | POA: Insufficient documentation

## 2020-04-07 DIAGNOSIS — E0590 Thyrotoxicosis, unspecified without thyrotoxic crisis or storm: Secondary | ICD-10-CM | POA: Insufficient documentation

## 2020-04-07 DIAGNOSIS — E0580 Other thyrotoxicosis without thyrotoxic crisis or storm: Secondary | ICD-10-CM

## 2020-04-07 DIAGNOSIS — I480 Paroxysmal atrial fibrillation: Secondary | ICD-10-CM

## 2020-04-07 DIAGNOSIS — I4819 Other persistent atrial fibrillation: Secondary | ICD-10-CM | POA: Insufficient documentation

## 2020-04-07 DIAGNOSIS — Z923 Personal history of irradiation: Secondary | ICD-10-CM | POA: Insufficient documentation

## 2020-04-07 DIAGNOSIS — I428 Other cardiomyopathies: Secondary | ICD-10-CM | POA: Insufficient documentation

## 2020-04-07 DIAGNOSIS — Z8249 Family history of ischemic heart disease and other diseases of the circulatory system: Secondary | ICD-10-CM | POA: Insufficient documentation

## 2020-04-07 DIAGNOSIS — I5022 Chronic systolic (congestive) heart failure: Secondary | ICD-10-CM

## 2020-04-07 DIAGNOSIS — I493 Ventricular premature depolarization: Secondary | ICD-10-CM | POA: Insufficient documentation

## 2020-04-07 DIAGNOSIS — Z79899 Other long term (current) drug therapy: Secondary | ICD-10-CM | POA: Insufficient documentation

## 2020-04-07 DIAGNOSIS — Z9114 Patient's other noncompliance with medication regimen: Secondary | ICD-10-CM | POA: Insufficient documentation

## 2020-04-07 DIAGNOSIS — Z7951 Long term (current) use of inhaled steroids: Secondary | ICD-10-CM | POA: Insufficient documentation

## 2020-04-07 DIAGNOSIS — Z8616 Personal history of COVID-19: Secondary | ICD-10-CM | POA: Insufficient documentation

## 2020-04-07 DIAGNOSIS — R9431 Abnormal electrocardiogram [ECG] [EKG]: Secondary | ICD-10-CM | POA: Insufficient documentation

## 2020-04-07 DIAGNOSIS — M1990 Unspecified osteoarthritis, unspecified site: Secondary | ICD-10-CM | POA: Insufficient documentation

## 2020-04-07 LAB — COMPREHENSIVE METABOLIC PANEL
ALT: 17 U/L (ref 0–44)
AST: 22 U/L (ref 15–41)
Albumin: 3.8 g/dL (ref 3.5–5.0)
Alkaline Phosphatase: 75 U/L (ref 38–126)
Anion gap: 5 (ref 5–15)
BUN: 13 mg/dL (ref 6–20)
CO2: 24 mmol/L (ref 22–32)
Calcium: 8.8 mg/dL — ABNORMAL LOW (ref 8.9–10.3)
Chloride: 109 mmol/L (ref 98–111)
Creatinine, Ser: 0.8 mg/dL (ref 0.44–1.00)
GFR calc Af Amer: >60 mL/min (ref >60)
GFR calc non Af Amer: >60 mL/min (ref >60)
Glucose, Bld: 79 mg/dL (ref 70–99)
Potassium: 4.2 mmol/L (ref 3.5–5.1)
Sodium: 138 mmol/L (ref 135–145)
Total Bilirubin: 0.6 mg/dL (ref 0.3–1.2)
Total Protein: 6.6 g/dL (ref 6.5–8.1)

## 2020-04-07 LAB — TSH: TSH: 0.329 u[IU]/mL — ABNORMAL LOW (ref 0.350–4.500)

## 2020-04-07 LAB — BRAIN NATRIURETIC PEPTIDE: B Natriuretic Peptide: 549.1 pg/mL — ABNORMAL HIGH (ref 0.0–100.0)

## 2020-04-07 MED ORDER — LOSARTAN POTASSIUM 50 MG PO TABS: 50.0000 mg | ORAL_TABLET | Freq: Every day | ORAL | 6 refills | Status: DC

## 2020-04-07 NOTE — Patient Instructions (Signed)
INCREASE Losartan 50mg  Daily   Follow up with our office in 3 months for your follow up appointment.   Your physician has requested that you have an echocardiogram. Echocardiography is a painless test that uses sound waves to create images of your heart. It provides your doctor with information about the size and shape of your heart and how well your heart's chambers and valves are working. This procedure takes approximately one hour. There are no restrictions for this procedure.    Labs done today, your results will be available in MyChart, we will contact you for abnormal readings.   If you have any questions or concerns before your next appointment please send [place] a message through [place] or call our office at [phone].    TO LEAVE A MESSAGE FOR THE NURSE SELECT OPTION 2, PLEASE LEAVE A MESSAGE INCLUDING: . YOUR NAME . DATE OF BIRTH . CALL BACK NUMBER . REASON FOR CALL**this is important as we prioritize the call backs  [place] AS LONG AS YOU CALL BEFORE 4:00 PM  At the [facility], you and your health needs are our priority. As part of our continuing mission to provide you with exceptional heart care, we have created designated Provider Care Teams. These Care Teams include your primary Cardiologist (physician) and Advanced Practice Providers (APPs- Physician Assistants and Nurse Practitioners) who all work together to provide you with the care you need, when you need it.   You may see any of the following providers on your designated Care Team at your next follow up: Marland Kitchen Dr Glori Bickers . Dr Loralie Champagne . Darrick Grinder, NP . Lyda Jester, PA . Audry Riles, PharmD   Please be sure to bring in all your medications bottles to every appointment.

## 2020-04-07 NOTE — Progress Notes (Signed)
Advanced Heart Failure Clinic Note   Referring Physician: PCP: Kerin Perna, NP PCP-Cardiologist: Glori Bickers, MD  EP: Dr. 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   She presented to [facility] on 04/13/17 with neck pain, SOB and orthopnea. Last EF prior to that reported by Dr. [name] was 35%. Repeat echo showed reduced EF of 15%, she was transferred to [facility] for further evaluation. Underwent right and left heart catheterization on 04/15/17, no CAD. Fick output/index 5.28/3.34. Full results below.   Noted to have >20% PVC on telemetry. She was started on IV Amio and transitioned to po Amio. Also started on digoxin and spiro prior to discharge. BP was too soft to add any other medications. Discharge weight was 143 pounds.   Admitted 7/13-18/2020 with ADHF in setting of new-onset AF in the setting of hyperthyroidism. TEE with EF 10% RV ok. Started on amiodarone and diuresed. Underwent TEE/DC-CV and felt much better. Also treated with methimazole and prednisone.   Seen again 04/2019.Weightwasup a few pounds. Zio patch placed to monitor PVC burden   Zio patch 8/20 - NSR avg HR 75 - 13 runs NSVT longest 14 beats - 59 runs SVT longest 9.4 seconds - PVC burden 4.8%  Admitted10/14-10/17/20 to [place] for COVID 19 infection c/b gastroenteritis and recurrent afib w/ RVR. Was loaded on IV amiodaroneand IV digoxin and rate improved but did not convert back to NSR. Was placed on Eliquis and discharged home on PO digoxin and metoprolol. Was advised to f/u in [facility] for further management.   Was seen in AHF clinic 07/03/19 for f/u with rapid AF in the 150s. She was volume overloaded. She was directly admitted to [facility] for further management  Was placed  on IV amiodarone for afib. Eliquis resumed. TSH low normal, 0.354. Free T3 WNL 3.7. Free elevated T4 1.35. Methimazole and prednisone continued for hyperthyroidism.  EP was consulted. Initial plans were for PVI ablation but procedure canceled after TEE showed severely reduced EF at 5% and severe LAE. Pt underwent DCCV instead. Converted to sinus tach. After IV amiodarone load, was switched to oral. She improved post conversion as well as w/ diuresis. Was switched from IV to PO Lasix. Co-ox was monitored and remained stable, 60-65%. No IV inotrope requirements. She was also treated w/ Losartan (BP too soft for Entesto), spironolactone and digoxin. Overall not felt to be a great candidate for advanced therapies, pharmacist did thorough home med review and she appears to be quite noncompliant. Discharged 07/10/19.  Saw Dr. Rayann Heman on 10/22/19 and was still in NSR. Discussed possible AF ablation. There was concern over degree of MR and LAE.   Echo 2/1 EF 30-35% moderate MR. Personally reviewed  Return today w/ granddaughter and interpreter for routine HF f/u. Says she feels much better. Denies SOB, orthopnea or PND. Has finished treatment for hyperthyroidism with Dr. [name] 1 month ago.   Echo 01/10/20 EF 25-30% Personally reviewed    Cardiac Studies   Echo 03/2017 EF 10-15% Echo 07/2017 EF 25-30%  Echo 11/2017 EF 25-30% Echo 06/2018: EF ~30% (per Dr Haroldine Laws) Echo 8/20 EF 25-30% TEE 11/20 EF 5-10%  R/LHC 8/ 018 LHC - normal cors  Right Heart RA (mean): 5 mmHg RV (S/EDP): 52/4 mmHg PA (S/D, mean):  52/20 (31) mmHg PCWP (mean): 30 mmHg  Ao sat: 96% PA sat: 71% RA sat: 74%  Fick CO: 5.3 L/min Fick CI: 3.3 L/min/m^2  SVR: 1,101 dynscm-5   Past Medical History:  Diagnosis Date  . A-fib ([place])   . Abnormal TSH   . Anemia   . Arthritis    "knees, hands" (04/15/2017)  . Breast cancer ([place])   . CHF (congestive heart failure) ([place])   . Chronic knee pain   . Chronic systolic CHF  (congestive heart failure) ([place])   . COVID-19   . Does not have health insurance   . Frequent PVCs   . Hyperthyroidism 03/05/2019  . Hyperthyroidism 03/05/2019  . 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 NICM (nonischemic cardiomyopathy) ([place])    a. ? PVC cardiomyopathy. right and left heart catheterization on 04/15/17, no CAD. Fick output/index 5.28/3.34.  . Noncompliance    a. episodic noncompliance.  . Pneumonia 06/2016    Current Outpatient Medications  Medication Sig Dispense Refill  . albuterol (VENTOLIN HFA) 108 (90 Base) MCG/ACT inhaler Inhale 2 puffs into the lungs every 4 (four) hours as needed for wheezing or shortness of breath.     Marland Kitchen amiodarone (PACERONE) 200 MG tablet TOMAR 1 TABLETA DIARIA (TAKE ONE TABLET EVERY DAY) 90 tablet 3  . anastrozole (ARIMIDEX) 1 MG tablet Take 1 mg by mouth daily.    Marland Kitchen apixaban (ELIQUIS) 5 MG TABS tablet Take 1 tablet (5 mg total) by mouth 2 (two) times daily. 60 tablet 11  . carvedilol (COREG) 3.125 MG tablet TOMAR 1 TABLETA 2 VECES AL DIA (TAKE 1 TABLET 2 TIMES A DAY) 60 tablet 3  . ciclesonide (ALVESCO) 160 MCG/ACT inhaler Inhale 1 puff into the lungs 2 (two) times daily.    . furosemide (LASIX) 40 MG tablet Take 1 tablet (40 mg total) by mouth 3 (three) times a week. Every Mon, Wed and Friday 15 tablet 6  . losartan (COZAAR) 25 MG tablet TOMAR 1 TABLETA POR VIA ORAL AL ACOSTARSE (TAKE 1 TABLET BY MOUTH AT BEDTIME) 30 tablet 3  . Omega-3 Fatty Acids (OMEGA 3 PO) Take 1 tablet by mouth daily.    . pantoprazole (PROTONIX) 40 MG tablet Take 40 mg by mouth daily.    Marland Kitchen Specialty Vitamins Products (ONE-A-DAY BONE STRENGTH PO) Take 1 tablet by mouth daily.     No current facility-administered medications for this encounter.    No Known Allergies    Social History   Socioeconomic History  . Marital status: Single    Spouse name: Not on file  . Number of children: Not on file  . Years of education: Not on file  . Highest  education level: Not on file  Occupational History  . Not on file  Tobacco Use  . Smoking status: Never Smoker  . Smokeless tobacco: Never Used  Vaping Use  . Vaping Use: Never used  Substance and Sexual Activity  . Alcohol use: Not Currently    Comment: 04/15/2017 "might have a couple beers q couple months"  . Drug use: Not Currently  . Sexual activity: Not Currently  Other Topics Concern  . Not on file  Social History Narrative   ** Merged History Encounter **       N/A   Social Determinants of Health   Financial Resource Strain: High Risk  . Difficulty of Paying Living Expenses: Very hard  Food Insecurity: Unknown  . Worried About Charity fundraiser in the  Last Year: Patient refused  . Ran Out of Food in the Last Year: Patient refused  Transportation Needs: Unknown  . Lack of Transportation (Medical): Patient refused  . Lack of Transportation (Non-Medical): Patient refused  Physical Activity: Unknown  . Days of Exercise per Week: Patient refused  . Minutes of Exercise per Session: Patient refused  Stress: No Stress Concern Present  . Feeling of Stress : Only a little  Social Connections: Unknown  . Frequency of Communication with Friends and Family: Patient refused  . Frequency of Social Gatherings with Friends and Family: Patient refused  . Attends Religious Services: Patient refused  . Active Member of Clubs or Organizations: Patient refused  . Attends Archivist Meetings: Patient refused  . Marital Status: Patient refused  Intimate Partner Violence: Unknown  . Fear of Current or Ex-Partner: Patient refused  . Emotionally Abused: Patient refused  . Physically Abused: Patient refused  . Sexually Abused: Patient refused      Family History  Problem Relation Age of Onset  . Hypertension Mother     Vitals:   04/07/20 1024  BP: 106/66  Pulse: 78  SpO2: 100%  Weight: 65.8 kg (145 lb)  Height: 5\' 1"  (1.549 m)     PHYSICAL EXAM: General:  Well  appearing. No resp difficulty HEENT: normal Neck: supple. no JVD. Carotids 2+ bilat; no bruits. No lymphadenopathy or thryomegaly appreciated. Cor: PMI nondisplaced. Regular rate & rhythm. No rubs, gallops or murmurs. Lungs: clear Abdomen: soft, nontender, nondistended. No hepatosplenomegaly. No bruits or masses. Good bowel sounds. Extremities: no cyanosis, clubbing, rash, edema Neuro: alert & orientedx3, cranial nerves grossly intact. moves all 4 extremities w/o difficulty. Affect pleasant  ECG: NSR 65 1 PVC No ST-T wave abnormalities.  Personally reviewed   ASSESSMENT & PLAN:  1. Atrial Fibrillation, persistent:  - s/p previous DC-CV in 7/20. 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  Previously was on amio for PVCs but developed hyperthyroidism, now being treated w/ methimazole. Admitted 06/2019 for persistent afib w/ RVR + a/c systolic CHF. Loaded w/ IV amiodarone and evaluated by EP. Initial plans were for PVI ablation but procedure canceled after TEE showed severely reduced EF at 5% and severe LAE. Pt underwent DCCV instead.  - Remains in NSR today.  - Continue amio.  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 Denies abnormal bleeding.  - Has seen Dr. Rayann Heman in 2/21. Planned for AF ablation  - Unfortunately does not have insurance coverage for AF ablation.Continue medical treatment - Check amio labs today - If EF improves can consider switching amio to flecainide down the road.   2.Chronic systolic ERX:VQMG, normal cors in 03/2017. 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 03/2017 EF 15-20% Echo 07/28/17 EF 25-30%  - Echo 06/2018 EF ~30% - TEE 11/20 EF 5% in setting of AF - Echo 2/21 EF 30-35% - Stable NYHA II. Volume status ok  - Echo 5/21 EF 25-30% - Continue carvedilol 3.125 bid - Increase losartan to 50 daily  - Off digoxin due to intolerance - Continue lasix 40 MWF - Ideally would like yo add spiro and SGLT2i soon but I am concerned about  med compliance and cost for her. Will increase losartan.  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 Free T3 WNL 3.7. Free elevated T4 1.35 -  Off methimazole  - Continue amio despite amio-induced hyperthyroidism as it is our only option. - Follows with Dr. Monna Fam - Check labs today  4.H/oFrequent PVCs - Zio patch8/20with 5% PVC burden  - Amio as above  5. Left Breast Cancer. -treated w/XRT  6. Medication noncompliance -seems a bit better. language barrier plays a role -unfortunately, unable to arrange paramedicine as she lives outside of [place], MD 04/07/20

## 2020-04-08 LAB — T4: T4, Total: 9.8 ug/dL (ref 4.5–12.0)

## 2020-04-08 LAB — T3: T3, Total: 80 ng/dL (ref 71–180)

## 2020-05-01 DIAGNOSIS — C50812 Malignant neoplasm of overlapping sites of left female breast: Secondary | ICD-10-CM

## 2020-05-15 ENCOUNTER — Other Ambulatory Visit (HOSPITAL_COMMUNITY): Payer: Self-pay

## 2020-05-15 ENCOUNTER — Telehealth (HOSPITAL_COMMUNITY): Payer: Self-pay | Admitting: Vascular Surgery

## 2020-05-15 NOTE — Telephone Encounter (Signed)
PT granddaughter  called to get appt w/ db in 1 week, pt was in hosp she was taken off several medication per granddaughter now she is having fluid.. please advise # for granddaughter [phone]

## 2020-05-19 NOTE — Telephone Encounter (Signed)
Spoke with pts granddaughter and scheduled appt with APP.

## 2020-05-20 ENCOUNTER — Other Ambulatory Visit: Payer: Self-pay

## 2020-05-20 ENCOUNTER — Ambulatory Visit (HOSPITAL_COMMUNITY)
Admission: RE | Admit: 2020-05-20 | Discharge: 2020-05-20 | Disposition: A | Discharge status: Home / Self Care | Payer: Self-pay | Source: Ambulatory Visit | Attending: Cardiology | Admitting: Cardiology

## 2020-05-20 VITALS — BP 100/60 | HR 90 | Wt 142.0 lb

## 2020-05-20 DIAGNOSIS — Z8616 Personal history of COVID-19: Secondary | ICD-10-CM | POA: Insufficient documentation

## 2020-05-20 DIAGNOSIS — E0590 Thyrotoxicosis, unspecified without thyrotoxic crisis or storm: Secondary | ICD-10-CM | POA: Insufficient documentation

## 2020-05-20 DIAGNOSIS — I5023 Acute on chronic systolic (congestive) heart failure: Secondary | ICD-10-CM | POA: Insufficient documentation

## 2020-05-20 DIAGNOSIS — Z79899 Other long term (current) drug therapy: Secondary | ICD-10-CM | POA: Insufficient documentation

## 2020-05-20 DIAGNOSIS — Z853 Personal history of malignant neoplasm of breast: Secondary | ICD-10-CM | POA: Insufficient documentation

## 2020-05-20 DIAGNOSIS — I5022 Chronic systolic (congestive) heart failure: Secondary | ICD-10-CM

## 2020-05-20 DIAGNOSIS — Z7951 Long term (current) use of inhaled steroids: Secondary | ICD-10-CM | POA: Insufficient documentation

## 2020-05-20 DIAGNOSIS — Z8249 Family history of ischemic heart disease and other diseases of the circulatory system: Secondary | ICD-10-CM | POA: Insufficient documentation

## 2020-05-20 DIAGNOSIS — I428 Other cardiomyopathies: Secondary | ICD-10-CM | POA: Insufficient documentation

## 2020-05-20 DIAGNOSIS — I493 Ventricular premature depolarization: Secondary | ICD-10-CM

## 2020-05-20 DIAGNOSIS — I4819 Other persistent atrial fibrillation: Secondary | ICD-10-CM | POA: Insufficient documentation

## 2020-05-20 DIAGNOSIS — Z9114 Patient's other noncompliance with medication regimen: Secondary | ICD-10-CM | POA: Insufficient documentation

## 2020-05-20 DIAGNOSIS — Z7901 Long term (current) use of anticoagulants: Secondary | ICD-10-CM | POA: Insufficient documentation

## 2020-05-20 LAB — BASIC METABOLIC PANEL
Anion gap: 8 (ref 5–15)
BUN: 10 mg/dL (ref 6–20)
CO2: 22 mmol/L (ref 22–32)
Calcium: 9.3 mg/dL (ref 8.9–10.3)
Chloride: 107 mmol/L (ref 98–111)
Creatinine, Ser: 0.82 mg/dL (ref 0.44–1.00)
GFR calc Af Amer: >60 mL/min (ref >60)
GFR calc non Af Amer: >60 mL/min (ref >60)
Glucose, Bld: 106 mg/dL — ABNORMAL HIGH (ref 70–99)
Potassium: 4.3 mmol/L (ref 3.5–5.1)
Sodium: 137 mmol/L (ref 135–145)

## 2020-05-20 LAB — CBC
HCT: 38.2 % (ref 36.0–46.0)
Hemoglobin: 12.4 g/dL (ref 12.0–15.0)
MCH: 30.3 pg (ref 26.0–34.0)
MCHC: 32.5 g/dL (ref 30.0–36.0)
MCV: 93.4 fL (ref 80.0–100.0)
Platelets: 311 10*3/uL (ref 150–400)
RBC: 4.09 MIL/uL (ref 3.87–5.11)
RDW: 16 % — ABNORMAL HIGH (ref 11.5–15.5)
WBC: 8.4 10*3/uL (ref 4.0–10.5)
nRBC: 0 % (ref 0.0–0.2)

## 2020-05-20 LAB — BRAIN NATRIURETIC PEPTIDE: B Natriuretic Peptide: 403.5 pg/mL — ABNORMAL HIGH (ref 0.0–100.0)

## 2020-05-20 MED ORDER — FUROSEMIDE 40 MG PO TABS: 40.0000 mg | ORAL_TABLET | Freq: Every day | ORAL | 6 refills | Status: DC

## 2020-05-20 NOTE — Progress Notes (Signed)
Advanced Heart Failure Clinic Note   Referring Physician: PCP: Kerin Perna, NP PCP-Cardiologist: Glori Bickers, MD  EP: Dr. 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   She presented to [facility] on 04/13/17 with neck pain, SOB and orthopnea. Last EF prior to that reported by Dr. [name] was 35%. Repeat echo showed reduced EF of 15%, she was transferred to [facility] for further evaluation. Underwent right and left heart catheterization on 04/15/17, no CAD. Fick output/index 5.28/3.34. Full results below.   Noted to have >20% PVC on telemetry. She was started on IV Amio and transitioned to po Amio. Also started on digoxin and spiro prior to discharge. BP was too soft to add any other medications. Discharge weight was 143 pounds.   Admitted 7/13-18/2020 with ADHF in setting of new-onset AF in the setting of hyperthyroidism. TEE with EF 10% RV ok. Started on amiodarone and diuresed. Underwent TEE/DC-CV and felt much better. Also treated with methimazole and prednisone.   Seen again 04/2019.Weightwasup a few pounds. Zio patch placed to monitor PVC burden   Zio patch 8/20 - NSR avg HR 75 - 13 runs NSVT longest 14 beats - 59 runs SVT longest 9.4 seconds - PVC burden 4.8%  Admitted10/14-10/17/20 to [place] for COVID 19 infection c/b gastroenteritis and recurrent afib w/ RVR. Was loaded on IV amiodaroneand IV digoxin and rate improved but did not convert back to NSR. Was placed on Eliquis and discharged home on PO digoxin and metoprolol. Was advised to f/u in [facility] for further management.   Was seen in AHF clinic 07/03/19 for f/u with rapid AF in the 150s. She was volume overloaded. She was directly admitted to [facility] for further management  Was placed  on IV amiodarone for afib. Eliquis resumed. TSH low normal, 0.354. Free T3 WNL 3.7. Free elevated T4 1.35. Methimazole and prednisone continued for hyperthyroidism.  EP was consulted. Initial plans were for PVI ablation but procedure canceled after TEE showed severely reduced EF at 5% and severe LAE. Pt underwent DCCV instead. Converted to sinus tach. After IV amiodarone load, was switched to oral. She improved post conversion as well as w/ diuresis. Was switched from IV to PO Lasix. Co-ox was monitored and remained stable, 60-65%. No IV inotrope requirements. She was also treated w/ Losartan (BP too soft for Entesto), spironolactone and digoxin. Overall not felt to be a great candidate for advanced therapies, pharmacist did thorough home med review and she appears to be quite noncompliant. Discharged 07/10/19.  Saw Dr. Rayann Heman on 10/22/19 and was still in NSR. Discussed possible AF ablation. There was concern over degree of MR and LAE.   Echo 2/1 EF 30-35% moderate MR. Personally reviewed  Echo 01/10/20 EF 25-30%    She finished treatment for hyperthyroidism with Dr. [name] 5/21.    Had recent f/u w/ Dr. Haroldine Laws 8/21 and was fairly stable.   She returns to clinic now for post hospital f/u. Recently admitted to [facility] earlier this month for dyspnea. Records are not available for my review, but report provided by patient and her granddaughter. She was reportedly treated for PNA and CHF. Treated a/ abx and lasix. Had low BP and was taken off losartan and Coreg. She was discharged home on PO diuretics  but only taking lasix 2 days a week, MWF.   She feels somewhat better but still feels mildly SOB w/ certain activities and feels tired. No resting dyspnea. EKG shows NSR w/ freequent PVCs. ReDs clip elevated at 39%. BP is soft 100/60.        Cardiac Studies   Echo 03/2017 EF 10-15% Echo 07/2017 EF 25-30%  Echo 11/2017 EF 25-30% Echo 06/2018: EF ~30% (per Dr Haroldine Laws) Echo 8/20 EF  25-30% TEE 11/20 EF 5-10% Echo 5/21 EF 25-30%. RV normal.   R/LHC 8/ 018 LHC - normal cors  Right Heart RA (mean): 5 mmHg RV (S/EDP): 52/4 mmHg PA (S/D, mean): 52/20 (31) mmHg PCWP (mean): 30 mmHg  Ao sat: 96% PA sat: 71% RA sat: 74%  Fick CO: 5.3 L/min Fick CI: 3.3 L/min/m^2  SVR: 1,101 dynscm-5   Past Medical History:  Diagnosis Date  . A-fib ([place])   . Abnormal TSH   . Anemia   . Arthritis    "knees, hands" (04/15/2017)  . Breast cancer ([place])   . CHF (congestive heart failure) ([place])   . Chronic knee pain   . Chronic systolic CHF (congestive heart failure) ([place])   . COVID-19   . Does not have health insurance   . Frequent PVCs   . Hyperthyroidism 03/05/2019  . Hyperthyroidism 03/05/2019  . 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 NICM (nonischemic cardiomyopathy) ([place])    a. ? PVC cardiomyopathy. right and left heart catheterization on 04/15/17, no CAD. Fick output/index 5.28/3.34.  . Noncompliance    a. episodic noncompliance.  . Pneumonia 06/2016    Current Outpatient Medications  Medication Sig Dispense Refill  . albuterol (VENTOLIN HFA) 108 (90 Base) MCG/ACT inhaler Inhale 2 puffs into the lungs every 4 (four) hours as needed for wheezing or shortness of breath.     Marland Kitchen amiodarone (PACERONE) 200 MG tablet TOMAR 1 TABLETA DIARIA (TAKE ONE TABLET EVERY DAY) 90 tablet 3  . anastrozole (ARIMIDEX) 1 MG tablet Take 1 mg by mouth daily.    Marland Kitchen apixaban (ELIQUIS) 5 MG TABS tablet Take 1 tablet (5 mg total) by mouth 2 (two) times daily. 60 tablet 11  . ciclesonide (ALVESCO) 160 MCG/ACT inhaler Inhale 1 puff into the lungs 2 (two) times daily.    . furosemide (LASIX) 40 MG tablet Take 1 tablet (40 mg total) by mouth 3 (three) times a week. Every Mon, Wed and Friday 15 tablet 6  . losartan (COZAAR) 50 MG tablet Take 1 tablet (50 mg total) by mouth daily. 30 tablet 6  . Omega-3 Fatty Acids (OMEGA 3 PO) Take 1 tablet by mouth daily.    . pantoprazole  (PROTONIX) 40 MG tablet Take 40 mg by mouth daily.    Marland Kitchen Specialty Vitamins Products (ONE-A-DAY BONE STRENGTH PO) Take 1 tablet by mouth daily.     No current facility-administered medications for this encounter.    No Known Allergies    Social History   Socioeconomic History  . Marital status: Single    Spouse name: Not on file  . Number of children: Not on file  . Years of education: Not on file  . Highest education level: Not on file  Occupational History  . Not on file  Tobacco Use  . Smoking status: Never Smoker  . Smokeless tobacco: Never Used  Vaping Use  . Vaping Use: Never used  Substance and Sexual Activity  . Alcohol use: Not Currently    Comment: 04/15/2017 "might  have a couple beers q couple months"  . Drug use: Not Currently  . Sexual activity: Not Currently  Other Topics Concern  . Not on file  Social History Narrative   ** Merged History Encounter **       N/A   Social Determinants of Health   Financial Resource Strain: High Risk  . Difficulty of Paying Living Expenses: Very hard  Food Insecurity: Unknown  . Worried About Charity fundraiser in the Last Year: Patient refused  . Ran Out of Food in the Last Year: Patient refused  Transportation Needs: Unknown  . Lack of Transportation (Medical): Patient refused  . Lack of Transportation (Non-Medical): Patient refused  Physical Activity: Unknown  . Days of Exercise per Week: Patient refused  . Minutes of Exercise per Session: Patient refused  Stress: No Stress Concern Present  . Feeling of Stress : Only a little  Social Connections: Unknown  . Frequency of Communication with Friends and Family: Patient refused  . Frequency of Social Gatherings with Friends and Family: Patient refused  . Attends Religious Services: Patient refused  . Active Member of Clubs or Organizations: Patient refused  . Attends Archivist Meetings: Patient refused  . Marital Status: Patient refused  Intimate Partner  Violence: Unknown  . Fear of Current or Ex-Partner: Patient refused  . Emotionally Abused: Patient refused  . Physically Abused: Patient refused  . Sexually Abused: Patient refused      Family History  Problem Relation Age of Onset  . Hypertension Mother     Vitals:   05/20/20 1431  BP: 100/60  Pulse: 90  SpO2: 99%  Weight: 64.4 kg (142 lb)    PHYSICAL EXAM: General:  Well appearing. No respiratory difficulty HEENT: normal Neck: supple. no JVD. Carotids 2+ bilat; no bruits. No lymphadenopathy or thyromegaly appreciated. Cor: PMI nondisplaced. Regular rate & rhythm. No rubs, gallops or murmurs. Lungs: clear Abdomen: soft, nontender, nondistended. No hepatosplenomegaly. No bruits or masses. Good bowel sounds. Extremities: no cyanosis, clubbing, rash, edema Neuro: alert & oriented x 3, cranial nerves grossly intact. moves all 4 extremities w/o difficulty. Affect pleasant.  ECG: NSR w/ 3 PVCs 81 bpm  No ST-T wave abnormalities.  Personally reviewed   ASSESSMENT & PLAN:  1. Atrial Fibrillation, persistent:  - s/p previous DC-CV in 7/20. 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  Previously was on amio for PVCs but developed hyperthyroidism, now being treated w/ methimazole. Admitted 06/2019 for persistent afib w/ RVR + a/c systolic CHF. Loaded w/ IV amiodarone and evaluated by EP. Initial plans were for PVI ablation but procedure canceled after TEE showed severely reduced EF at 5% and severe LAE. Pt underwent DCCV instead.  - Remains in NSR today w/ PVCs.  - Continue amio.  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 Denies abnormal bleeding.  - Has seen Dr. Rayann Heman in 2/21. Planned for AF ablation  - Unfortunately does not have insurance coverage for AF ablation. Continue medical treatment - TFTs and HFTs checked 8/21 and ok  - If EF improves can consider switching amio to flecainide down the road.   2.Chronic systolic YCX:KGYJ,  normal cors in 03/2017. 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 03/2017 EF 15-20% Echo 07/28/17 EF 25-30%  - Echo 06/2018 EF ~30% - TEE 11/20 EF 5% in setting of AF - Echo 2/21 EF 30-35% - Echo 5/21 EF 25-30%. RV ok  - recent admit at [facility] 9/21 for PNA and CHF.  Losartan and coreg discontinued due to low BP  - NYHA II-III. Mildly fluid overloaded by ReDs clip, 39%. - Increase Lasix to 40 mg daily  - Continue to hold losartan and Coreg for now given low BP  - Off digoxin due to intolerance - Ideally would like to add spiro and SGLT2i soon but I am concerned about med compliance and cost for her    3. 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 Free T3 WNL 80.  Free elevated T4 normal 9.8 - Off methimazole  - Continue amio despite amio-induced hyperthyroidism as it is our only option. - Follows with Dr. Monna Fam    4.H/oFrequent PVCs - Zio patch8/20with 5% PVC burden  - Amio as above - she has several PVCs on EKG today. Will repeat Zio patch x 3 days to reassess burden   5. Left Breast Cancer. -treated w/XRT  6. Medication noncompliance -seems a bit better. language barrier plays a role -unfortunately, unable to arrange paramedicine as she lives outside of [place]  F/u in 2 weeks to review monitor results and reassess fluid status.    Megan Jester, PA-C 05/20/20

## 2020-05-20 NOTE — Progress Notes (Signed)
Zio patch placed onto patient.  All instructions and information reviewed with patient, they verbalize understanding with no questions. 

## 2020-05-20 NOTE — Progress Notes (Signed)
ReDS Vest / Clip - 05/20/20 1500      ReDS Vest / Clip   Station Marker A (P)     Ruler Value 30 (P)     ReDS Value Range Moderate volume overload (P)     ReDS Actual Value 39 (P)     Anatomical Comments Sitting

## 2020-05-20 NOTE — Patient Instructions (Signed)
INCREASE Lasix to 40 mg, one tab daily  Labs today We will only contact you if something comes back abnormal or we need to make some changes. Otherwise no news is good news!   Your physician recommends that you schedule a follow-up appointment in: 7-14 days  If you have any questions or concerns before your next appointment please send [place] a message through [place] or call our office at [phone].    TO LEAVE A MESSAGE FOR THE NURSE SELECT OPTION 2, PLEASE LEAVE A MESSAGE INCLUDING: . YOUR NAME . DATE OF BIRTH . CALL BACK NUMBER . REASON FOR CALL**this is important as we prioritize the call backs  YOU WILL RECEIVE A CALL BACK THE SAME DAY AS LONG AS YOU CALL BEFORE 4:00 PM

## 2020-05-21 ENCOUNTER — Other Ambulatory Visit (HOSPITAL_COMMUNITY): Payer: Self-pay | Admitting: *Deleted

## 2020-05-21 MED ORDER — APIXABAN 5 MG PO TABS: 5.0000 mg | ORAL_TABLET | Freq: Two times a day (BID) | ORAL | 11 refills | Status: DC

## 2020-05-22 ENCOUNTER — Encounter (HOSPITAL_COMMUNITY): Payer: Self-pay

## 2020-05-29 ENCOUNTER — Other Ambulatory Visit: Payer: Self-pay

## 2020-05-29 ENCOUNTER — Encounter (HOSPITAL_COMMUNITY): Payer: Self-pay

## 2020-05-29 ENCOUNTER — Ambulatory Visit (HOSPITAL_COMMUNITY)
Admission: RE | Admit: 2020-05-29 | Discharge: 2020-05-29 | Disposition: A | Discharge status: Home / Self Care | Payer: Self-pay | Source: Ambulatory Visit | Attending: Adult Health | Admitting: Adult Health

## 2020-05-29 VITALS — BP 106/72 | HR 74 | Wt 143.6 lb

## 2020-05-29 DIAGNOSIS — I5022 Chronic systolic (congestive) heart failure: Secondary | ICD-10-CM

## 2020-05-29 DIAGNOSIS — Z8249 Family history of ischemic heart disease and other diseases of the circulatory system: Secondary | ICD-10-CM | POA: Insufficient documentation

## 2020-05-29 DIAGNOSIS — M1990 Unspecified osteoarthritis, unspecified site: Secondary | ICD-10-CM | POA: Insufficient documentation

## 2020-05-29 DIAGNOSIS — R0600 Dyspnea, unspecified: Secondary | ICD-10-CM

## 2020-05-29 DIAGNOSIS — I4819 Other persistent atrial fibrillation: Secondary | ICD-10-CM | POA: Insufficient documentation

## 2020-05-29 DIAGNOSIS — I493 Ventricular premature depolarization: Secondary | ICD-10-CM | POA: Insufficient documentation

## 2020-05-29 DIAGNOSIS — I428 Other cardiomyopathies: Secondary | ICD-10-CM | POA: Insufficient documentation

## 2020-05-29 DIAGNOSIS — E0590 Thyrotoxicosis, unspecified without thyrotoxic crisis or storm: Secondary | ICD-10-CM

## 2020-05-29 DIAGNOSIS — Z853 Personal history of malignant neoplasm of breast: Secondary | ICD-10-CM | POA: Insufficient documentation

## 2020-05-29 DIAGNOSIS — Z7901 Long term (current) use of anticoagulants: Secondary | ICD-10-CM | POA: Insufficient documentation

## 2020-05-29 DIAGNOSIS — Z79899 Other long term (current) drug therapy: Secondary | ICD-10-CM | POA: Insufficient documentation

## 2020-05-29 DIAGNOSIS — R0609 Other forms of dyspnea: Secondary | ICD-10-CM

## 2020-05-29 DIAGNOSIS — Z7951 Long term (current) use of inhaled steroids: Secondary | ICD-10-CM | POA: Insufficient documentation

## 2020-05-29 DIAGNOSIS — Z8616 Personal history of COVID-19: Secondary | ICD-10-CM | POA: Insufficient documentation

## 2020-05-29 DIAGNOSIS — Z9114 Patient's other noncompliance with medication regimen: Secondary | ICD-10-CM | POA: Insufficient documentation

## 2020-05-29 DIAGNOSIS — I480 Paroxysmal atrial fibrillation: Secondary | ICD-10-CM

## 2020-05-29 LAB — BASIC METABOLIC PANEL
Anion gap: 10 (ref 5–15)
BUN: 17 mg/dL (ref 6–20)
CO2: 24 mmol/L (ref 22–32)
Calcium: 9.1 mg/dL (ref 8.9–10.3)
Chloride: 106 mmol/L (ref 98–111)
Creatinine, Ser: 0.82 mg/dL (ref 0.44–1.00)
GFR calc non Af Amer: >60 mL/min (ref >60)
Glucose, Bld: 108 mg/dL — ABNORMAL HIGH (ref 70–99)
Potassium: 3.3 mmol/L — ABNORMAL LOW (ref 3.5–5.1)
Sodium: 140 mmol/L (ref 135–145)

## 2020-05-29 MED ORDER — CARVEDILOL 3.125 MG PO TABS: 3.1250 mg | ORAL_TABLET | Freq: Two times a day (BID) | ORAL | 3 refills | Status: DC

## 2020-05-29 NOTE — Progress Notes (Signed)
Advanced Heart Failure Clinic Note   Referring Physician: PCP: Kerin Perna, NP PCP-Cardiologist: Glori Bickers, MD  EP: Dr. 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   She presented to [facility] on 04/13/17 with neck pain, SOB and orthopnea. Last EF prior to that reported by Dr. [name] was 35%. Repeat echo showed reduced EF of 15%, she was transferred to [facility] for further evaluation. Underwent right and left heart catheterization on 04/15/17, no CAD. Fick output/index 5.28/3.34. Full results below.   Noted to have >20% PVC on telemetry. She was started on IV Amio and transitioned to po Amio. Also started on digoxin and spiro prior to discharge. BP was too soft to add any other medications. Discharge weight was 143 pounds.   Admitted 7/13-18/2020 with ADHF in setting of new-onset AF in the setting of hyperthyroidism. TEE with EF 10% RV ok. Started on amiodarone and diuresed. Underwent TEE/DC-CV and felt much better. Also treated with methimazole and prednisone.   Seen again 04/2019.Weightwasup a few pounds. Zio patch placed to monitor PVC burden   Zio patch 8/20 - NSR avg HR 75 - 13 runs NSVT longest 14 beats - 59 runs SVT longest 9.4 seconds - PVC burden 4.8%  Admitted10/14-10/17/20 to [place] for COVID 19 infection c/b gastroenteritis and recurrent afib w/ RVR. Was loaded on IV amiodaroneand IV digoxin and rate improved but did not convert back to NSR. Was placed on Eliquis and discharged home on PO digoxin and metoprolol. Was advised to f/u in [facility] for further management.   Was seen in AHF clinic 07/03/19 for f/u with rapid AF in the 150s. She was volume overloaded. She was directly admitted to [facility] for further management  Was placed on IV amiodarone for afib. Eliquis resumed. TSH low  normal, 0.354. Free T3 WNL 3.7. Free elevated T4 1.35. Methimazole and prednisone continued for hyperthyroidism.  EP was consulted. Initial plans were for PVI ablation but procedure canceled after TEE showed severely reduced EF at 5% and severe LAE. Pt underwent DCCV instead. Converted to sinus tach. After IV amiodarone load, was switched to oral. She improved post conversion as well as w/ diuresis. Was switched from IV to PO Lasix. Co-ox was monitored and remained stable, 60-65%. No IV inotrope requirements. She was also treated w/ Losartan (BP too soft for Entesto), spironolactone and digoxin. Overall not felt to be a great candidate for advanced therapies, pharmacist did thorough home med review and she appears to be quite noncompliant. Discharged 07/10/19.  Saw Dr. Rayann Heman on 10/22/19 and was still in NSR. Discussed possible AF ablation. There was concern over degree of MR and LAE.   Echo 2/1 EF 30-35% moderate MR. Personally reviewed  Echo 01/10/20 EF 25-30%    She finished treatment for hyperthyroidism with Dr. [name] 5/21.    Had recent f/u w/ Dr. Haroldine Laws 8/21 and was fairly stable.   Admitted 04/2020 to [facility] for dyspnea. Records are not available for my review, but report provided by patient and her granddaughter. She was reportedly treated for PNA and CHF. Treated a/ abx and lasix. Had low BP and was taken off losartan and Coreg. She was discharged home on PO diuretics but only taking lasix 2 days a week, MWF.   Today she returns for HF follow up with her granddaughter. Marland Kitchen  Last visit lasix was restarted. Overall feeling fine. SOB with exertion but . Able to walk the grocery store.  Denies PND/Orthopnea. Appetite ok. No fever or chills. Weight at home 139-141  pounds. Taking all medications .  Cardiac Studies  Echo 03/2017 EF 10-15% Echo 07/2017 EF 25-30%  Echo 11/2017 EF 25-30% Echo 06/2018: EF ~30% (per Dr Haroldine Laws) Echo 8/20 EF 25-30% TEE 11/20 EF 5-10% Echo 5/21 EF  25-30%. RV normal.   R/LHC 8/ 018 LHC - normal cors  Right Heart RA (mean): 5 mmHg RV (S/EDP): 52/4 mmHg PA (S/D, mean): 52/20 (31) mmHg PCWP (mean): 30 mmHg  Ao sat: 96% PA sat: 71% RA sat: 74%  Fick CO: 5.3 L/min Fick CI: 3.3 L/min/m^2  SVR: 1,101 dynscm-5   Past Medical History:  Diagnosis Date  . A-fib ([place])   . Abnormal TSH   . Anemia   . Arthritis    "knees, hands" (04/15/2017)  . Breast cancer ([place])   . CHF (congestive heart failure) ([place])   . Chronic knee pain   . Chronic systolic CHF (congestive heart failure) ([place])   . COVID-19   . Does not have health insurance   . Frequent PVCs   . Hyperthyroidism 03/05/2019  . Hyperthyroidism 03/05/2019  . 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 NICM (nonischemic cardiomyopathy) ([place])    a. ? PVC cardiomyopathy. right and left heart catheterization on 04/15/17, no CAD. Fick output/index 5.28/3.34.  . Noncompliance    a. episodic noncompliance.  . Pneumonia 06/2016    Current Outpatient Medications  Medication Sig Dispense Refill  . albuterol (VENTOLIN HFA) 108 (90 Base) MCG/ACT inhaler Inhale 2 puffs into the lungs every 4 (four) hours as needed for wheezing or shortness of breath.     Marland Kitchen amiodarone (PACERONE) 200 MG tablet TOMAR 1 TABLETA DIARIA (TAKE ONE TABLET EVERY DAY) 90 tablet 3  . anastrozole (ARIMIDEX) 1 MG tablet Take 1 mg by mouth daily.    Marland Kitchen apixaban (ELIQUIS) 5 MG TABS tablet Take 1 tablet (5 mg total) by mouth 2 (two) times daily. 60 tablet 11  . ciclesonide (ALVESCO) 160 MCG/ACT inhaler Inhale 1 puff into the lungs 2 (two) times daily.    . furosemide (LASIX) 40 MG tablet Take 1 tablet (40 mg total) by mouth daily. 30 tablet 6  . Omega-3 Fatty Acids (OMEGA 3 PO) Take 1 tablet by mouth daily.    . pantoprazole (PROTONIX) 40 MG tablet Take 40 mg by mouth daily.    Marland Kitchen Specialty Vitamins Products (ONE-A-DAY BONE STRENGTH PO) Take 1 tablet by mouth daily.     No current facility-administered  medications for this encounter.    No Known Allergies    Social History   Socioeconomic History  . Marital status: Single    Spouse name: Not on file  . Number of children: Not on file  . Years of education: Not on file  . Highest education level: Not on file  Occupational History  . Not on file  Tobacco Use  . Smoking status: Never Smoker  . Smokeless tobacco: Never Used  Vaping Use  . Vaping Use: Never used  Substance and Sexual Activity  . Alcohol use: Not Currently    Comment: 04/15/2017 "might have a couple beers q couple months"  . Drug use: Not Currently  . Sexual activity: Not Currently  Other Topics Concern  . Not on file  Social History Narrative   ** Merged History Encounter **  N/A   Social Determinants of Health   Financial Resource Strain: High Risk  . Difficulty of Paying Living Expenses: Very hard  Food Insecurity: Unknown  . Worried About Charity fundraiser in the Last Year: Patient refused  . Ran Out of Food in the Last Year: Patient refused  Transportation Needs: Unknown  . Lack of Transportation (Medical): Patient refused  . Lack of Transportation (Non-Medical): Patient refused  Physical Activity: Unknown  . Days of Exercise per Week: Patient refused  . Minutes of Exercise per Session: Patient refused  Stress: No Stress Concern Present  . Feeling of Stress : Only a little  Social Connections: Unknown  . Frequency of Communication with Friends and Family: Patient refused  . Frequency of Social Gatherings with Friends and Family: Patient refused  . Attends Religious Services: Patient refused  . Active Member of Clubs or Organizations: Patient refused  . Attends Archivist Meetings: Patient refused  . Marital Status: Patient refused  Intimate Partner Violence: Unknown  . Fear of Current or Ex-Partner: Patient refused  . Emotionally Abused: Patient refused  . Physically Abused: Patient refused  . Sexually Abused: Patient  refused      Family History  Problem Relation Age of Onset  . Hypertension Mother     Vitals:   05/29/20 1530  BP: 106/72  Pulse: 74  SpO2: 100%  Weight: 65.1 kg (143 lb 9.6 oz)   Wt Readings from Last 3 Encounters:  05/29/20 65.1 kg (143 lb 9.6 oz)  05/20/20 64.4 kg (142 lb)  04/07/20 65.8 kg (145 lb)    Spanish Interpreter Present PHYSICAL EXAM: General:  Well appearing. No resp difficulty HEENT: normal Neck: supple. no JVD. Carotids 2+ bilat; no bruits. No lymphadenopathy or thryomegaly appreciated. Cor: PMI nondisplaced. Regular rate & rhythm. No rubs, gallops or murmurs. Lungs: clear Abdomen: soft, nontender, nondistended. No hepatosplenomegaly. No bruits or masses. Good bowel sounds. Extremities: no cyanosis, clubbing, rash, edema Neuro: alert & orientedx3, cranial nerves grossly intact. moves all 4 extremities w/o difficulty. Affect pleasant   ASSESSMENT & PLAN:  1. Atrial Fibrillation, persistent:  - s/p previous DC-CV in 7/20. 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  Previously was on amio for PVCs but developed hyperthyroidism, now being treated w/ methimazole. Admitted 06/2019 for persistent afib w/ RVR + a/c systolic CHF. Loaded w/ IV amiodarone and evaluated by EP. Initial plans were for PVI ablation but procedure canceled after TEE showed severely reduced EF at 5% and severe LAE. Pt underwent DCCV instead.  - Regular onexam - Continue amio.  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 Denies abnormal bleeding.  - Has seen Dr. Rayann Heman in 2/21. Planned for AF ablation but does not have insurance coverage for AF ablation. Continue medical treatment - TFTs and HFTs checked 8/21 and ok  - If EF improves can consider switching amio to flecainide down the road.   2.Chronic systolic ZES:PQZR, normal cors in 03/2017. 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 03/2017 EF 15-20% Echo 07/28/17 EF 25-30%  - Echo 06/2018 EF ~30% - TEE  11/20 EF 5% in setting of AF - Echo 2/21 EF 30-35% - Echo 5/21 EF 25-30%. RV ok  -NYHA III. Volume status Lasix to 40 mg daily  - Restart coreg 3.125 mg twice a day.   - Losartan was stopped during hospitalization at [facility] due to hypotension. Hold off for now.  - Off digoxin due to intolerance -Check BMET  3.  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 Free T3 WNL 80.  Free elevated T4 normal 9.8 - Off methimazole  - Continue amio despite amio-induced hyperthyroidism as it is our only option. - Follows with Dr. Monna Fam    4.H/oFrequent PVCs - Zio patch8/20with 5% PVC burden  - Amio as above  5. Left Breast Cancer. -treated w/XRT  6. Medication noncompliance -Discussed medications with granddaughter. She seems to be taking all medications.   Follow up next month with an ECHO and Dr Haroldine Laws. Greater than 50% of the (total minutes 25 visit spent in counseling/coordination of care regarding the above and medication changes.    Darrick Grinder, NP 05/29/20

## 2020-05-29 NOTE — Patient Instructions (Addendum)
Start Carvedilol 3.125 mg Twice daily   Labs done today, we will call you for abnormal results  Keep follow up as scheduled  If you have any questions or concerns before your next appointment please send [place] a message through [place] or call our office at [phone].    TO LEAVE A MESSAGE FOR THE NURSE SELECT OPTION 2, PLEASE LEAVE A MESSAGE INCLUDING: . YOUR NAME . DATE OF BIRTH . CALL BACK NUMBER . REASON FOR CALL**this is important as we prioritize the call backs  YOU WILL RECEIVE A CALL BACK THE SAME DAY AS LONG AS YOU CALL BEFORE 4:00 PM

## 2020-06-06 ENCOUNTER — Telehealth (HOSPITAL_COMMUNITY): Payer: Self-pay | Admitting: Cardiology

## 2020-06-06 MED ORDER — POTASSIUM CHLORIDE CRYS ER 20 MEQ PO TBCR: 20.0000 meq | EXTENDED_RELEASE_TABLET | Freq: Every day | ORAL | 3 refills | Status: DC

## 2020-06-06 NOTE — Telephone Encounter (Signed)
Pt aware via grand daughter

## 2020-06-06 NOTE — Telephone Encounter (Signed)
-----   Message from Conrad Rose Bud, NP sent at 05/29/2020 10:11 PM EDT ----- Please call. K low. Add 20 meq potassium

## 2020-06-23 NOTE — Addendum Note (Signed)
Encounter addended by: Micki Riley, RN on: 06/23/2020 2:59 PM  Actions taken: Imaging Exam ended

## 2020-07-04 ENCOUNTER — Other Ambulatory Visit: Payer: Self-pay

## 2020-07-08 ENCOUNTER — Encounter (HOSPITAL_COMMUNITY): Payer: Self-pay | Admitting: Internal Medicine

## 2020-07-08 ENCOUNTER — Ambulatory Visit (HOSPITAL_BASED_OUTPATIENT_CLINIC_OR_DEPARTMENT_OTHER)
Admission: RE | Admit: 2020-07-08 | Discharge: 2020-07-08 | Disposition: A | Discharge status: Home / Self Care | Payer: Self-pay | Source: Ambulatory Visit | Attending: Internal Medicine | Admitting: Internal Medicine

## 2020-07-08 ENCOUNTER — Other Ambulatory Visit: Payer: Self-pay

## 2020-07-08 ENCOUNTER — Ambulatory Visit (HOSPITAL_COMMUNITY)
Admission: RE | Admit: 2020-07-08 | Discharge: 2020-07-08 | Disposition: A | Discharge status: Home / Self Care | Payer: Self-pay | Source: Ambulatory Visit | Attending: Internal Medicine | Admitting: Internal Medicine

## 2020-07-08 VITALS — BP 106/74 | HR 87 | Wt 141.4 lb

## 2020-07-08 DIAGNOSIS — I5023 Acute on chronic systolic (congestive) heart failure: Secondary | ICD-10-CM

## 2020-07-08 DIAGNOSIS — I428 Other cardiomyopathies: Secondary | ICD-10-CM | POA: Insufficient documentation

## 2020-07-08 DIAGNOSIS — Z7901 Long term (current) use of anticoagulants: Secondary | ICD-10-CM | POA: Insufficient documentation

## 2020-07-08 DIAGNOSIS — I493 Ventricular premature depolarization: Secondary | ICD-10-CM | POA: Insufficient documentation

## 2020-07-08 DIAGNOSIS — I480 Paroxysmal atrial fibrillation: Secondary | ICD-10-CM

## 2020-07-08 DIAGNOSIS — Z79899 Other long term (current) drug therapy: Secondary | ICD-10-CM | POA: Insufficient documentation

## 2020-07-08 DIAGNOSIS — Z9114 Patient's other noncompliance with medication regimen: Secondary | ICD-10-CM | POA: Insufficient documentation

## 2020-07-08 DIAGNOSIS — Z853 Personal history of malignant neoplasm of breast: Secondary | ICD-10-CM | POA: Insufficient documentation

## 2020-07-08 DIAGNOSIS — I5022 Chronic systolic (congestive) heart failure: Secondary | ICD-10-CM

## 2020-07-08 DIAGNOSIS — Z596 Low income: Secondary | ICD-10-CM | POA: Insufficient documentation

## 2020-07-08 DIAGNOSIS — R042 Hemoptysis: Secondary | ICD-10-CM | POA: Insufficient documentation

## 2020-07-08 DIAGNOSIS — I4819 Other persistent atrial fibrillation: Secondary | ICD-10-CM | POA: Insufficient documentation

## 2020-07-08 DIAGNOSIS — T462X5D Adverse effect of other antidysrhythmic drugs, subsequent encounter: Secondary | ICD-10-CM | POA: Insufficient documentation

## 2020-07-08 DIAGNOSIS — E0580 Other thyrotoxicosis without thyrotoxic crisis or storm: Secondary | ICD-10-CM | POA: Insufficient documentation

## 2020-07-08 DIAGNOSIS — Z5941 Food insecurity: Secondary | ICD-10-CM | POA: Insufficient documentation

## 2020-07-08 DIAGNOSIS — Z79890 Hormone replacement therapy (postmenopausal): Secondary | ICD-10-CM | POA: Insufficient documentation

## 2020-07-08 LAB — BASIC METABOLIC PANEL
Anion gap: 12 (ref 5–15)
BUN: 10 mg/dL (ref 6–20)
CO2: 25 mmol/L (ref 22–32)
Calcium: 9.1 mg/dL (ref 8.9–10.3)
Chloride: 100 mmol/L (ref 98–111)
Creatinine, Ser: 0.82 mg/dL (ref 0.44–1.00)
GFR, Estimated: >60 mL/min (ref >60)
Glucose, Bld: 111 mg/dL — ABNORMAL HIGH (ref 70–99)
Potassium: 3.8 mmol/L (ref 3.5–5.1)
Sodium: 137 mmol/L (ref 135–145)

## 2020-07-08 LAB — CBC
HCT: 41.6 % (ref 36.0–46.0)
Hemoglobin: 13.5 g/dL (ref 12.0–15.0)
MCH: 30.3 pg (ref 26.0–34.0)
MCHC: 32.5 g/dL (ref 30.0–36.0)
MCV: 93.3 fL (ref 80.0–100.0)
Platelets: 305 10*3/uL (ref 150–400)
RBC: 4.46 MIL/uL (ref 3.87–5.11)
RDW: 13.9 % (ref 11.5–15.5)
WBC: 9.4 10*3/uL (ref 4.0–10.5)
nRBC: 0 % (ref 0.0–0.2)

## 2020-07-08 LAB — ECHOCARDIOGRAM COMPLETE
Area-P 1/2: 6.15 cm2
Calc EF: 40.7 %
S' Lateral: 5 cm
Single Plane A2C EF: 39.8 %
Single Plane A4C EF: 36.2 %

## 2020-07-08 LAB — BRAIN NATRIURETIC PEPTIDE: B Natriuretic Peptide: 436.9 pg/mL — ABNORMAL HIGH (ref 0.0–100.0)

## 2020-07-08 LAB — DIGOXIN LEVEL: Digoxin Level: 0.2 ng/mL — ABNORMAL LOW (ref 1.0–2.0)

## 2020-07-08 MED ORDER — MEXILETINE HCL 200 MG PO CAPS: 200.0000 mg | ORAL_CAPSULE | Freq: Two times a day (BID) | ORAL | 3 refills | Status: DC

## 2020-07-08 MED ORDER — SPIRONOLACTONE 25 MG PO TABS: 12.5000 mg | ORAL_TABLET | Freq: Every day | ORAL | 3 refills | Status: DC

## 2020-07-08 NOTE — Progress Notes (Signed)
°  Echocardiogram 2D Echocardiogram has been performed.  Darlina Sicilian M 07/08/2020, 2:47 PM

## 2020-07-08 NOTE — Progress Notes (Signed)
Advanced Heart Failure Clinic Note   Referring Physician: PCP: Kerin Perna, NP PCP-Cardiologist: Glori Bickers, MD  EP: Dr. 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   She presented to [facility] on 04/13/17 with neck pain, SOB and orthopnea. Last EF prior to that reported by Dr. [name] was 35%. Repeat echo showed reduced EF of 15%, she was transferred to [facility] for further evaluation. Underwent right and left heart catheterization on 04/15/17, no CAD. Fick output/index 5.28/3.34. Full results below.   Noted to have >20% PVC on telemetry. She was started on IV Amio and transitioned to po Amio. Also started on digoxin and spiro prior to discharge. BP was too soft to add any other medications. Discharge weight was 143 pounds.   Admitted 7/13-18/2020 with ADHF in setting of new-onset AF in the setting of hyperthyroidism. TEE with EF 10% RV ok. Started on amiodarone and diuresed. Underwent TEE/DC-CV and felt much better. Also treated with methimazole and prednisone.   Seen again 04/2019.Weightwasup a few pounds. Zio patch placed to monitor PVC burden   Zio patch 8/20 - NSR avg HR 75 - 13 runs NSVT longest 14 beats - 59 runs SVT longest 9.4 seconds - PVC burden 4.8%  Admitted10/20 for COVID 19 infection c/b recurrent afib w/ RVR. Was loaded on IV amiodaroneand rate improved but did not convert back to NSR. Was placed on Eliquis and discharged home on PO digoxin and metoprolol. Was advised to f/u in [facility] for further management.   Was seen in AHF clinic 07/03/19 for f/u with rapid AF in the 150s. She was volume overloaded. She was directly admitted to [facility] for further management  Was placed on IV amiodarone for afib. Eliquis resumed. TSH low normal, 0.354. Free T3 WNL 3.7. Free elevated T4 1.35.  Methimazole and prednisone continued for hyperthyroidism.  EP was consulted. Initial plans were for PVI ablation but procedure canceled after TEE showed severely reduced EF at 5% and severe LAE. Pt underwent DCCV instead. Converted to sinus tach. After IV amiodarone load, was switched to oral. She improved post DC-CV. Co-ox was monitored and remained stable, 60-65%. No IV inotrope requirements. She was also treated w/ Losartan (BP too soft for Entesto), spironolactone and digoxin. Overall not felt to be a great candidate for advanced therapies, pharmacist did thorough home med review and she appears to be quite noncompliant. Discharged 07/10/19.  Saw Dr. Rayann Heman on 10/22/19 and was still in NSR. Discussed possible AF ablation. There was concern over degree of MR and LAE.   Echo 2/1 EF 30-35% moderate MR. Personally reviewed  Echo 01/10/20 EF 25-30%    She finished treatment for hyperthyroidism with Dr. [name] 5/21.    Admitted 04/2020 to [facility] for dyspnea. Records are not available for review, but report provided by patient and her granddaughter. She was reportedly treated for mild hemoptysis in setting of PNA and CHF. Treated a/ abx and lasix. Appears to have had a chest CT Had low BP and was taken off losartan and Coreg. She was discharged home on PO diuretics but only taking lasix 2 days a week, MWF.   Today she returns for HF follow up with her granddaughter. Last visit Coreg was restarted. Overall feeling fine. SOB with exertion but able to do chores.  Denies PND/Orthopnea. Some palpitations &  CP at rest but resolve spontaneously. Appetite ok. No fever or chills. Weight at home 139-141  pounds. Taking lasix daily. Taking all medications. Recent admission to [place] over the weekend with c/o hemoptysis. Reported CT chest was OK, symptoms have resolved for past 3 days.   Cardiac Studies  Echo 03/2017 EF 10-15% Echo 07/2017 EF 25-30%  Echo 11/2017 EF 25-30% Echo 06/2018: EF ~30% (per Dr  Haroldine Laws) Echo 8/20 EF 25-30% TEE 11/20 EF 5-10% Echo 5/21 EF 25-30%. RV normal.  Echo 11/21 EF 20-25% RV normal.  R/LHC 8/ 018 LHC - normal cors  Right Heart RA (mean): 5 mmHg RV (S/EDP): 52/4 mmHg PA (S/D, mean): 52/20 (31) mmHg PCWP (mean): 30 mmHg  Ao sat: 96% PA sat: 71% RA sat: 74%  Fick CO: 5.3 L/min Fick CI: 3.3 L/min/m^2  SVR: 1,101 dynscm-5   Past Medical History:  Diagnosis Date  . A-fib ([place])   . Abnormal TSH   . Anemia   . Arthritis    "knees, hands" (04/15/2017)  . Breast cancer ([place])   . CHF (congestive heart failure) ([place])   . Chronic knee pain   . Chronic systolic CHF (congestive heart failure) ([place])   . COVID-19   . Does not have health insurance   . Frequent PVCs   . Hyperthyroidism 03/05/2019  . Hyperthyroidism 03/05/2019  . 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 NICM (nonischemic cardiomyopathy) ([place])    a. ? PVC cardiomyopathy. right and left heart catheterization on 04/15/17, no CAD. Fick output/index 5.28/3.34.  . Noncompliance    a. episodic noncompliance.  . Pneumonia 06/2016    Current Outpatient Medications  Medication Sig Dispense Refill  . amiodarone (PACERONE) 200 MG tablet TOMAR 1 TABLETA DIARIA (TAKE ONE TABLET EVERY DAY) 90 tablet 3  . anastrozole (ARIMIDEX) 1 MG tablet Take 1 mg by mouth daily.    Marland Kitchen apixaban (ELIQUIS) 5 MG TABS tablet Take 1 tablet (5 mg total) by mouth 2 (two) times daily. 60 tablet 11  . carvedilol (COREG) 3.125 MG tablet Take 1 tablet (3.125 mg total) by mouth 2 (two) times daily. 60 tablet 3  . cefdinir (OMNICEF) 300 MG capsule Take 300 mg by mouth 2 (two) times daily.    . furosemide (LASIX) 40 MG tablet Take 1 tablet (40 mg total) by mouth daily. 30 tablet 6  . methimazole (TAPAZOLE) 5 MG tablet Take 5 mg by mouth daily.    Marland Kitchen Specialty Vitamins Products (ONE-A-DAY BONE STRENGTH PO) Take 1 tablet by mouth daily.    Marland Kitchen albuterol (VENTOLIN HFA) 108 (90 Base) MCG/ACT inhaler Inhale 2 puffs  into the lungs every 4 (four) hours as needed for wheezing or shortness of breath.     . ciclesonide (ALVESCO) 160 MCG/ACT inhaler Inhale 1 puff into the lungs 2 (two) times daily. (Patient not taking: Reported on 07/08/2020)    . mexiletine (MEXITIL) 200 MG capsule Take 1 capsule (200 mg total) by mouth 2 (two) times daily. 60 capsule 3  . Omega-3 Fatty Acids (OMEGA 3 PO) Take 1 tablet by mouth daily. (Patient not taking: Reported on 07/08/2020)    . pantoprazole (PROTONIX) 40 MG tablet Take 40 mg by mouth daily. (Patient not taking: Reported on 07/08/2020)    . spironolactone (ALDACTONE) 25 MG tablet Take 0.5 tablets (12.5 mg total) by mouth daily. 15 tablet 3   No current facility-administered medications for this encounter.    No Known Allergies    Social History  Socioeconomic History  . Marital status: Single    Spouse name: Not on file  . Number of children: Not on file  . Years of education: Not on file  . Highest education level: Not on file  Occupational History  . Not on file  Tobacco Use  . Smoking status: Never Smoker  . Smokeless tobacco: Never Used  Vaping Use  . Vaping Use: Never used  Substance and Sexual Activity  . Alcohol use: Not Currently    Comment: 04/15/2017 "might have a couple beers q couple months"  . Drug use: Not Currently  . Sexual activity: Not Currently  Other Topics Concern  . Not on file  Social History Narrative   ** Merged History Encounter **       N/A   Social Determinants of Health   Financial Resource Strain: High Risk  . Difficulty of Paying Living Expenses: Very hard  Food Insecurity:   . Worried About Charity fundraiser in the Last Year: Not on file  . Ran Out of Food in the Last Year: Not on file  Transportation Needs:   . Lack of Transportation (Medical): Not on file  . Lack of Transportation (Non-Medical): Not on file  Physical Activity:   . Days of Exercise per Week: Not on file  . Minutes of Exercise per Session:  Not on file  Stress:   . Feeling of Stress : Not on file  Social Connections:   . Frequency of Communication with Friends and Family: Not on file  . Frequency of Social Gatherings with Friends and Family: Not on file  . Attends Religious Services: Not on file  . Active Member of Clubs or Organizations: Not on file  . Attends Archivist Meetings: Not on file  . Marital Status: Not on file  Intimate Partner Violence:   . Fear of Current or Ex-Partner: Not on file  . Emotionally Abused: Not on file  . Physically Abused: Not on file  . Sexually Abused: Not on file      Family History  Problem Relation Age of Onset  . Hypertension Mother     Vitals:   07/08/20 1537  BP: 106/74  Pulse: 87  SpO2: 100%  Weight: 64.1 kg (141 lb 6.4 oz)   Wt Readings from Last 3 Encounters:  07/08/20 64.1 kg (141 lb 6.4 oz)  05/29/20 65.1 kg (143 lb 9.6 oz)  05/20/20 64.4 kg (142 lb)    *Spanish Interpreter Present*  PHYSICAL EXAM: General:  Well appearing. No resp difficulty HEENT: normal Neck: supple. no JVD. Carotids 2+ bilat; no bruits. No lymphadenopathy or thryomegaly appreciated. Cor: PMI nondisplaced. Regular rate & rhythm, occasional irregular beats. No rubs, gallops or murmurs. Lungs: clear Abdomen: obese, soft, nontender, nondistended. No hepatosplenomegaly. No bruits or masses. Good bowel sounds. Extremities: no cyanosis, clubbing, rash, edema Neuro: alert & orientedx3, cranial nerves grossly intact. moves all 4 extremities w/o difficulty. Affect pleasant  ASSESSMENT & PLAN:  1. Afib, persistent:  - s/p previous DC-CV in 7/20. 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  Previously was on amio for PVCs but developed hyperthyroidism, now being treated w/ methimazole. Admitted 06/2019 for persistent afib w/ RVR + a/c systolic CHF. Loaded w/ IV amiodarone and evaluated by EP. Initial plans were for PVI ablation but procedure canceled after TEE showed severely reduced EF at  5% and severe LAE. Pt underwent DCCV instead.  - Regular on exam, frequent PVCs on Echo today & increased PVC burden  on latest Zio patch. - Continue amio.  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 Completeted rx in 5/21 - Continue Eliquis 5 mg BID. Denies abnormal bleeding. Samples given. - Has seen Dr. Rayann Heman in 2/21. Planned for AF ablation but does not have insurance coverage for AF ablation. Continue medical treatment - TFTs and HFTs checked 8/21 and ok  - Continue amiodarone 200 mg daily - Start Mexiletine 200 mg BID  - CBC today  2.Chronic systolic CHE:KBTC, normal cors in 03/2017. 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 03/2017 EF 15-20% Echo 07/28/17 EF 25-30%  - Echo 06/2018 EF ~30% - TEE 11/20 EF 5% in setting of AF - Echo 2/21 EF 30-35% - Echo 5/21 EF 25-30%. RV ok  - Echo today 07/08/20 EF 20-25% RV ok - NYHA II-III. Volume status ok  - Continue Lasix to 40 mg daily + 20 mEq KCl daily - Continue Coreg 3.125 mg twice a day.   - Start spiro 12.5 mg daily. - Off digoxin due to intolerance - BP historically been too soft for Entresto - Check BMET today & repeat in 7-10 days - Consider addition of SGLT2i or losartan at next visit - EF seems to be going back down in setting of recurrent PVCs. Add mexilitene - Have not proceeded with ICD given non-compliance and hope that EF would improve with rhythm management. Will reassess after PVCs better suppressed  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 Free T3 WNL 80.  Free elevated T4 normal 9.8. - Still on methimazole. Follows with Dr. Monna Fam, missed recent appointment due to being hospitalized. - Continue amio despite amio-induced hyperthyroidism; may be able to come off amio if success with added mexiletine.  - 4.H/oFrequent PVCs - Zio patch8/20with 5% PVC burden; Zio patch (3 days) 11/21 with 16% PVC burden. - Amio as above. - Start mexiletine as above.  5. Left Breast  Cancer. -treated w/XRT.  6. Hemoptysis - no further symptoms in 3 days; pneumonia vs bronchitis vs HF exacerbation - CT chest @ [name] WNL (per patient) - if recurs will need f/u with Pulmonary  6. Medication noncompliance -Discussed medications with granddaughter & interpretor. She seems to be taking all medications.    Glori Bickers, MD 07/08/20

## 2020-07-08 NOTE — Patient Instructions (Signed)
Start Mexilitine  200 mg Twice daily   Spironolactone 12.5 mg (1/2 tab) Daily  Lab work done today, we will call you with any abnormal results  Your physician recommends that you return for lab work in: 7 days  Your physician recommends that you schedule a follow-up appointment in:  2 months  If you have any questions or concerns before your next appointment please send [place] a message through [place] or call our office at [phone].    TO LEAVE A MESSAGE FOR THE NURSE SELECT OPTION 2, PLEASE LEAVE A MESSAGE INCLUDING: . YOUR NAME . DATE OF BIRTH . CALL BACK NUMBER . REASON FOR CALL**this is important as we prioritize the call backs  [place] AS LONG AS YOU CALL BEFORE 4:00 PM  At the [facility], you and your health needs are our priority. As part of our continuing mission to provide you with exceptional heart care, we have created designated Provider Care Teams. These Care Teams include your primary Cardiologist (physician) and Advanced Practice Providers (APPs- Physician Assistants and Nurse Practitioners) who all work together to provide you with the care you need, when you need it.   You may see any of the following providers on your designated Care Team at your next follow up: Marland Kitchen Dr Glori Bickers . Dr Loralie Champagne . Darrick Grinder, NP . Lyda Jester, PA . Audry Riles, PharmD   Please be sure to bring in all your medications bottles to every appointment.

## 2020-07-08 NOTE — Progress Notes (Signed)
Medication Samples have been provided to the patient.  Drug name: Eliquis       Strength: 5mg         Qty: 1 box (14 tablets)  LOT: [id number]  Exp.Date: 8/23  Dosing instructions: 1 tablet twice daily  The patient has been instructed regarding the correct time, dose, and frequency of taking this medication, including desired effects and most common side effects.   Park Pope Lonnie Reth 4:07 PM 07/08/2020

## 2020-07-16 ENCOUNTER — Other Ambulatory Visit: Payer: Self-pay

## 2020-07-16 ENCOUNTER — Ambulatory Visit (HOSPITAL_COMMUNITY)
Admission: RE | Admit: 2020-07-16 | Discharge: 2020-07-16 | Disposition: A | Discharge status: Home / Self Care | Payer: Self-pay | Source: Ambulatory Visit | Attending: Internal Medicine | Admitting: Internal Medicine

## 2020-07-16 DIAGNOSIS — I5022 Chronic systolic (congestive) heart failure: Secondary | ICD-10-CM | POA: Insufficient documentation

## 2020-07-16 LAB — BASIC METABOLIC PANEL
Anion gap: 9 (ref 5–15)
BUN: 11 mg/dL (ref 6–20)
CO2: 25 mmol/L (ref 22–32)
Calcium: 9.3 mg/dL (ref 8.9–10.3)
Chloride: 107 mmol/L (ref 98–111)
Creatinine, Ser: 0.79 mg/dL (ref 0.44–1.00)
GFR, Estimated: >60 mL/min (ref >60)
Glucose, Bld: 73 mg/dL (ref 70–99)
Potassium: 4.1 mmol/L (ref 3.5–5.1)
Sodium: 141 mmol/L (ref 135–145)

## 2020-07-16 NOTE — Addendum Note (Signed)
Encounter addended by: Kerry Dory, CMA on: 07/16/2020 11:33 AM  Actions taken: Clinical Note Signed

## 2020-07-16 NOTE — Progress Notes (Signed)
Medication Samples have been provided to the patient.  Drug name: eliquis       Strength: 5mg         Qty: 56  LOT: [id number]  Exp.Date: 03/2022  Dosing instructions: one tab twice a day  The patient has been instructed regarding the correct time, dose, and frequency of taking this medication, including desired effects and most common side effects.   Kerry Dory 11:32 AM 07/16/2020

## 2020-07-22 ENCOUNTER — Telehealth (HOSPITAL_COMMUNITY): Payer: Self-pay | Admitting: *Deleted

## 2020-07-22 NOTE — Telephone Encounter (Signed)
pts grand daughter left VM stating since starting new medications mexiletine and spironolactone pt has complained of fast heart beat and shortness of breath. I called back and spoke with patients grand daughter she said they did not check her rate with a pulse ox but pt can feel her heart beating too fast and wants to know if she can decrease or stop one of her medications.  Routed to [place]

## 2020-07-23 ENCOUNTER — Ambulatory Visit (HOSPITAL_COMMUNITY)
Admission: RE | Admit: 2020-07-23 | Discharge: 2020-07-23 | Disposition: A | Discharge status: Home / Self Care | Payer: Self-pay | Source: Ambulatory Visit | Attending: Internal Medicine | Admitting: Internal Medicine

## 2020-07-23 ENCOUNTER — Other Ambulatory Visit: Payer: Self-pay

## 2020-07-23 DIAGNOSIS — I480 Paroxysmal atrial fibrillation: Secondary | ICD-10-CM | POA: Insufficient documentation

## 2020-07-23 DIAGNOSIS — I5022 Chronic systolic (congestive) heart failure: Secondary | ICD-10-CM | POA: Insufficient documentation

## 2020-07-23 NOTE — Telephone Encounter (Signed)
I worry about repeat AF. Can we bring her in for ECG?

## 2020-07-23 NOTE — Telephone Encounter (Signed)
Pt scheduled for ecg at 11:30am this morning.

## 2020-08-07 ENCOUNTER — Other Ambulatory Visit: Payer: Self-pay | Admitting: Pharmacist

## 2020-08-07 DIAGNOSIS — M810 Age-related osteoporosis without current pathological fracture: Secondary | ICD-10-CM

## 2020-08-18 ENCOUNTER — Telehealth (HOSPITAL_COMMUNITY): Payer: Self-pay

## 2020-08-18 NOTE — Telephone Encounter (Signed)
Patients granddaughter called stating that the patient was out of her eliquis. Samples left up front for patient pick up   Medication Samples have been provided to the patient.  Drug name: Eliquis        Strength: 5mg         Qty: 4  LOT: [id number]  Exp.Date: 04-02-22,11/2021  Dosing instructions: Take 1 tab po bid  The patient has been instructed regarding the correct time, dose, and frequency of taking this medication, including desired effects and most common side effects.   Jahrel Borthwick R Alyse Kathan 3:40 PM 08/18/2020

## 2020-08-29 ENCOUNTER — Telehealth (HOSPITAL_COMMUNITY): Payer: Self-pay | Admitting: Pharmacy Technician

## 2020-08-29 NOTE — Telephone Encounter (Signed)
I have spoken with the patient's granddaughter several times and she has expressed concern about the status of her grandmother's BMS application. Stated that the provider's portion was completed incorrectly. I am not sure who helped her with this. I was able to send in another completed provider BMS application.   A month of samples will be at the check in desk. With what she picked up on December 27,2021 she should be set until February. I explained to her that during this time of year it takes a lot longer to be approved, as it is re-enrollment season for the company.  I advised her to call me when she had 4-5 days left of Eliquis if she has not received an approval and we would provide more samples, if able.

## 2020-09-05 ENCOUNTER — Other Ambulatory Visit (HOSPITAL_COMMUNITY): Payer: Self-pay | Admitting: *Deleted

## 2020-09-08 ENCOUNTER — Encounter (HOSPITAL_COMMUNITY): Payer: Self-pay

## 2020-09-08 MED ORDER — AMIODARONE HCL 200 MG PO TABS: ORAL_TABLET | ORAL | 6 refills | Status: DC

## 2020-09-08 MED ORDER — MEXILETINE HCL 200 MG PO CAPS: 200.0000 mg | ORAL_CAPSULE | Freq: Two times a day (BID) | ORAL | 6 refills | Status: DC

## 2020-09-08 MED ORDER — APIXABAN 5 MG PO TABS
5.0000 mg | ORAL_TABLET | Freq: Two times a day (BID) | ORAL | 6 refills | Status: DC
Start: 2020-09-08 — End: 2021-06-05

## 2020-09-08 MED ORDER — CARVEDILOL 3.125 MG PO TABS
3.1250 mg | ORAL_TABLET | Freq: Two times a day (BID) | ORAL | 6 refills | Status: DC
Start: 2020-09-08 — End: 2021-09-02

## 2020-09-08 MED ORDER — FUROSEMIDE 40 MG PO TABS
40.0000 mg | ORAL_TABLET | Freq: Every day | ORAL | 6 refills | Status: DC
Start: 2020-09-08 — End: 2021-03-12

## 2020-09-08 MED ORDER — SPIRONOLACTONE 25 MG PO TABS
12.5000 mg | ORAL_TABLET | Freq: Every day | ORAL | 6 refills | Status: DC
Start: 2020-09-08 — End: 2020-10-14

## 2020-09-15 NOTE — Progress Notes (Addendum)
Advanced Heart Failure Clinic Note   PCP: Kerin Perna, NP HF Cardiologist: Glori Bickers, MD  EP: Dr. 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   She presented to [facility] on 04/13[facility]17 with neck pain, SOB and orthopnea. Last EF prior to that reported by Dr. [name] was 35%. Repeat echo showed reduced EF of 15%, she was transferred to [facility] for further evaluation. Underwent right and left heart catheterization on 04/15/17, no CAD. Fick output/index 5.28/3.34. Full results below.   Noted to have >20% PVC on telemetry. She was started on IV Amio and transitioned to po Amio. Also started on digoxin and spiro prior to discharge. BP was too soft to add any other medications. Discharge weight was 143 pounds.   Admitted 7/13-18/2020 with ADHF in setting of new-onset AF in the setting of hyperthyroidism. TEE with EF 10% RV ok. Started on amiodarone and diuresed. Underwent TEE/DC-CV and felt much better. Also treated with methimazole and prednisone.   Seen again 04/2019.Weightwasup a few pounds. Zio patch placed to monitor PVC burden.   Zio patch 8/20 - NSR avg HR 75 - 13 runs NSVT longest 14 beats - 59 runs SVT longest 9.4 seconds - PVC burden 4.8%  Admitted10/20 for COVID 19 infection c/b recurrent afib w/ RVR. Was loaded on IV amiodaroneand rate improved but did not convert back to NSR. Was placed on Eliquis and discharged home on PO digoxin and metoprolol. Was advised to f/u in [facility] for further management.   Was seen in AHF clinic 07/03/19 for f/u with rapid AF in the 150s. She was volume overloaded. She was directly admitted to [facility] for further management  Was placed on IV amiodarone for afib. Eliquis resumed. TSH low normal, 0.354. Free T3 WNL 3.7. Free elevated T4 1.35. Methimazole and  prednisone continued for hyperthyroidism.  EP was consulted. Initial plans were for PVI ablation but procedure canceled after TEE showed severely reduced EF at 5% and severe LAE. Pt underwent DCCV instead. Converted to sinus tach. After IV amiodarone load, was switched to oral. She improved post DC-CV. Co-ox was monitored and remained stable, 60-65%. No IV inotrope requirements. She was also treated w/ Losartan (BP too soft for Entesto), spironolactone and digoxin. Overall not felt to be a great candidate for advanced therapies, pharmacist did thorough home med review and she appears to be quite noncompliant. Discharged 07/10/19.  Saw Dr. Rayann Heman on 10/22/19 and was still in NSR. Discussed possible AF ablation. There was concern over degree of MR and LAE.   Echo 2/1 EF 30-35% moderate MR. Personally reviewed  Echo 01/10/20 EF 25-30%    She finished treatment for hyperthyroidism with Dr. [name] 5/21.    Admitted 04/2020 to [facility] for dyspnea. Records are not available for review, but report provided by patient and her granddaughter. She was reportedly treated for mild hemoptysis in setting of PNA and CHF. Treated a/ abx and lasix. Appears to have had a chest CT Had low BP and was taken off losartan and Coreg. She was discharged home on PO diuretics but only taking lasix 2 days a week, MWF.   She returned 11/21 for HF follow up with her granddaughter. Last visit Coreg was restarted. Recent admission to [place] over the weekend with c/o hemoptysis. Reported CT chest was OK, symptoms have  resolved for past 3 days. EF going back down with PVCs unsuppressed. Mexitil 200 mg bid & spiro started this visit.  Today she returns for HF follow up with her granddaughter. Overall feeling fine. Feeling dizzy for about a year but getting better; when she walks she feels like she is "seeing stars." Has seen her PCP for this. SOB with walking longer distances, this is her baseline.  No longer feeling  palpitations. Denies increasing SOB, CP, dizziness, or PND/Orthopnea. Feels some swelling in abdomen. Appetite good. Says she is drinking <2L fluids/daily, but eating more. No fever or chills. Weight at home ~144-146 pounds. Taking all medications. Has not needed extra lasix. Weight up 6 lbs from last visit.  Cardiac Studies  Echo 03/2017 EF 10-15% Echo 07/2017 EF 25-30%  Echo 11/2017 EF 25-30% Echo 06/2018: EF ~30% (per Dr Haroldine Laws) Echo 8/20 EF 25-30% TEE 11/20 EF 5-10% Echo 5/21 EF 25-30%. RV normal.  Echo 11/21 EF 20-25% RV normal.  R/LHC 8/ 018 LHC - normal cors  Right Heart RA (mean): 5 mmHg RV (S/EDP): 52/4 mmHg PA (S/D, mean): 52/20 (31) mmHg PCWP (mean): 30 mmHg  Ao sat: 96% PA sat: 71% RA sat: 74%  Fick CO: 5.3 L/min Fick CI: 3.3 L/min/m^2  SVR: 1,101 dynscm-5  Zio patch 8/20 - NSR avg HR 75 - 13 runs NSVT longest 14 beats - 59 runs SVT longest 9.4 seconds - PVC burden 4.8%  Past Medical History:  Diagnosis Date   A-fib (HCC)    Abnormal TSH    Anemia    Arthritis    "knees, hands" (04/15/2017)   Breast cancer (HCC)    CHF (congestive heart failure) (HCC)    Chronic knee pain    Chronic systolic CHF (congestive heart failure) ([place])    COVID-19    Does not have health insurance    Frequent PVCs    Hyperthyroidism 03/05/2019   Hyperthyroidism 03/05/2019   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 ? PVC cardiomyopathy. right and left heart catheterization on 04/15/17, no CAD. Fick output/index 5.28/3.34.   Noncompliance    a. episodic noncompliance.   Pneumonia 06/2016    Current Outpatient Medications  Medication Sig Dispense Refill   albuterol (VENTOLIN HFA) 108 (90 Base) MCG/ACT inhaler Inhale 2 puffs into the lungs every 4 (four) hours as needed for wheezing or shortness of breath.      anastrozole (ARIMIDEX) 1 MG tablet Take 1 mg by mouth daily.     apixaban  (ELIQUIS) 5 MG TABS tablet Take 1 tablet (5 mg total) by mouth 2 (two) times daily. 60 tablet 6   carvedilol (COREG) 3.125 MG tablet Take 1 tablet (3.125 mg total) by mouth 2 (two) times daily. 60 tablet 6   cefdinir (OMNICEF) 300 MG capsule Take 300 mg by mouth 2 (two) times daily.     dapagliflozin propanediol (FARXIGA) 10 MG TABS tablet Take 1 tablet (10 mg total) by mouth daily before breakfast. 30 tablet 11   furosemide (LASIX) 40 MG tablet Take 1 tablet (40 mg total) by mouth daily. 30 tablet 6   methimazole (TAPAZOLE) 5 MG tablet Take 5 mg by mouth daily.     mexiletine (MEXITIL) 200 MG capsule Take 1 capsule (200 mg total) by mouth 2 (two) times daily. 60 capsule 6   Specialty Vitamins Products (ONE-A-DAY BONE STRENGTH PO) Take 1 tablet by mouth daily.     spironolactone (ALDACTONE) 25  MG tablet Take 0.5 tablets (12.5 mg total) by mouth daily. 15 tablet 6   amiodarone (PACERONE) 100 MG tablet Take 1 tablet (100 mg total) by mouth daily. TOMAR 1 TABLETA DIARIA (TAKE ONE TABLET EVERY DAY) 30 tablet 3   No current facility-administered medications for this encounter.    No Known Allergies    Social History   Socioeconomic History   Marital status: Single    Spouse name: Not on file   Number of children: Not on file   Years of education: Not on file   Highest education level: Not on file  Occupational History   Not on file  Tobacco Use   Smoking status: Never Smoker   Smokeless tobacco: Never Used  Vaping Use   Vaping Use: Never used  Substance and Sexual Activity   Alcohol use: Not Currently    Comment: 04/15/2017 "might have a couple beers q couple months"   Drug use: Not Currently   Sexual activity: Not Currently  Other Topics Concern   Not on file  Social History Narrative   ** Merged History Encounter **       N/A   Social Determinants of Health   Financial Resource Strain: High Risk   Difficulty of Paying Living Expenses: Very hard  Food  Insecurity: Not on file  Transportation Needs: Not on file  Physical Activity: Not on file  Stress: Not on file  Social Connections: Not on file  Intimate Partner Violence: Not on file     Family History  Problem Relation Age of Onset   Hypertension Mother     Vitals:   09/16/20 0948  BP: 115/73  Pulse: 90  SpO2: 96%  Weight: 67 kg (147 lb 9.6 oz)   Wt Readings from Last 3 Encounters:  09/16/20 67 kg (147 lb 9.6 oz)  07/08/20 64.1 kg (141 lb 6.4 oz)  05/29/20 65.1 kg (143 lb 9.6 oz)   ReDs: 41% ECG: NSR 88 bpm, no PVCs (personally reviewed).  *Spanish Interpreter Present*  PHYSICAL EXAM: General:  NAD. No resp difficulty HEENT: Normal Neck: Supple. JVP 7-8. Carotids 2+ bilat; no bruits. No lymphadenopathy or thryomegaly appreciated. Cor: PMI nondisplaced. Regular rate & rhythm. No rubs, gallops or murmurs. Lungs: Clear Abdomen:  Obese, soft, nontender, nondistended. No hepatosplenomegaly. No bruits or masses. Good bowel sounds. Extremities: No cyanosis, clubbing, rash, trace lower extremity edema Neuro: Alert & oriented x 3, cranial nerves grossly intact. Moves all 4 extremities w/o difficulty. Affect pleasant.  ASSESSMENT & PLAN:  1. Afib, persistent:  - s/p previous DC-CV in 7/20. 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  Previously was on amio for PVCs but developed hyperthyroidism, now being treated w/ methimazole. Admitted 06/2019 for persistent afib w/ RVR + a/c systolic CHF. Loaded w/ IV amiodarone and evaluated by EP. Initial plans were for PVI ablation but procedure canceled after TEE showed severely reduced EF at 5% and severe LAE. Pt underwent DCCV instead.  - NSR today, no PVCs on EKG. - Decrease amiodarone to 100 mg daily. TFTs and HFTs checked 8/21 and ok. 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 Completeted rx in 5/21. - Continue Eliquis 5 mg bid. Denies abnormal bleeding.  - Has seen Dr. Rayann Heman in 2/21. Planned for AF ablation but  does not have insurance coverage for AF ablation. Continue medical treatment. - Continue Mexiletine 200 mg bid.  - Consider stopped amio at next appt is PVCs adequately suppressed.  2.Chronic systolic [id number], normal cors in  03/2017. 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 03/2017 EF 15-20% Echo 07/28/17 EF 25-30%  - Echo 06/2018 EF ~30% - TEE 11/20 EF 5% in setting of AF - Echo 2/21 EF 30-35% - Echo 5/21 EF 25-30%. RV ok  - Echo 07/08/20 EF 20-25% RV ok - NYHA II-III. Volume up today, ReDs 41%, in setting of dietary indiscretion. - Increase lasix to 40 mg bid + 40 mEq KCl x 3 days, then back to home dose of Lasix to 40 mg daily + 20 mEq KCl daily. - Start Farxiga 10 mg daily. - Continue Coreg 3.125 mg bid.   - Continue spiro 12.5 mg daily. - Consider addition losartan at next visit. - Off digoxin due to intolerance. - BP historically been too soft for Entresto. - Check BMET today & repeat in 7-10 days. - EF seems to be going back down in setting of recurrent PVCs.  - Have not proceeded with ICD given non-compliance and hope that EF would improve with rhythm management. Will reassess after PVCs better suppressed. - BMET & BNP today, repeat BMET in 3 weeks.  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 Free T3 WNL 80.  Free elevated T4 normal 9.8. - Still on methimazole. Follows with Dr. Monna Fam. - Continue amio despite amio-induced hyperthyroidism; may be able to come off amio if success with added mexiletine. - Decrease amio as above.  - 4.H/oFrequent PVCs - Zio patch8/20with 5% PVC burden; Zio patch (3 days) 11/21 with 16% PVC burden. - No PVCs on ECG today. - Amio as above. - Mexiletine as above.  5. Left Breast Cancer. -Treated w/XRT.  6. H/O Hemoptysis - No further symptoms, resolved. - CT chest @ [facility] WNL (per patient). - If recurs will need f/u with Pulmonary.  6. Medication noncompliance - Discussed medications with granddaughter &  interpretor. She seems to be taking all medications.   Nurse visit next week with ReDs clip. Strict red flag instructions regarding SOB, weight gain and when to call clinic. Follow up in 4 weeks for further medication titration.  [place], FNP 09/16/20

## 2020-09-16 ENCOUNTER — Other Ambulatory Visit: Payer: Self-pay

## 2020-09-16 ENCOUNTER — Ambulatory Visit (HOSPITAL_COMMUNITY)
Admission: RE | Admit: 2020-09-16 | Discharge: 2020-09-16 | Disposition: A | Discharge status: Home / Self Care | Payer: Self-pay | Source: Ambulatory Visit | Attending: Family Medicine | Admitting: Family Medicine

## 2020-09-16 ENCOUNTER — Encounter (HOSPITAL_COMMUNITY): Payer: Self-pay

## 2020-09-16 VITALS — BP 115/73 | HR 90 | Wt 147.6 lb

## 2020-09-16 DIAGNOSIS — Z8616 Personal history of COVID-19: Secondary | ICD-10-CM | POA: Insufficient documentation

## 2020-09-16 DIAGNOSIS — I5022 Chronic systolic (congestive) heart failure: Secondary | ICD-10-CM | POA: Insufficient documentation

## 2020-09-16 DIAGNOSIS — E0590 Thyrotoxicosis, unspecified without thyrotoxic crisis or storm: Secondary | ICD-10-CM | POA: Insufficient documentation

## 2020-09-16 DIAGNOSIS — I110 Hypertensive heart disease with heart failure: Secondary | ICD-10-CM | POA: Insufficient documentation

## 2020-09-16 DIAGNOSIS — Z8249 Family history of ischemic heart disease and other diseases of the circulatory system: Secondary | ICD-10-CM | POA: Insufficient documentation

## 2020-09-16 DIAGNOSIS — I4819 Other persistent atrial fibrillation: Secondary | ICD-10-CM | POA: Insufficient documentation

## 2020-09-16 DIAGNOSIS — E0580 Other thyrotoxicosis without thyrotoxic crisis or storm: Secondary | ICD-10-CM

## 2020-09-16 DIAGNOSIS — Z87898 Personal history of other specified conditions: Secondary | ICD-10-CM

## 2020-09-16 DIAGNOSIS — C50912 Malignant neoplasm of unspecified site of left female breast: Secondary | ICD-10-CM | POA: Insufficient documentation

## 2020-09-16 DIAGNOSIS — Z7901 Long term (current) use of anticoagulants: Secondary | ICD-10-CM | POA: Insufficient documentation

## 2020-09-16 DIAGNOSIS — I428 Other cardiomyopathies: Secondary | ICD-10-CM | POA: Insufficient documentation

## 2020-09-16 DIAGNOSIS — Z9114 Patient's other noncompliance with medication regimen: Secondary | ICD-10-CM

## 2020-09-16 DIAGNOSIS — Z853 Personal history of malignant neoplasm of breast: Secondary | ICD-10-CM

## 2020-09-16 DIAGNOSIS — Z8709 Personal history of other diseases of the respiratory system: Secondary | ICD-10-CM

## 2020-09-16 DIAGNOSIS — I493 Ventricular premature depolarization: Secondary | ICD-10-CM | POA: Insufficient documentation

## 2020-09-16 DIAGNOSIS — Z79899 Other long term (current) drug therapy: Secondary | ICD-10-CM | POA: Insufficient documentation

## 2020-09-16 DIAGNOSIS — Z7984 Long term (current) use of oral hypoglycemic drugs: Secondary | ICD-10-CM | POA: Insufficient documentation

## 2020-09-16 DIAGNOSIS — I471 Supraventricular tachycardia: Secondary | ICD-10-CM | POA: Insufficient documentation

## 2020-09-16 LAB — BASIC METABOLIC PANEL
Anion gap: 8 (ref 5–15)
BUN: 20 mg/dL (ref 6–20)
CO2: 22 mmol/L (ref 22–32)
Calcium: 8.9 mg/dL (ref 8.9–10.3)
Chloride: 108 mmol/L (ref 98–111)
Creatinine, Ser: 0.81 mg/dL (ref 0.44–1.00)
GFR, Estimated: >60 mL/min (ref >60)
Glucose, Bld: 109 mg/dL — ABNORMAL HIGH (ref 70–99)
Potassium: 4.2 mmol/L (ref 3.5–5.1)
Sodium: 138 mmol/L (ref 135–145)

## 2020-09-16 MED ORDER — AMIODARONE HCL 100 MG PO TABS
100.0000 mg | ORAL_TABLET | Freq: Every day | ORAL | 3 refills | Status: DC
Start: 2020-09-16 — End: 2020-10-14

## 2020-09-16 MED ORDER — DAPAGLIFLOZIN PROPANEDIOL 10 MG PO TABS: 10.0000 mg | ORAL_TABLET | Freq: Every day | ORAL | 11 refills | Status: DC

## 2020-09-16 NOTE — Patient Instructions (Addendum)
  AUMENTE Lasix a 40 mg [place] 3 das, luego reanude la dosis normal de 40 mg una vez al da a partir de entonces AUMENTAR el potasio a 40 meq diarios durante 3 das, luego reanudar la dosis normal de 20 meq diarios a Renato Gails de entonces COMENZAR Farxiga 10 mg, una tableta al da DISMINUYA Amiodarona a 100 mg, una pestaa al da  laboratorios hoy Solo nos comunicaremos con usted si algo sale anormal o si necesitamos hacer algunos cambios. De lo contrario, ninguna noticia es una buena noticia.  Laboratorios necesarios en 7-10 das con una visita de enfermera  Su mdico le recomienda programar una cita de seguimiento en: 4 a 6 semanas en la [facility] (PA/NP)   Haz lo siguiente TODOS LOS DAS: Psate por la maana antes del desayuno. Antelo y gurdelo en un registro. Tome sus medicamentos segn lo prescrito Coma alimentos bajos en sal: limite la sal (sodio) a 2000 mg por da. Mantngase tan activo como pueda todos los das Limite todos los lquidos del da a menos de 2 litros

## 2020-09-16 NOTE — Progress Notes (Signed)
ReDS Vest / Clip - 09/16/20 1000      ReDS Vest / Clip   Station Marker A    Ruler Value 33    ReDS Value Range High volume overload    ReDS Actual Value 41

## 2020-09-17 NOTE — Telephone Encounter (Signed)
Advanced Heart Failure Patient Advocate Encounter   Patient was approved to receive Eliquis from [employer]  Effective dates: 09/05/20 through 09/04/21  The first shipment was delivered to the patient on 1/20.   Granddaughter called today and left a message, I could not tell what the message was about. I called her granddaughter back and also left her a message to follow up with me.  Charlann Boxer, CPhT

## 2020-09-18 ENCOUNTER — Telehealth (HOSPITAL_COMMUNITY): Payer: Self-pay | Admitting: *Deleted

## 2020-09-18 NOTE — Telephone Encounter (Signed)
pts granddaughter called to report that since increasing medication pt has a bad headache and 2 hours ago pt started coughing up small blood clots. Pt also complains of chest pain Per Amy Clegg,NPc patient needs to be evaluated in the emergency room. Granddaughter aware and will take patient to the emergency room.

## 2020-09-26 ENCOUNTER — Encounter (HOSPITAL_COMMUNITY): Payer: Self-pay

## 2020-09-29 ENCOUNTER — Other Ambulatory Visit: Payer: Self-pay

## 2020-09-29 ENCOUNTER — Ambulatory Visit (HOSPITAL_COMMUNITY)
Admission: RE | Admit: 2020-09-29 | Discharge: 2020-09-29 | Disposition: A | Discharge status: Home / Self Care | Payer: Self-pay | Source: Ambulatory Visit | Attending: Family Medicine | Admitting: Family Medicine

## 2020-09-29 ENCOUNTER — Other Ambulatory Visit (HOSPITAL_COMMUNITY): Payer: Self-pay | Admitting: *Deleted

## 2020-09-29 DIAGNOSIS — I420 Dilated cardiomyopathy: Secondary | ICD-10-CM | POA: Insufficient documentation

## 2020-09-29 LAB — BASIC METABOLIC PANEL
Anion gap: 9 (ref 5–15)
BUN: 9 mg/dL (ref 6–20)
CO2: 23 mmol/L (ref 22–32)
Calcium: 9.2 mg/dL (ref 8.9–10.3)
Chloride: 107 mmol/L (ref 98–111)
Creatinine, Ser: 0.77 mg/dL (ref 0.44–1.00)
GFR, Estimated: >60 mL/min (ref >60)
Glucose, Bld: 84 mg/dL (ref 70–99)
Potassium: 4.3 mmol/L (ref 3.5–5.1)
Sodium: 139 mmol/L (ref 135–145)

## 2020-10-13 NOTE — Progress Notes (Signed)
Advanced Heart Failure Clinic Note   PCP: Kerin Perna, NP HF Cardiologist: Glori Bickers, MD  EP: Dr. 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    She presented to [facility] on 04/13/17 with neck pain, SOB and orthopnea. Last EF prior to that reported by Dr. [name] was 35%. Repeat echo showed reduced EF of 15%, she was transferred to [facility] for further evaluation. Underwent right and left heart catheterization on 04/15/17, no CAD. Fick output/index 5.28/3.34.Noted to have >20% PVC on telemetry. She was started on IV Amio and transitioned to po Amio. Discharge weight was 143 pounds.   Admitted 7/13-18/2020 with ADHF in setting of new-onset AF in the setting of hyperthyroidism. TEE with EF 10% RV ok. Started on amiodarone and diuresed. Underwent TEE/DC-CV and felt much better. Also treated with methimazole and prednisone.   Seen again 04/2019.Weightwasup a few pounds. Zio patch placed to monitor PVC burden.   Zio patch 8/20 - NSR avg HR 75 - 13 runs NSVT longest 14 beats - 59 runs SVT longest 9.4 seconds - PVC burden 4.8%  Admitted10/20 for COVID 19 infection c/b recurrent afib w/ RVR. Was loaded on IV amiodaroneand rate improved but did not convert back to NSR. Was placed on Eliquis and discharged home on PO digoxin and metoprolol. Was advised to f/u in [facility] for further management.   Admitted from  AHF clinic 07/03/19 with A Fib RVR + A/C systolic heart failure.   Methimazole and prednisone continued for hyperthyroidism.  EP was consulted. Initial plans were for PVI ablation but procedure canceled after TEE showed severely reduced EF at 5% and severe LAE. Had DC-CV with conversion to NSR.  She was also treated w/ Losartan (BP too soft for Entesto), spironolactone and  digoxin. Overall not felt to be a great candidate for advanced therapies, pharmacist did thorough home med review and she appears to be quite noncompliant. Discharged 07/10/19.  Saw Dr. Rayann Heman on 10/22/19 and was still in NSR. Discussed possible AF ablation. There was concern over degree of MR and LAE.   She finished treatment for hyperthyroidism with Dr. [name] 5/21.    Admitted 04/2020 to [facility] for dyspnea. She was reportedly treated for mild hemoptysis in setting of PNA and CHF. Treated a/ abx and lasix. Appears to have had a chest CT Had low BP and was taken off losartan and Coreg. She was discharged home on PO diuretics but only taking lasix 2 days a week, MWF.   She returned 06/2020 for HF follow up. High PVC burden so Mexitil 200 mg bid started. [name] was also started.   Today she returns for HF follow up with granddaughter. Last visit farxiga started. Overall feeling fine. Has had some r hand pain but says it is getting better. Able to do more around the house. Denies SOB/PND/Orthopnea. Appetite ok. No fever or chills. Weight at home has been stable.  Taking all medications   Cardiac Studies  Echo 03/2017 EF 10-15% Echo 07/2017 EF 25-30%  Echo 11/2017 EF 25-30% Echo 06/2018: EF ~30% (per Dr Haroldine Laws) Echo 8/20 EF 25-30% TEE 11/20 EF 5-10% Echo 2/1 EF 30-35% moderate MR Echo 5/21 EF 25-30%. RV normal.  Echo 11/21 EF 20-25% RV normal.  R/LHC 8/ 018 LHC - normal cors  Right Heart RA (mean):  5 mmHg RV (S/EDP): 52/4 mmHg PA (S/D, mean): 52/20 (31) mmHg PCWP (mean): 30 mmHg  Ao sat: 96% PA sat: 71% RA sat: 74%  Fick CO: 5.3 L/min Fick CI: 3.3 L/min/m^2  SVR: 1,101 dynscm-5  Zio patch 8/20 - NSR avg HR 75 - 13 runs NSVT longest 14 beats - 59 runs SVT longest 9.4 seconds - PVC burden 4.8%  Past Medical History:  Diagnosis Date  . A-fib ([place])   . Abnormal TSH   . Anemia   . Arthritis    "knees, hands" (04/15/2017)  . Breast cancer ([place])   . CHF  (congestive heart failure) ([place])   . Chronic knee pain   . Chronic systolic CHF (congestive heart failure) ([place])   . COVID-19   . Does not have health insurance   . Frequent PVCs   . Hyperthyroidism 03/05/2019  . Hyperthyroidism 03/05/2019  . 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 NICM (nonischemic cardiomyopathy) ([place])    a. ? PVC cardiomyopathy. right and left heart catheterization on 04/15/17, no CAD. Fick output/index 5.28/3.34.  . Noncompliance    a. episodic noncompliance.  . Pneumonia 06/2016    Current Outpatient Medications  Medication Sig Dispense Refill  . amiodarone (PACERONE) 200 MG tablet Take 200 mg by mouth daily.    Marland Kitchen anastrozole (ARIMIDEX) 1 MG tablet Take 1 mg by mouth daily.    Marland Kitchen apixaban (ELIQUIS) 5 MG TABS tablet Take 1 tablet (5 mg total) by mouth 2 (two) times daily. 60 tablet 6  . carvedilol (COREG) 3.125 MG tablet Take 1 tablet (3.125 mg total) by mouth 2 (two) times daily. 60 tablet 6  . cefdinir (OMNICEF) 300 MG capsule Take 300 mg by mouth 2 (two) times daily.    . furosemide (LASIX) 40 MG tablet Take 1 tablet (40 mg total) by mouth daily. 30 tablet 6  . mexiletine (MEXITIL) 200 MG capsule Take 1 capsule (200 mg total) by mouth 2 (two) times daily. 60 capsule 6  . Specialty Vitamins Products (ONE-A-DAY BONE STRENGTH PO) Take 1 tablet by mouth daily.    Marland Kitchen spironolactone (ALDACTONE) 25 MG tablet Take 0.5 tablets (12.5 mg total) by mouth daily. 15 tablet 6   No current facility-administered medications for this encounter.    No Known Allergies    Social History   Socioeconomic History  . Marital status: Single    Spouse name: Not on file  . Number of children: Not on file  . Years of education: Not on file  . Highest education level: Not on file  Occupational History  . Not on file  Tobacco Use  . Smoking status: Never Smoker  . Smokeless tobacco: Never Used  Vaping Use  . Vaping Use: Never used  Substance and Sexual Activity   . Alcohol use: Not Currently    Comment: 04/15/2017 "might have a couple beers q couple months"  . Drug use: Not Currently  . Sexual activity: Not Currently  Other Topics Concern  . Not on file  Social History Narrative   ** Merged History Encounter **       N/A   Social Determinants of Health   Financial Resource Strain: High Risk  . Difficulty of Paying Living Expenses: Very hard  Food Insecurity: Not on file  Transportation Needs: Not on file  Physical Activity: Not on file  Stress: Not on file  Social Connections: Not on file  Intimate Partner Violence: Not on file     Family  History  Problem Relation Age of Onset  . Hypertension Mother     Vitals:   10/14/20 1050  BP: 118/90  Pulse: 87  SpO2: 99%  Weight: 66.8 kg   Wt Readings from Last 3 Encounters:  10/14/20 66.8 kg  09/16/20 67 kg  07/08/20 64.1 kg   *Spanish Interpreter Present* [place]  PHYSICAL EXAM: General:  Well appearing. No resp difficulty. Walked at home.  HEENT: normal Neck: supple. no JVD. Carotids 2+ bilat; no bruits. No lymphadenopathy or thryomegaly appreciated. Cor: PMI nondisplaced. Regular rate & rhythm. No rubs, gallops or murmurs. Lungs: clear Abdomen: soft, nontender, nondistended. No hepatosplenomegaly. No bruits or masses. Good bowel sounds. Extremities: no cyanosis, clubbing, rash, edema.  Neuro: alert & orientedx3, cranial nerves grossly intact. moves all 4 extremities w/o difficulty. Affect pleasant  EKG: NSR 81 bpm QRS 94 ms  ASSESSMENT & PLAN:  1. PAF - s/p previous DC-CV in 7/20. 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  Previously was on amio for PVCs but developed hyperthyroidism, now being treated w/ methimazole. Admitted 06/2019 for persistent afib w/ RVR + a/c systolic CHF. Loaded w/ IV amiodarone and evaluated by EP. Initial plans were for PVI ablation but procedure canceled after TEE showed severely reduced EF at 5% and severe LAE. Pt underwent DCCV instead.  -  EKG today - NSR  - Continue Eliquis 5 mg bid. No bleeding issues. - Has seen Dr. Rayann Heman in 2/21. Planned for AF ablation but does not have insurance coverage for AF ablation. Continue medical treatment. - Continue Mexiletine 200 mg bid.  - cut back amio to 100 mg daily.  - Consider stopping at next visit.   2.Chronic systolic JYN:WGNF, normal cors in 03/2017. 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 03/2017 EF 15-20% Echo 07/28/17 EF 25-30%  - Echo 06/2018 EF ~30% - TEE 11/20 EF 5% in setting of AF - Echo 2/21 EF 30-35% - Echo 5/21 EF 25-30%. RV ok  - Echo 07/08/20 EF 20-25% RV ok - NYHA II. Volume status stable.  - Continue Farxiga 10 mg daily. - Continue Coreg 3.125 mg bid.   - Increase spiro to 25 mg daily.  - Consider addition losartan at next visit. BP historically been too soft for Entresto. - Off digoxin due to intolerance. - Repeat ECHO next visit.  - Have not proceeded with ICD given non-compliance and hope that EF would improve with rhythm management. PVC suppressed.   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   Free T3 T4 ok  - Completed treatment. . - Continue amio despite amio-induced hyperthyroidism; may be able to come off amio if success with added mexiletine.  - 4.H/oFrequent PVCs - Zio patch8/20with 5% PVC burden; Zio patch (3 days) 11/21 with 16% PVC burden. - Amio + Mexiletine as above. - No PVCs on EKG today.   5. Left Breast Cancer. -Treated w/XRT.  6. H/O Hemoptysis -No recent episodes  7. Medication noncompliance - Seems to be taking all meds.   Check BMET today and in 7 days.  Increase spiro to 25 mg daily Follow up with Dr Haroldine Laws in 2-3 months with an ECHO.  Discussed all medication changes with her granddaughter and the patient. Medications are listed on her bottles in [place].     Darrick Grinder, NP 10/14/20

## 2020-10-14 ENCOUNTER — Encounter (HOSPITAL_COMMUNITY): Payer: Self-pay

## 2020-10-14 ENCOUNTER — Other Ambulatory Visit: Payer: Self-pay

## 2020-10-14 ENCOUNTER — Ambulatory Visit (HOSPITAL_COMMUNITY)
Admission: RE | Admit: 2020-10-14 | Discharge: 2020-10-14 | Disposition: A | Discharge status: Home / Self Care | Payer: Self-pay | Source: Ambulatory Visit | Attending: Adult Health | Admitting: Adult Health

## 2020-10-14 VITALS — BP 118/90 | HR 87 | Wt 147.2 lb

## 2020-10-14 DIAGNOSIS — I5022 Chronic systolic (congestive) heart failure: Secondary | ICD-10-CM

## 2020-10-14 DIAGNOSIS — E0580 Other thyrotoxicosis without thyrotoxic crisis or storm: Secondary | ICD-10-CM

## 2020-10-14 DIAGNOSIS — I493 Ventricular premature depolarization: Secondary | ICD-10-CM

## 2020-10-14 DIAGNOSIS — E0590 Thyrotoxicosis, unspecified without thyrotoxic crisis or storm: Secondary | ICD-10-CM | POA: Insufficient documentation

## 2020-10-14 DIAGNOSIS — I4819 Other persistent atrial fibrillation: Secondary | ICD-10-CM | POA: Insufficient documentation

## 2020-10-14 DIAGNOSIS — I480 Paroxysmal atrial fibrillation: Secondary | ICD-10-CM

## 2020-10-14 DIAGNOSIS — Z79899 Other long term (current) drug therapy: Secondary | ICD-10-CM | POA: Insufficient documentation

## 2020-10-14 DIAGNOSIS — I471 Supraventricular tachycardia: Secondary | ICD-10-CM | POA: Insufficient documentation

## 2020-10-14 DIAGNOSIS — Z9114 Patient's other noncompliance with medication regimen: Secondary | ICD-10-CM | POA: Insufficient documentation

## 2020-10-14 DIAGNOSIS — I428 Other cardiomyopathies: Secondary | ICD-10-CM

## 2020-10-14 DIAGNOSIS — Z8616 Personal history of COVID-19: Secondary | ICD-10-CM | POA: Insufficient documentation

## 2020-10-14 DIAGNOSIS — Z7901 Long term (current) use of anticoagulants: Secondary | ICD-10-CM | POA: Insufficient documentation

## 2020-10-14 DIAGNOSIS — Z8249 Family history of ischemic heart disease and other diseases of the circulatory system: Secondary | ICD-10-CM | POA: Insufficient documentation

## 2020-10-14 DIAGNOSIS — C50912 Malignant neoplasm of unspecified site of left female breast: Secondary | ICD-10-CM | POA: Insufficient documentation

## 2020-10-14 LAB — BASIC METABOLIC PANEL
Anion gap: 9 (ref 5–15)
BUN: 12 mg/dL (ref 6–20)
CO2: 23 mmol/L (ref 22–32)
Calcium: 9.4 mg/dL (ref 8.9–10.3)
Chloride: 110 mmol/L (ref 98–111)
Creatinine, Ser: 0.78 mg/dL (ref 0.44–1.00)
GFR, Estimated: >60 mL/min (ref >60)
Glucose, Bld: 88 mg/dL (ref 70–99)
Potassium: 4.2 mmol/L (ref 3.5–5.1)
Sodium: 142 mmol/L (ref 135–145)

## 2020-10-14 MED ORDER — SPIRONOLACTONE 25 MG PO TABS
25.0000 mg | ORAL_TABLET | Freq: Every day | ORAL | 6 refills | Status: DC
Start: 2020-10-14 — End: 2021-10-27

## 2020-10-14 MED ORDER — AMIODARONE HCL 100 MG PO TABS
100.0000 mg | ORAL_TABLET | Freq: Every day | ORAL | 3 refills | Status: DC
Start: 2020-10-14 — End: 2021-04-22

## 2020-10-14 NOTE — Patient Instructions (Addendum)
INCREASE Spironolactone 25mg  (1 tablet) daily  DECREASE Amiodarone 100mg  (1 tablet) daily  Labs done today, your results will be available in MyChart, we will contact you for abnormal readings.  Your physician recommends that you schedule repeat labs in 1 week  Your physician recommends that you schedule a follow-up appointment in: 2 months with Dr. Haroldine Laws and an echocardiogram  Your physician has requested that you have an echocardiogram. Echocardiography is a painless test that uses sound waves to create images of your heart. It provides your doctor with information about the size and shape of your heart and how well your heart's chambers and valves are working. This procedure takes approximately one hour. There are no restrictions for this procedure.  If you have any questions or concerns before your next appointment please send [place] a message through [place] or call our office at [phone].    TO LEAVE A MESSAGE FOR THE NURSE SELECT OPTION 2, PLEASE LEAVE A MESSAGE INCLUDING: . YOUR NAME . DATE OF BIRTH . CALL BACK NUMBER . REASON FOR CALL**this is important as we prioritize the call backs  YOU WILL RECEIVE A CALL BACK THE SAME DAY AS LONG AS YOU CALL BEFORE 4:00 PM

## 2020-10-23 ENCOUNTER — Ambulatory Visit (HOSPITAL_COMMUNITY)
Admission: RE | Admit: 2020-10-23 | Discharge: 2020-10-23 | Disposition: A | Discharge status: Home / Self Care | Payer: Self-pay | Source: Ambulatory Visit | Attending: Internal Medicine | Admitting: Internal Medicine

## 2020-10-23 ENCOUNTER — Other Ambulatory Visit: Payer: Self-pay

## 2020-10-23 DIAGNOSIS — I5022 Chronic systolic (congestive) heart failure: Secondary | ICD-10-CM | POA: Insufficient documentation

## 2020-10-23 LAB — BASIC METABOLIC PANEL
Anion gap: 8 (ref 5–15)
BUN: 12 mg/dL (ref 6–20)
CO2: 23 mmol/L (ref 22–32)
Calcium: 9.5 mg/dL (ref 8.9–10.3)
Chloride: 109 mmol/L (ref 98–111)
Creatinine, Ser: 0.78 mg/dL (ref 0.44–1.00)
GFR, Estimated: >60 mL/min (ref >60)
Glucose, Bld: 80 mg/dL (ref 70–99)
Potassium: 4.9 mmol/L (ref 3.5–5.1)
Sodium: 140 mmol/L (ref 135–145)

## 2020-10-29 NOTE — Progress Notes (Signed)
[facility]  [address] [place],  [place]  [address] [phone]  Clinic Day:  10/30/2020  Referring physician: Kerin Perna, NP   HISTORY OF PRESENT ILLNESS:  The patient is a 60 y.o. 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  She also completed adjuvant breast radiation to prevent local disease recurrence.  Currently, the patient is taking anastrozole for her adjuvant hormonal management.  She comes in today routine follow-up.  Since her last visit, the patient has been doing okay.  She denies having any particular changes in her breasts which concern her for disease recurrence.  Of note, she is scheduled for her annual mammogram later this month.  On another note, the patient is scheduled to be seen at [facility] as a recent Pap smear apparently showed early cervical cancer.  She also believes she may have ovarian cancer, which has her very tearful.    PHYSICAL EXAM:  Blood pressure 121/77, pulse 98, temperature 98.6 F (37 C), resp. rate 16, height 4\' 11"  (1.499 m), weight 146 lb 14.4 oz (66.6 kg), SpO2 100 %. Wt Readings from Last 3 Encounters:  10/30/20 146 lb 14.4 oz (66.6 kg)  10/14/20 147 lb 3.2 oz (66.8 kg)  09/16/20 147 lb 9.6 oz (67 kg)   Body mass index is 29.67 kg/m. Performance status (ECOG): 1 - Symptomatic but completely ambulatory Physical Exam Constitutional:      Appearance: Normal appearance.  HENT:     Mouth/Throat:     Pharynx: Oropharynx is clear. No oropharyngeal exudate.  Cardiovascular:     Rate and Rhythm: Normal rate and regular rhythm.     Heart sounds: No murmur heard. No friction rub. No gallop.   Pulmonary:     Breath sounds: Normal breath sounds.  Chest:  Breasts:     Right: No swelling, bleeding, inverted nipple, mass, nipple discharge, skin change, axillary adenopathy or supraclavicular adenopathy.     Left: No swelling, bleeding, inverted  nipple, mass, nipple discharge, skin change, axillary adenopathy or supraclavicular adenopathy.    Abdominal:     General: Bowel sounds are normal. There is no distension.     Palpations: Abdomen is soft. There is no mass.     Tenderness: There is no abdominal tenderness.  Musculoskeletal:        General: No tenderness.     Cervical back: Normal range of motion and neck supple.     Right lower leg: No edema.     Left lower leg: No edema.  Lymphadenopathy:     Cervical: No cervical adenopathy.     Right cervical: No superficial, deep or posterior cervical adenopathy.    Left cervical: No superficial, deep or posterior cervical adenopathy.     Upper Body:     Right upper body: No supraclavicular or axillary adenopathy.     Left upper body: No supraclavicular or axillary adenopathy.     Lower Body: No right inguinal adenopathy. No left inguinal adenopathy.  Skin:    Coloration: Skin is not jaundiced.     Findings: No lesion or rash.  Neurological:     General: No focal deficit present.     Mental Status: She is alert and oriented to person, place, and time. Mental status is at baseline.  Psychiatric:        Mood and Affect: Mood normal.        Behavior: Behavior normal.  Thought Content: Thought content normal.        Judgment: Judgment normal.     LABS:   CBC Latest Ref Rng & Units 07/08/2020 05/20/2020 01/31/2020  WBC 4.0 - 10.5 K/uL 9.4 8.4 -  Hemoglobin 12.0 - 15.0 g/dL 13.5 12.4 12.2  Hematocrit 36.0 - 46.0 % 41.6 38.2 36.0  Platelets 150 - 400 K/uL 305 311 -   CMP Latest Ref Rng & Units 10/23/2020 10/14/2020 09/29/2020  Glucose 70 - 99 mg/dL 80 88 84  BUN 6 - 20 mg/dL 12 12 9   Creatinine 0.44 - 1.00 mg/dL 0.78 0.78 0.77  Sodium 135 - 145 mmol/L 140 142 139  Potassium 3.5 - 5.1 mmol/L 4.9 4.2 4.3  Chloride 98 - 111 mmol/L 109 110 107  CO2 22 - 32 mmol/L 23 23 23   Calcium 8.9 - 10.3 mg/dL 9.5 9.4 9.2  Total Protein 6.5 - 8.1 g/dL - - -  Total Bilirubin 0.3 - 1.2  mg/dL - - -  Alkaline Phos 38 - 126 U/L - - -  AST 15 - 41 U/L - - -  ALT 0 - 44 U/L - - -    STUDIES:  CT scans of her chest/abdomen/pelvis done in November 2021 revealed the following: FINDINGS: CTA CHEST FINDINGS  Cardiovascular: Cardiomegaly. No pericardial effusion. Early enhancement of the left heart and aorta without acute finding. No pulmonary artery filling defect.  Mediastinum/Nodes: Negative for adenopathy or mass  Lungs/Pleura: Interlobular septal thickening at the bases with fairly symmetric ground-glass opacity. Small pleural effusions.  Musculoskeletal: No acute finding  Review of the MIP images confirms the above findings.  CT ABDOMEN and PELVIS FINDINGS  Hepatobiliary: Pericholecystic cyst. No evidence of biliary obstruction or stone.  Pancreas: Unremarkable.  Spleen: Unremarkable.  Adrenals/Urinary Tract: Negative adrenals. No hydronephrosis or stone. Unremarkable bladder.  Stomach/Bowel:  No obstruction. No appendicitis.  Vascular/Lymphatic: No acute vascular abnormality. No mass or adenopathy.  Reproductive:Numerous uterine fibroids distorting the serosa and visible endometrium.  Other: No ascites or pneumoperitoneum.  Musculoskeletal: No acute abnormalities.  Review of the MIP images confirms the above findings.  IMPRESSION: 1. CHF pattern. 2. Negative for pulmonary embolism. 3. No acute intra-abdominal finding. 4. Fibroid uterus.   ASSESSMENT & PLAN:  Assessment/Plan:  A 60 y.o. female with stage IA (T31mi N0 M0) hormone positive breast cancer, status post a lumpectomy in April 2019.  Based upon her clinical breast exam today, the patient remains disease-free.  She knows to continue taking her anastrozole on a daily basis to complete 5 total years of adjuvant endocrine therapy.  Her annual mammogram is scheduled in the forthcoming weeks.  Most of this visit was spent showing the patient her fairly recent CT scans, for which only numerous  fibroids were seen.  There were no adnexal masses seen that were concerning for ovarian cancer.  I made sure the patient understood that it will be important for her to have someone go with her to [facility] to translate next week so that there will not be language barriers which may be obscuring the message she has been getting from physicians.  Otherwise, as she is doing well, I will her back in 6 months for a repeat clinical breast exam.  The patient understands all the plans discussed today and is in agreement with them.      Parsa Rickett Macarthur Critchley, MD

## 2020-10-30 ENCOUNTER — Inpatient Hospital Stay: Payer: Self-pay | Attending: Hematology and Oncology | Admitting: Oncology

## 2020-10-30 ENCOUNTER — Other Ambulatory Visit: Payer: Self-pay | Admitting: Pharmacist

## 2020-10-30 ENCOUNTER — Other Ambulatory Visit: Payer: Self-pay

## 2020-10-30 VITALS — BP 121/77 | HR 98 | Temp 98.6°F | Resp 16 | Ht 59.0 in | Wt 146.9 lb

## 2020-10-30 DIAGNOSIS — Z170 Estrogen receptor positive status [ER+]: Secondary | ICD-10-CM

## 2020-10-30 DIAGNOSIS — C50412 Malignant neoplasm of upper-outer quadrant of left female breast: Secondary | ICD-10-CM

## 2020-10-30 NOTE — Progress Notes (Signed)
Patient states that she was seen at [facility] for ovarian / uterine issue.
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
## 2020-11-28 ENCOUNTER — Telehealth (HOSPITAL_COMMUNITY): Payer: Self-pay | Admitting: *Deleted

## 2020-11-28 ENCOUNTER — Telehealth (HOSPITAL_COMMUNITY): Payer: Self-pay | Admitting: Internal Medicine

## 2020-11-28 NOTE — Telephone Encounter (Signed)
Ria Comment (a fellow a [facility]) called to report pt has possible cervical cancer and they need to perform a minor procedure and possible major procedures. She would like to speak with Dr.Bensimhon about plan and requested a call back. Dr.Bensimhon aware.  Call back # [phone]

## 2020-11-28 NOTE — Telephone Encounter (Signed)
Pt stated she has to set up appt for an echo, for surgery next Friday, please advise

## 2020-11-28 NOTE — Telephone Encounter (Signed)
Pts doctor called clinic and will consult with Dr.Bensimhon about plan before we schedule echo.

## 2020-12-01 ENCOUNTER — Other Ambulatory Visit (HOSPITAL_COMMUNITY): Payer: Self-pay | Admitting: *Deleted

## 2020-12-02 ENCOUNTER — Ambulatory Visit (HOSPITAL_COMMUNITY)
Admission: RE | Admit: 2020-12-02 | Discharge: 2020-12-02 | Disposition: A | Discharge status: Home / Self Care | Payer: Self-pay | Source: Ambulatory Visit | Attending: Adult Health | Admitting: Adult Health

## 2020-12-02 ENCOUNTER — Other Ambulatory Visit: Payer: Self-pay

## 2020-12-02 DIAGNOSIS — I429 Cardiomyopathy, unspecified: Secondary | ICD-10-CM | POA: Insufficient documentation

## 2020-12-02 DIAGNOSIS — I5022 Chronic systolic (congestive) heart failure: Secondary | ICD-10-CM | POA: Insufficient documentation

## 2020-12-02 DIAGNOSIS — I4891 Unspecified atrial fibrillation: Secondary | ICD-10-CM | POA: Insufficient documentation

## 2020-12-02 DIAGNOSIS — I071 Rheumatic tricuspid insufficiency: Secondary | ICD-10-CM | POA: Insufficient documentation

## 2020-12-02 LAB — ECHOCARDIOGRAM COMPLETE
Calc EF: 38.8 %
MV M vel: 4.06 m/s
MV Peak grad: 65.9 mmHg
Radius: 0.4 cm
S' Lateral: 4.4 cm
Single Plane A2C EF: 39.2 %
Single Plane A4C EF: 39.6 %

## 2020-12-02 NOTE — Progress Notes (Signed)
  Echocardiogram 2D Echocardiogram has been performed.  Megan Keith 12/02/2020, 10:54 AM

## 2020-12-31 ENCOUNTER — Ambulatory Visit (INDEPENDENT_AMBULATORY_CARE_PROVIDER_SITE_OTHER): Payer: Self-pay | Admitting: Internal Medicine

## 2020-12-31 ENCOUNTER — Encounter: Payer: Self-pay | Admitting: Internal Medicine

## 2020-12-31 ENCOUNTER — Other Ambulatory Visit: Payer: Self-pay

## 2020-12-31 VITALS — BP 138/88 | HR 93 | Ht 59.0 in | Wt 149.4 lb

## 2020-12-31 DIAGNOSIS — T462X5A Adverse effect of other antidysrhythmic drugs, initial encounter: Secondary | ICD-10-CM

## 2020-12-31 DIAGNOSIS — E064 Drug-induced thyroiditis: Secondary | ICD-10-CM

## 2020-12-31 LAB — T4, FREE: Free T4: 1.71 ng/dL — ABNORMAL HIGH (ref 0.60–1.60)

## 2020-12-31 LAB — TSH: TSH: 0.31 u[IU]/mL — ABNORMAL LOW (ref 0.35–4.50)

## 2020-12-31 LAB — T3, FREE: T3, Free: 3.1 pg/mL (ref 2.3–4.2)
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
## 2020-12-31 NOTE — Patient Instructions (Signed)
Please stop at the lab.  Please come back in 6 months. 

## 2021-01-01 ENCOUNTER — Encounter: Payer: Self-pay | Admitting: Internal Medicine

## 2021-01-07 ENCOUNTER — Other Ambulatory Visit: Payer: Self-pay

## 2021-01-07 ENCOUNTER — Other Ambulatory Visit (HOSPITAL_COMMUNITY): Payer: Self-pay | Admitting: Internal Medicine

## 2021-01-07 ENCOUNTER — Telehealth (HOSPITAL_COMMUNITY): Payer: Self-pay | Admitting: Pharmacy Technician

## 2021-01-07 ENCOUNTER — Ambulatory Visit (HOSPITAL_COMMUNITY)
Admission: RE | Admit: 2021-01-07 | Discharge: 2021-01-07 | Disposition: A | Discharge status: Home / Self Care | Payer: Self-pay | Source: Ambulatory Visit | Attending: Internal Medicine | Admitting: Internal Medicine

## 2021-01-07 ENCOUNTER — Encounter (HOSPITAL_COMMUNITY): Payer: Self-pay | Admitting: Internal Medicine

## 2021-01-07 ENCOUNTER — Other Ambulatory Visit (HOSPITAL_COMMUNITY): Payer: Self-pay

## 2021-01-07 VITALS — BP 98/68 | HR 79 | Wt 149.6 lb

## 2021-01-07 DIAGNOSIS — I5022 Chronic systolic (congestive) heart failure: Secondary | ICD-10-CM

## 2021-01-07 DIAGNOSIS — R002 Palpitations: Secondary | ICD-10-CM

## 2021-01-07 DIAGNOSIS — Z853 Personal history of malignant neoplasm of breast: Secondary | ICD-10-CM | POA: Insufficient documentation

## 2021-01-07 DIAGNOSIS — Z9114 Patient's other noncompliance with medication regimen: Secondary | ICD-10-CM | POA: Insufficient documentation

## 2021-01-07 DIAGNOSIS — Z8616 Personal history of COVID-19: Secondary | ICD-10-CM | POA: Insufficient documentation

## 2021-01-07 DIAGNOSIS — E0590 Thyrotoxicosis, unspecified without thyrotoxic crisis or storm: Secondary | ICD-10-CM | POA: Insufficient documentation

## 2021-01-07 DIAGNOSIS — I4819 Other persistent atrial fibrillation: Secondary | ICD-10-CM | POA: Insufficient documentation

## 2021-01-07 DIAGNOSIS — Z923 Personal history of irradiation: Secondary | ICD-10-CM | POA: Insufficient documentation

## 2021-01-07 DIAGNOSIS — Z8249 Family history of ischemic heart disease and other diseases of the circulatory system: Secondary | ICD-10-CM | POA: Insufficient documentation

## 2021-01-07 DIAGNOSIS — I493 Ventricular premature depolarization: Secondary | ICD-10-CM

## 2021-01-07 DIAGNOSIS — Z79899 Other long term (current) drug therapy: Secondary | ICD-10-CM | POA: Insufficient documentation

## 2021-01-07 DIAGNOSIS — Z7901 Long term (current) use of anticoagulants: Secondary | ICD-10-CM | POA: Insufficient documentation

## 2021-01-07 DIAGNOSIS — I5023 Acute on chronic systolic (congestive) heart failure: Secondary | ICD-10-CM

## 2021-01-07 DIAGNOSIS — I480 Paroxysmal atrial fibrillation: Secondary | ICD-10-CM

## 2021-01-07 DIAGNOSIS — I428 Other cardiomyopathies: Secondary | ICD-10-CM | POA: Insufficient documentation

## 2021-01-07 LAB — BRAIN NATRIURETIC PEPTIDE: B Natriuretic Peptide: 1638 pg/mL — ABNORMAL HIGH (ref 0.0–100.0)

## 2021-01-07 LAB — CBC
HCT: 38.4 % (ref 36.0–46.0)
Hemoglobin: 12 g/dL (ref 12.0–15.0)
MCH: 26.7 pg (ref 26.0–34.0)
MCHC: 31.3 g/dL (ref 30.0–36.0)
MCV: 85.5 fL (ref 80.0–100.0)
Platelets: 426 10*3/uL — ABNORMAL HIGH (ref 150–400)
RBC: 4.49 MIL/uL (ref 3.87–5.11)
RDW: 19.8 % — ABNORMAL HIGH (ref 11.5–15.5)
WBC: 10.3 10*3/uL (ref 4.0–10.5)
nRBC: 0 % (ref 0.0–0.2)

## 2021-01-07 MED ORDER — DAPAGLIFLOZIN PROPANEDIOL 10 MG PO TABS: 10.0000 mg | ORAL_TABLET | Freq: Every day | ORAL | 3 refills | Status: DC

## 2021-01-07 NOTE — Progress Notes (Signed)
ReDS Vest / Clip - 01/07/21 1300      ReDS Vest / Clip   Station Marker A    Ruler Value 30    ReDS Value Range Moderate volume overload    ReDS Actual Value 38

## 2021-01-07 NOTE — Patient Instructions (Addendum)
Comience Farxiga 10 mg al da. La farmacia ayudar con el acceso a este medicamento.  Trabajo de laboratorio hoy: lo llamaremos con Optician, dispensing.  Usted ha sido referido a TEFL teacher [employer]. Se comunicarn para programar una cita.  Tienes un monitor cardaco ZIO. Devulvalo por correo con el paquete provisto. Su proveedor le ha recomendado que use un parche Zio durante 14 das. Este monitor registrar su ritmo cardaco para nuestra revisin. SI tiene algn sntoma mientras [place] el monitor, presione el botn. Si tiene algn problema con el parche o nota una luz roja o naranja, llame a la compaa al [phone]. Una vez que elimine el parche, envelo por correo a la empresa lo antes posible para que podamos [employer].

## 2021-01-07 NOTE — Progress Notes (Addendum)
Medication Samples have been provided to the patient.  Drug name:Farxiga       Strength: 10 mg        Qty: 4  LOT: [id number]  Exp.Date: 07/24  Dosing instructions: Take 1 tablet  daily  The patient has been instructed regarding the correct time, dose, and frequency of taking this medication, including desired effects and most common side effects.   Megan Keith [name] 1:06 PM 01/07/2021

## 2021-01-07 NOTE — Progress Notes (Signed)
Advanced Heart Failure Clinic Note   [facility] Cardiologist: Glori Bickers, MD  EP: Dr. 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    She presented to [facility] on 04/13[facility]17 with neck pain, SOB and orthopnea. Last EF prior to that reported by Dr. [name] was 35%. Repeat echo showed reduced EF of 15%, she was transferred to [facility] for further evaluation. Underwent right and left heart catheterization on 04/15/17, no CAD. Fick output/index 5.28/3.34.Noted to have >20% PVC on telemetry. She was started on IV Amio and transitioned to po Amio. Discharge weight was 143 pounds.   Admitted 7/13-18/2020 with ADHF in setting of new-onset AF in the setting of hyperthyroidism. TEE with EF 10% RV ok. Started on amiodarone and diuresed. Underwent TEE/DC-CV and felt much better. Also treated with methimazole and prednisone.   Seen again 04/2019.Weightwasup a few pounds. Zio patch placed to monitor PVC burden.   Zio patch 8/20 - NSR avg HR 75 - 13 runs NSVT longest 14 beats - 59 runs SVT longest 9.4 seconds - PVC burden 4.8%  Admitted10/20 for COVID 19 infection c/b recurrent afib w/ RVR. Was loaded on IV amiodaroneand rate improved but did not convert back to NSR. Was placed on Eliquis and discharged home on PO digoxin and metoprolol. Was advised to f/u in [facility] for further management.   Admitted from  AHF clinic 07/03/19 with A Fib RVR + A/C systolic heart failure.   Methimazole and prednisone continued for hyperthyroidism.  EP was consulted. Initial plans were for PVI ablation but procedure canceled after TEE showed severely reduced EF at 5% and severe LAE. Had DC-CV with conversion to NSR.  She was also treated w/ Losartan (BP too soft for Entesto), spironolactone and  digoxin. Overall not felt to be a great candidate for advanced therapies, pharmacist did thorough home med review and she appears to be quite noncompliant. Discharged 07/10/19.  Saw Dr. Rayann Heman on 10/22/19 and was still in NSR. Discussed possible AF ablation. There was concern over degree of MR and LAE.   She finished treatment for hyperthyroidism with Dr. [name] 5/21.    Admitted 04/2020 to [facility] for dyspnea. She was reportedly treated for mild hemoptysis in setting of PNA and CHF. Treated a/ abx and lasix. Appears to have had a chest CT Had low BP and was taken off losartan and Coreg. She was discharged home on PO diuretics but only taking lasix 2 days a week, MWF.   She returned 06/2020 for HF follow up. High PVC burden so Mexitil 200 mg bid started. Megan Keith was also started.   Echo 4/22 EF 20-25%   Today she returns for HF follow up with granddaughter. Feels pretty good. Does have some mild LE edema worse later in the day. No orthopnea or PND. Occasional palpitations. Able to do ADLs without problem.    Cardiac Studies  Echo 03/2017 EF 10-15% Echo 07/2017 EF 25-30%  Echo 11/2017 EF 25-30% Echo 06/2018: EF ~30% (per Dr Haroldine Laws) Echo 8/20 EF 25-30% TEE 11/20 EF 5-10% Echo 2/1 EF 30-35% moderate MR Echo 5/21 EF 25-30%. RV normal.  Echo 11/21 EF 20-25% RV normal.  R/LHC 8/ 018 LHC - normal cors  Right Heart RA (mean): 5 mmHg RV (S/EDP): 52/4 mmHg PA (S/D, mean): 52/20 (31) mmHg  PCWP (mean): 30 mmHg  Ao sat: 96% PA sat: 71% RA sat: 74%  Fick CO: 5.3 L/min Fick CI: 3.3 L/min/m^2  SVR: 1,101 dynscm-5  Zio patch 8/20 - NSR avg HR 75 - 13 runs NSVT longest 14 beats - 59 runs SVT longest 9.4 seconds - PVC burden 4.8%  Past Medical History:  Diagnosis Date  . A-fib ([place])   . Abnormal TSH   . Anemia   . Arthritis    "knees, hands" (04/15/2017)  . Breast cancer ([place])   . CHF (congestive heart failure) ([place])   . Chronic knee pain   . Chronic systolic CHF  (congestive heart failure) ([place])   . COVID-19   . Does not have health insurance   . Frequent PVCs   . Hyperthyroidism 03/05/2019  . Hyperthyroidism 03/05/2019  . 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 NICM (nonischemic cardiomyopathy) ([place])    a. ? PVC cardiomyopathy. right and left heart catheterization on 04/15/17, no CAD. Fick output/index 5.28/3.34.  . Noncompliance    a. episodic noncompliance.  . Pneumonia 06/2016    Current Outpatient Medications  Medication Sig Dispense Refill  . amiodarone (PACERONE) 100 MG tablet Take 1 tablet (100 mg total) by mouth daily. 30 tablet 3  . anastrozole (ARIMIDEX) 1 MG tablet Take 1 mg by mouth daily.    Marland Kitchen apixaban (ELIQUIS) 5 MG TABS tablet Take 1 tablet (5 mg total) by mouth 2 (two) times daily. 60 tablet 6  . carvedilol (COREG) 3.125 MG tablet Take 1 tablet (3.125 mg total) by mouth 2 (two) times daily. 60 tablet 6  . cefdinir (OMNICEF) 300 MG capsule Take 300 mg by mouth 2 (two) times daily.    . furosemide (LASIX) 40 MG tablet Take 1 tablet (40 mg total) by mouth daily. 30 tablet 6  . mexiletine (MEXITIL) 200 MG capsule Take 1 capsule (200 mg total) by mouth 2 (two) times daily. 60 capsule 6  . Specialty Vitamins Products (ONE-A-DAY BONE STRENGTH PO) Take 1 tablet by mouth daily.    Marland Kitchen spironolactone (ALDACTONE) 25 MG tablet Take 1 tablet (25 mg total) by mouth daily. 30 tablet 6   No current facility-administered medications for this encounter.    No Known Allergies    Social History   Socioeconomic History  . Marital status: Single    Spouse name: Not on file  . Number of children: Not on file  . Years of education: Not on file  . Highest education level: Not on file  Occupational History  . Not on file  Tobacco Use  . Smoking status: Never Smoker  . Smokeless tobacco: Never Used  Vaping Use  . Vaping Use: Never used  Substance and Sexual Activity  . Alcohol use: Not Currently    Comment: 04/15/2017 "might  have a couple beers q couple months"  . Drug use: Not Currently  . Sexual activity: Not Currently  Other Topics Concern  . Not on file  Social History Narrative   ** Merged History Encounter **       N/A   Social Determinants of Health   Financial Resource Strain: Not on file  Food Insecurity: Not on file  Transportation Needs: Not on file  Physical Activity: Not on file  Stress: Not on file  Social Connections: Not on file  Intimate Partner Violence: Not on file     Family History  Problem Relation Age of Onset  . Hypertension Mother     Vitals:  01/07/21 1206  BP: 98/68  Pulse: 79  SpO2: 99%  Weight: 67.9 kg (149 lb 9.6 oz)   Wt Readings from Last 3 Encounters:  01/07/21 67.9 kg (149 lb 9.6 oz)  12/31/20 67.8 kg (149 lb 6.4 oz)  10/30/20 66.6 kg (146 lb 14.4 oz)    PHYSICAL EXAM: General:  Well appearing. No resp difficulty HEENT: normal Neck: supple. JVP 8 Carotids 2+ bilat; no bruits. No lymphadenopathy or thryomegaly appreciated. Cor: PMI nondisplaced. Regular rate & rhythm. No rubs, gallops or murmurs. Lungs: clear Abdomen: soft, nontender, nondistended. No hepatosplenomegaly. No bruits or masses. Good bowel sounds. Extremities: no cyanosis, clubbing, rash, 1+ edema Neuro: alert & orientedx3, cranial nerves grossly intact. moves all 4 extremities w/o difficulty. Affect pleasant   EKG: NSR 82 bpm QRS 90 ms  Personally reviewed  REDS 38%  ASSESSMENT & PLAN:  1.Chronic systolic SAY:TKZS, normal cors in 03/2017. 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  - 03/2017 EF 15-20% Echo 07/28/17 EF 25-30%  - Echo 06/2018 EF ~30% - TEE 11/20 EF 5% in setting of AF - Echo 2/21 EF 30-35% - Echo 5/21 EF 25-30%. RV ok  - Echo 07/08/20 EF 20-25% RV ok - Echo 4/22 20-25%  - Stable NYHA II. Volume status mildly elevated REDS 38% - Continue Coreg 3.125 mg bid.   - Continue spiro 25 mg daily.  - Continue lasix 40 daily - Was supposed to be taking [place] 10 but not on today  (we reviewed meds in detail). Will restart - BP has been too low for ARB/ARNI - Have not proceeded with ICD given non-compliance and hope that EF would improve with rhythm management. Will repeat ZIO to ensure that PVCs suppressed Refer to Dr. Rayann Heman for possible ICD  2. PAF - s/p previous DC-CV in 7/20. 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  Previously was on amio for PVCs but developed hyperthyroidism, now being treated w/ methimazole. Admitted 06/2019 for persistent afib w/ RVR + a/c systolic CHF. Loaded w/ IV amiodarone and evaluated by EP. Initial plans were for PVI ablation but procedure canceled after TEE showed severely reduced EF at 5% and severe LAE. Pt underwent DCCV instead. Saw Dr. Rayann Heman again in 2/21. Planned for AF ablation but does not have insurance coverage for AF ablation. Continue medical treatment. - Remains in NSR today on amio 100 daily.  - Continue Eliquis 5 mg bid. No bleeding issues. t.   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   Free T3 T4 ok  - Completed treatment. . - Check labs today  4.H/oFrequent PVCs - Zio patch8/20with 5% PVC burden; Zio patch (3 days)  -11/21 with 16% PVC burden. - Amio + Mexiletine as above. - No PVCs on EKG today.  - repeat zio as above  5. Left Breast Cancer. -Treated w/XRT.   6. Medication noncompliance - Seems to be taking all meds.     Glori Bickers, MD 01/07/21

## 2021-01-07 NOTE — Telephone Encounter (Signed)
Advanced Heart Failure Patient Advocate Encounter  Patient was seen in clinic today and expressed that she does not have [place]. Started an application for [place] assistance.   Will submit after I get the income information from the patient's grand daughter.

## 2021-01-08 NOTE — Addendum Note (Signed)
Encounter addended by: Stanford Scotland, RN on: 01/08/2021 8:00 AM  Actions taken: Clinical Note Signed, Order Reconciliation Section accessed

## 2021-02-11 ENCOUNTER — Other Ambulatory Visit (INDEPENDENT_AMBULATORY_CARE_PROVIDER_SITE_OTHER): Payer: Self-pay

## 2021-02-11 ENCOUNTER — Ambulatory Visit: Payer: Self-pay | Admitting: Internal Medicine

## 2021-02-11 ENCOUNTER — Other Ambulatory Visit: Payer: Self-pay

## 2021-02-11 DIAGNOSIS — E064 Drug-induced thyroiditis: Secondary | ICD-10-CM

## 2021-02-11 DIAGNOSIS — T462X5A Adverse effect of other antidysrhythmic drugs, initial encounter: Secondary | ICD-10-CM

## 2021-02-11 LAB — TSH: TSH: 0.53 u[IU]/mL (ref 0.35–4.50)

## 2021-02-11 LAB — T4, FREE: Free T4: 1.16 ng/dL (ref 0.60–1.60)

## 2021-02-11 LAB — T3, FREE: T3, Free: 4.6 pg/mL — ABNORMAL HIGH (ref 2.3–4.2)

## 2021-02-18 NOTE — Telephone Encounter (Signed)
Advanced Heart Failure Patient Advocate Encounter  I have reached out to the patient's grand daughter [name] several times as requested for financial information, her vm is full and no way to leave a message.  Will have the application waiting until that is complete.

## 2021-03-10 ENCOUNTER — Other Ambulatory Visit: Payer: Self-pay

## 2021-03-10 ENCOUNTER — Other Ambulatory Visit (HOSPITAL_COMMUNITY): Payer: Self-pay | Admitting: *Deleted

## 2021-03-10 ENCOUNTER — Encounter (HOSPITAL_COMMUNITY): Payer: Self-pay

## 2021-03-10 ENCOUNTER — Ambulatory Visit (HOSPITAL_COMMUNITY)
Admission: RE | Admit: 2021-03-10 | Discharge: 2021-03-10 | Disposition: A | Discharge status: Home / Self Care | Payer: Self-pay | Source: Ambulatory Visit | Attending: Cardiology | Admitting: Cardiology

## 2021-03-10 VITALS — BP 102/70 | HR 86 | Wt 152.2 lb

## 2021-03-10 DIAGNOSIS — C50912 Malignant neoplasm of unspecified site of left female breast: Secondary | ICD-10-CM | POA: Insufficient documentation

## 2021-03-10 DIAGNOSIS — Z9114 Patient's other noncompliance with medication regimen: Secondary | ICD-10-CM | POA: Insufficient documentation

## 2021-03-10 DIAGNOSIS — Z79899 Other long term (current) drug therapy: Secondary | ICD-10-CM | POA: Insufficient documentation

## 2021-03-10 DIAGNOSIS — C539 Malignant neoplasm of cervix uteri, unspecified: Secondary | ICD-10-CM | POA: Insufficient documentation

## 2021-03-10 DIAGNOSIS — I480 Paroxysmal atrial fibrillation: Secondary | ICD-10-CM

## 2021-03-10 DIAGNOSIS — Z7901 Long term (current) use of anticoagulants: Secondary | ICD-10-CM | POA: Insufficient documentation

## 2021-03-10 DIAGNOSIS — I4819 Other persistent atrial fibrillation: Secondary | ICD-10-CM | POA: Insufficient documentation

## 2021-03-10 DIAGNOSIS — Z8616 Personal history of COVID-19: Secondary | ICD-10-CM | POA: Insufficient documentation

## 2021-03-10 DIAGNOSIS — T462X5A Adverse effect of other antidysrhythmic drugs, initial encounter: Secondary | ICD-10-CM | POA: Insufficient documentation

## 2021-03-10 DIAGNOSIS — I5022 Chronic systolic (congestive) heart failure: Secondary | ICD-10-CM

## 2021-03-10 DIAGNOSIS — I493 Ventricular premature depolarization: Secondary | ICD-10-CM

## 2021-03-10 DIAGNOSIS — Z8249 Family history of ischemic heart disease and other diseases of the circulatory system: Secondary | ICD-10-CM | POA: Insufficient documentation

## 2021-03-10 DIAGNOSIS — E0580 Other thyrotoxicosis without thyrotoxic crisis or storm: Secondary | ICD-10-CM | POA: Insufficient documentation

## 2021-03-10 LAB — COMPREHENSIVE METABOLIC PANEL
ALT: 18 U/L (ref 0–44)
AST: 25 U/L (ref 15–41)
Albumin: 3.8 g/dL (ref 3.5–5.0)
Alkaline Phosphatase: 104 U/L (ref 38–126)
Anion gap: 5 (ref 5–15)
BUN: 8 mg/dL (ref 6–20)
CO2: 25 mmol/L (ref 22–32)
Calcium: 8.9 mg/dL (ref 8.9–10.3)
Chloride: 109 mmol/L (ref 98–111)
Creatinine, Ser: 0.65 mg/dL (ref 0.44–1.00)
GFR, Estimated: >60 mL/min (ref >60)
Glucose, Bld: 104 mg/dL — ABNORMAL HIGH (ref 70–99)
Potassium: 4.8 mmol/L (ref 3.5–5.1)
Sodium: 139 mmol/L (ref 135–145)
Total Bilirubin: 0.5 mg/dL (ref 0.3–1.2)
Total Protein: 6.6 g/dL (ref 6.5–8.1)

## 2021-03-10 LAB — CBC
HCT: 40.1 % (ref 36.0–46.0)
Hemoglobin: 12.4 g/dL (ref 12.0–15.0)
MCH: 28.5 pg (ref 26.0–34.0)
MCHC: 30.9 g/dL (ref 30.0–36.0)
MCV: 92.2 fL (ref 80.0–100.0)
Platelets: 349 10*3/uL (ref 150–400)
RBC: 4.35 MIL/uL (ref 3.87–5.11)
RDW: 20.3 % — ABNORMAL HIGH (ref 11.5–15.5)
WBC: 7.1 10*3/uL (ref 4.0–10.5)
nRBC: 0 % (ref 0.0–0.2)

## 2021-03-10 MED ORDER — SODIUM CHLORIDE 0.9% FLUSH
3.0000 mL | Freq: Two times a day (BID) | INTRAVENOUS | Status: DC
Start: 2021-03-10 — End: 2021-03-10

## 2021-03-10 NOTE — Progress Notes (Signed)
Station-A Value Range-34 Actual value-34%

## 2021-03-10 NOTE — Patient Instructions (Addendum)
Laboratorios realizados [employer], lo llamaremos si hay resultados anormales  Cateterismo cardaco, consulte las instrucciones a continuacin  Si tiene alguna pregunta o inquietud antes de su prxima cita, envenos un mensaje a travs de mychart o llame a nuestra oficina al [phone].  PARA DEJAR UN MENSAJE PARA LA ENFERMERA SELECCIONE LA OPCIN 2, POR FAVOR DEJE UN MENSAJE QUE INCLUYA:  SU NOMBRE  FECHA DE NACIMIENTO  NMERO DE DEVOLUCIN DE LLAMADA  MOTIVO DE LA LLAMADA**esto es importante ya que damos prioridad a las devoluciones de llamadas  RECIBIR UNA LLAMADA EL MISMO DA SIEMPRE QUE LLAME ANTES DE LAS 4:00 PM  milEn la [employer], usted y sus necesidades de salud son [place] prioridad. Como parte de nuestra misin continua de brindarle una atencin cardaca excepcional, hemos creado equipos de atencin de proveedores designados. Estos equipos de atencin incluyen a su cardilogo primario (mdico) y proveedores de Financial planner (APP, asistentes mdicos y enfermeras practicantes) que trabajan juntos para brindarle la atencin que necesita, cuando la necesita.  Puede ver a cualquiera de los siguientes proveedores en su equipo de atencin designado en su prximo seguimiento:  Dr. Quillian Quince Bensimhon  Dr. Loralie Champagne  Dr. Vickki Muff  Amy Ninfa Meeker, NP  Imagene Gurney, [place]  Allena Katz, NP  Audry Riles, Doctora en [place] de traer todas las botellas de sus medicamentos a cada cita.   INSTRUCCIONES DE CATETERIZACIN  Tiene programado un cateterismo cardaco el jueves 21 de julio con el Dr. Glori Bickers.  1. Llegue a [place] (Entrada principal A) en [facility]: [address] [place], [place] [address] a las 10:00 a. m. (Esta hora es dos horas antes de su procedimiento para garantizar su preparacin). El servicio de aparcacoches gratuito est disponible.  Nota especial: Se hace todo lo posible para que  su procedimiento se realice a tiempo. Por favor comprenda que las emergencias a veces retrasan los procedimientos programados.  2. Dieta: No coma alimentos slidos despus de la medianoche. El paciente puede tomar lquidos claros [employer] 5 am del da del procedimiento.  3. Laboratorios: [place]  4. Instrucciones de medicacin en preparacin para su procedimiento:   Alergia al contraste: No  Jueves 7/21 AM NO TOME FUROSEMIDA NI ESPIRONOLACTONA  En la maana de su procedimiento, tome cualquier medicamento matutino que NO se encuentre en la lista anterior. Puede usar sorbos de Chartered loss adjuster.  5. Planee una estada de una noche: traiga sus pertenencias personales. 6. Ane Payment lista actualizada de sus medicamentos y tarjetas de seguro vigentes. 7. DEBE tener una persona responsable que lo lleve a casa. 8. Alguien DEBE estar con usted las primeras 24 horas despus de llegar a casa o su alta se retrasar. 9. Use ropa que sea fcil de poner y [place] y use zapatos sin cordones.  Gracias por permitirnos cuidar de usted!   -- Servicios cardiovasculares invasivos [place]

## 2021-03-10 NOTE — Progress Notes (Addendum)
error 

## 2021-03-10 NOTE — H&P (View-Only) (Signed)
Advanced Heart Failure Clinic Note   [facility] Cardiologist: Megan Bickers, MD  EP: Dr. Rayann Keith   Reason for Visit:f/u for Chronic Systolic Heart Failure   HPI: Megan Keith is a 60 y.o. 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      She presented to [facility] on 04/13/17 with neck pain, SOB and orthopnea. Last EF prior to that reported by Dr. Bettina Keith was 35%. Repeat echo showed reduced EF of 15%, she was transferred to [facility] for further evaluation. Underwent right and left heart catheterization on 04/15/17, no CAD. Fick output/index 5.28/3.34.Noted to have >20% PVC on telemetry. She was started on IV Amio and transitioned to po Amio. Discharge weight was 143 pounds.    Admitted 7/13-18/2020 with ADHF in setting of new-onset AF in the setting of hyperthyroidism. TEE with EF 10% RV ok. Started on amiodarone and diuresed. Underwent TEE/DC-CV and felt much better. Also treated with methimazole and prednisone.   Seen again 04/2019. Weight was up a few pounds. Zio patch placed to monitor PVC burden.    Zio patch 8/20 - NSR avg HR 75 - 13 runs NSVT longest 14 beats - 59 runs SVT longest 9.4 seconds - PVC burden 4.8%   Admitted 10/20 for COVID 19 infection c/b recurrent afib w/ RVR. Was loaded on IV amiodarone and rate improved but did not convert back to NSR. Was placed on Eliquis and discharged home on PO digoxin and metoprolol. Was advised to f/u in [facility] for further management.    Admitted from  AHF clinic 07/03/19 with A Fib RVR + A/C systolic heart failure.   Methimazole and prednisone continued for hyperthyroidism.  EP was consulted. Initial plans were for PVI ablation but procedure canceled after TEE showed severely reduced EF at 5% and severe LAE. Had DC-CV with conversion to NSR.  She was also treated w/ Losartan (BP too soft for  Entesto), spironolactone and digoxin. Overall not felt to be a great candidate for advanced therapies, pharmacist did thorough home med review and she appears to be quite noncompliant. Discharged 07/10/19.  Saw Dr. Rayann Keith on 10/22/19 and was still in NSR. Discussed possible AF ablation. There was concern over degree of MR and LAE.   She finished treatment for hyperthyroidism with Dr. Renne Keith 5/21.    Admitted 04/2020 to [facility] for dyspnea. She was reportedly treated for mild hemoptysis in setting of PNA and CHF. Treated w/ abx and lasix. Appears to have had a chest CT. Had low BP and was taken off losartan and Coreg. She was discharged home on PO diuretics but only taking lasix 2 days a week, MWF.   She returned 06/2020 for HF follow up. High PVC burden so Mexitil 200 mg bid started. Megan Keith was also started.   Echo 4/22 EF 20-25%   Had return f/u 5/22 and was mildly fluid overloaded, ReDs clip 38%. Was started on Farxiga 10 mg daily. Zio monitor was also ordered to reassess PVC burden on Mexilitine. This showed occasional PVCs, burden low at 3.3%. Also had 32 runs of SVT, the longest lasting 10 beats w/ average HR 104 bpm.   She returns to clinic today for f/u. Here w/ her granddaughter. Feels tired. Notes increased fatigue over the last 2 weeks. Little energy. Sleeping more. NYHA Class III. Stopped Farxiga after ~2 weeks b/c she felt poorly after starting  it, but symptoms have persists. Compliant w/ all other meds. She had recent f/u w/ endocrinology and TFTs almost all at goal, with only a slightly high free T3 (4.6) Dr. Renne Keith recommended no further interventions.   She also reports recent diagnosis of Cervical cancer and is scheduled to undergo conization of cervix/ D&C on 7/22 at [facility].   Volume status is stable, ReDs Clip 34%. EKG shows NSR. BP soft 102/70. No ischemic CP.    Cardiac Studies   Echo 03/2017 EF 10-15% Echo 07/2017 EF 25-30%  Echo 11/2017 EF 25-30% Echo 06/2018: EF ~30%  (per Dr Megan Keith) Echo 8/20 EF 25-30%  TEE 11/20 EF 5-10% Echo 2/1 EF 30-35% moderate MR Echo 5/21 EF 25-30%. RV normal.  Echo 11/21 EF 20-25% RV normal.  R/LHC 8/ 018 LHC - normal cors  Right Heart RA (mean): 5 mmHg RV (S/EDP): 52/4 mmHg PA (S/D, mean): 52/20 (31) mmHg PCWP (mean): 30 mmHg  Ao sat: 96% PA sat: 71% RA sat: 74%  Fick CO: 5.3 L/min Fick CI: 3.3 L/min/m^2  SVR: 1,101 dynscm-5  Zio patch 8/20 - NSR avg HR 75 - 13 runs NSVT longest 14 beats - 59 runs SVT longest 9.4 seconds - PVC burden 4.8%  Zio Patch 5/22 1. Sinus rhythm - avg HR of 79 bpm. 2. 32 runs of SVT - the longest lasting 10 beats with an avg rate of 104 bpm 3. 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 ? PVC cardiomyopathy. right and left heart catheterization on 04/15/17, no CAD. Fick output/index 5.28/3.34.   Noncompliance    a. episodic noncompliance.   Pneumonia 06/2016    Current Outpatient Medications  Medication Sig Dispense Refill   acetaminophen (TYLENOL) 650 MG CR tablet Take by mouth every 8 (eight) hours as needed.     amiodarone (PACERONE) 100 MG tablet Take 1 tablet (100 mg total) by mouth daily. 30 tablet 3   anastrozole (ARIMIDEX) 1 MG tablet Take 1 mg by mouth daily.     apixaban (ELIQUIS) 5 MG TABS tablet Take 1 tablet (5 mg total) by mouth 2 (two) times daily. 60 tablet 6   carvedilol (COREG) 3.125 MG tablet Take 1 tablet (3.125 mg total) by mouth 2 (two) times daily. 60 tablet 6    furosemide (LASIX) 40 MG tablet Take 1 tablet (40 mg total) by mouth daily. 30 tablet 6   omeprazole (PRILOSEC) 20 MG capsule Take by mouth.     spironolactone (ALDACTONE) 25 MG tablet Take 1 tablet (25 mg total) by mouth daily. 30 tablet 6   No current facility-administered medications for this encounter.    No Known Allergies    Social History   Socioeconomic History   Marital status: Single    Spouse name: Not on file   Number of children: Not on file   Years of education: Not on file   Highest education level:  Not on file  Occupational History   Not on file  Tobacco Use   Smoking status: Never   Smokeless tobacco: Never  Vaping Use   Vaping Use: Never used  Substance and Sexual Activity   Alcohol use: Not Currently    Comment: 04/15/2017 "might have a couple beers q couple months"   Drug use: Not Currently   Sexual activity: Not Currently  Other Topics Concern   Not on file  Social History Narrative   ** Merged History Encounter **       N/A   Social Determinants of Health   Financial Resource Strain: Not on file  Food Insecurity: Not on file  Transportation Needs: Not on file  Physical Activity: Not on file  Stress: Not on file  Social Connections: Not on file  Intimate Partner Violence: Not on file     Family History  Problem Relation Age of Onset   Hypertension Mother     Vitals:   03/10/21 1520  BP: 102/70  Pulse: 86  SpO2: 98%  Weight: 69 kg (152 lb 3.2 oz)    Wt Readings from Last 3 Encounters:  03/10/21 69 kg (152 lb 3.2 oz)  01/07/21 67.9 kg (149 lb 9.6 oz)  12/31/20 67.8 kg (149 lb 6.4 oz)    PHYSICAL EXAM: ReDS Clip 34% General:  mildly fatigued appearing. No respiratory difficulty HEENT: normal Neck: supple. no JVD. Carotids 2+ bilat; no bruits. No lymphadenopathy or thyromegaly appreciated. Cor: PMI nondisplaced. Regular rate & rhythm. No rubs, gallops or murmurs. Lungs: clear Abdomen: soft, nontender, nondistended. No  hepatosplenomegaly. No bruits or masses. Good bowel sounds. Extremities: no cyanosis, clubbing, rash, edema, LEs cool to touch  Neuro: alert & oriented x 3, cranial nerves grossly intact. moves all 4 extremities w/o difficulty. Affect pleasant.    EKG: NSR 88 bpm, nonspecific TW abnormalities Personally reviewed  REDS 34%  ASSESSMENT & PLAN:  1. Chronic systolic CHF: NICM, normal cors in 03/2017. 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  - 03/2017  EF 15-20% Echo 07/28/17 EF 25-30%  - Echo 06/2018 EF ~30% - TEE 11/20 EF 5% in setting of AF - Echo 2/21 EF 30-35% - Echo 5/21 EF 25-30%. RV ok  - Echo 07/08/20 EF 20-25% RV ok - Echo 4/22 20-25%  - Volume status stable, ReDs Clip 34%  - Notes increased fatigued/ decreased exercise tolerance. NYHA Class III. Exam concerning for potential low output. BP soft, LEs cool to touch. D/w Dr. Haroldine Keith, will arrange for RHC to assess hemodynamics on 7/21 prior to undergoing surgery for tx for cervical cancer. Discussed risk vs benefit and she agrees to procedure  - Continue Coreg 3.125 mg bid for now   - Continue spiro 25 mg daily.  - Continue lasix 40 daily - Intolerant to [place]  - BP has been too low for ARB/ARNI - Have not proceeded with ICD given non-compliance and hope that EF would improve with rhythm management.   2. PAF - s/p previous DC-CV in 7/20. 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  Previously was on amio for PVCs but developed hyperthyroidism, now being treated w/ methimazole. Admitted 06/2019 for persistent afib w/ RVR + a/c systolic CHF. Loaded w/ IV amiodarone and evaluated by EP. Initial plans were for PVI ablation but procedure canceled after TEE showed severely reduced EF at 5% and severe LAE. Pt underwent DCCV instead.  Saw Dr. Rayann Keith again in 2/21. Planned for AF ablation but does not have insurance coverage for AF ablation. Continue  medical treatment. - Remains in NSR today on EKG on amio 100 daily.  - Recent TFTs ok. Check CMP today  to assess hepatic function  - Continue Eliquis 5 mg bid (currently on hold perioperatively)   3. 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 Per Dr. Jasmine December, no further intervention needed   4. H/o Frequent PVCs - Zio patch 8/20 with 5% PVC burden; Zio patch (3 days)  -11/21 with 16% PVC burden. - Amio + Mexiletine as above. - No PVCs on EKG today.  - F/u Zio 5/22 w/ only occasional PVCs, burden 3.3%    5. Left Breast Cancer. - Treated w/ XRT.   6. Medication noncompliance - Seems to be taking all meds.   7. Cervical Cancer - followed by [facility] - scheduled to undergo  - conization of cervix/ D&C on 7/22  F/u post RHC, in 2 weeks w/ APP    Lyda Jester, PA-C 03/10/21

## 2021-03-10 NOTE — Progress Notes (Signed)
Advanced Heart Failure Clinic Note   PCP: [facility] Cardiologist: Glori Bickers, MD  EP: Dr. Rayann Heman   Reason for Visit:f/u for Chronic Systolic Heart Failure   HPI: Megan Keith is a 60 y.o. 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      She presented to [facility] on 04/13/17 with neck pain, SOB and orthopnea. Last EF prior to that reported by Dr. [name] was 35%. Repeat echo showed reduced EF of 15%, she was transferred to [facility] for further evaluation. Underwent right and left heart catheterization on 04/15/17, no CAD. Fick output/index 5.28/3.34.Noted to have >20% PVC on telemetry. She was started on IV Amio and transitioned to po Amio. Discharge weight was 143 pounds.    Admitted 7/13-18/2020 with ADHF in setting of new-onset AF in the setting of hyperthyroidism. TEE with EF 10% RV ok. Started on amiodarone and diuresed. Underwent TEE/DC-CV and felt much better. Also treated with methimazole and prednisone.   Seen again 04/2019. Weight was up a few pounds. Zio patch placed to monitor PVC burden.    Zio patch 8/20 - NSR avg HR 75 - 13 runs NSVT longest 14 beats - 59 runs SVT longest 9.4 seconds - PVC burden 4.8%   Admitted 10/20 for COVID 19 infection c/b recurrent afib w/ RVR. Was loaded on IV amiodarone and rate improved but did not convert back to NSR. Was placed on Eliquis and discharged home on PO digoxin and metoprolol. Was advised to f/u in [facility] for further management.    Admitted from  AHF clinic 07/03/19 with A Fib RVR + A/C systolic heart failure.   Methimazole and prednisone continued for hyperthyroidism.  EP was consulted. Initial plans were for PVI ablation but procedure canceled after TEE showed severely reduced EF at 5% and severe LAE. Had DC-CV with conversion to NSR.  She was also treated w/ Losartan (BP too soft for  Entesto), spironolactone and digoxin. Overall not felt to be a great candidate for advanced therapies, pharmacist did thorough home med review and she appears to be quite noncompliant. Discharged 07/10/19.  Saw Dr. Rayann Heman on 10/22/19 and was still in NSR. Discussed possible AF ablation. There was concern over degree of MR and LAE.   She finished treatment for hyperthyroidism with Dr. [name] 5/21.    Admitted 04/2020 to [facility] for dyspnea. She was reportedly treated for mild hemoptysis in setting of PNA and CHF. Treated w/ abx and lasix. Appears to have had a chest CT. Had low BP and was taken off losartan and Coreg. She was discharged home on PO diuretics but only taking lasix 2 days a week, MWF.   She returned 06/2020 for HF follow up. High PVC burden so Mexitil 200 mg bid started. [name] was also started.   Echo 4/22 EF 20-25%   Had return f/u 5/22 and was mildly fluid overloaded, ReDs clip 38%. Was started on Farxiga 10 mg daily. Zio monitor was also ordered to reassess PVC burden on Mexilitine. This showed occasional PVCs, burden low at 3.3%. Also had 32 runs of SVT, the longest lasting 10 beats w/ average HR 104 bpm.   She returns to clinic today for f/u. Here w/ her granddaughter. Feels tired. Notes increased fatigue over the last 2 weeks. Little energy. Sleeping more. NYHA Class III. Stopped Farxiga after ~2 weeks b/c she felt poorly after starting  it, but symptoms have persists. Compliant w/ all other meds. She had recent f/u w/ endocrinology and TFTs almost all at goal, with only a slightly high free T3 (4.6) Dr. [name] recommended no further interventions.   She also reports recent diagnosis of Cervical cancer and is scheduled to undergo conization of cervix/ D&C on 7/22 at [facility].   Volume status is stable, ReDs Clip 34%. EKG shows NSR. BP soft 102/70. No ischemic CP.    Cardiac Studies   Echo 03/2017 EF 10-15% Echo 07/2017 EF 25-30%  Echo 11/2017 EF 25-30% Echo 06/2018: EF ~30%  (per Dr Haroldine Laws) Echo 8/20 EF 25-30%  TEE 11/20 EF 5-10% Echo 2/1 EF 30-35% moderate MR Echo 5/21 EF 25-30%. RV normal.  Echo 11/21 EF 20-25% RV normal.  R/LHC 8/ 018 LHC - normal cors  Right Heart RA (mean): 5 mmHg RV (S/EDP): 52/4 mmHg PA (S/D, mean): 52/20 (31) mmHg PCWP (mean): 30 mmHg  Ao sat: 96% PA sat: 71% RA sat: 74%  Fick CO: 5.3 L/min Fick CI: 3.3 L/min/m^2  SVR: 1,101 dynscm-5  Zio patch 8/20 - NSR avg HR 75 - 13 runs NSVT longest 14 beats - 59 runs SVT longest 9.4 seconds - PVC burden 4.8%  Zio Patch 5/22 1. Sinus rhythm - avg HR of 79 bpm. 2. 32 runs of SVT - the longest lasting 10 beats with an avg rate of 104 bpm 3. 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 ? PVC cardiomyopathy. right and left heart catheterization on 04/15/17, no CAD. Fick output/index 5.28/3.34.   Noncompliance    a. episodic noncompliance.   Pneumonia 06/2016    Current Outpatient Medications  Medication Sig Dispense Refill   acetaminophen (TYLENOL) 650 MG CR tablet Take by mouth every 8 (eight) hours as needed.     amiodarone (PACERONE) 100 MG tablet Take 1 tablet (100 mg total) by mouth daily. 30 tablet 3   anastrozole (ARIMIDEX) 1 MG tablet Take 1 mg by mouth daily.     apixaban (ELIQUIS) 5 MG TABS tablet Take 1 tablet (5 mg total) by mouth 2 (two) times daily. 60 tablet 6   carvedilol (COREG) 3.125 MG tablet Take 1 tablet (3.125 mg total) by mouth 2 (two) times daily. 60 tablet 6    furosemide (LASIX) 40 MG tablet Take 1 tablet (40 mg total) by mouth daily. 30 tablet 6   omeprazole (PRILOSEC) 20 MG capsule Take by mouth.     spironolactone (ALDACTONE) 25 MG tablet Take 1 tablet (25 mg total) by mouth daily. 30 tablet 6   No current facility-administered medications for this encounter.    No Known Allergies    Social History   Socioeconomic History   Marital status: Single    Spouse name: Not on file   Number of children: Not on file   Years of education: Not on file   Highest education level:  Not on file  Occupational History   Not on file  Tobacco Use   Smoking status: Never   Smokeless tobacco: Never  Vaping Use   Vaping Use: Never used  Substance and Sexual Activity   Alcohol use: Not Currently    Comment: 04/15/2017 "might have a couple beers q couple months"   Drug use: Not Currently   Sexual activity: Not Currently  Other Topics Concern   Not on file  Social History Narrative   ** Merged History Encounter **       N/A   Social Determinants of Health   Financial Resource Strain: Not on file  Food Insecurity: Not on file  Transportation Needs: Not on file  Physical Activity: Not on file  Stress: Not on file  Social Connections: Not on file  Intimate Partner Violence: Not on file     Family History  Problem Relation Age of Onset   Hypertension Mother     Vitals:   03/10/21 1520  BP: 102/70  Pulse: 86  SpO2: 98%  Weight: 69 kg (152 lb 3.2 oz)    Wt Readings from Last 3 Encounters:  03/10/21 69 kg (152 lb 3.2 oz)  01/07/21 67.9 kg (149 lb 9.6 oz)  12/31/20 67.8 kg (149 lb 6.4 oz)    PHYSICAL EXAM: ReDS Clip 34% General:  mildly fatigued appearing. No respiratory difficulty HEENT: normal Neck: supple. no JVD. Carotids 2+ bilat; no bruits. No lymphadenopathy or thyromegaly appreciated. Cor: PMI nondisplaced. Regular rate & rhythm. No rubs, gallops or murmurs. Lungs: clear Abdomen: soft, nontender, nondistended. No  hepatosplenomegaly. No bruits or masses. Good bowel sounds. Extremities: no cyanosis, clubbing, rash, edema, LEs cool to touch  Neuro: alert & oriented x 3, cranial nerves grossly intact. moves all 4 extremities w/o difficulty. Affect pleasant.    EKG: NSR 88 bpm, nonspecific TW abnormalities Personally reviewed  REDS 34%  ASSESSMENT & PLAN:  1. Chronic systolic CHF: NICM, normal cors in 03/2017. 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  - 03/2017  EF 15-20% Echo 07/28/17 EF 25-30%  - Echo 06/2018 EF ~30% - TEE 11/20 EF 5% in setting of AF - Echo 2/21 EF 30-35% - Echo 5/21 EF 25-30%. RV ok  - Echo 07/08/20 EF 20-25% RV ok - Echo 4/22 20-25%  - Volume status stable, ReDs Clip 34%  - Notes increased fatigued/ decreased exercise tolerance. NYHA Class III. Exam concerning for potential low output. BP soft, LEs cool to touch. D/w Dr. Haroldine Laws, will arrange for RHC to assess hemodynamics on 7/21 prior to undergoing surgery for tx for cervical cancer. Discussed risk vs benefit and she agrees to procedure  - Continue Coreg 3.125 mg bid for now   - Continue spiro 25 mg daily.  - Continue lasix 40 daily - Intolerant to [place]  - BP has been too low for ARB/ARNI - Have not proceeded with ICD given non-compliance and hope that EF would improve with rhythm management.   2. PAF - s/p previous DC-CV in 7/20. 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  Previously was on amio for PVCs but developed hyperthyroidism, now being treated w/ methimazole. Admitted 06/2019 for persistent afib w/ RVR + a/c systolic CHF. Loaded w/ IV amiodarone and evaluated by EP. Initial plans were for PVI ablation but procedure canceled after TEE showed severely reduced EF at 5% and severe LAE. Pt underwent DCCV instead.  Saw Dr. Rayann Heman again in 2/21. Planned for AF ablation but does not have insurance coverage for AF ablation. Continue  medical treatment. - Remains in NSR today on EKG on amio 100 daily.  - Recent TFTs ok. Check CMP today  to assess hepatic function  - Continue Eliquis 5 mg bid (currently on hold perioperatively)   3. 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 Per Dr. Jasmine December, no further intervention needed   4. H/o Frequent PVCs - Zio patch 8/20 with 5% PVC burden; Zio patch (3 days)  -11/21 with 16% PVC burden. - Amio + Mexiletine as above. - No PVCs on EKG today.  - F/u Zio 5/22 w/ only occasional PVCs, burden 3.3%    5. Left Breast Cancer. - Treated w/ XRT.   6. Medication noncompliance - Seems to be taking all meds.   7. Cervical Cancer - followed by [facility] - scheduled to undergo  - conization of cervix/ D&C on 7/22  F/u post RHC, in 2 weeks w/ APP    Lyda Jester, PA-C 03/10/21

## 2021-03-12 ENCOUNTER — Encounter (HOSPITAL_COMMUNITY): Payer: Self-pay | Admitting: Internal Medicine

## 2021-03-12 ENCOUNTER — Ambulatory Visit (HOSPITAL_COMMUNITY)
Admission: RE | Disposition: A | Discharge status: Home / Self Care | Payer: Self-pay | Source: Home / Self Care | Attending: Internal Medicine

## 2021-03-12 ENCOUNTER — Other Ambulatory Visit: Payer: Self-pay

## 2021-03-12 ENCOUNTER — Ambulatory Visit (HOSPITAL_COMMUNITY)
Admission: RE | Admit: 2021-03-12 | Discharge: 2021-03-12 | Disposition: A | Discharge status: Home / Self Care | Payer: Self-pay | Attending: Internal Medicine | Admitting: Internal Medicine

## 2021-03-12 DIAGNOSIS — Z923 Personal history of irradiation: Secondary | ICD-10-CM | POA: Insufficient documentation

## 2021-03-12 DIAGNOSIS — Z8249 Family history of ischemic heart disease and other diseases of the circulatory system: Secondary | ICD-10-CM | POA: Insufficient documentation

## 2021-03-12 DIAGNOSIS — C50912 Malignant neoplasm of unspecified site of left female breast: Secondary | ICD-10-CM | POA: Insufficient documentation

## 2021-03-12 DIAGNOSIS — Z79899 Other long term (current) drug therapy: Secondary | ICD-10-CM | POA: Insufficient documentation

## 2021-03-12 DIAGNOSIS — Z7901 Long term (current) use of anticoagulants: Secondary | ICD-10-CM | POA: Insufficient documentation

## 2021-03-12 DIAGNOSIS — Z9114 Patient's other noncompliance with medication regimen: Secondary | ICD-10-CM | POA: Insufficient documentation

## 2021-03-12 DIAGNOSIS — I428 Other cardiomyopathies: Secondary | ICD-10-CM | POA: Insufficient documentation

## 2021-03-12 DIAGNOSIS — C539 Malignant neoplasm of cervix uteri, unspecified: Secondary | ICD-10-CM | POA: Insufficient documentation

## 2021-03-12 DIAGNOSIS — I5022 Chronic systolic (congestive) heart failure: Secondary | ICD-10-CM | POA: Insufficient documentation

## 2021-03-12 DIAGNOSIS — I4819 Other persistent atrial fibrillation: Secondary | ICD-10-CM | POA: Insufficient documentation

## 2021-03-12 DIAGNOSIS — Z8616 Personal history of COVID-19: Secondary | ICD-10-CM | POA: Insufficient documentation

## 2021-03-12 HISTORY — PX: RIGHT HEART CATH: CATH118263

## 2021-03-12 LAB — POCT I-STAT EG7
Acid-base deficit: 3 mmol/L — ABNORMAL HIGH (ref 0.0–2.0)
Acid-base deficit: 3 mmol/L — ABNORMAL HIGH (ref 0.0–2.0)
Acid-base deficit: 4 mmol/L — ABNORMAL HIGH (ref 0.0–2.0)
Bicarbonate: 21.1 mmol/L (ref 20.0–28.0)
Bicarbonate: 20.8 mmol/L (ref 20.0–28.0)
Bicarbonate: 21.6 mmol/L (ref 20.0–28.0)
Calcium, Ion: 1.12 mmol/L — ABNORMAL LOW (ref 1.15–1.40)
Calcium, Ion: 1.1 mmol/L — ABNORMAL LOW (ref 1.15–1.40)
Calcium, Ion: 1.16 mmol/L (ref 1.15–1.40)
HCT: 36 % (ref 36.0–46.0)
HCT: 36 % (ref 36.0–46.0)
HCT: 36 % (ref 36.0–46.0)
Hemoglobin: 12.2 g/dL (ref 12.0–15.0)
Hemoglobin: 12.2 g/dL (ref 12.0–15.0)
Hemoglobin: 12.2 g/dL (ref 12.0–15.0)
O2 Saturation: 65 %
O2 Saturation: 67 %
O2 Saturation: 74 %
Potassium: 3.8 mmol/L (ref 3.5–5.1)
Potassium: 3.8 mmol/L (ref 3.5–5.1)
Potassium: 3.9 mmol/L (ref 3.5–5.1)
Sodium: 145 mmol/L (ref 135–145)
Sodium: 144 mmol/L (ref 135–145)
Sodium: 145 mmol/L (ref 135–145)
TCO2: 22 mmol/L (ref 22–32)
TCO2: 22 mmol/L (ref 22–32)
TCO2: 23 mmol/L (ref 22–32)
pCO2, Ven: 35 mmHg — ABNORMAL LOW (ref 44.0–60.0)
pCO2, Ven: 34.5 mmHg — ABNORMAL LOW (ref 44.0–60.0)
pCO2, Ven: 36.4 mmHg — ABNORMAL LOW (ref 44.0–60.0)
pH, Ven: 7.388 (ref 7.250–7.430)
pH, Ven: 7.381 (ref 7.250–7.430)
pH, Ven: 7.388 (ref 7.250–7.430)
pO2, Ven: 34 mmHg (ref 32.0–45.0)
pO2, Ven: 35 mmHg (ref 32.0–45.0)
pO2, Ven: 39 mmHg (ref 32.0–45.0)

## 2021-03-12 SURGERY — RIGHT HEART CATH
Anesthesia: LOCAL

## 2021-03-12 MED ORDER — SODIUM CHLORIDE 0.9% FLUSH: 3.0000 mL | Freq: Two times a day (BID) | INTRAVENOUS | Status: DC

## 2021-03-12 MED ORDER — SODIUM CHLORIDE 0.9% FLUSH: 3.0000 mL | INTRAVENOUS | Status: DC | PRN

## 2021-03-12 MED ORDER — SODIUM CHLORIDE 0.9 % IV SOLN: 250.0000 mL | INTRAVENOUS | Status: DC | PRN

## 2021-03-12 MED ORDER — METOLAZONE 2.5 MG PO TABS: 2.5000 mg | ORAL_TABLET | Freq: Every day | ORAL | 10 refills | Status: DC

## 2021-03-12 MED ORDER — FUROSEMIDE 80 MG PO TABS: 80.0000 mg | ORAL_TABLET | Freq: Every day | ORAL | 3 refills | Status: DC

## 2021-03-12 MED ORDER — LIDOCAINE HCL (PF) 1 % IJ SOLN
INTRAMUSCULAR | Status: AC
  Filled 2021-03-12: qty 30

## 2021-03-12 MED ORDER — ASPIRIN 81 MG PO CHEW: 81.0000 mg | CHEWABLE_TABLET | ORAL | Status: DC

## 2021-03-12 MED ORDER — HYDRALAZINE HCL 20 MG/ML IJ SOLN: 10.0000 mg | INTRAMUSCULAR | Status: DC | PRN

## 2021-03-12 MED ORDER — POTASSIUM CHLORIDE CRYS ER 20 MEQ PO TBCR: 40.0000 meq | EXTENDED_RELEASE_TABLET | Freq: Every day | ORAL | 3 refills | Status: DC

## 2021-03-12 MED ORDER — LABETALOL HCL 5 MG/ML IV SOLN: 10.0000 mg | INTRAVENOUS | Status: DC | PRN

## 2021-03-12 MED ORDER — SODIUM CHLORIDE 0.9 % IV SOLN: INTRAVENOUS | Status: DC

## 2021-03-12 MED ORDER — LIDOCAINE HCL (PF) 1 % IJ SOLN
INTRAMUSCULAR | Status: DC | PRN
  Administered 2021-03-12: 2 mL via SUBCUTANEOUS

## 2021-03-12 MED ORDER — ACETAMINOPHEN 325 MG PO TABS: 650.0000 mg | ORAL_TABLET | ORAL | Status: DC | PRN

## 2021-03-12 MED ORDER — HEPARIN (PORCINE) IN NACL 1000-0.9 UT/500ML-% IV SOLN
INTRAVENOUS | Status: AC
  Filled 2021-03-12: qty 500

## 2021-03-12 MED ORDER — ONDANSETRON HCL 4 MG/2ML IJ SOLN: 4.0000 mg | Freq: Four times a day (QID) | INTRAMUSCULAR | Status: DC | PRN

## 2021-03-12 MED ORDER — ASPIRIN 81 MG PO CHEW
81.0000 mg | CHEWABLE_TABLET | ORAL | Status: AC
  Administered 2021-03-12: 81 mg via ORAL
  Filled 2021-03-12: qty 1

## 2021-03-12 SURGICAL SUPPLY — 7 items
CATH BALLN WEDGE 5F 110CM (CATHETERS) ×2 IMPLANT
KIT MICROPUNCTURE NIT STIFF (SHEATH) ×2 IMPLANT
PACK CARDIAC CATHETERIZATION (CUSTOM PROCEDURE TRAY) ×2 IMPLANT
SHEATH GLIDE SLENDER 4/5FR (SHEATH) ×2 IMPLANT
SHEATH PROBE COVER 6X72 (BAG) ×2 IMPLANT
TRANSDUCER W/STOPCOCK (MISCELLANEOUS) ×2 IMPLANT
WIRE EMERALD 3MM-J .025X260CM (WIRE) ×2 IMPLANT

## 2021-03-12 NOTE — Interval H&P Note (Signed)
History and Physical Interval Note:  03/12/2021 11:40 AM  Megan Keith  has presented today for surgery, with the diagnosis of heart failure.  The various methods of treatment have been discussed with the patient and family. After consideration of risks, benefits and other options for treatment, the patient has consented to  Procedure(s): RIGHT HEART CATH (N/A) as a surgical intervention.  The patient's history has been reviewed, patient examined, no change in status, stable for surgery.  I have reviewed the patient's chart and labs.  Questions were answered to the patient's satisfaction.     Saba Neuman

## 2021-03-12 NOTE — Discharge Instructions (Signed)
You may remove your bandage in 24 hours. You may shower after bandage removal. Watch site for signs of infections. Do not lift more than 10 lbs for 2 days. If site bleeds, hold pressure for 5-10 minutes.

## 2021-03-13 MED FILL — Heparin Sod (Porcine)-NaCl IV Soln 1000 Unit/500ML-0.9%: INTRAVENOUS | Qty: 500 | Status: AC

## 2021-03-23 NOTE — Progress Notes (Addendum)
Advanced Heart Failure Clinic Note   PCP: Pcp, No HF Cardiologist: Glori Bickers, MD  EP: Dr. Rayann Heman   Reason for Visit:f/u for Chronic Systolic Heart Failure   HPI: Megan Keith is a 60 y.o.female with chronic systolic CHF (Echo EF 0000000 in 03/2017)  PAF, and hyperthyroidisum.     Admitted to [facility] 8/18 with ADHF. Prior EF with Dr. [name] was 35%. Repeat echo EF 15%. Transferred to [facility]. R/LHC. no CAD. Fick output/index 5.3/3.3. Noted to have >20% PVC on telemetry. Started on amiodarone which she susequently stopped.   Echo 11/19 25-30%   Admitted 7/20 with ADHF in setting of new-onset AF, in the setting of hyperthyroidism. TEE with EF 10% RV ok. Started on amiodarone and diuresed. Underwent TEE/DC-CV and felt much better. Also treated with methimazole and prednisone.   Zio patch placed to monitor PVC burden.    Zio patch 8/20 - NSR avg HR 75 - 13 runs NSVT longest 14 beats - 59 runs SVT longest 9.4 seconds - PVC burden 4.8%   Admitted 10/20 for COVID 19 infection c/b recurrent afib w/ RVR. Was loaded on IV amiodarone and rate improved but did not convert back to NSR. Was placed on Eliquis and discharged home on PO digoxin and metoprolol. Was advised to f/u in [facility] for further management.    Admitted from HF clinic 11/20 with A Fib RVR -> ADHF.Methimazole and prednisone continued for hyperthyroidism.  EP was consulted. Initial plans were for PVI ablation but procedure canceled after TEE showed severely reduced EF at 5% and severe LAE. Had DC-CV with conversion to NSR.  She was also treated w/ Losartan (BP too soft for Entesto), spironolactone and digoxin. Overall not felt to be a great candidate for advanced therapies, appeared to be quite noncompliant w/ meds.  Saw Dr. Rayann Heman on 10/22/19 and was still in NSR. Discussed possible AF ablation. There was concern over degree of MR and LAE.   She finished treatment for hyperthyroidism with Dr. [name] 5/21.     Admitted [name] 9/21 She was reportedly treated for mild hemoptysis in setting of PNA and CHF. Treated w/ abx and lasix. Had low BP and was taken off losartan and Coreg.    11/21 High PVC burden so Mexitil 200 mg bid started.   Repeat Echo (4/22) EF 20-25%   Zio (5/22) (on mexiletine). Occasional PVCs, burden low at 3.3%. Also had 32 runs of SVT, the longest lasting 10 beats w/ average HR 104 bpm.   Diagnosed w/ cervical cancer 7/22.  Underwent RHC 03/12/21 after having increased fatigue/decreased exercise tolerance; concern for low output. Found to have  markedly elevated filling pressures and preserved CO (see below). She was given metolazone 2.5 x 2 days and lasix increased to 60 mg daily.  Today she returns for HF follow up and pre-op cardiac clearance for up-coming total hysterectomy. She is here with her granddaughter and interpretor. SOB on stairs and walking further distances. Says her feet are starting to swell. Some dizziness. No falls or passing out. Denies PND/orthopnea or palpitations. Appetite ok. Weight at home 148 pounds. She has been taking lasix 80 mg daily. Feels she is not urinating as much. Started abx yesterday for cervicitis per GYN. She has been out of mexiletine for 2 months.  Cardiac Studies   Echo (8/18): EF 10-15% Echo (12/18): EF 25-30%  Echo (4/19): EF 25-30% Echo (11/19): EF ~30% (per Dr Haroldine Laws) Echo (8/20): EF 25-30%  TEE (11/20): EF 5-10% Echo (2/21):  EF 30-35% moderate MR Echo (5/21): EF 25-30%. RV normal.  Echo (11/21): EF 20-25% RV normal.  - RHC (7/22): Elevated filling pressures w/ preserved CO; prominent v-waves suggesting significant MR vs. diastolic dysfunction.   RA = 3 RV = 57/5 PA = 58/28 (43) PCW = 30 (v = 45) Fick cardiac output/index = 4.6/2.8 PVR = 2.8 WU FA sat = 95% PA sat = 67%, 69% SVC = 74%   Assessment: Markedly elevated filling pressures with preserved cardiac output. Prominent v-waves in the PCWP tracing suggestive  of significant MR vs diastolic dysfunction   R/LHC (8/18): normal cors  Right Heart RA (mean): 5 mmHg RV (S/EDP): 52/4 mmHg PA (S/D, mean): 52/20 (31) mmHg PCWP (mean): 30 mmHg Ao sat: 96% PA sat: 71% RA sat: 74% Fick CO: 5.3 L/min Fick CI: 3.3 L/min/m^2 SVR: 1,101 dynscm-5  - Zio patch (8/20): - NSR avg HR 75 - 13 runs NSVT longest 14 beats - 59 runs SVT longest 9.4 seconds - PVC burden 4.8%  - Zio Patch (5/22): 1. Sinus rhythm - avg HR of 79 bpm. 2. 32 runs of SVT - the longest lasting 10 beats with an avg rate of 104 bpm 3. 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 ? PVC cardiomyopathy. right and left heart catheterization on 04/15/17, no CAD. Fick output/index 5.28/3.34.   Noncompliance    a. episodic noncompliance.   Pneumonia 06/2016   Current Outpatient Medications  Medication Sig Dispense Refill   acetaminophen (TYLENOL) 650 MG CR tablet Take by mouth every 8 (eight) hours as needed.     amiodarone (PACERONE) 100 MG tablet Take 1 tablet (100 mg total) by mouth daily. 30 tablet 3   anastrozole (ARIMIDEX) 1 MG tablet Take 1 mg by mouth in the morning. Patient takes it at night.     apixaban (ELIQUIS) 5 MG TABS tablet Take 1 tablet (5 mg total) by mouth 2 (two) times daily. 60 tablet 6   carvedilol (COREG) 3.125 MG tablet Take 1 tablet (3.125 mg total) by mouth 2 (two) times daily. 60 tablet 6   furosemide (LASIX) 40 MG tablet Take 40 mg by mouth daily.     omeprazole (PRILOSEC) 20  MG capsule Take 20 mg by mouth in the morning.     spironolactone (ALDACTONE) 25 MG tablet Take 1 tablet (25 mg total) by mouth daily. 30 tablet 6   No current facility-administered medications for this encounter.   No Known Allergies  Social History   Socioeconomic History   Marital status: Single    Spouse name: Not on file   Number of children: Not on file   Years of education: Not on file   Highest education level: Not on file  Occupational History   Not on file  Tobacco Use   Smoking status: Never   Smokeless tobacco: Never  Vaping Use   Vaping Use: Never used  Substance and Sexual Activity   Alcohol use: Not Currently    Comment: 04/15/2017 "might have a couple beers q couple months"   Drug use: Not Currently   Sexual activity: Not Currently  Other Topics Concern   Not on file  Social History Narrative   ** Merged History Encounter **       N/A   Social Determinants of Health   Financial Resource Strain: Not on file  Food Insecurity: Not on file  Transportation Needs: Not on file  Physical Activity: Not on file  Stress: Not on file  Social Connections: Not on file  Intimate Partner Violence: Not on file   Family History  Problem Relation Age of Onset   Hypertension Mother    BP 100/70   Pulse 68   Wt 68.1 kg (150 lb 3.2 oz)   BMI 29.33 kg/m   Wt Readings from Last 3 Encounters:  03/24/21 68.1 kg (150 lb 3.2 oz)  03/12/21 67.1 kg (148 lb)  03/10/21 69 kg (152 lb 3.2 oz)   PHYSICAL EXAM: General:  NAD. No resp difficulty HEENT: Normal Neck: Supple. No JVD. Carotids 2+ bilat; no bruits. No lymphadenopathy or thryomegaly appreciated. Cor: PMI nondisplaced. Regular rate & rhythm. No rubs, gallops or murmurs. Lungs: Clear, diminished in bases. Abdomen: Obese, nontender, nondistended. No hepatosplenomegaly. No bruits or masses. Good bowel sounds. Extremities: No cyanosis, clubbing, rash, edema Neuro: Alert & oriented x 3, cranial nerves grossly intact.  Moves all 4 extremities w/o difficulty. Affect pleasant.  EKG: SR 82 bpm  ReDs: 34%  ASSESSMENT & PLAN:  1. Chronic systolic CHF: NICM, normal cors in 03/2017. 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  - Echo (03/2017):  EF 15-20%  - Echo (07/28/17): EF 25-30%  - Echo (06/2018): EF ~30% - TEE (11/20): EF 5% in setting of AF - Echo (2/21): EF 30-35% - Echo (5/21): EF 25-30%. RV ok  - Echo (07/08/20): EF 20-25% RV ok - Echo (4/22): 20-25%  - RHC (7/22): markedly elevated filling pressures and preserved CO, prominent v-waves - NYHA III, Volume ok today, ReDs Clip 34%  - Ok to keep lasix at 80 mg daily. - Continue Coreg 3.125 mg bid. - Continue spiro 25 mg daily.  - Intolerant to [place].  - BP has been too low for ARB/ARNI. - Have not proceeded with ICD given non-compliance and hope that EF would improve with rhythm management.  - BMET and BNP today.  2. PAF  - S/p previous DC-CV in 7/20. 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   - Previously was on amio for PVCs but developed hyperthyroidism, now being treated w/ methimazole.  - Admitted 06/2019 for persistent afib w/ RVR + a/c systolic CHF. Loaded w/ IV amiodarone and evaluated by EP. Initial plans were for PVI ablation but procedure canceled after TEE showed severely reduced EF at 5% and severe LAE. Pt underwent DCCV instead.   - Saw Dr. Rayann Heman again in 2/21. Planned for AF ablation but does not have insurance coverage for AF ablation. Continue medical treatment. - Remains in NSR today on ECG. - Continue amio 100 mg daily. - Continue Eliquis 5 mg bid. - Recent TFTs & LFTs ok. - No bleeding issues. - CBC today.  3. MR - Prominent v-waves in PCWP tracing on RHC 7/22 - No appreciable mumur noted on exam. - Needs TEE soon to evaluate severity of MR vs diastolic dysfunction.  4. 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  - Per Dr. Jasmine December, no further intervention needed.    5. H/o Frequent PVCs - Zio patch (8/20) with 5% PVC burden; Zio patch (3 days)  -11/21 with 16% PVC burden. - Zio (5/22) w/ only occasional PVCs, burden 3.3% on amio + mexilitine. - No PVCs on ECG today.  - Continue amio. - Restart mexilitine. Personally discussed with Dr. Haroldine Laws.  6. Left Breast Cancer. - Treated w/ XRT.   7. Medication noncompliance - Still an issue.  - Discrepencies with lasix and mexilitine noted today.  8. Adenocarcinoma in situ of cervix;  - Followed by [facility]. - S/p conization of cervix/ D&C. - She is to undergo total hysterectomy 05/07/2021, planning robotic laparoscopic transvaginally under spinal anesthesia.  9. Pre-Op Cardiac Clearance - OK to proceed with surgery.  - She is at moderate to high risk due to her cardiac co morbidities. (See comments below)  Follow up with Dr. Haroldine Laws after TEE.   Allen, FNP 03/24/21  Patient seen and examined with the above-signed Advanced Practice Provider and/or Housestaff. I personally reviewed laboratory data, imaging studies and relevant notes. I independently examined the patient and formulated the important aspects of the plan. I have edited the note to reflect any of my changes or salient points. I have personally discussed the plan with the patient and/or family.  Recently underwent RHC which showed markedly elevated filling pressures with preserved cardiac output. Lasix increased. Says she feels good today. Can do most activities without too much problem. Able to go to store, clean her house without [place]. Reports compliance over meds.  Recently found to have cervical cancer and will require hysterectomy by Dr. Denman George [facility]) with GA   General:  Well appearing. No resp difficulty HEENT: normal Neck: supple. JVP 5. Carotids 2+ bilat; no bruits. No lymphadenopathy or thryomegaly appreciated. Cor: PMI nondisplaced. Regular rate & rhythm. No rubs, gallops or murmurs. Lungs: clear Abdomen: soft,  nontender, nondistended. No hepatosplenomegaly. No bruits or masses. Good bowel sounds. Extremities: no cyanosis, clubbing, rash, edema Neuro: alert & orientedx3, cranial nerves grossly intact. moves all 4 extremities w/o difficulty. Affect pleasant  Despite high filling pressures on recent RHC she is only minimally symptomatic with preserved functional capacity (NYHA II). I have reviewed her echo and suspect she may have a component of restriction though her RA pressures were quite low on recent RHC. She has at least moderate MR as well. We have wanted to get a cMRI to further evaluate the etiology of her cardiomyopathy but she has been unable to afford due to lack of insurance. We will plan TEE to further evaluate the degree of her MR based on prominent v waves in PCWP tracing..   With regards to her upcoming surgery, I feel that she is at moderate to high risk for peri-operative CV complications due to her low EF but given her preserved functional capacity I think the risk is acceptable and surgery should proceed given the need for treatment of her cervical cancer. We discussed this today at length through an interpreter. She was tearful about her cancer but clearly understood the discussion and wanted to proceed.   I also discussed with Dr. Denman George today and confirmed that the procedure would be done as an inpatient with close cardiology f/u as needed post-operatively to manage fluid shifts/overload and possible recurrent AF.   OK to stop Eliquis for surgery.   Total time spent 45 minutes. Over half that time  spent discussing above.   Glori Bickers, MD  10:49 PM

## 2021-03-24 ENCOUNTER — Other Ambulatory Visit: Payer: Self-pay

## 2021-03-24 ENCOUNTER — Encounter (HOSPITAL_COMMUNITY): Payer: Self-pay

## 2021-03-24 ENCOUNTER — Ambulatory Visit (HOSPITAL_COMMUNITY)
Admission: RE | Admit: 2021-03-24 | Discharge: 2021-03-24 | Disposition: A | Discharge status: Home / Self Care | Payer: Self-pay | Source: Ambulatory Visit | Attending: Family Medicine | Admitting: Family Medicine

## 2021-03-24 VITALS — BP 100/70 | HR 68 | Wt 150.2 lb

## 2021-03-24 DIAGNOSIS — Z853 Personal history of malignant neoplasm of breast: Secondary | ICD-10-CM | POA: Insufficient documentation

## 2021-03-24 DIAGNOSIS — I480 Paroxysmal atrial fibrillation: Secondary | ICD-10-CM

## 2021-03-24 DIAGNOSIS — Z01810 Encounter for preprocedural cardiovascular examination: Secondary | ICD-10-CM

## 2021-03-24 DIAGNOSIS — Z8616 Personal history of COVID-19: Secondary | ICD-10-CM | POA: Insufficient documentation

## 2021-03-24 DIAGNOSIS — I428 Other cardiomyopathies: Secondary | ICD-10-CM | POA: Insufficient documentation

## 2021-03-24 DIAGNOSIS — Z7901 Long term (current) use of anticoagulants: Secondary | ICD-10-CM | POA: Insufficient documentation

## 2021-03-24 DIAGNOSIS — I5022 Chronic systolic (congestive) heart failure: Secondary | ICD-10-CM | POA: Insufficient documentation

## 2021-03-24 DIAGNOSIS — C539 Malignant neoplasm of cervix uteri, unspecified: Secondary | ICD-10-CM | POA: Insufficient documentation

## 2021-03-24 DIAGNOSIS — Z79899 Other long term (current) drug therapy: Secondary | ICD-10-CM | POA: Insufficient documentation

## 2021-03-24 DIAGNOSIS — I493 Ventricular premature depolarization: Secondary | ICD-10-CM | POA: Insufficient documentation

## 2021-03-24 DIAGNOSIS — Z923 Personal history of irradiation: Secondary | ICD-10-CM | POA: Insufficient documentation

## 2021-03-24 DIAGNOSIS — Z9114 Patient's other noncompliance with medication regimen: Secondary | ICD-10-CM | POA: Insufficient documentation

## 2021-03-24 DIAGNOSIS — I4819 Other persistent atrial fibrillation: Secondary | ICD-10-CM | POA: Insufficient documentation

## 2021-03-24 DIAGNOSIS — C50412 Malignant neoplasm of upper-outer quadrant of left female breast: Secondary | ICD-10-CM

## 2021-03-24 DIAGNOSIS — Z8249 Family history of ischemic heart disease and other diseases of the circulatory system: Secondary | ICD-10-CM | POA: Insufficient documentation

## 2021-03-24 DIAGNOSIS — I4891 Unspecified atrial fibrillation: Secondary | ICD-10-CM

## 2021-03-24 DIAGNOSIS — E0590 Thyrotoxicosis, unspecified without thyrotoxic crisis or storm: Secondary | ICD-10-CM | POA: Insufficient documentation

## 2021-03-24 LAB — BASIC METABOLIC PANEL
Anion gap: 8 (ref 5–15)
BUN: 6 mg/dL (ref 6–20)
CO2: 22 mmol/L (ref 22–32)
Calcium: 8.9 mg/dL (ref 8.9–10.3)
Chloride: 106 mmol/L (ref 98–111)
Creatinine, Ser: 0.6 mg/dL (ref 0.44–1.00)
GFR, Estimated: >60 mL/min (ref >60)
Glucose, Bld: 97 mg/dL (ref 70–99)
Potassium: 4.2 mmol/L (ref 3.5–5.1)
Sodium: 136 mmol/L (ref 135–145)

## 2021-03-24 LAB — CBC
HCT: 41.4 % (ref 36.0–46.0)
Hemoglobin: 13.1 g/dL (ref 12.0–15.0)
MCH: 29.3 pg (ref 26.0–34.0)
MCHC: 31.6 g/dL (ref 30.0–36.0)
MCV: 92.6 fL (ref 80.0–100.0)
Platelets: 373 10*3/uL (ref 150–400)
RBC: 4.47 MIL/uL (ref 3.87–5.11)
RDW: 18.8 % — ABNORMAL HIGH (ref 11.5–15.5)
WBC: 17.5 10*3/uL — ABNORMAL HIGH (ref 4.0–10.5)
nRBC: 0 % (ref 0.0–0.2)

## 2021-03-24 LAB — BRAIN NATRIURETIC PEPTIDE: B Natriuretic Peptide: 894.6 pg/mL — ABNORMAL HIGH (ref 0.0–100.0)

## 2021-03-24 MED ORDER — MEXILETINE HCL 200 MG PO CAPS
200.0000 mg | ORAL_CAPSULE | Freq: Two times a day (BID) | ORAL | 6 refills | Status: DC
Start: 2021-03-24 — End: 2021-03-24

## 2021-03-24 MED ORDER — MEXILETINE HCL 200 MG PO CAPS: 200.0000 mg | ORAL_CAPSULE | Freq: Two times a day (BID) | ORAL | 6 refills | Status: DC

## 2021-03-24 NOTE — Progress Notes (Signed)
ReDS Vest / Clip - 03/24/21 0900       ReDS Vest / Clip   Station Marker A    Ruler Value 32    ReDS Value Range Low volume    ReDS Actual Value 34

## 2021-03-24 NOTE — Patient Instructions (Signed)
RESTART Mexiletine 200 mg, one tab twice a day  Labs today We will only contact you if something comes back abnormal or we need to make some changes. Otherwise no news is good news!  Your physician has requested that you have a TEE. During a TEE, sound waves are used to create images of your heart. It provides your doctor with information about the size and shape of your heart and how well your heart's chambers and valves are working. In this test, a transducer is attached to the end of a flexible tube that's guided down your throat and into your esophagus (the tube leading from you mouth to your stomach) to get a more detailed image of your heart. You are not awake for the procedure. Please see the instruction sheet given to you today. For further information please visit [email or web address].  Your physician recommends that you schedule a follow-up appointment in: 6-8 weeks with Dr Haroldine Laws   You are scheduled for a TEE on Tuesday August 30,2022 with Dr. Haroldine Laws.  Please arrive at the [facility] (Main Entrance A) at [facility]: [address] [place], [place] [address] at 8:30 am. (1 hour prior to procedure unless lab work is needed; if lab work is needed arrive 1.5 hours ahead)  DIET: Nothing to eat or drink after midnight except a sip of water with medications (see medication instructions below)  Medication Instructions: Hold Lasix on the morning of your procedure  Continue your anticoagulant: eliquis You will need to continue your anticoagulant after your procedure until you  are told by your  Provider that it is safe to stop   Labs: Pre procedure labs done today  You must have a responsible person to drive you home and stay in the waiting area during your procedure. Failure to do so could result in cancellation.  Bring your insurance cards.  *Special Note: Every effort is made to have your procedure done on time. Occasionally there are emergencies that occur at the  hospital that may cause delays. Please be patient if a delay does occur.

## 2021-03-25 ENCOUNTER — Encounter: Payer: Self-pay | Admitting: Oncology

## 2021-03-25 MED ORDER — ANASTROZOLE 1 MG PO TABS: 1.0000 mg | ORAL_TABLET | Freq: Every morning | ORAL | 0 refills | Status: DC

## 2021-04-01 ENCOUNTER — Ambulatory Visit (INDEPENDENT_AMBULATORY_CARE_PROVIDER_SITE_OTHER): Payer: Self-pay | Admitting: Internal Medicine

## 2021-04-01 ENCOUNTER — Other Ambulatory Visit: Payer: Self-pay

## 2021-04-01 ENCOUNTER — Encounter: Payer: Self-pay | Admitting: Internal Medicine

## 2021-04-01 VITALS — BP 134/70 | HR 93 | Ht 60.0 in | Wt 149.7 lb

## 2021-04-01 DIAGNOSIS — I4819 Other persistent atrial fibrillation: Secondary | ICD-10-CM

## 2021-04-01 DIAGNOSIS — I5022 Chronic systolic (congestive) heart failure: Secondary | ICD-10-CM

## 2021-04-01 DIAGNOSIS — I340 Nonrheumatic mitral (valve) insufficiency: Secondary | ICD-10-CM

## 2021-04-01 DIAGNOSIS — I493 Ventricular premature depolarization: Secondary | ICD-10-CM

## 2021-04-01 NOTE — H&P (View-Only) (Signed)
PCP: Pcp, No Primary Cardiologist: Dr Haroldine Laws Primary EP: Dr Toya Smothers Megan Keith is a 60 y.o. female who presents today for routine electrophysiology followup.  Since last being seen in our clinic, the patient reports doing reasonably well. Her afib has been suprisingly controlled.   She is following in the heart failure clinic.  Medical therapy has been challenging due to nonadherance per notes.  Today, she denies symptoms of palpitations, chest pain,   lower extremity edema, dizziness, presyncope, or syncope.  The patient is otherwise without complaint today.   Past Medical History:  Diagnosis Date   A-fib ([place])    Abnormal TSH    Anemia    Arthritis    "knees, hands" (04/15/2017)   Breast cancer (HCC)    CHF (congestive heart failure) (HCC)    Chronic knee pain    Chronic systolic CHF (congestive heart failure) ([place])    COVID-19    Does not have health insurance    Frequent PVCs    Hyperthyroidism 03/05/2019   Hyperthyroidism 03/05/2019   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 ([place]) 04/15/2017   NICM (nonischemic cardiomyopathy) ([place])    a. ? PVC cardiomyopathy. right and left heart catheterization on 04/15/17, no CAD. Fick output/index 5.28/3.34.   Noncompliance    a. episodic noncompliance.   Pneumonia 06/2016   Past Surgical History:  Procedure Laterality Date   BREAST CYST EXCISION Left    CARDIOVERSION N/A 03/09/2019   Procedure: CARDIOVERSION;  Surgeon: Jolaine Artist, MD;  Location: [facility] ENDOSCOPY;  Service: Cardiovascular;  Laterality: N/A;   CARDIOVERSION N/A 07/05/2019   Procedure: CARDIOVERSION;  Surgeon: Thompson Grayer, MD;  Location: [place] CV LAB;  Service: Cardiovascular;  Laterality: N/A;   CORONARY/GRAFT ANGIOGRAPHY N/A 04/15/2017   Procedure: CORONARY/GRAFT ANGIOGRAPHY;  Surgeon: Nelva Bush, MD;  Location: [place] CV LAB;  Service: Cardiovascular;  Laterality: N/A;   RIGHT HEART CATH N/A 04/15/2017   Procedure: RIGHT HEART CATH;   Surgeon: Nelva Bush, MD;  Location: [place] CV LAB;  Service: Cardiovascular;  Laterality: N/A;   RIGHT HEART CATH N/A 03/12/2021   Procedure: RIGHT HEART CATH;  Surgeon: Jolaine Artist, MD;  Location: [place] CV LAB;  Service: Cardiovascular;  Laterality: N/A;   TEE WITHOUT CARDIOVERSION N/A 03/09/2019   Procedure: TRANSESOPHAGEAL ECHOCARDIOGRAM (TEE);  Surgeon: Jolaine Artist, MD;  Location: [facility] ENDOSCOPY;  Service: Cardiovascular;  Laterality: N/A;   TEE WITHOUT CARDIOVERSION N/A 07/05/2019   Procedure: TRANSESOPHAGEAL ECHOCARDIOGRAM (TEE);  Surgeon: Thompson Grayer, MD;  Location: [place] CV LAB;  Service: Cardiovascular;  Laterality: N/A;   TUBAL LIGATION      ROS- all systems are reviewed and negatives except as per HPI above  Current Outpatient Medications  Medication Sig Dispense Refill   acetaminophen (TYLENOL) 650 MG CR tablet Take by mouth every 8 (eight) hours as needed.     amiodarone (PACERONE) 100 MG tablet Take 1 tablet (100 mg total) by mouth daily. 30 tablet 3   anastrozole (ARIMIDEX) 1 MG tablet Take 1 tablet (1 mg total) by mouth in the morning. Patient takes it at night. 90 tablet 0   apixaban (ELIQUIS) 5 MG TABS tablet Take 1 tablet (5 mg total) by mouth 2 (two) times daily. 60 tablet 6   carvedilol (COREG) 3.125 MG tablet Take 1 tablet (3.125 mg total) by mouth 2 (two) times daily. 60 tablet 6   furosemide (LASIX) 40 MG tablet Take 80 mg by mouth daily.  omeprazole (PRILOSEC) 20 MG capsule Take 20 mg by mouth in the morning.     spironolactone (ALDACTONE) 25 MG tablet Take 1 tablet (25 mg total) by mouth daily. 30 tablet 6   mexiletine (MEXITIL) 200 MG capsule Take 1 capsule (200 mg total) by mouth 2 (two) times daily. (Patient not taking: Reported on 04/01/2021) 60 capsule 6   No current facility-administered medications for this visit.    Physical Exam: Vitals:   04/01/21 1528  BP: 134/70  Pulse: 93  SpO2: 98%  Weight: 149 lb 11.5 oz  (67.9 kg)  Height: 5' (1.524 m)    GEN- The patient is well appearing, alert  Head- normocephalic, atraumatic Eyes-  Sclera clear, conjunctiva pink Ears- hearing intact Oropharynx- clear Lungs- Clear to ausculation bilaterally, normal work of breathing Heart- Regular rate and rhythm,  GI- soft, NT, ND, + BS Extremities- no clubbing, cyanosis, or edema  Wt Readings from Last 3 Encounters:  04/01/21 149 lb 11.5 oz (67.9 kg)  03/24/21 150 lb 3.2 oz (68.1 kg)  03/12/21 148 lb (67.1 kg)    EKG tracing ordered today is personally reviewed and shows sinus, QRS 92 msec  Assessment and Plan:  Nonischemic CM/ chronic systolic dysfunction Follows in CHF clinic. Per notes, BP has previously been too low for ARB/ANRI, though currently BP may tolerate. She has ongoing treatment for active cervical cancer with TAH planned.  She also has TEE planned for further evaluation of her MR/ pulmonary HTN. On long discussion with the patient and her granddaughter, the patient is clear that she would like to avoid ICD at this time. I would also favor a more conservative approach at this time.  2. Paroxysmal atrial fibrillation Currently controlled with low dose amiodarone No change is advised at this time.  3. PVCs Burden is low by recent Zio 5/22 I am not convinced that mexiletine would offer any benefit.  Presently, she cannot afford this medcine  No new recommendations Return as needed  Thompson Grayer MD, [facility] 04/01/2021 3:42 PM

## 2021-04-01 NOTE — Progress Notes (Signed)
PCP: Pcp, No Primary Cardiologist: Dr Haroldine Laws Primary EP: Dr Toya Smothers George Megan Keith is a 60 y.o. female who presents today for routine electrophysiology followup.  Since last being seen in our clinic, the patient reports doing reasonably well. Her afib has been suprisingly controlled.   She is following in the heart failure clinic.  Medical therapy has been challenging due to nonadherance per notes.  Today, she denies symptoms of palpitations, chest pain,   lower extremity edema, dizziness, presyncope, or syncope.  The patient is otherwise without complaint today.   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 ? PVC cardiomyopathy. right and left heart catheterization on 04/15/17, no CAD. Fick output/index 5.28/3.34.   Noncompliance    a. episodic noncompliance.   Pneumonia 06/2016   Past Surgical History:  Procedure Laterality Date   BREAST CYST EXCISION Left    CARDIOVERSION N/A 03/09/2019   Procedure: CARDIOVERSION;  Surgeon: Jolaine Artist, MD;  Location: [facility] ENDOSCOPY;  Service: Cardiovascular;  Laterality: N/A;   CARDIOVERSION N/A 07/05/2019   Procedure: CARDIOVERSION;  Surgeon: Thompson Grayer, MD;  Location: [place] CV LAB;  Service: Cardiovascular;  Laterality: N/A;   CORONARY/GRAFT ANGIOGRAPHY N/A 04/15/2017   Procedure: CORONARY/GRAFT ANGIOGRAPHY;  Surgeon: Nelva Bush, MD;  Location: [place] CV LAB;  Service: Cardiovascular;  Laterality: N/A;   RIGHT HEART CATH N/A 04/15/2017   Procedure: RIGHT HEART CATH;   Surgeon: Nelva Bush, MD;  Location: [place] CV LAB;  Service: Cardiovascular;  Laterality: N/A;   RIGHT HEART CATH N/A 03/12/2021   Procedure: RIGHT HEART CATH;  Surgeon: Jolaine Artist, MD;  Location: [place] CV LAB;  Service: Cardiovascular;  Laterality: N/A;   TEE WITHOUT CARDIOVERSION N/A 03/09/2019   Procedure: TRANSESOPHAGEAL ECHOCARDIOGRAM (TEE);  Surgeon: Jolaine Artist, MD;  Location: [facility] ENDOSCOPY;  Service: Cardiovascular;  Laterality: N/A;   TEE WITHOUT CARDIOVERSION N/A 07/05/2019   Procedure: TRANSESOPHAGEAL ECHOCARDIOGRAM (TEE);  Surgeon: Thompson Grayer, MD;  Location: [place] CV LAB;  Service: Cardiovascular;  Laterality: N/A;   TUBAL LIGATION      ROS- all systems are reviewed and negatives except as per HPI above  Current Outpatient Medications  Medication Sig Dispense Refill   acetaminophen (TYLENOL) 650 MG CR tablet Take by mouth every 8 (eight) hours as needed.     amiodarone (PACERONE) 100 MG tablet Take 1 tablet (100 mg total) by mouth daily. 30 tablet 3   anastrozole (ARIMIDEX) 1 MG tablet Take 1 tablet (1 mg total) by mouth in the morning. Patient takes it at night. 90 tablet 0   apixaban (ELIQUIS) 5 MG TABS tablet Take 1 tablet (5 mg total) by mouth 2 (two) times daily. 60 tablet 6   carvedilol (COREG) 3.125 MG tablet Take 1 tablet (3.125 mg total) by mouth 2 (two) times daily. 60 tablet 6   furosemide (LASIX) 40 MG tablet Take 80 mg by mouth daily.  omeprazole (PRILOSEC) 20 MG capsule Take 20 mg by mouth in the morning.     spironolactone (ALDACTONE) 25 MG tablet Take 1 tablet (25 mg total) by mouth daily. 30 tablet 6   mexiletine (MEXITIL) 200 MG capsule Take 1 capsule (200 mg total) by mouth 2 (two) times daily. (Patient not taking: Reported on 04/01/2021) 60 capsule 6   No current facility-administered medications for this visit.    Physical Exam: Vitals:   04/01/21 1528  BP: 134/70  Pulse: 93  SpO2: 98%  Weight: 149 lb 11.5 oz  (67.9 kg)  Height: 5' (1.524 m)    GEN- The patient is well appearing, alert  Head- normocephalic, atraumatic Eyes-  Sclera clear, conjunctiva pink Ears- hearing intact Oropharynx- clear Lungs- Clear to ausculation bilaterally, normal work of breathing Heart- Regular rate and rhythm,  GI- soft, NT, ND, + BS Extremities- no clubbing, cyanosis, or edema  Wt Readings from Last 3 Encounters:  04/01/21 149 lb 11.5 oz (67.9 kg)  03/24/21 150 lb 3.2 oz (68.1 kg)  03/12/21 148 lb (67.1 kg)    EKG tracing ordered today is personally reviewed and shows sinus, QRS 92 msec  Assessment and Plan:  Nonischemic CM/ chronic systolic dysfunction Follows in CHF clinic. Per notes, BP has previously been too low for ARB/ANRI, though currently BP may tolerate. She has ongoing treatment for active cervical cancer with TAH planned.  She also has TEE planned for further evaluation of her MR/ pulmonary HTN. On long discussion with the patient and her granddaughter, the patient is clear that she would like to avoid ICD at this time. I would also favor a more conservative approach at this time.  2. Paroxysmal atrial fibrillation Currently controlled with low dose amiodarone No change is advised at this time.  3. PVCs Burden is low by recent Zio 5/22 I am not convinced that mexiletine would offer any benefit.  Presently, she cannot afford this medcine  No new recommendations Return as needed  Thompson Grayer MD, [facility] 04/01/2021 3:42 PM

## 2021-04-01 NOTE — Patient Instructions (Addendum)
Medication Instructions:  Your physician recommends that you continue on your current medications as directed. Please refer to the Current Medication list given to you today.  Labwork: None ordered.  Testing/Procedures: None ordered.  Follow-Up: Your physician wants you to follow-up in: As needed with Thompson Grayer, MD    Any Other Special Instructions Will Be Listed Below (If Applicable).  If you need a refill on your cardiac medications before your next appointment, please call your pharmacy.

## 2021-04-03 ENCOUNTER — Other Ambulatory Visit (HOSPITAL_COMMUNITY): Payer: Self-pay | Admitting: Internal Medicine

## 2021-04-15 ENCOUNTER — Other Ambulatory Visit (HOSPITAL_COMMUNITY): Payer: Self-pay | Admitting: *Deleted

## 2021-04-15 DIAGNOSIS — I340 Nonrheumatic mitral (valve) insufficiency: Secondary | ICD-10-CM

## 2021-04-21 ENCOUNTER — Ambulatory Visit (HOSPITAL_BASED_OUTPATIENT_CLINIC_OR_DEPARTMENT_OTHER): Payer: Self-pay

## 2021-04-21 ENCOUNTER — Encounter (HOSPITAL_COMMUNITY): Payer: Self-pay | Admitting: Internal Medicine

## 2021-04-21 ENCOUNTER — Other Ambulatory Visit: Payer: Self-pay

## 2021-04-21 ENCOUNTER — Ambulatory Visit (HOSPITAL_COMMUNITY)
Admission: RE | Admit: 2021-04-21 | Discharge: 2021-04-21 | Disposition: A | Discharge status: Home / Self Care | Payer: Self-pay | Attending: Internal Medicine | Admitting: Internal Medicine

## 2021-04-21 ENCOUNTER — Ambulatory Visit (HOSPITAL_COMMUNITY): Payer: Self-pay | Admitting: Certified Registered Nurse Anesthetist

## 2021-04-21 ENCOUNTER — Encounter (HOSPITAL_COMMUNITY)
Admission: RE | Disposition: A | Discharge status: Home / Self Care | Payer: Self-pay | Source: Home / Self Care | Attending: Internal Medicine

## 2021-04-21 DIAGNOSIS — I340 Nonrheumatic mitral (valve) insufficiency: Secondary | ICD-10-CM

## 2021-04-21 DIAGNOSIS — I428 Other cardiomyopathies: Secondary | ICD-10-CM | POA: Insufficient documentation

## 2021-04-21 DIAGNOSIS — I493 Ventricular premature depolarization: Secondary | ICD-10-CM | POA: Insufficient documentation

## 2021-04-21 DIAGNOSIS — Z7901 Long term (current) use of anticoagulants: Secondary | ICD-10-CM | POA: Insufficient documentation

## 2021-04-21 DIAGNOSIS — Z79899 Other long term (current) drug therapy: Secondary | ICD-10-CM | POA: Insufficient documentation

## 2021-04-21 DIAGNOSIS — Z8616 Personal history of COVID-19: Secondary | ICD-10-CM | POA: Insufficient documentation

## 2021-04-21 DIAGNOSIS — I083 Combined rheumatic disorders of mitral, aortic and tricuspid valves: Secondary | ICD-10-CM | POA: Insufficient documentation

## 2021-04-21 DIAGNOSIS — I480 Paroxysmal atrial fibrillation: Secondary | ICD-10-CM | POA: Insufficient documentation

## 2021-04-21 HISTORY — PX: TEE WITHOUT CARDIOVERSION: SHX5443

## 2021-04-21 LAB — ECHO TEE
MV M vel: 3.7 m/s
MV Peak grad: 54.8 mmHg
Radius: 0.3 cm

## 2021-04-21 SURGERY — ECHOCARDIOGRAM, TRANSESOPHAGEAL
Anesthesia: Monitor Anesthesia Care

## 2021-04-21 MED ORDER — PROPOFOL 10 MG/ML IV BOLUS
INTRAVENOUS | Status: DC | PRN
  Administered 2021-04-21: 30 mg via INTRAVENOUS
  Administered 2021-04-21 (×2): 20 mg via INTRAVENOUS
  Administered 2021-04-21: 30 mg via INTRAVENOUS

## 2021-04-21 MED ORDER — PHENYLEPHRINE HCL (PRESSORS) 10 MG/ML IV SOLN
INTRAVENOUS | Status: DC | PRN
  Administered 2021-04-21: 80 ug via INTRAVENOUS

## 2021-04-21 MED ORDER — SODIUM CHLORIDE 0.9 % IV SOLN: INTRAVENOUS | Status: DC

## 2021-04-21 MED ORDER — PROPOFOL 500 MG/50ML IV EMUL
INTRAVENOUS | Status: DC | PRN
  Administered 2021-04-21: 125 ug/kg/min via INTRAVENOUS

## 2021-04-21 MED ORDER — SODIUM CHLORIDE 0.9 % IV SOLN: INTRAVENOUS | Status: DC | PRN

## 2021-04-21 NOTE — Interval H&P Note (Signed)
History and Physical Interval Note:  04/21/2021 11:32 AM  Megan Keith  has presented today for surgery, with the diagnosis of [place].  The various methods of treatment have been discussed with the patient and family. After consideration of risks, benefits and other options for treatment, the patient has consented to  Procedure(s): TRANSESOPHAGEAL ECHOCARDIOGRAM (TEE) (N/A) as a surgical intervention.  The patient's history has been reviewed, patient examined, no change in status, stable for surgery.  I have reviewed the patient's chart and labs.  Questions were answered to the patient's satisfaction.     Dorris Carnes

## 2021-04-21 NOTE — Anesthesia Procedure Notes (Signed)
Procedure Name: MAC Date/Time: 04/21/2021 11:40 AM Performed by: Kathryne Hitch, CRNA Pre-anesthesia Checklist: Patient identified, Emergency Drugs available, Suction available and Patient being monitored Patient Re-evaluated:Patient Re-evaluated prior to induction Oxygen Delivery Method: Nasal cannula Preoxygenation: Pre-oxygenation with 100% oxygen Induction Type: IV induction Placement Confirmation: positive ETCO2 Dental Injury: Teeth and Oropharynx as per pre-operative assessment

## 2021-04-21 NOTE — Anesthesia Postprocedure Evaluation (Addendum)
Anesthesia Post Note  Patient: Megan Keith  Procedure(s) Performed: TRANSESOPHAGEAL ECHOCARDIOGRAM (TEE)     Patient location during evaluation: Endoscopy Anesthesia Type: MAC Level of consciousness: patient cooperative, sedated and oriented Pain management: pain level controlled Vital Signs Assessment: post-procedure vital signs reviewed and stable Respiratory status: nonlabored ventilation, spontaneous breathing and respiratory function stable Cardiovascular status: blood pressure returned to baseline and stable Postop Assessment: no apparent nausea or vomiting Anesthetic complications: no   No notable events documented.  Last Vitals:  Vitals:   04/21/21 1230 04/21/21 1240  BP: (!) 82/61 92/62  Pulse: 77 73  Resp: (!) 22 (!) 22  Temp:    SpO2: 100% 100%    Last Pain:  Vitals:   04/21/21 1240  TempSrc:   PainSc: 0-No pain                 Oakes Mccready,E. Kirke Breach

## 2021-04-21 NOTE — Progress Notes (Signed)
  Echocardiogram Echocardiogram Transesophageal has been performed.  Megan Keith 04/21/2021, 2:06 PM

## 2021-04-21 NOTE — CV Procedure (Signed)
Transesophageal Echo   Patient sedated by anesthesia with Propofol intravenously  Mouth guard placed TEE probed advanced through mouth to mid esophagus without difficulty  Full report to follow in CV section of chart  Procedure was without complications   Dorris Carnes MD

## 2021-04-21 NOTE — Transfer of Care (Signed)
Immediate Anesthesia Transfer of Care Note  Patient: Megan Keith  Procedure(s) Performed: TRANSESOPHAGEAL ECHOCARDIOGRAM (TEE)  Patient Location: Endoscopy Unit  Anesthesia Type:MAC  Level of Consciousness: drowsy and patient cooperative  Airway & Oxygen Therapy: Patient Spontanous Breathing and Patient connected to nasal cannula oxygen  Post-op Assessment: Report given to RN and Post -op Vital signs reviewed and stable  Post vital signs: Reviewed and stable  Last Vitals:  Vitals Value Taken Time  BP 98/69 04/21/21 1226  Temp    Pulse 84 04/21/21 1228  Resp 18 04/21/21 1228  SpO2 98 % 04/21/21 1228  Vitals shown include unvalidated device data.  Last Pain:  Vitals:   04/21/21 1036  TempSrc: Temporal  PainSc: 0-No pain         Complications: No notable events documented.

## 2021-04-21 NOTE — Discharge Instructions (Addendum)

## 2021-04-21 NOTE — Anesthesia Preprocedure Evaluation (Signed)
Anesthesia Evaluation  Patient identified by MRN, date of birth, ID band Patient awake    Reviewed: Allergy & Precautions, NPO status , Patient's Chart, lab work & pertinent test results  History of Anesthesia Complications Negative for: history of anesthetic complications  Airway Mallampati: II  TM Distance: >3 FB Neck ROM: Full    Dental  (+) Dental Advisory Given   Pulmonary neg pulmonary ROS,    breath sounds clear to auscultation       Cardiovascular hypertension, Pt. on medications and Pt. on home beta blockers (-) angina+CHF  + dysrhythmias Atrial Fibrillation  Rhythm:Regular Rate:Normal  03/12/2021 R heart cath: Markedly elevated filling pressures with preserved cardiac output. Prominent v-waves in the PCWP tracing suggestive of significant MR vs diastolic dysfunction  0/27/2536 ECHO: 20 to 25%. The left ventricle has severely decreased function, global hypokinesis. Suspect grade III diastolic function with restrictive filling, however, no distinct A-wave is visualized. Right ventricular systolic function is mildly reduced, severely elevated pulmonary artery systolic pressure   Neuro/Psych  Headaches,    GI/Hepatic Neg liver ROS, GERD  Medicated and Controlled,  Endo/Other  negative endocrine ROS  Renal/GU negative Renal ROS     Musculoskeletal  (+) Arthritis ,   Abdominal (+) + obese,   Peds  Hematology eliquis   Anesthesia Other Findings   Reproductive/Obstetrics                             Anesthesia Physical Anesthesia Plan  ASA: 3  Anesthesia Plan: MAC   Post-op Pain Management:    Induction:   PONV Risk Score and Plan: 2 and Ondansetron and Treatment may vary due to age or medical condition  Airway Management Planned: Natural Airway and Nasal Cannula  Additional Equipment: None  Intra-op Plan:   Post-operative Plan:   Informed Consent: I have reviewed the  patients History and Physical, chart, labs and discussed the procedure including the risks, benefits and alternatives for the proposed anesthesia with the patient or authorized representative who has indicated his/her understanding and acceptance.     Dental advisory given  Plan Discussed with: CRNA and Surgeon  Anesthesia Plan Comments:         Anesthesia Quick Evaluation

## 2021-04-21 NOTE — Progress Notes (Deleted)
  Echocardiogram 2D Echocardiogram has been performed.  Megan Keith 04/21/2021, 1:17 PM

## 2021-04-23 ENCOUNTER — Encounter (HOSPITAL_COMMUNITY): Payer: Self-pay | Admitting: Internal Medicine

## 2021-04-23 NOTE — Progress Notes (Signed)
[facility]  [address] [place],  [place]  [address] [phone]  Clinic Day:  05/01/2021  Referring physician: [facility]  This document serves as a record of services personally performed by Marice Potter, MD. It was created on their behalf by Curry,Lauren E, a trained medical scribe. The creation of this record is based on the scribe's personal observations and the provider's statements to them.  HISTORY OF PRESENT ILLNESS:  The patient is a 60 y.o. 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  She also completed adjuvant breast radiation to prevent local disease recurrence.  Currently, the patient is taking anastrozole for her adjuvant hormonal management.  She comes in today routine follow-up.  Since her last visit, the patient has been doing okay.  She denies having any particular changes in her breasts which concern her for disease recurrence.   Of note, her annual mammogram in March 2022 showed no evidence of disease recurrence.  PHYSICAL EXAM:  Blood pressure 128/89, pulse (!) 106, temperature 98.7 F (37.1 C), resp. rate 14, height 5' (1.524 m), weight 143 lb 11.2 oz (65.2 kg), SpO2 99 %. Wt Readings from Last 3 Encounters:  05/01/21 143 lb 11.2 oz (65.2 kg)  04/21/21 144 lb (65.3 kg)  04/01/21 149 lb 11.5 oz (67.9 kg)   Body mass index is 28.06 kg/m. Performance status (ECOG): 1 - Symptomatic but completely ambulatory Physical Exam Constitutional:      Appearance: Normal appearance.  HENT:     Mouth/Throat:     Pharynx: Oropharynx is clear. No oropharyngeal exudate.  Cardiovascular:     Rate and Rhythm: Normal rate and regular rhythm.     Heart sounds: No murmur heard.   No friction rub. No gallop.  Pulmonary:     Breath sounds: Normal breath sounds.  Chest:  Breasts:    Right: No swelling, bleeding, inverted nipple, mass, nipple discharge or skin  change.     Left: No swelling, bleeding, inverted nipple, mass, nipple discharge or skin change.  Abdominal:     General: Bowel sounds are normal. There is no distension.     Palpations: Abdomen is soft. There is no mass.     Tenderness: There is no abdominal tenderness.  Musculoskeletal:        General: No tenderness.     Cervical back: Normal range of motion and neck supple.     Right lower leg: No edema.     Left lower leg: No edema.  Lymphadenopathy:     Cervical: No cervical adenopathy.     Right cervical: No superficial, deep or posterior cervical adenopathy.    Left cervical: No superficial, deep or posterior cervical adenopathy.     Upper Body:     Right upper body: No supraclavicular or axillary adenopathy.     Left upper body: No supraclavicular or axillary adenopathy.     Lower Body: No right inguinal adenopathy. No left inguinal adenopathy.  Skin:    Coloration: Skin is not jaundiced.     Findings: No lesion or rash.  Neurological:     General: No focal deficit present.     Mental Status: She is alert and oriented to person, place, and time. Mental status is at baseline.  Psychiatric:        Mood and Affect: Mood normal.        Behavior: Behavior normal.  Thought Content: Thought content normal.        Judgment: Judgment normal.    ASSESSMENT & PLAN:  Assessment/Plan:  A 60 y.o. 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  Based upon her clinical breast exam today and recent mammogram, the patient remains disease-free.  She knows to continue taking her anastrozole on a daily basis to complete 5 total years of adjuvant endocrine therapy.  Otherwise, as she is doing well, I will her back in 6 months for a repeat clinical breast exam.  Her annual mammogram will be scheduled before her next visit for her continued radiographic breast cancer surveillance.  The patient understands all the plans discussed today  and is in agreement with them.     I, LRita Ohara am acting as scribe for DMarice Potter MD    I have reviewed this report as typed by the medical scribe, and it is complete and accurate.  Latrail Pounders AMacarthur Critchley MD

## 2021-05-01 ENCOUNTER — Other Ambulatory Visit: Payer: Self-pay | Admitting: Oncology

## 2021-05-01 ENCOUNTER — Telehealth: Payer: Self-pay | Admitting: Oncology

## 2021-05-01 ENCOUNTER — Inpatient Hospital Stay: Payer: Self-pay | Attending: Oncology | Admitting: Oncology

## 2021-05-01 ENCOUNTER — Other Ambulatory Visit: Payer: Self-pay

## 2021-05-01 VITALS — BP 128/89 | HR 106 | Temp 98.7°F | Resp 14 | Ht 60.0 in | Wt 143.7 lb

## 2021-05-01 DIAGNOSIS — Z170 Estrogen receptor positive status [ER+]: Secondary | ICD-10-CM

## 2021-05-01 DIAGNOSIS — C50512 Malignant neoplasm of lower-outer quadrant of left female breast: Secondary | ICD-10-CM

## 2021-05-01 DIAGNOSIS — C50412 Malignant neoplasm of upper-outer quadrant of left female breast: Secondary | ICD-10-CM

## 2021-05-01 NOTE — Telephone Encounter (Signed)
Per 9/9 LOS next appt scheduled and given to patient

## 2021-05-02 ENCOUNTER — Encounter: Payer: Self-pay | Admitting: Oncology

## 2021-05-04 ENCOUNTER — Other Ambulatory Visit: Payer: Self-pay

## 2021-05-04 ENCOUNTER — Ambulatory Visit (HOSPITAL_COMMUNITY)
Admission: RE | Admit: 2021-05-04 | Discharge: 2021-05-04 | Disposition: A | Discharge status: Home / Self Care | Payer: Self-pay | Source: Ambulatory Visit | Attending: Internal Medicine | Admitting: Internal Medicine

## 2021-05-04 ENCOUNTER — Encounter (HOSPITAL_COMMUNITY): Payer: Self-pay | Admitting: Internal Medicine

## 2021-05-04 VITALS — BP 110/80 | HR 92 | Wt 144.0 lb

## 2021-05-04 DIAGNOSIS — Z7901 Long term (current) use of anticoagulants: Secondary | ICD-10-CM | POA: Insufficient documentation

## 2021-05-04 DIAGNOSIS — I493 Ventricular premature depolarization: Secondary | ICD-10-CM | POA: Insufficient documentation

## 2021-05-04 DIAGNOSIS — I480 Paroxysmal atrial fibrillation: Secondary | ICD-10-CM | POA: Insufficient documentation

## 2021-05-04 DIAGNOSIS — Z8249 Family history of ischemic heart disease and other diseases of the circulatory system: Secondary | ICD-10-CM | POA: Insufficient documentation

## 2021-05-04 DIAGNOSIS — Z923 Personal history of irradiation: Secondary | ICD-10-CM | POA: Insufficient documentation

## 2021-05-04 DIAGNOSIS — Z853 Personal history of malignant neoplasm of breast: Secondary | ICD-10-CM | POA: Insufficient documentation

## 2021-05-04 DIAGNOSIS — I340 Nonrheumatic mitral (valve) insufficiency: Secondary | ICD-10-CM

## 2021-05-04 DIAGNOSIS — I5022 Chronic systolic (congestive) heart failure: Secondary | ICD-10-CM | POA: Insufficient documentation

## 2021-05-04 DIAGNOSIS — Z01810 Encounter for preprocedural cardiovascular examination: Secondary | ICD-10-CM

## 2021-05-04 DIAGNOSIS — C539 Malignant neoplasm of cervix uteri, unspecified: Secondary | ICD-10-CM | POA: Insufficient documentation

## 2021-05-04 DIAGNOSIS — T462X5D Adverse effect of other antidysrhythmic drugs, subsequent encounter: Secondary | ICD-10-CM | POA: Insufficient documentation

## 2021-05-04 DIAGNOSIS — Z8616 Personal history of COVID-19: Secondary | ICD-10-CM | POA: Insufficient documentation

## 2021-05-04 DIAGNOSIS — Z79899 Other long term (current) drug therapy: Secondary | ICD-10-CM | POA: Insufficient documentation

## 2021-05-04 DIAGNOSIS — E0580 Other thyrotoxicosis without thyrotoxic crisis or storm: Secondary | ICD-10-CM | POA: Insufficient documentation

## 2021-05-04 NOTE — Progress Notes (Signed)
Advanced Heart Failure Clinic Note   PCP: Pcp, No HF Cardiologist: Glori Bickers, MD  EP: Dr. Rayann Heman   Reason for Visit:f/u for Chronic Systolic Heart Failure   HPI: Megan Keith is a 60 y.o.female with chronic systolic CHF (Echo EF 0000000 in 03/2017)  PAF, and hyperthyroidisum.     Admitted to [facility] 8/18 with ADHF. Prior EF with Dr. [name] was 35%. Repeat echo EF 15%. Transferred to [facility]. R/LHC. no CAD. Fick output/index 5.3/3.3. Noted to have >20% PVC on telemetry. Started on amiodarone which she susequently stopped.   Echo 11/19 25-30%   Admitted 7/20 with ADHF in setting of new-onset AF, in the setting of hyperthyroidism. TEE with EF 10% RV ok. Started on amiodarone and diuresed. Underwent TEE/DC-CV and felt much better. Also treated with methimazole and prednisone.   Zio patch placed to monitor PVC burden.    Zio patch 8/20 - NSR avg HR 75 - 13 runs NSVT longest 14 beats - 59 runs SVT longest 9.4 seconds - PVC burden 4.8%   Admitted 10/20 for COVID 19 infection c/b recurrent afib w/ RVR. Was loaded on IV amiodarone and rate improved but did not convert back to NSR. Was placed on Eliquis and discharged home on PO digoxin and metoprolol.   Admitted from HF clinic 11/20 with A Fib RVR -> ADHF.Methimazole and prednisone continued for hyperthyroidism.  EP was consulted. Initial plans were for PVI ablation but procedure canceled after TEE showed severely reduced EF at 5% and severe LAE. Had DC-CV with conversion to NSR.  She was also treated w/ Losartan (BP too soft for Entesto), spironolactone and digoxin.   Saw Dr. Rayann Heman on 10/22/19 and was still in NSR. Discussed possible AF ablation. There was concern over degree of MR and LAE.   She finished treatment for hyperthyroidism with Dr. [name] 5/21.    Admitted [name] 9/21 She was reportedly treated for mild hemoptysis in setting of PNA and CHF. Treated w/ abx and lasix. Had low BP and was taken off losartan  and Coreg.    11/21 High PVC burden so Mexitil 200 mg bid started.   Repeat Echo (4/22) EF 20-25%   Zio (5/22) (on mexiletine). Occasional PVCs, burden low at 3.3%. Also had 32 runs of SVT, the longest lasting 10 beats w/ average HR 104 bpm.   Diagnosed w/ cervical cancer 7/22.  Underwent RHC 03/12/21 after having increased fatigue/decreased exercise tolerance; concern for low output. Found to have  markedly elevated filling pressures and preserved CO (see below). She was given metolazone 2.5 x 2 days and lasix increased to 60 mg daily.  Today she returns for HF follow up. She is here with her granddaughter and interpreter. Says she feels ok from a heart standpoint. Able to do ADLs but gets SOB if she does more. Has good days/bad days. No edema, orthopnea or PND. Weight down a few pounds at home 148 -> 144. Scheduled for hysterectomy on Friday at [facility]. Also c/o of tooth pain and pending eventual extraction. Currently on Flagyl.   Cardiac Studies   Echo (8/18): EF 10-15% Echo (12/18): EF 25-30%  Echo (4/19): EF 25-30% Echo (11/19): EF ~30% (per Dr Haroldine Laws) Echo (8/20): EF 25-30%  TEE (11/20): EF 5-10% Echo (2/21): EF 30-35% moderate MR Echo (5/21): EF 25-30%. RV normal.  Echo (11/21): EF 20-25% RV normal.  - RHC (7/22): Elevated filling pressures w/ preserved CO; prominent v-waves suggesting significant MR vs. diastolic dysfunction.   RA = 3  RV = 57/5 PA = 58/28 (43) PCW = 30 (v = 45) Fick cardiac output/index = 4.6/2.8 PVR = 2.8 WU FA sat = 95% PA sat = 67%, 69% SVC = 74%   Assessment: Markedly elevated filling pressures with preserved cardiac output. Prominent v-waves in the PCWP tracing suggestive of significant MR vs diastolic dysfunction   R/LHC (8/18): normal cors  Right Heart RA (mean): 5 mmHg RV (S/EDP): 52/4 mmHg PA (S/D, mean): 52/20 (31) mmHg PCWP (mean): 30 mmHg Ao sat: 96% PA sat: 71% RA sat: 74% Fick CO: 5.3 L/min Fick CI: 3.3 L/min/m^2 SVR: 1,101  dynscm-5  - Zio patch (8/20): - NSR avg HR 75 - 13 runs NSVT longest 14 beats - 59 runs SVT longest 9.4 seconds - PVC burden 4.8%  - Zio Patch (5/22): 1. Sinus rhythm - avg HR of 79 bpm. 2. 32 runs of SVT - the longest lasting 10 beats with an avg rate of 104 bpm 3. 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 ? PVC cardiomyopathy. right and left heart catheterization on 04/15/17, no CAD. Fick output/index 5.28/3.34.   Noncompliance    a. episodic noncompliance.   Pneumonia 06/2016   Current Outpatient Medications  Medication Sig Dispense Refill   acetaminophen (TYLENOL) 325 MG tablet Take 325-650 mg by mouth every 6 (six) hours as needed (for pain.).     amiodarone (PACERONE) 200 MG tablet Take 100 mg by mouth daily.     anastrozole (ARIMIDEX) 1 MG tablet Take 1 tablet (1 mg total) by mouth in the morning. Patient takes it at night. 90 tablet 0   apixaban (ELIQUIS) 5 MG TABS tablet Take 1 tablet (5 mg total) by mouth 2 (two) times daily. 60 tablet 6   carvedilol (COREG) 3.125 MG tablet Take 1 tablet (3.125 mg total) by mouth 2 (two) times daily. 60 tablet 6   furosemide (LASIX) 40 MG tablet Take 40 mg by mouth daily.     metroNIDAZOLE (FLAGYL) 500 MG tablet Take 500 mg by mouth 2 (two) times daily. X 7 days     mexiletine (MEXITIL) 200 MG capsule Take 1 capsule (200 mg total) by mouth 2 (two) times daily. 60 capsule 6   omeprazole (PRILOSEC) 20 MG capsule Take 20 mg by mouth in the morning.      spironolactone (ALDACTONE) 25 MG tablet Take 1 tablet (25 mg total) by mouth daily. 30 tablet 6   No current facility-administered medications for this encounter.   No Known Allergies  Social History   Socioeconomic History   Marital status: Single    Spouse name: Not on file   Number of children: Not on file   Years of education: Not on file   Highest education level: Not on file  Occupational History   Not on file  Tobacco Use   Smoking status: Never   Smokeless tobacco: Never  Vaping Use  Vaping Use: Never used  Substance and Sexual Activity   Alcohol use: Not Currently    Comment: 04/15/2017 "might have a couple beers q couple months"   Drug use: Not Currently   Sexual activity: Not Currently  Other Topics Concern   Not on file  Social History Narrative   ** Merged History Encounter **       N/A   Social Determinants of Health   Financial Resource Strain: Not on file  Food Insecurity: Not on file  Transportation Needs: Not on file  Physical Activity: Not on file  Stress: Not on file  Social Connections: Not on file  Intimate Partner Violence: Not on file   Family History  Problem Relation Age of Onset   Hypertension Mother    BP 110/80   Pulse 92   Wt 65.3 kg (144 lb)   SpO2 98%   BMI 28.12 kg/m   Wt Readings from Last 3 Encounters:  05/04/21 65.3 kg (144 lb)  05/01/21 65.2 kg (143 lb 11.2 oz)  04/21/21 65.3 kg (144 lb)   PHYSICAL EXAM: General:  Well appearing. No resp difficulty HEENT: normal Neck: supple. CVP 7  Carotids 2+ bilat; no bruits. No lymphadenopathy or thryomegaly appreciated. Cor: PMI nondisplaced. Regular rate & rhythm. No rubs, gallops or murmurs. Lungs: clear Abdomen: soft, nontender, nondistended. No hepatosplenomegaly. No bruits or masses. Good bowel sounds. Extremities: no cyanosis, clubbing, rash, edema Neuro: alert & orientedx3, cranial nerves grossly intact. moves all 4 extremities w/o difficulty. Affect  pleasant   ReDs: 40%  ASSESSMENT & PLAN:  1. Chronic systolic CHF: NICM, normal cors in 03/2017. 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  - Echo (03/2017):  EF 15-20%  - Echo (07/28/17): EF 25-30%  - Echo (06/2018): EF ~30% - TEE (11/20): EF 5% in setting of AF - Echo (2/21): EF 30-35% - Echo (5/21): EF 25-30%. RV ok  - Echo (07/08/20): EF 20-25% RV ok - Echo (4/22): 20-25%  - RHC (7/22): markedly elevated filling pressures and preserved CO, prominent v-waves - Stable NYHA III, Volume mildly elevated, ReDs Clip 34% -> 40% - On lasix 40 bid. Will double to 80 bid for 2 days then back to 40 bid,  - Continue Coreg 3.125 mg bid. - Continue spiro 25 mg daily.  - Intolerant to [place].  - BP has been too low for ARB/ARNI. Will not rechallenge prior to upcoming surgery  - Have not proceeded with ICD given non-compliance and hope that EF would improve with rhythm management.  - Recent labs from [facility] reviewed personally and ok   2. PAF  - S/p previous DC-CV in 7/20. 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   - Previously was on amio for PVCs but developed hyperthyroidism, now being treated w/ methimazole.  - Admitted 06/2019 for persistent afib w/ RVR + a/c systolic CHF. Loaded w/ IV amiodarone and evaluated by EP. Initial plans were for PVI ablation but procedure canceled after TEE showed severely reduced EF at 5% and severe LAE. Pt underwent DCCV instead.   - Saw Dr. Rayann Heman again in 2/21. Planned for AF ablation but does not have insurance coverage for AF ablation. Continue medical treatment. - Remains in NSR today - Continue amio 100 mg daily. - Continue Eliquis 5 mg bid. - recent amio labs ok.   3. MR - Prominent v-waves in PCWP tracing on RHC 7/22 - No appreciable mumur noted on exam. - Consider TEE to evaluate severity of MR vs diastolic dysfunction.  4. Hyperthyroidism  due to Amio  - Recent TFTs 6/22: TSH normal 0.53,  Free T3 T4 ok  - Completed treatment.  - Per Dr. Jasmine December, no further  intervention needed.   5. H/o Frequent PVCs - Zio patch (8/20) with 5% PVC burden; Zio patch (3 days)  -11/21 with 16% PVC burden. - Zio (5/22) w/ only occasional PVCs, burden 3.3% on amio + mexilitine. - No PVCs on ECG today.  - Continue amio and mexilitene  6. Left Breast Cancer. - Treated w/ XRT.  7. Adenocarcinoma in situ of cervix;  - Followed by [facility]. - Pending total hysteractomy on Friday 05/07/2021, planning robotic laparoscopic transvaginally under spinal anesthesia. I feel that she is at moderate to high risk for peri-operative CV complications due to her low EF but given her preserved functional capacity I think the risk is acceptable and surgery should proceed given the need for treatment of her cervical cancer. I discussed with Dr. Denman George and confirmed that the procedure would be done as an inpatient with close cardiology f/u as needed post-operatively to manage fluid shifts/overload and possible recurrent AF.  - Ok to stop Eliquis for surgery  Total time spent 35 minutes. Over half that time spent discussing above.     Glori Bickers, MD 05/04/21

## 2021-05-04 NOTE — Progress Notes (Signed)
ReDS Vest / Clip - 05/04/21 1600       ReDS Vest / Clip   Station Marker A    Ruler Value 30    ReDS Value Range Moderate volume overload    ReDS Actual Value 40

## 2021-05-04 NOTE — Patient Instructions (Addendum)
Electrocardiograma hecho hoy.  No se realizaron laboratorios hoy.  aumentar lasix a 80 mg 2 comprimidos por va oral al da durante 2 das.luego disminuya nuevamente a 40 mg (1 tableta) por va oral al da.  No se realizaron Financial trader. Contine con todos los medicamentos actuales segn lo prescrito.  Su mdico le recomienda programar una cita de seguimiento en: 3 meses.  Si tiene [place] pregunta o inquietud antes de su prxima cita, envenos un mensaje a travs de mychart o llame a nuestra oficina al [phone].  PARA DEJAR UN MENSAJE PARA LA ENFERMERA SELECCIONE LA OPCIN 2, POR FAVOR DEJE UN MENSAJE QUE INCLUYA:  SU NOMBRE  FECHA DE NACIMIENTO  NMERO DE DEVOLUCIN DE LLAMADA  MOTIVO DE LA LLAMADA**esto es importante ya que damos prioridad a las devoluciones de llamadas  RECIBIR UNA LLAMADA EL MISMO DA SIEMPRE QUE LLAME ANTES DE LAS 4:00 PM   Haz lo siguiente TODOS LOS DAS: 1) Psate por la maana antes del desayuno. Antelo y gurdelo en un registro. 2) Tome sus medicamentos segn lo prescrito 3) Coma alimentos bajos en sal: limite la sal (sodio) a 2000 mg por da. 4) Mantngase tan activo como pueda [employer] 5) Limite todos los lquidos del da a menos de 2 litros   En la [place] de [place], usted y sus necesidades de salud son Cleotis Nipper prioridad. Como parte de nuestra misin continua de brindarle una atencin cardaca excepcional, hemos creado equipos de atencin de proveedores designados. Estos equipos de atencin incluyen a su cardilogo primario (mdico) y proveedores de Financial planner (APP, asistentes mdicos y enfermeras practicantes) que trabajan juntos para brindarle la atencin que necesita, cuando la necesita.  Puede ver a cualquiera de los siguientes proveedores en su equipo de atencin designado en su prximo seguimiento:  Dr. Quillian Quince Bensimhon  Dr. Loralie Champagne  Amy Ninfa Meeker, NP  Imagene Gurney, [place]   Audry Riles, Doctora en [place] de traer todas las botellas de sus medicamentos a cada cita.

## 2021-05-08 ENCOUNTER — Telehealth (HOSPITAL_COMMUNITY): Payer: Self-pay | Admitting: *Deleted

## 2021-05-08 NOTE — Telephone Encounter (Signed)
Dr.Antonio with [facility] requests call back from [place] regarding pts procedure and post op care.  Call back # [phone]  Routed to [place]

## 2021-05-18 NOTE — Progress Notes (Signed)
Advanced Heart Failure Clinic Note   PCP: Pcp, No HF Cardiologist: Glori Bickers, MD  EP: Dr. Rayann Heman   Reason for Visit:f/u for Chronic Systolic Heart Failure   HPI: Megan Keith is a 60 y.o.female with chronic systolic CHF (Echo EF 59-56% in 03/2017)  PAF, and hyperthyroidisum.     [facility] with ADHF. Prior EF with Dr. [name] was 35%. Repeat echo EF 15%. Transferred to [facility]. R/LHC. no CAD. Fick output/index 5.3/3.3. Noted to have >20% PVC on telemetry. Started on amiodarone which she susequently stopped.   Echo 11/19 25-30%   Admitted 7/20 with ADHF in setting of new-onset AF, in the setting of hyperthyroidism. TEE with EF 10% RV ok. Started on amiodarone and diuresed. Underwent TEE/DC-CV and felt much better. Also treated with methimazole and prednisone.   Zio patch placed to monitor PVC burden.    Zio patch 8/20 - NSR avg HR 75 - 13 runs NSVT longest 14 beats - 59 runs SVT longest 9.4 seconds - PVC burden 4.8%   Admitted 10/20 for COVID 19 infection c/b recurrent afib w/ RVR. Was loaded on IV amiodarone and rate improved but did not convert back to NSR. Was placed on Eliquis and discharged home on PO digoxin and metoprolol.   Admitted from HF clinic 11/20 with A Fib RVR -> ADHF.Methimazole and prednisone continued for hyperthyroidism.  EP was consulted. Initial plans were for PVI ablation but procedure canceled after TEE showed severely reduced EF at 5% and severe LAE. Had DC-CV with conversion to NSR.  She was also treated w/ Losartan (BP too soft for Entesto), spironolactone and digoxin.   Saw Dr. Rayann Heman on 10/22/19 and was still in NSR. Discussed possible AF ablation. There was concern over degree of MR and LAE.   She finished treatment for hyperthyroidism with Dr. [name] 5/21.    Admitted [name] 9/21 She was reportedly treated for mild hemoptysis in setting of PNA and CHF. Treated w/ abx and lasix. Had low BP and was taken off losartan  and Coreg.    11/21 High PVC burden so Mexitil 200 mg bid started.   Repeat Echo (4/22) EF 20-25%   Zio (5/22) (on mexiletine). Occasional PVCs, burden low at 3.3%. Also had 32 runs of SVT, the longest lasting 10 beats w/ average HR 104 bpm.   Diagnosed w/ cervical cancer 7/22.  Underwent RHC 03/12/21 after having increased fatigue/decreased exercise tolerance; concern for low output. Found to have  markedly elevated filling pressures and preserved CO (see below). She was given metolazone 2.5 x 2 days and lasix increased to 60 mg daily.  S/p total hysertectomy 05/08/21, tolerated well. Lasix was held with low BP with instructions to restart when sBP>100.  Today she returns for HF follow up here with her granddaughter and interpretor. Surgery went well, pain is manageable with Tylenol. Abdomen feels bloated. Took lasix for 2 days after surgery then stopped because she was urinating a lot. Has some dizziness. Able to get around the house OK without SOB, gets more dyspneic with increased activity. Denies CP, edema, or PND/Orthopnea. Appetite ok. No fever or chills. Weight at home 138 pounds. Has been drinking >2L/fluid a day.  Cardiac Studies   Echo (8/18): EF 10-15% Echo (12/18): EF 25-30%  Echo (4/19): EF 25-30% Echo (11/19): EF ~30% (per Dr Haroldine Laws) Echo (8/20): EF 25-30%  TEE (11/20): EF 5-10% Echo (2/21): EF 30-35% moderate MR Echo (5/21): EF 25-30%. RV normal.  Echo (11/21): EF 20-25%  RV normal.  - RHC (7/22): Elevated filling pressures w/ preserved CO; prominent v-waves suggesting significant MR vs. diastolic dysfunction.   RA = 3 RV = 57/5 PA = 58/28 (43) PCW = 30 (v = 45) Fick cardiac output/index = 4.6/2.8 PVR = 2.8 WU FA sat = 95% PA sat = 67%, 69% SVC = 74%   Assessment: Markedly elevated filling pressures with preserved cardiac output. Prominent v-waves in the PCWP tracing suggestive of significant MR vs diastolic dysfunction   R/LHC (8/18): normal cors  Right  Heart RA (mean): 5 mmHg RV (S/EDP): 52/4 mmHg PA (S/D, mean): 52/20 (31) mmHg PCWP (mean): 30 mmHg Ao sat: 96% PA sat: 71% RA sat: 74% Fick CO: 5.3 L/min Fick CI: 3.3 L/min/m^2 SVR: 1,101 dynscm-5  - Zio patch (8/20): - NSR avg HR 75 - 13 runs NSVT longest 14 beats - 59 runs SVT longest 9.4 seconds - PVC burden 4.8%  - Zio Patch (5/22): 1. Sinus rhythm - avg HR of 79 bpm. 2. 32 runs of SVT - the longest lasting 10 beats with an avg rate of 104 bpm 3. PVCs  were occasional (3.3%, 30573)  Past Medical History:  Diagnosis Date   A-fib ([place])    Abnormal TSH    Anemia    Arthritis    "knees, hands" (04/15/2017)   Breast cancer (HCC)    CHF (congestive heart failure) (HCC)    Chronic knee pain    Chronic systolic CHF (congestive heart failure) ([place])    COVID-19    Does not have health insurance    Frequent PVCs    Hyperthyroidism 03/05/2019   Hyperthyroidism 03/05/2019   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 ([place]) 04/15/2017   NICM (nonischemic cardiomyopathy) ([place])    a. ? PVC cardiomyopathy. right and left heart catheterization on 04/15/17, no CAD. Fick output/index 5.28/3.34.   Noncompliance    a. episodic noncompliance.   Pneumonia 06/2016   Current Outpatient Medications  Medication Sig Dispense Refill   acetaminophen (TYLENOL) 325 MG tablet Take 325-650 mg by mouth every 6 (six) hours as needed (for pain.).     amiodarone (PACERONE) 200 MG tablet Take 100 mg by mouth daily.     anastrozole (ARIMIDEX) 1 MG tablet Take 1 tablet (1 mg total) by mouth in the morning. Patient takes it at night. 90 tablet 0   apixaban (ELIQUIS) 5 MG TABS tablet Take 1 tablet (5 mg total) by mouth 2 (two) times daily. 60 tablet 6   carvedilol (COREG) 3.125 MG tablet Take 1 tablet (3.125 mg total) by mouth 2 (two) times daily. 60 tablet 6   furosemide (LASIX) 40 MG tablet Take 40 mg by mouth daily.     mexiletine (MEXITIL) 200 MG capsule Take 1 capsule (200 mg total) by mouth 2 (two) times  daily. 60 capsule 6   omeprazole (PRILOSEC) 20 MG capsule Take 20 mg by mouth in the morning.     spironolactone (ALDACTONE) 25 MG tablet Take 1 tablet (25 mg total) by mouth daily. 30 tablet 6   No current facility-administered medications for this encounter.   No Known Allergies  Social History   Socioeconomic History   Marital status: Single    Spouse name: Not on file   Number of children: Not on file   Years of education: Not on file   Highest education level: Not on file  Occupational History   Not on file  Tobacco Use   Smoking status: Never   Smokeless tobacco:  Never  Vaping Use   Vaping Use: Never used  Substance and Sexual Activity   Alcohol use: Not Currently    Comment: 04/15/2017 "might have a couple beers q couple months"   Drug use: Not Currently   Sexual activity: Not Currently  Other Topics Concern   Not on file  Social History Narrative   ** Merged History Encounter **       N/A   Social Determinants of Health   Financial Resource Strain: Not on file  Food Insecurity: Not on file  Transportation Needs: Not on file  Physical Activity: Not on file  Stress: Not on file  Social Connections: Not on file  Intimate Partner Violence: Not on file   Family History  Problem Relation Age of Onset   Hypertension Mother    BP 102/60   Pulse 88   Wt 66.1 kg (145 lb 12.8 oz)   SpO2 100%   BMI 28.47 kg/m   Wt Readings from Last 3 Encounters:  05/19/21 66.1 kg (145 lb 12.8 oz)  05/04/21 65.3 kg (144 lb)  05/01/21 65.2 kg (143 lb 11.2 oz)   PHYSICAL EXAM: General:  NAD. No resp difficulty HEENT: Normal Neck: Supple. No JVD. Carotids 2+ bilat; no bruits. No lymphadenopathy or thryomegaly appreciated. Cor: PMI nondisplaced. Regular rate & rhythm. No rubs, gallops or murmurs. Lungs: Clear Abdomen: Soft, nontender, nondistended. No hepatosplenomegaly. No bruits or masses. Good bowel sounds. Extremities: No cyanosis, clubbing, rash, edema Neuro: Alert &  oriented x 3, cranial nerves grossly intact. Moves all 4 extremities w/o difficulty. Affect pleasant.  ReDs: 34%  ECG: SR with PVC (personally reviewed).  ASSESSMENT & PLAN:  1. Chronic systolic CHF: NICM, normal cors in 03/2017. 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  - Echo (03/2017):  EF 15-20%  - Echo (07/28/17): EF 25-30%  - Echo (06/2018): EF ~30% - TEE (11/20): EF 5% in setting of AF - Echo (2/21): EF 30-35% - Echo (5/21): EF 25-30%. RV ok  - Echo (07/08/20): EF 20-25% RV ok - Echo (4/22): 20-25%  - RHC (7/22): markedly elevated filling pressures and preserved CO, prominent v-waves - Stable NYHA III, Volume good today, ReDs Clip 34%. - Restart lasix 40 mg daily. - Continue Coreg 3.125 mg bid. Will give Rx for BP cuff. Hold carvedilol if SBP<100. - Continue spiro 25 mg daily.  - Intolerant to [place].  - BP has been too low for ARB/ARNI.  - Have not proceeded with ICD given non-compliance and hope that EF would improve with rhythm management.  - Labs from [facility] 05/09/21 reviewed personally and ok  - We discussed limiting fluids to less than 2 L a day and ensuring compliance with medications.  2. PAF  - S/p previous DC-CV in 7/20. 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   - Previously was on amio for PVCs but developed hyperthyroidism, now being treated w/ methimazole.  - Admitted 06/2019 for persistent afib w/ RVR + a/c systolic CHF. Loaded w/ IV amiodarone and evaluated by EP. Initial plans were for PVI ablation but procedure canceled after TEE showed severely reduced EF at 5% and severe LAE. Pt underwent DCCV instead.   - Saw Dr. Rayann Heman again in 2/21. Planned for AF ablation but does not have insurance coverage for AF ablation. Continue medical treatment. - Remains in NSR today on ECG. - Continue amio 100 mg daily. - Continue Eliquis 5 mg bid. No bleeding issues. - Recent amio labs ok.   3. MR - Prominent v-waves in  PCWP tracing on RHC 7/22 - No appreciable mumur noted on exam. -  Consider TEE to evaluate severity of MR vs diastolic dysfunction.  4. 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  - Per Dr. Jasmine December, no further intervention needed.   5. H/o Frequent PVCs - Zio patch (8/20) with 5% PVC burden; Zio patch (3 days)  -11/21 with 16% PVC burden. - Zio (5/22) w/ only occasional PVCs, burden 3.3% on amio + mexilitine. - Continue amio and mexilitene  6. Left Breast Cancer - Treated w/ XRT.  7. Adenocarcinoma in situ of cervix  - Followed by [facility]. - s/p hysertectomy.  Follow up in 6 weeks with APP to assess volume status, medication compliance and BP, and with Dr. Haroldine Laws in 3 months as scheduled.  [place], FNP 05/19/21

## 2021-05-19 ENCOUNTER — Ambulatory Visit (HOSPITAL_COMMUNITY)
Admission: RE | Admit: 2021-05-19 | Discharge: 2021-05-19 | Disposition: A | Discharge status: Home / Self Care | Payer: Self-pay | Source: Ambulatory Visit | Attending: Family Medicine | Admitting: Family Medicine

## 2021-05-19 ENCOUNTER — Encounter (HOSPITAL_COMMUNITY): Payer: Self-pay

## 2021-05-19 ENCOUNTER — Other Ambulatory Visit: Payer: Self-pay

## 2021-05-19 VITALS — BP 102/60 | HR 88 | Wt 145.8 lb

## 2021-05-19 DIAGNOSIS — I340 Nonrheumatic mitral (valve) insufficiency: Secondary | ICD-10-CM

## 2021-05-19 DIAGNOSIS — I493 Ventricular premature depolarization: Secondary | ICD-10-CM

## 2021-05-19 DIAGNOSIS — I4819 Other persistent atrial fibrillation: Secondary | ICD-10-CM | POA: Insufficient documentation

## 2021-05-19 DIAGNOSIS — Z853 Personal history of malignant neoplasm of breast: Secondary | ICD-10-CM

## 2021-05-19 DIAGNOSIS — E0590 Thyrotoxicosis, unspecified without thyrotoxic crisis or storm: Secondary | ICD-10-CM

## 2021-05-19 DIAGNOSIS — R140 Abdominal distension (gaseous): Secondary | ICD-10-CM | POA: Insufficient documentation

## 2021-05-19 DIAGNOSIS — Z8249 Family history of ischemic heart disease and other diseases of the circulatory system: Secondary | ICD-10-CM | POA: Insufficient documentation

## 2021-05-19 DIAGNOSIS — Z09 Encounter for follow-up examination after completed treatment for conditions other than malignant neoplasm: Secondary | ICD-10-CM | POA: Insufficient documentation

## 2021-05-19 DIAGNOSIS — Z9119 Patient's noncompliance with other medical treatment and regimen: Secondary | ICD-10-CM | POA: Insufficient documentation

## 2021-05-19 DIAGNOSIS — R42 Dizziness and giddiness: Secondary | ICD-10-CM | POA: Insufficient documentation

## 2021-05-19 DIAGNOSIS — C50912 Malignant neoplasm of unspecified site of left female breast: Secondary | ICD-10-CM | POA: Insufficient documentation

## 2021-05-19 DIAGNOSIS — Z8541 Personal history of malignant neoplasm of cervix uteri: Secondary | ICD-10-CM | POA: Insufficient documentation

## 2021-05-19 DIAGNOSIS — I480 Paroxysmal atrial fibrillation: Secondary | ICD-10-CM

## 2021-05-19 DIAGNOSIS — Z7901 Long term (current) use of anticoagulants: Secondary | ICD-10-CM | POA: Insufficient documentation

## 2021-05-19 DIAGNOSIS — C539 Malignant neoplasm of cervix uteri, unspecified: Secondary | ICD-10-CM

## 2021-05-19 DIAGNOSIS — Z79899 Other long term (current) drug therapy: Secondary | ICD-10-CM | POA: Insufficient documentation

## 2021-05-19 DIAGNOSIS — I5022 Chronic systolic (congestive) heart failure: Secondary | ICD-10-CM

## 2021-05-19 DIAGNOSIS — I428 Other cardiomyopathies: Secondary | ICD-10-CM | POA: Insufficient documentation

## 2021-05-19 DIAGNOSIS — Z923 Personal history of irradiation: Secondary | ICD-10-CM | POA: Insufficient documentation

## 2021-05-19 DIAGNOSIS — Z8616 Personal history of COVID-19: Secondary | ICD-10-CM | POA: Insufficient documentation

## 2021-05-19 MED ORDER — FUROSEMIDE 40 MG PO TABS: 40.0000 mg | ORAL_TABLET | Freq: Every day | ORAL | 6 refills | Status: DC

## 2021-05-19 NOTE — Progress Notes (Signed)
ReDS Vest / Clip - 05/19/21 1000       ReDS Vest / Clip   Station Marker A    Ruler Value 31.5    ReDS Value Range Low volume    ReDS Actual Value 34

## 2021-05-19 NOTE — Patient Instructions (Addendum)
START Lasix 40 mg, one tab daily  Your physician recommends that you schedule a follow-up appointment in: 6 weeks  in the Advanced Practitioners (PA/NP) Clinic and keep follow up with Dr Haroldine Laws in 3 months  Your physician has requested that you regularly monitor and record your blood pressure readings at home. Please use the same machine at the same time of day to check your readings and record them to bring to your follow-up visit. -be sue to HOLD CARVEDILOL if systolic blood pressure (top number) is less than 100     Do the following things EVERYDAY: Weigh yourself in the morning before breakfast. Write it down and keep it in a log. Take your medicines as prescribed Eat low salt foods--Limit salt (sodium) to 2000 mg per day.  Stay as active as you can everyday Limit all fluids for the day to less than 2 liters  At the [facility], you and your health needs are our priority. As part of our continuing mission to provide you with exceptional heart care, we have created designated Provider Care Teams. These Care Teams include your primary Cardiologist (physician) and Advanced Practice Providers (APPs- Physician Assistants and Nurse Practitioners) who all work together to provide you with the care you need, when you need it.   You may see any of the following providers on your designated Care Team at your next follow up: Dr Glori Bickers Dr Loralie Champagne Dr Patrice Paradise, NP Lyda Jester, [place] Ginnie Smart Audry Riles, PharmD   Please be sure to bring in all your medications bottles to every appointment.

## 2021-06-05 ENCOUNTER — Other Ambulatory Visit: Payer: Self-pay

## 2021-06-05 ENCOUNTER — Other Ambulatory Visit (HOSPITAL_COMMUNITY): Payer: Self-pay

## 2021-06-05 MED ORDER — APIXABAN 5 MG PO TABS: 5.0000 mg | ORAL_TABLET | Freq: Two times a day (BID) | ORAL | 3 refills | Status: DC

## 2021-06-30 NOTE — Progress Notes (Incomplete)
Advanced Heart Failure Clinic Note   PCP: Pcp, No HF Cardiologist: Glori Bickers, MD  EP: Dr. Rayann Heman   Reason for Visit:f/u for Chronic Systolic Heart Failure   HPI: Megan Keith is a 60 y.o.female with chronic systolic CHF (Echo EF 54-65% in 03/2017)  PAF, and hyperthyroidisum.     Admitted to [facility] 8/18 with ADHF. Prior EF with Dr. [name] was 35%. Repeat echo EF 15%. Transferred to [facility]. R/LHC. no CAD. Fick output/index 5.3/3.3. Noted to have >20% PVC on telemetry. Started on amiodarone which she susequently stopped.   Echo 11/19 25-30%   Admitted 7/20 with ADHF in setting of new-onset AF, in the setting of hyperthyroidism. TEE with EF 10% RV ok. Started on amiodarone and diuresed. Underwent TEE/DC-CV and felt much better. Also treated with methimazole and prednisone.   Zio patch placed to monitor PVC burden.    Zio patch 8/20 - NSR avg HR 75 - 13 runs NSVT longest 14 beats - 59 runs SVT longest 9.4 seconds - PVC burden 4.8%   Admitted 10/20 for COVID 19 infection c/b recurrent afib w/ RVR. Was loaded on IV amiodarone and rate improved but did not convert back to NSR. Was placed on Eliquis and discharged home on PO digoxin and metoprolol.   Admitted from HF clinic 11/20 with A Fib RVR -> ADHF.Methimazole and prednisone continued for hyperthyroidism.  EP was consulted. Initial plans were for PVI ablation but procedure canceled after TEE showed severely reduced EF at 5% and severe LAE. Had DC-CV with conversion to NSR.  She was also treated w/ Losartan (BP too soft for Entesto), spironolactone and digoxin.   Saw Dr. Rayann Heman on 10/22/19 and was still in NSR. Discussed possible AF ablation. There was concern over degree of MR and LAE.   She finished treatment for hyperthyroidism with Dr. [name] 5/21.    Admitted [name] 9/21 She was reportedly treated for mild hemoptysis in setting of PNA and CHF. Treated w/ abx and lasix. Had low BP and was taken off losartan  and Coreg.    11/21 High PVC burden so Mexitil 200 mg bid started.   Repeat Echo (4/22) EF 20-25%   Zio (5/22) (on mexiletine). Occasional PVCs, burden low at 3.3%. Also had 32 runs of SVT, the longest lasting 10 beats w/ average HR 104 bpm.   Diagnosed w/ cervical cancer 7/22.  Underwent RHC 03/12/21 after having increased fatigue/decreased exercise tolerance; concern for low output. Found to have  markedly elevated filling pressures and preserved CO (see below). She was given metolazone 2.5 x 2 days and lasix increased to 60 mg daily.  S/p total hysertectomy 05/08/21, tolerated well. Lasix was held with low BP with instructions to restart when sBP>100.  Today she returns for HF follow up here with her granddaughter and interpretor. Surgery went well, pain is manageable with Tylenol. Abdomen feels bloated. Took lasix for 2 days after surgery then stopped because she was urinating a lot. Has some dizziness. Able to get around the house OK without SOB, gets more dyspneic with increased activity. Denies CP, edema, or PND/Orthopnea. Appetite ok. No fever or chills. Weight at home 138 pounds. Has been drinking >2L/fluid a day.  Cardiac Studies   Echo (8/18): EF 10-15% Echo (12/18): EF 25-30%  Echo (4/19): EF 25-30% Echo (11/19): EF ~30% (per Dr Haroldine Laws) Echo (8/20): EF 25-30%  TEE (11/20): EF 5-10% Echo (2/21): EF 30-35% moderate MR Echo (5/21): EF 25-30%. RV normal.  Echo (11/21): EF 20-25%  RV normal.  - RHC (7/22): Elevated filling pressures w/ preserved CO; prominent v-waves suggesting significant MR vs. diastolic dysfunction.   RA = 3 RV = 57/5 PA = 58/28 (43) PCW = 30 (v = 45) Fick cardiac output/index = 4.6/2.8 PVR = 2.8 WU FA sat = 95% PA sat = 67%, 69% SVC = 74%   Assessment: Markedly elevated filling pressures with preserved cardiac output. Prominent v-waves in the PCWP tracing suggestive of significant MR vs diastolic dysfunction   R/LHC (8/18): normal cors  Right  Heart RA (mean): 5 mmHg RV (S/EDP): 52/4 mmHg PA (S/D, mean): 52/20 (31) mmHg PCWP (mean): 30 mmHg Ao sat: 96% PA sat: 71% RA sat: 74% Fick CO: 5.3 L/min Fick CI: 3.3 L/min/m^2 SVR: 1,101 dynscm-5  - Zio patch (8/20): - NSR avg HR 75 - 13 runs NSVT longest 14 beats - 59 runs SVT longest 9.4 seconds - PVC burden 4.8%  - Zio Patch (5/22): 1. Sinus rhythm - avg HR of 79 bpm. 2. 32 runs of SVT - the longest lasting 10 beats with an avg rate of 104 bpm 3. 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 ? PVC cardiomyopathy. right and left heart catheterization on 04/15/17, no CAD. Fick output/index 5.28/3.34.   Noncompliance    a. episodic noncompliance.   Pneumonia 06/2016   Current Outpatient Medications  Medication Sig Dispense Refill   acetaminophen (TYLENOL) 325 MG tablet Take 325-650 mg by mouth every 6 (six) hours as needed (for pain.).     amiodarone (PACERONE) 200 MG tablet Take 100 mg by mouth daily.     anastrozole (ARIMIDEX) 1 MG tablet Take 1 tablet (1 mg total) by mouth in the morning. Patient takes it at night. 90 tablet 0   apixaban (ELIQUIS) 5 MG TABS tablet Take 1 tablet (5 mg total) by mouth 2 (two) times daily. 60 tablet 3   carvedilol (COREG) 3.125 MG tablet Take 1 tablet (3.125 mg total) by mouth 2 (two) times daily. 60 tablet 6   furosemide (LASIX) 40 MG tablet Take 1 tablet (40 mg total) by mouth daily. 30 tablet 6   mexiletine (MEXITIL) 200 MG capsule Take 1 capsule (200 mg  total) by mouth 2 (two) times daily. 60 capsule 6   omeprazole (PRILOSEC) 20 MG capsule Take 20 mg by mouth in the morning.     simethicone (MYLICON) 80 MG chewable tablet Chew by mouth.     spironolactone (ALDACTONE) 25 MG tablet Take 1 tablet (25 mg total) by mouth daily. 30 tablet 6   No current facility-administered medications for this visit.   No Known Allergies  Social History   Socioeconomic History   Marital status: Single    Spouse name: Not on file   Number of children: Not on file   Years of education: Not on file   Highest education level: Not on file  Occupational History  Not on file  Tobacco Use   Smoking status: Never   Smokeless tobacco: Never  Vaping Use   Vaping Use: Never used  Substance and Sexual Activity   Alcohol use: Not Currently    Comment: 04/15/2017 "might have a couple beers q couple months"   Drug use: Not Currently   Sexual activity: Not Currently  Other Topics Concern   Not on file  Social History Narrative   ** Merged History Encounter **       N/A   Social Determinants of Health   Financial Resource Strain: Not on file  Food Insecurity: Not on file  Transportation Needs: Not on file  Physical Activity: Not on file  Stress: Not on file  Social Connections: Not on file  Intimate Partner Violence: Not on file   Family History  Problem Relation Age of Onset   Hypertension Mother    There were no vitals taken for this visit.  Wt Readings from Last 3 Encounters:  05/19/21 66.1 kg (145 lb 12.8 oz)  05/04/21 65.3 kg (144 lb)  05/01/21 65.2 kg (143 lb 11.2 oz)   PHYSICAL EXAM: General:  NAD. No resp difficulty HEENT: Normal Neck: Supple. No JVD. Carotids 2+ bilat; no bruits. No lymphadenopathy or thryomegaly appreciated. Cor: PMI nondisplaced. Regular rate & rhythm. No rubs, gallops or murmurs. Lungs: Clear Abdomen: Soft, nontender, nondistended. No hepatosplenomegaly. No bruits or masses. Good bowel sounds. Extremities: No  cyanosis, clubbing, rash, edema Neuro: Alert & oriented x 3, cranial nerves grossly intact. Moves all 4 extremities w/o difficulty. Affect pleasant.  ReDs: 34%  ECG: SR with PVC (personally reviewed).  ASSESSMENT & PLAN:  1. Chronic systolic CHF: NICM, normal cors in 03/2017. 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  - Echo (03/2017):  EF 15-20%  - Echo (07/28/17): EF 25-30%  - Echo (06/2018): EF ~30% - TEE (11/20): EF 5% in setting of AF - Echo (2/21): EF 30-35% - Echo (5/21): EF 25-30%. RV ok  - Echo (07/08/20): EF 20-25% RV ok - Echo (4/22): 20-25%  - RHC (7/22): markedly elevated filling pressures and preserved CO, prominent v-waves - Stable NYHA III, Volume good today, ReDs Clip 34%. - Restart lasix 40 mg daily. - Continue Coreg 3.125 mg bid. Will give Rx for BP cuff. Hold carvedilol if SBP<100. - Continue spiro 25 mg daily.  - Intolerant to [place].  - BP has been too low for ARB/ARNI.  - Have not proceeded with ICD given non-compliance and hope that EF would improve with rhythm management.  - Labs from [facility] 05/09/21 reviewed personally and ok  - We discussed limiting fluids to less than 2 L a day and ensuring compliance with medications.  2. PAF  - S/p previous DC-CV in 7/20. 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   - Previously was on amio for PVCs but developed hyperthyroidism, now being treated w/ methimazole.  - Admitted 06/2019 for persistent afib w/ RVR + a/c systolic CHF. Loaded w/ IV amiodarone and evaluated by EP. Initial plans were for PVI ablation but procedure canceled after TEE showed severely reduced EF at 5% and severe LAE. Pt underwent DCCV instead.   - Saw Dr. Rayann Heman again in 2/21. Planned for AF ablation but does not have insurance coverage for AF ablation. Continue medical treatment. - Remains in NSR today on ECG. - Continue amio 100 mg daily. - Continue Eliquis 5 mg bid. No bleeding issues. - Recent amio labs ok.   3. MR - Prominent v-waves in PCWP tracing  on [place]  7/22 - No appreciable mumur noted on exam. - Consider TEE to evaluate severity of MR vs diastolic dysfunction.  4. 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  - Per Dr. Jasmine December, no further intervention needed.   5. H/o Frequent PVCs - Zio patch (8/20) with 5% PVC burden; Zio patch (3 days)  -11/21 with 16% PVC burden. - Zio (5/22) w/ only occasional PVCs, burden 3.3% on amio + mexilitine. - Continue amio and mexilitene  6. Left Breast Cancer - Treated w/ XRT.  7. Adenocarcinoma in situ of cervix  - Followed by [facility]. - s/p hysertectomy.  Follow up in 6 weeks with APP to assess volume status, medication compliance and BP, and with Dr. Haroldine Laws in 3 months as scheduled.  [place], FNP 06/30/21

## 2021-07-01 ENCOUNTER — Encounter (HOSPITAL_COMMUNITY): Payer: Self-pay

## 2021-07-08 ENCOUNTER — Ambulatory Visit: Payer: Self-pay | Admitting: Internal Medicine

## 2021-07-14 ENCOUNTER — Other Ambulatory Visit: Payer: Self-pay | Admitting: Hematology and Oncology

## 2021-07-14 DIAGNOSIS — C50412 Malignant neoplasm of upper-outer quadrant of left female breast: Secondary | ICD-10-CM

## 2021-07-23 ENCOUNTER — Other Ambulatory Visit: Payer: Self-pay | Admitting: Hematology and Oncology

## 2021-07-23 MED ORDER — HYDROXYZINE PAMOATE 25 MG PO CAPS: 25.0000 mg | ORAL_CAPSULE | Freq: Three times a day (TID) | ORAL | 0 refills | Status: DC | PRN

## 2021-07-30 NOTE — Progress Notes (Signed)
Sent in [employer], patient currently has no insurance.

## 2021-08-03 ENCOUNTER — Inpatient Hospital Stay: Payer: Self-pay | Attending: Oncology | Admitting: Hematology and Oncology

## 2021-08-03 DIAGNOSIS — N898 Other specified noninflammatory disorders of vagina: Secondary | ICD-10-CM

## 2021-08-03 MED ORDER — VAGISIL ANTI-ITCH MEDICATED 1 % EX MISC: 1.0000 "application " | Freq: Three times a day (TID) | CUTANEOUS | 3 refills | Status: DC | PRN

## 2021-08-03 NOTE — Progress Notes (Signed)
Pt was seen in clinic, as an add on, after her radiation treatment today. Dr Orlene Erm is not in office today, so I asked Melissa to evaluate pt. Pt reports intense itching @ her female genitalia and anus, sometimes burning. She states Dr Orlene Erm gave her some cream last week, but it hasn't helped. Melissa,NP, requested to evaluate the area, pt agreed. Pt is spanish speaking only, so the hospital interpreter, Karena Addison, was present for the entire assessment & conversation. I assisted Melissa,NP, as she evaluated the area and recommended sitz baths and Vagisil cream/wipes (Dee stood behind the curtain during the evaluation of perineal area). Melissa told pt that the area was not infected. No yeast. No discharge. The symptoms were most likely coming from the radiation. Pt also mentioned that she has intermittent diarrhea, that is controlled with Imodium. Pt was given information in Spanish on how to do sitz bath & tips for preventing diarrhea. Dee, interpreter, read the steps for sitz bath to pt in spanish to allow pt to ask questions.  Pt denies N/V. She does admit to increased fatigue. I explained that fatigue is the number one side effect of cancer treatment. I encouraged her to do things that she needs to, first thing each morning when she has more energy. Afebrile.

## 2021-08-03 NOTE — Progress Notes (Cosign Needed)
Patient Care Team: Pcp, No as PCP - General (Family Medicine) Bensimhon, Shaune Pascal, MD as PCP - Cardiology (Cardiology) Gatha Mayer, MD as Consulting Physician (Radiation Oncology) Kerin Perna, NP (Internal Medicine) Marice Potter, MD as Consulting Physician (Oncology)  Clinic Day:  08/03/2021  Referring physician: [facility]  ASSESSMENT & PLAN:   Assessment & Plan: Vaginal itching Currently undergoing radiation for treatment of cervical cancer. She complains of vaginal/ anal itching. Pelvic exam is negative for yeast. Vaginal area appears dry and irritated. We discussed using over the counter Vagisil for itching. This is most likely related to her radiation treatments.    The patient understands the plans discussed today and is in agreement with them.  She knows to contact our office if she develops concerns prior to her next appointment.    Melodye Ped, NP  [place] [address] [place] [address] Dept: [phone] Dept Fax: [phone]   No orders of the defined types were placed in this encounter.     CHIEF COMPLAINT:  CC: A 60 year old female with history of cervical cancer undergoing radiation here for symptom management of vaginal itching  Current Treatment:  Radiation therapy  INTERVAL HISTORY:  Megan Keith is here today for repeat clinical assessment. She denies fevers or chills. She denies pain. Her appetite is good. Her weight has been stable.  I have reviewed the past medical history, past surgical history, social history and family history with the patient and they are unchanged from previous note.  ALLERGIES:  has No Known Allergies.  MEDICATIONS:  Current Outpatient Medications  Medication Sig Dispense Refill   anastrozole (ARIMIDEX) 1 MG tablet TAKE 1 TABLET BY MOUTH IN THE MORNING. PATIENT TAKES IT AT NIGHT. 90 tablet 0   Pramoxine HCl (VAGISIL  ANTI-ITCH MEDICATED) 1 % MISC Apply 1 application topically 3 (three) times daily as needed. 25 each 3   acetaminophen (TYLENOL) 325 MG tablet Take 325-650 mg by mouth every 6 (six) hours as needed (for pain.).     amiodarone (PACERONE) 200 MG tablet Take 100 mg by mouth daily.     apixaban (ELIQUIS) 5 MG TABS tablet Take 1 tablet (5 mg total) by mouth 2 (two) times daily. 60 tablet 3   carvedilol (COREG) 3.125 MG tablet Take 1 tablet (3.125 mg total) by mouth 2 (two) times daily. 60 tablet 6   furosemide (LASIX) 40 MG tablet Take 1 tablet (40 mg total) by mouth daily. 30 tablet 6   hydrOXYzine (VISTARIL) 25 MG capsule Take 1 capsule (25 mg total) by mouth 3 (three) times daily as needed. 30 capsule 0   mexiletine (MEXITIL) 200 MG capsule Take 1 capsule (200 mg total) by mouth 2 (two) times daily. 60 capsule 6   omeprazole (PRILOSEC) 20 MG capsule Take 20 mg by mouth in the morning.     simethicone (MYLICON) 80 MG chewable tablet Chew by mouth.     spironolactone (ALDACTONE) 25 MG tablet Take 1 tablet (25 mg total) by mouth daily. 30 tablet 6   No current facility-administered medications for this visit.    HISTORY OF PRESENT ILLNESS:   Oncology History   No history exists.      REVIEW OF SYSTEMS:   Constitutional: Denies fevers, chills or abnormal weight loss Eyes: Denies blurriness of vision Ears, nose, mouth, throat, and face: Denies mucositis or sore throat Respiratory: Denies cough, dyspnea or wheezes Cardiovascular: Denies palpitation, chest  discomfort or lower extremity swelling Gastrointestinal:  Denies nausea, heartburn or change in bowel habits Skin: Denies abnormal skin rashes Lymphatics: Denies new lymphadenopathy or easy bruising Neurological:Denies numbness, tingling or new weaknesses Behavioral/Psych: Mood is stable, no new changes  All other systems were reviewed with the patient and are negative.   VITALS:  Blood pressure 107/78, pulse (!) 108, temperature 98.8  F (37.1 C), temperature source Oral, resp. rate 18, SpO2 98 %.  Wt Readings from Last 3 Encounters:  05/19/21 145 lb 12.8 oz (66.1 kg)  05/04/21 144 lb (65.3 kg)  05/01/21 143 lb 11.2 oz (65.2 kg)    There is no height or weight on file to calculate BMI.  Performance status (ECOG): 1 - Symptomatic but completely ambulatory  PHYSICAL EXAM:   GENERAL:alert, no distress and comfortable SKIN: skin color, texture, turgor are normal, no rashes or significant lesions EYES: normal, Conjunctiva are pink and non-injected, sclera clear OROPHARYNX:no exudate, no erythema and lips, buccal mucosa, and tongue normal  NECK: supple, thyroid normal size, non-tender, without nodularity LYMPH:  no palpable lymphadenopathy in the cervical, axillary or inguinal LUNGS: clear to auscultation and percussion with normal breathing effort HEART: regular rate & rhythm and no murmurs and no lower extremity edema ABDOMEN:abdomen soft, non-tender and normal bowel sounds Musculoskeletal:no cyanosis of digits and no clubbing  NEURO: alert & oriented x 3 with fluent speech, no focal motor/sensory deficits  LABORATORY DATA:  I have reviewed the data as listed    Component Value Date/Time   NA 136 03/24/2021 1120   K 4.2 03/24/2021 1120   CL 106 03/24/2021 1120   CO2 22 03/24/2021 1120   GLUCOSE 97 03/24/2021 1120   BUN 6 03/24/2021 1120   CREATININE 0.60 03/24/2021 1120   CALCIUM 8.9 03/24/2021 1120   PROT 6.6 03/10/2021 1550   ALBUMIN 3.8 03/10/2021 1550   AST 25 03/10/2021 1550   ALT 18 03/10/2021 1550   ALKPHOS 104 03/10/2021 1550   BILITOT 0.5 03/10/2021 1550   GFRNONAA >60 03/24/2021 1120   GFRAA >60 05/20/2020 1523    No results found for: SPEP, UPEP  Lab Results  Component Value Date   WBC 17.5 (H) 03/24/2021   NEUTROABS 12.5 (H) 06/08/2019   HGB 13.1 03/24/2021   HCT 41.4 03/24/2021   MCV 92.6 03/24/2021   PLT 373 03/24/2021      Chemistry      Component Value Date/Time   NA 136  03/24/2021 1120   K 4.2 03/24/2021 1120   CL 106 03/24/2021 1120   CO2 22 03/24/2021 1120   BUN 6 03/24/2021 1120   CREATININE 0.60 03/24/2021 1120      Component Value Date/Time   CALCIUM 8.9 03/24/2021 1120   ALKPHOS 104 03/10/2021 1550   AST 25 03/10/2021 1550   ALT 18 03/10/2021 1550   BILITOT 0.5 03/10/2021 1550       RADIOGRAPHIC STUDIES: I have personally reviewed the radiological images as listed and agreed with the findings in the report. No results found.

## 2021-08-03 NOTE — Assessment & Plan Note (Signed)
Currently undergoing radiation for treatment of cervical cancer. She complains of vaginal/ anal itching. Pelvic exam is negative for yeast. Vaginal area appears dry and irritated. We discussed using over the counter Vagisil for itching. This is most likely related to her radiation treatments.

## 2021-08-04 ENCOUNTER — Ambulatory Visit (INDEPENDENT_AMBULATORY_CARE_PROVIDER_SITE_OTHER): Payer: Self-pay | Admitting: Internal Medicine

## 2021-08-04 ENCOUNTER — Encounter: Payer: Self-pay | Admitting: Internal Medicine

## 2021-08-04 ENCOUNTER — Other Ambulatory Visit: Payer: Self-pay

## 2021-08-04 VITALS — BP 110/68 | HR 106 | Ht 60.0 in | Wt 145.8 lb

## 2021-08-04 DIAGNOSIS — T462X5A Adverse effect of other antidysrhythmic drugs, initial encounter: Secondary | ICD-10-CM

## 2021-08-04 DIAGNOSIS — E064 Drug-induced thyroiditis: Secondary | ICD-10-CM

## 2021-08-04 DIAGNOSIS — E041 Nontoxic single thyroid nodule: Secondary | ICD-10-CM

## 2021-08-04 NOTE — Patient Instructions (Signed)
Please stop at the lab.  Please come back in 1 year but with lab at 6 months.

## 2021-08-04 NOTE — Progress Notes (Signed)
Patient ID: Megan Keith, female   DOB: Feb 25, 1961, 60 y.o.   MRN: [id number]   This visit occurred during the SARS-CoV-2 public health emergency.  Safety protocols were in place, including screening questions prior to the visit, additional usage of staff PPE, and extensive cleaning of exam room while observing appropriate contact time as indicated for disinfecting solutions.   HPI  Megan Keith is a 60 y.o.-year-old female, initially referred by her cardiologist, Dr. 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  She is accompanied by her granddaughter, Sheppard Coil, who translates for [place].  Last visit 7 months ago.  Interim history: She has occasional palpitations, no tremors, no heat intolerance, no wt loss. Last visit, she was diagnosed with cervical adenocarcinoma in situ and had total hysterectomy in 04/2021. She started to have palpitations after the surgery.  She has an appointment with cardiology at the end of the week.  Reviewed and addended history: Patient has been on amiodarone for "several years".  She is taking 200 mg daily.  Per review of the chart, she has had abnormal thyroid test at least since 2019.  She does remember being told in the past that the tests were abnormal.  We started prednisone 5 mg daily in 12/2019 - continued until 03/2020 as this was not further refilled by pharmacy.  At last visit, in 12/2020, we continued without medication.  We reviewed her TFTs: Lab Results  Component Value Date   TSH 0.53 02/11/2021   TSH 0.31 (L) 12/31/2020   TSH 0.329 (L) 04/07/2020   TSH 0.15 (L) 12/28/2019   TSH 0.53 09/28/2019   TSH 0.259 (L) 08/27/2019   TSH 0.354 07/03/2019   TSH 0.393 06/06/2019   TSH 0.575 05/07/2019   TSH 0.024 (L) 04/03/2019   FREET4 1.16 02/11/2021   FREET4 1.71 (H) 12/31/2020   FREET4 1.66 (H) 12/28/2019   FREET4 1.56 09/28/2019   FREET4 1.75 (H) 08/27/2019   FREET4 1.35 (H) 07/03/2019   FREET4 1.63  (H) 06/06/2019   FREET4 1.09 05/07/2019   FREET4 1.65 (H) 04/03/2019   FREET4 2.43 (H) 03/05/2019   T3FREE 4.6 (H) 02/11/2021   T3FREE 3.1 12/31/2020   T3FREE 3.6 12/28/2019   T3FREE 3.8 09/28/2019   T3FREE 3.3 08/27/2019   T3FREE 3.7 07/03/2019   T3FREE 3.0 05/07/2019   T3FREE 3.6 04/03/2019   T3FREE 2.5 03/13/2018   Her Graves' antibodies were not elevated: Lab Results  Component Value Date   TSI <89 09/28/2019   Thyroid ultrasound (03/07/2019): 0.8 cm hypoechoic thyroid nodule, not worrisome. Minimal blood flow. No follow-up needed.  Pt denies: - feeling nodules in neck - hoarseness - choking - SOB with lying down She has occasional dysphagia.   She initially described: - + mild fluctuations in weight - + fatigue - occasionally - once a week  - + insomnia - chronic - + excessive sweating/heat intolerance -especially at night, especially on L side - + tremors - + anxiety - + rarely palpitations - no hyperdefecation - + hair loss The symptoms resolved since.  Pt does not have a FH of thyroid ds. No FH of thyroid cancer. No h/o radiation tx to head or neck.   No steroid use.  No herbal supplements. No Biotin use.   Pt. also has a history of BrCA - + RxTx.  ROS: Gastrointestinal: no N/no V/no D/no C/+ acid reflux Musculoskeletal: + Muscle aches/+ joint aches  I reviewed pt's medications, allergies, PMH, social hx, family hx, and  changes were documented in the history of present illness. Otherwise, unchanged from my initial visit note.  Past Medical History:  Diagnosis Date   A-fib ([place])    Abnormal TSH    Anemia    Arthritis    "knees, hands" (04/15/2017)   Breast cancer (HCC)    CHF (congestive heart failure) (HCC)    Chronic knee pain    Chronic systolic CHF (congestive heart failure) ([place])    COVID-19    Does not have health insurance    Frequent PVCs    Hyperthyroidism 03/05/2019   Hyperthyroidism 03/05/2019   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  ([place]) 04/15/2017   NICM (nonischemic cardiomyopathy) ([place])    a. ? PVC cardiomyopathy. right and left heart catheterization on 04/15/17, no CAD. Fick output/index 5.28/3.34.   Noncompliance    a. episodic noncompliance.   Pneumonia 06/2016   Past Surgical History:  Procedure Laterality Date   BREAST CYST EXCISION Left    CARDIOVERSION N/A 03/09/2019   Procedure: CARDIOVERSION;  Surgeon: Jolaine Artist, MD;  Location: [facility] ENDOSCOPY;  Service: Cardiovascular;  Laterality: N/A;   CARDIOVERSION N/A 07/05/2019   Procedure: CARDIOVERSION;  Surgeon: Thompson Grayer, MD;  Location: [place] CV LAB;  Service: Cardiovascular;  Laterality: N/A;   CORONARY/GRAFT ANGIOGRAPHY N/A 04/15/2017   Procedure: CORONARY/GRAFT ANGIOGRAPHY;  Surgeon: Nelva Bush, MD;  Location: [place] [facility] LAB;  Service: Cardiovascular;  Laterality: N/A;   RIGHT HEART CATH N/A 04/15/2017   Procedure: RIGHT HEART CATH;  Surgeon: Nelva Bush, MD;  Location: [place] [facility] LAB;  Service: Cardiovascular;  Laterality: N/A;   RIGHT HEART CATH N/A 03/12/2021   Procedure: RIGHT HEART CATH;  Surgeon: Jolaine Artist, MD;  Location: [place] [facility] LAB;  Service: Cardiovascular;  Laterality: N/A;   TEE WITHOUT CARDIOVERSION N/A 03/09/2019   Procedure: TRANSESOPHAGEAL ECHOCARDIOGRAM (TEE);  Surgeon: Jolaine Artist, MD;  Location: [facility] ENDOSCOPY;  Service: Cardiovascular;  Laterality: N/A;   TEE WITHOUT CARDIOVERSION N/A 07/05/2019   Procedure: TRANSESOPHAGEAL ECHOCARDIOGRAM (TEE);  Surgeon: Thompson Grayer, MD;  Location: [place] CV LAB;  Service: Cardiovascular;  Laterality: N/A;   TEE WITHOUT CARDIOVERSION N/A 04/21/2021   Procedure: TRANSESOPHAGEAL ECHOCARDIOGRAM (TEE);  Surgeon: Fay Records, MD;  Location: [facility] ENDOSCOPY;  Service: Cardiovascular;  Laterality: N/A;   TUBAL LIGATION     Social History   Socioeconomic History   Marital status: Single    Spouse name: Not on file   Number of children: 4   Years of  education: Not on file   Highest education level: Not on file  Occupational History   Not on file  Tobacco Use   Smoking status: Never Smoker   Smokeless tobacco: Never Used  Substance and Sexual Activity   Alcohol use: Not Currently    Comment: 04/15/2017 "might have a couple beers q couple months"   Drug use: No   Sexual activity: Not Currently  Other Topics Concern   Not on file  Social History Narrative   N/A   Social Determinants of Health   Financial Resource Strain:    Difficulty of Paying Living Expenses: Not on file  Food Insecurity: Unknown   Worried About [place] in the Last Year: Patient refused   [place] in the Last Year: Patient refused  Transportation Needs: Unknown   Film/video editor (Medical): Patient refused   Lack of Transportation (Non-Medical): Patient refused  Physical Activity: Unknown   Days of Exercise per Week:  Patient refused   Minutes of Exercise per Session: Patient refused  Stress: No Stress Concern Present   Feeling of Stress : Only a little  Social Connections: Unknown   Frequency of Communication with Friends and Family: Patient refused   Frequency of Social Gatherings with Friends and Family: Patient refused   Attends Religious Services: Patient refused   Marine scientist or Organizations: Patient refused   Attends Music therapist: Patient refused   Marital Status: Patient refused  Intimate Production manager Violence: Unknown   Fear of Current or Ex-Partner: Patient refused   Emotionally Abused: Patient refused   Physically Abused: Patient refused   Sexually Abused: Patient refused   Current Outpatient Medications on File Prior to Visit  Medication Sig Dispense Refill   anastrozole (ARIMIDEX) 1 MG tablet TAKE 1 TABLET BY MOUTH IN THE MORNING. PATIENT TAKES IT AT NIGHT. 90 tablet 0   acetaminophen (TYLENOL) 325 MG tablet Take 325-650 mg by mouth every 6 (six) hours as needed (for pain.).      amiodarone (PACERONE) 200 MG tablet Take 100 mg by mouth daily.     apixaban (ELIQUIS) 5 MG TABS tablet Take 1 tablet (5 mg total) by mouth 2 (two) times daily. 60 tablet 3   carvedilol (COREG) 3.125 MG tablet Take 1 tablet (3.125 mg total) by mouth 2 (two) times daily. 60 tablet 6   furosemide (LASIX) 40 MG tablet Take 1 tablet (40 mg total) by mouth daily. 30 tablet 6   hydrOXYzine (VISTARIL) 25 MG capsule Take 1 capsule (25 mg total) by mouth 3 (three) times daily as needed. 30 capsule 0   mexiletine (MEXITIL) 200 MG capsule Take 1 capsule (200 mg total) by mouth 2 (two) times daily. 60 capsule 6   omeprazole (PRILOSEC) 20 MG capsule Take 20 mg by mouth in the morning.     Pramoxine HCl (VAGISIL ANTI-ITCH MEDICATED) 1 % MISC Apply 1 application topically 3 (three) times daily as needed. 25 each 3   simethicone (MYLICON) 80 MG chewable tablet Chew by mouth.     spironolactone (ALDACTONE) 25 MG tablet Take 1 tablet (25 mg total) by mouth daily. 30 tablet 6   No current facility-administered medications on file prior to visit.   No Known Allergies Family History  Problem Relation Age of Onset   Hypertension Mother    PE: BP 110/68 (BP Location: Right Arm, Patient Position: Sitting, Cuff Size: Normal)    Pulse (!) 106    Ht 5' (1.524 m)    Wt 145 lb 12.8 oz (66.1 kg)    SpO2 98%    BMI 28.47 kg/m  Wt Readings from Last 3 Encounters:  08/04/21 145 lb 12.8 oz (66.1 kg)  05/19/21 145 lb 12.8 oz (66.1 kg)  05/04/21 144 lb (65.3 kg)   Constitutional: normal weight, in NAD Eyes: PERRLA, EOMI, no exophthalmos, no lid lag, no stare ENT: moist mucous membranes, no thyromegaly, no nodules palpated in neck, no cervical lymphadenopathy Cardiovascular: tachycardia, irreg. Irreg. rhythm, No MRG Respiratory: CTA B Musculoskeletal: no deformities, strength intact in all 4 Skin: moist, warm, no rashes Neurological: no tremor with outstretched hands, DTR normal in all 4  ASSESSMENT: 1.  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  Thyroid nodule  PLAN:  1. Patient with a chronic history of thyrotoxicosis, with possible thyrotoxic symptoms including heat intolerance (although this can be related to menopausal hot flashes), anxiety, insomnia.  She also had occasional palpitations, which resolved.  It  is likely that her thyrotoxicosis was induced by amiodarone (AIT). -At last visit, we discussed that AIT type I occurs in patients with underlying thyroid pathology and usually indicates hyperproduction of thyroid hormones, while AIT type II is a consequence of subacute thyroiditis with amiodarone induced release of preformed thyroid hormones into the circulation.  These entities are treated differently, but occasionally it is difficult to differentiate between the 2 and we treat both at the same time.  Treatment for AIT type I is with methimazole and for AIT type II with prednisone. 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  She also did not return for labs afterwards but it appears to be improved as per cardiology check in 03/2020 -She returned to see me in 12/2020, after a dose reduction in amiodarone from 200 to 100 mg daily by cardiology.  At that time, TFTs were only mildly abnormal: TSH was mildly low, at 0.31, free T3 was normal and free T4 was slightly elevated.  She denied thyrotoxic symptoms then.  Heart rate was only slightly elevated.  We did not start any medication at that time. -In 01/2021, she had a new set of TFTs checked and these were all normal -At this visit, she denies thyrotoxic symptoms other than palpitations.  She mentions that these started in 04/2021 around the time of her hysterectomy.  At today's visit, she  is tachycardic.  Upon questioning, she is not sure whether she is taking amiodarone.  She brought her medications with her and I do not see amiodarone in there.  She and her granddaughter feel that it may be at home.  They will look.  After reviewing the latest cardiology note in which it was mentions that she was to continue the 100 mg amiodarone daily, I strongly advised her to restart it right away.  She also has an appointment with cardiology in few days. -We will recheck her TFTs now -I will see her back in 1 year but in 6 months for labs  2.  Thyroid nodule -In the right inferior lobe of the thyroid -There is a small, measuring 0.8 cm -She has occasional dysphagia, with certain foods, no other neck compression symptoms -We will continue to follow her clinically for this  For results, call granddaughter [name]: [phone]  Component     Latest Ref Rng & Units 08/04/2021  TSH     0.35 - 5.50 uIU/mL 0.30 (L)  Triiodothyronine,Free,Serum     2.3 - 4.2 pg/mL 3.9  T4,Free(Direct)     0.60 - 1.60 ng/dL 1.37  TSH is only slightly low now, with normal free T4 and free T3.  I would like to repeat the labs in 1 month, but no intervention is needed for now.  Philemon Kingdom, MD PhD [facility] Endocrinology

## 2021-08-05 LAB — T3, FREE: T3, Free: 3.9 pg/mL (ref 2.3–4.2)

## 2021-08-05 LAB — TSH: TSH: 0.3 u[IU]/mL — ABNORMAL LOW (ref 0.35–5.50)

## 2021-08-05 LAB — T4, FREE: Free T4: 1.37 ng/dL (ref 0.60–1.60)

## 2021-08-06 NOTE — Progress Notes (Signed)
Advanced Heart Failure Clinic Note   Megan Keith: Megan Keith, No HF Cardiologist: Megan Bickers, MD  EP: Megan. Rayann Keith   Reason for Visit:f/u for Chronic Systolic Heart Failure   HPI: Megan Keith is a 60 y.o.female with chronic systolic CHF (Echo EF 38-10% in 03/2017)  PAF, and hyperthyroidisum.     Admitted to [facility] 8/18 with ADHF. Prior EF with Megan. Bettina Keith was 35%. Repeat echo EF 15%. Transferred to [facility]. R/LHC. no CAD. Fick output/index 5.3/3.3. Noted to have >20% PVC on telemetry. Started on amiodarone which she susequently stopped.   Echo 11/19 25-30%   Admitted 7/20 with ADHF with new-onset AF in the setting of hyperthyroidism. TEE with EF 10% RV ok. Started on amiodarone and diuresed. Underwent TEE/DC-CV and felt much better. Also treated with methimazole and prednisone.   Zio patch placed to monitor PVC burden.    Zio patch 8/20 - NSR avg HR 75 - 13 runs NSVT longest 14 beats - 59 runs SVT longest 9.4 seconds - PVC burden 4.8%   Admitted 10/20 for COVID 19 infection c/b recurrent afib w/ RVR. Was loaded on IV amiodarone and rate improved but did not convert back to NSR. Was placed on Eliquis and discharged home on PO digoxin and metoprolol.   Admitted from HF clinic 11/20 with A Fib RVR -> ADHF.Methimazole and prednisone continued for hyperthyroidism.  EP was consulted. Initial plans were for PVI ablation but procedure canceled after TEE showed severely reduced EF at 5% and severe LAE. Had DC-CV with conversion to NSR.  She was also treated w/ Losartan (BP too soft for Entesto), spironolactone and digoxin.   Saw Megan. Rayann Keith on 10/22/19 and was still in NSR. Discussed possible AF ablation. There was concern over degree of MR and LAE.   She finished treatment for hyperthyroidism with Megan. Renne Keith 5/21.    Admitted Megan Keith 9/21 She was reportedly treated for mild hemoptysis in setting of PNA and CHF. Treated w/ abx and lasix. Had low BP and was taken off losartan and  Megan Keith.    11/21 High PVC burden so Mexitil 200 mg bid started.   Repeat Echo (4/22) EF 20-25%   Zio (5/22) (on mexiletine). Occasional PVCs, burden low at 3.3%. Also had 32 runs of SVT, the longest lasting 10 beats w/ average HR 104 bpm.   Diagnosed w/ cervical cancer 7/22.  Underwent RHC 03/12/21 after having increased fatigue/decreased exercise tolerance; concern for low output. Found to have  markedly elevated filling pressures and preserved CO (see below). She was given metolazone 2.5 x 2 days and lasix increased to 60 mg daily.  S/p total hysertectomy 05/08/21, tolerated well. Lasix was held with low BP with instructions to restart when sBP>100.  Today she returns for HF follow up here with her granddaughter and interpreter. She feels very tired. Doing ADLs but tired. No real SOB. No edema. Has been out of amiodarone for 3 months. Feels her heart flutter at times.    Cardiac Studies   Echo (8/18): EF 10-15% Echo (12/18): EF 25-30%  Echo (4/19): EF 25-30% Echo (11/19): EF ~30% (per Megan Keith) Echo (8/20): EF 25-30%  TEE (11/20): EF 5-10% Echo (2/21): EF 30-35% moderate MR Echo (5/21): EF 25-30%. RV normal.  Echo (11/21): EF 20-25% RV normal.  - RHC (7/22): Elevated filling pressures w/ preserved CO; prominent v-waves suggesting significant MR vs. diastolic dysfunction.   RA = 3 RV = 57/5 PA = 58/28 (43) PCW = 30 (v = 45) Fick  cardiac output/index = 4.6/2.8 PVR = 2.8 WU FA sat = 95% PA sat = 67%, 69% SVC = 74%   Assessment: Markedly elevated filling pressures with preserved cardiac output. Prominent v-waves in the PCWP tracing suggestive of significant MR vs diastolic dysfunction   R/LHC (8/18): normal cors  Right Heart RA (mean): 5 mmHg RV (S/EDP): 52/4 mmHg PA (S/D, mean): 52/20 (31) mmHg PCWP (mean): 30 mmHg Ao sat: 96% PA sat: 71% RA sat: 74% Fick CO: 5.3 L/min Fick CI: 3.3 L/min/m^2 SVR: 1,101 dynscm-5  - Zio patch (8/20): - NSR avg HR 75 - 13  runs NSVT longest 14 beats - 59 runs SVT longest 9.4 seconds - PVC burden 4.8%  - Zio Patch (5/22): 1. Sinus rhythm - avg HR of 79 bpm. 2. 32 runs of SVT - the longest lasting 10 beats with an avg rate of 104 bpm 3. PVCs  were occasional (3.3%, 30573)  Past Medical History:  Diagnosis Date   A-fib ([place])    Abnormal TSH    Anemia    Arthritis    "knees, hands" (04/15/2017)   Breast cancer (HCC)    CHF (congestive heart failure) (HCC)    Chronic knee pain    Chronic systolic CHF (congestive heart failure) ([place])    COVID-19    Does not have health insurance    Frequent PVCs    Hyperthyroidism 03/05/2019   Hyperthyroidism 03/05/2019   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 ([place]) 04/15/2017   NICM (nonischemic cardiomyopathy) ([place])    a. ? PVC cardiomyopathy. right and left heart catheterization on 04/15/17, no CAD. Fick output/index 5.28/3.34.   Noncompliance    a. episodic noncompliance.   Pneumonia 06/2016   Current Outpatient Medications  Medication Sig Dispense Refill   acetaminophen (TYLENOL) 325 MG tablet Take 325-650 mg by mouth every 6 (six) hours as needed (for pain.).     anastrozole (ARIMIDEX) 1 MG tablet TAKE 1 TABLET BY MOUTH IN THE MORNING. PATIENT TAKES IT AT NIGHT. 90 tablet 0   apixaban (ELIQUIS) 5 MG TABS tablet Take 1 tablet (5 mg total) by mouth 2 (two) times daily. 60 tablet 3   carvedilol (Megan Keith) 3.125 MG tablet Take 1 tablet (3.125 mg total) by mouth 2 (two) times daily. 60 tablet 6   furosemide (LASIX) 40 MG tablet Take 1 tablet (40 mg total) by mouth daily. 30 tablet 6   hydrOXYzine (VISTARIL) 25 MG capsule Take 1 capsule (25 mg total) by mouth 3 (three) times daily as needed. 30 capsule 0   mexiletine (MEXITIL) 200 MG capsule Take 1 capsule (200 mg total) by mouth 2 (two) times daily. 60 capsule 6   omeprazole (PRILOSEC) 20 MG capsule Take 20 mg by mouth in the morning.     spironolactone (ALDACTONE) 25 MG tablet Take 1 tablet (25 mg total) by mouth daily. 30  tablet 6   No current facility-administered medications for this encounter.   No Known Allergies  Social History   Socioeconomic History   Marital status: Single    Spouse name: Not on file   Number of children: Not on file   Years of education: Not on file   Highest education level: Not on file  Occupational History   Not on file  Tobacco Use   Smoking status: Never   Smokeless tobacco: Never  Vaping Use   Vaping Use: Never used  Substance and Sexual Activity   Alcohol use: Not Currently    Comment: 04/15/2017 "might have a  couple beers q couple months"   Drug use: Not Currently   Sexual activity: Not Currently  Other Topics Concern   Not on file  Social History Narrative   ** Merged History Encounter **       N/A   Social Determinants of Health   Financial Resource Strain: Not on file  Food Insecurity: Not on file  Transportation Needs: Not on file  Physical Activity: Not on file  Stress: Not on file  Social Connections: Not on file  Intimate Partner Violence: Not on file   Family History  Problem Relation Age of Onset   Hypertension Mother    BP 90/70    Pulse 99    Wt 66.2 kg (146 lb)    SpO2 100%    BMI 28.51 kg/m   Wt Readings from Last 3 Encounters:  08/07/21 66.2 kg (146 lb)  08/04/21 66.1 kg (145 lb 12.8 oz)  05/19/21 66.1 kg (145 lb 12.8 oz)   PHYSICAL EXAM: General:  Walked into clinic No resp difficulty HEENT: normal Neck: supple.jvp 6. Carotids 2+ bilat; no bruits. No lymphadenopathy or thryomegaly appreciated. Cor: PMI nondisplaced. Irregular tachy Lungs: clear Abdomen: soft, nontender, nondistended. No hepatosplenomegaly. No bruits or masses. Good bowel sounds. Extremities: no cyanosis, clubbing, rash,trace  edema Neuro: alert & orientedx3, cranial nerves grossly intact. moves all 4 extremities w/o difficulty. Affect pleasant  ECG: Ectopic atrial tach 103 Personally reviewed   ASSESSMENT & PLAN:  1. Chronic systolic HF: NICM, normal  cors in 03/2017. 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  - Echo (03/2017):  EF 15-20%  - Echo (06/2018): EF ~30% - TEE (11/20): EF 5% in setting of AF - Echo (2/21): EF 30-35% - Echo (07/08/20): EF 20-25% RV ok - Echo (4/22): 20-25%  - RHC (7/22): markedly elevated filling pressures and preserved CO, prominent v-waves - More fatigued today. NYHA III in setting on ectopic atrial tachycardia - > start amio - Volume status looks ok. ReDS 35% - Continue lasix 40 mg daily. - Continue Megan Keith 3.125 mg bid. - Continue spiro 25 mg daily.  - Intolerant to [place].  - BP has been too low for ARB/ARNI.  - Have not proceeded with ICD given non-compliance and hope that EF would improve with rhythm management.  - See back in several weeks to re-evaluate rhythm and clinical status  2. PAF/ectopic atrial tachycardia - S/p previous DC-CV in 7/20. 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   - Previously was on amio for PVCs but developed hyperthyroidism, now being treated w/ methimazole.  - 06/2019 recurrent AF w/ RVR. Loaded w/amio. Evaluated by EP. Initial plans were for PVI ablation but cancelled after TEE showed EF 5% and severe LAE. Pt underwent DCCV instead.   - Saw Megan. Rayann Keith again in 2/21. Planned for AF ablation but does not have insurance coverage for AF ablation. Continue medical treatment. - Clinically worse with EAT today -> Restart amio 200 bid (was on amio 100) - Continue Eliquis 5 mg bid. No bleeding issues.   3. MR - Prominent v-waves in PCWP tracing on RHC 7/22 - No appreciable mumur noted on exam. - Consider TEE to evaluate severity of MR vs diastolic dysfunction.  4. 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  - Per Megan. Jasmine December, no further intervention needed. - follow amio labs   5. H/o Frequent PVCs - Zio patch (8/20) with 5% PVC burden; Zio patch (3 days)  -11/21 with 16% PVC burden. - Zio (5/22) w/ only occasional  PVCs, burden 3.3% on amio + mexilitine. - Continue amio and  mexilitene  6. Left Breast Cancer - Treated w/ XRT.  7. Adenocarcinoma in situ of cervix  - Followed by [facility]. - s/p hysertectomy.   Megan Bickers, MD 08/07/21

## 2021-08-06 NOTE — Progress Notes (Signed)
OK 

## 2021-08-07 ENCOUNTER — Encounter (HOSPITAL_COMMUNITY): Payer: Self-pay | Admitting: Internal Medicine

## 2021-08-07 ENCOUNTER — Other Ambulatory Visit: Payer: Self-pay

## 2021-08-07 ENCOUNTER — Ambulatory Visit (HOSPITAL_COMMUNITY)
Admission: RE | Admit: 2021-08-07 | Discharge: 2021-08-07 | Disposition: A | Discharge status: Home / Self Care | Payer: Self-pay | Source: Ambulatory Visit | Attending: Internal Medicine | Admitting: Internal Medicine

## 2021-08-07 VITALS — BP 90/70 | HR 99 | Wt 146.0 lb

## 2021-08-07 DIAGNOSIS — Z7901 Long term (current) use of anticoagulants: Secondary | ICD-10-CM | POA: Insufficient documentation

## 2021-08-07 DIAGNOSIS — Z90710 Acquired absence of both cervix and uterus: Secondary | ICD-10-CM | POA: Insufficient documentation

## 2021-08-07 DIAGNOSIS — I472 Ventricular tachycardia, unspecified: Secondary | ICD-10-CM | POA: Insufficient documentation

## 2021-08-07 DIAGNOSIS — Z8616 Personal history of COVID-19: Secondary | ICD-10-CM | POA: Insufficient documentation

## 2021-08-07 DIAGNOSIS — I4891 Unspecified atrial fibrillation: Secondary | ICD-10-CM

## 2021-08-07 DIAGNOSIS — I5022 Chronic systolic (congestive) heart failure: Secondary | ICD-10-CM | POA: Insufficient documentation

## 2021-08-07 DIAGNOSIS — Z91199 Patient's noncompliance with other medical treatment and regimen due to unspecified reason: Secondary | ICD-10-CM | POA: Insufficient documentation

## 2021-08-07 DIAGNOSIS — I428 Other cardiomyopathies: Secondary | ICD-10-CM | POA: Insufficient documentation

## 2021-08-07 DIAGNOSIS — Z853 Personal history of malignant neoplasm of breast: Secondary | ICD-10-CM | POA: Insufficient documentation

## 2021-08-07 DIAGNOSIS — Z923 Personal history of irradiation: Secondary | ICD-10-CM | POA: Insufficient documentation

## 2021-08-07 DIAGNOSIS — Z79899 Other long term (current) drug therapy: Secondary | ICD-10-CM | POA: Insufficient documentation

## 2021-08-07 DIAGNOSIS — I471 Supraventricular tachycardia: Secondary | ICD-10-CM | POA: Insufficient documentation

## 2021-08-07 DIAGNOSIS — I959 Hypotension, unspecified: Secondary | ICD-10-CM | POA: Insufficient documentation

## 2021-08-07 DIAGNOSIS — E0590 Thyrotoxicosis, unspecified without thyrotoxic crisis or storm: Secondary | ICD-10-CM | POA: Insufficient documentation

## 2021-08-07 DIAGNOSIS — I493 Ventricular premature depolarization: Secondary | ICD-10-CM | POA: Insufficient documentation

## 2021-08-07 DIAGNOSIS — Z8541 Personal history of malignant neoplasm of cervix uteri: Secondary | ICD-10-CM | POA: Insufficient documentation

## 2021-08-07 DIAGNOSIS — I480 Paroxysmal atrial fibrillation: Secondary | ICD-10-CM | POA: Insufficient documentation

## 2021-08-07 LAB — CBC
HCT: 36.5 % (ref 36.0–46.0)
Hemoglobin: 11.4 g/dL — ABNORMAL LOW (ref 12.0–15.0)
MCH: 27.9 pg (ref 26.0–34.0)
MCHC: 31.2 g/dL (ref 30.0–36.0)
MCV: 89.5 fL (ref 80.0–100.0)
Platelets: 355 10*3/uL (ref 150–400)
RBC: 4.08 MIL/uL (ref 3.87–5.11)
RDW: 17.9 % — ABNORMAL HIGH (ref 11.5–15.5)
WBC: 5.6 10*3/uL (ref 4.0–10.5)
nRBC: 0 % (ref 0.0–0.2)

## 2021-08-07 LAB — COMPREHENSIVE METABOLIC PANEL
ALT: 14 U/L (ref 0–44)
AST: 19 U/L (ref 15–41)
Albumin: 3.6 g/dL (ref 3.5–5.0)
Alkaline Phosphatase: 85 U/L (ref 38–126)
Anion gap: 6 (ref 5–15)
BUN: 10 mg/dL (ref 6–20)
CO2: 23 mmol/L (ref 22–32)
Calcium: 9.4 mg/dL (ref 8.9–10.3)
Chloride: 111 mmol/L (ref 98–111)
Creatinine, Ser: 0.73 mg/dL (ref 0.44–1.00)
GFR, Estimated: >60 mL/min (ref >60)
Glucose, Bld: 110 mg/dL — ABNORMAL HIGH (ref 70–99)
Potassium: 4.3 mmol/L (ref 3.5–5.1)
Sodium: 140 mmol/L (ref 135–145)
Total Bilirubin: 0.7 mg/dL (ref 0.3–1.2)
Total Protein: 6.5 g/dL (ref 6.5–8.1)

## 2021-08-07 LAB — T4, FREE: Free T4: 1.42 ng/dL — ABNORMAL HIGH (ref 0.61–1.12)

## 2021-08-07 LAB — TSH: TSH: 0.323 u[IU]/mL — ABNORMAL LOW (ref 0.350–4.500)

## 2021-08-07 LAB — BRAIN NATRIURETIC PEPTIDE: B Natriuretic Peptide: 1263.6 pg/mL — ABNORMAL HIGH (ref 0.0–100.0)

## 2021-08-07 MED ORDER — AMIODARONE HCL 200 MG PO TABS: 200.0000 mg | ORAL_TABLET | Freq: Two times a day (BID) | ORAL | 5 refills | Status: DC

## 2021-08-07 NOTE — Progress Notes (Signed)
ReDS Vest / Clip - 08/07/21 1100       ReDS Vest / Clip   Station Marker A    Ruler Value 34    ReDS Value Range Low volume    ReDS Actual Value 35

## 2021-08-07 NOTE — Patient Instructions (Addendum)
START Amiodarone 200mg  (1 tab) twice a day  Labs today We will only contact you if something comes back abnormal or we need to make some changes. Otherwise no news is good news!  Your physician has requested that you have an echocardiogram. Echocardiography is a painless test that uses sound waves to create images of your heart. It provides your doctor with information about the size and shape of your heart and how well your hearts chambers and valves are working. This procedure takes approximately one hour. There are no restrictions for this procedure.  Your physician recommends that you schedule a follow-up appointment in: 1 month with the Nurse practitioner/Physician Assistant   Please call office at [phone] option 2 if you have any questions or concerns.   Do the following things EVERYDAY: Weigh yourself in the morning before breakfast. Write it down and keep it in a log. Take your medicines as prescribed Eat low salt foods--Limit salt (sodium) to 2000 mg per day.  Stay as active as you can everyday Limit all fluids for the day to less than 2 liters   At the [facility], you and your health needs are our priority. As part of our continuing mission to provide you with exceptional heart care, we have created designated Provider Care Teams. These Care Teams include your primary Cardiologist (physician) and Advanced Practice Providers (APPs- Physician Assistants and Nurse Practitioners) who all work together to provide you with the care you need, when you need it.   You may see any of the following providers on your designated Care Team at your next follow up: Dr Glori Bickers Dr Haynes Kerns, NP Lyda Jester, [place] [facility] [place], [place] Audry Riles, PharmD   Please be sure to bring in all your medications bottles to every appointment.

## 2021-09-01 ENCOUNTER — Other Ambulatory Visit (HOSPITAL_COMMUNITY): Payer: Self-pay | Admitting: *Deleted

## 2021-09-01 MED ORDER — FUROSEMIDE 40 MG PO TABS: 40.0000 mg | ORAL_TABLET | Freq: Every day | ORAL | 6 refills | Status: DC

## 2021-09-02 ENCOUNTER — Other Ambulatory Visit (HOSPITAL_COMMUNITY): Payer: Self-pay | Admitting: Internal Medicine

## 2021-09-02 ENCOUNTER — Telehealth: Payer: Self-pay

## 2021-09-02 ENCOUNTER — Other Ambulatory Visit (HOSPITAL_COMMUNITY): Payer: Self-pay | Admitting: *Deleted

## 2021-09-02 MED ORDER — FUROSEMIDE 40 MG PO TABS: 40.0000 mg | ORAL_TABLET | Freq: Every day | ORAL | 3 refills | Status: DC

## 2021-09-02 NOTE — Telephone Encounter (Signed)
I spoke with pt's granddaughter,[name]. She states her grandmother wanted to make sure it was ok for her to go back to her regular activities? I told her Yes, she can do whatever she feels up to doing. She replied, "Ok, she wants to go to the [employer] Confirmed next appt November 19, 2021 w/Dr Bobby Rumpf Next radiation appt is 09/10/2021 Dr Orlene Erm

## 2021-09-04 ENCOUNTER — Other Ambulatory Visit: Payer: Self-pay

## 2021-09-18 ENCOUNTER — Other Ambulatory Visit (INDEPENDENT_AMBULATORY_CARE_PROVIDER_SITE_OTHER): Payer: Self-pay

## 2021-09-18 ENCOUNTER — Other Ambulatory Visit: Payer: Self-pay

## 2021-09-18 DIAGNOSIS — E064 Drug-induced thyroiditis: Secondary | ICD-10-CM

## 2021-09-18 DIAGNOSIS — T462X5A Adverse effect of other antidysrhythmic drugs, initial encounter: Secondary | ICD-10-CM

## 2021-09-18 LAB — T4, FREE: Free T4: 1.32 ng/dL (ref 0.60–1.60)

## 2021-09-18 LAB — TSH: TSH: 0.37 u[IU]/mL (ref 0.35–5.50)

## 2021-09-18 LAB — T3, FREE: T3, Free: 3.5 pg/mL (ref 2.3–4.2)

## 2021-09-22 NOTE — Progress Notes (Signed)
Advanced Heart Failure Clinic Note   PCP: Pcp, No HF Cardiologist: Glori Bickers, MD  EP: Dr. Rayann Heman   Reason for Visit: f/u for Chronic Systolic Heart Failure   HPI: Megan Keith is a 61 y.o.female with chronic systolic CHF (Echo EF 35-36% in 03/2017)  PAF, and hyperthyroidisum.     Admitted to [facility] 8/18 with ADHF. Prior EF with Dr. Bettina Keith was 35%. Repeat echo EF 15%. Transferred to [facility]. R/LHC. no CAD. Fick output/index 5.3/3.3. Noted to have >20% PVC on telemetry. Started on amiodarone which she susequently stopped.   Echo 11/19 25-30%   Admitted 7/20 with ADHF with new-onset AF in the setting of hyperthyroidism. TEE with EF 10% RV ok. Started on amiodarone and diuresed. Underwent TEE/DC-CV and felt much better. Also treated with methimazole and prednisone.   Zio patch placed to monitor PVC burden.    Zio patch 8/20 - NSR avg HR 75 - 13 runs NSVT longest 14 beats - 59 runs SVT longest 9.4 seconds - PVC burden 4.8%   Admitted 10/20 for COVID 19 infection c/b recurrent afib w/ RVR. Was loaded on IV amiodarone and rate improved but did not convert back to NSR. Was placed on Eliquis and discharged home on PO digoxin and metoprolol.   Admitted from HF clinic 11/20 with A Fib RVR -> ADHF.Methimazole and prednisone continued for hyperthyroidism.  EP was consulted. Initial plans were for PVI ablation but procedure canceled after TEE showed severely reduced EF at 5% and severe LAE. Had DC-CV with conversion to NSR.  She was also treated w/ Losartan (BP too soft for Entesto), spironolactone and digoxin.   Saw Dr. Rayann Heman on 10/22/19 and was still in NSR. Discussed possible AF ablation. There was concern over degree of MR and LAE.   She finished treatment for hyperthyroidism with Dr. Renne Keith 5/21.    Admitted [facility] Megan Keith 9/21 She was reportedly treated for mild hemoptysis in setting of PNA and CHF. Treated w/ abx and lasix. Had low BP and was taken off losartan and  Coreg.    11/21 High PVC burden so Mexitil 200 mg bid started.   Repeat Echo (4/22) EF 20-25%   Zio (5/22) (on mexiletine). Occasional PVCs, burden low at 3.3%. Also had 32 runs of SVT, the longest lasting 10 beats w/ average HR 104 bpm.   Diagnosed w/ cervical cancer 7/22.  Underwent RHC 03/12/21 after having increased fatigue/decreased exercise tolerance; concern for low output. Found to have markedly elevated filling pressures and preserved CO (see below). She was given metolazone 2.5 x 2 days and lasix increased to 60 mg daily.  S/p total hysertectomy 05/08/21, tolerated well. Lasix was held with low BP with instructions to restart when sBP>100.  Found to be in ectopic AT, amiodarone restarted 1/23.  Today she returns for HF follow up with interpretor. She has occasional stabbing sensations to left chest x 1 month, not bothersome. Episodes resolve spontanously. She feels palpitations occasionally. Breathing is better, no longer has orthopnea. She remains fatigued. She just finished radiation. Denies abnormal bleeding, dizziness, edema, or PND/Orthopnea. Appetite ok. No fever or chills. Weight at home 140 pounds. Taking all medications.   Echo today, results pending.  Cardiac Studies   Echo (8/18): EF 10-15% Echo (12/18): EF 25-30%  Echo (4/19): EF 25-30% Echo (11/19): EF ~30% (per Dr Haroldine Laws) Echo (8/20): EF 25-30%  TEE (11/20): EF 5-10% Echo (2/21): EF 30-35% moderate MR Echo (5/21): EF 25-30%. RV normal.  Echo (11/21): EF 20-25% RV  normal.  - RHC (7/22): Elevated filling pressures w/ preserved CO; prominent v-waves suggesting significant MR vs. diastolic dysfunction.   RA = 3 RV = 57/5 PA = 58/28 (43) PCW = 30 (v = 45) Fick cardiac output/index = 4.6/2.8 PVR = 2.8 WU FA sat = 95% PA sat = 67%, 69% SVC = 74%   Assessment: Markedly elevated filling pressures with preserved cardiac output. Prominent v-waves in the PCWP tracing suggestive of significant MR vs diastolic  dysfunction   R/LHC (8/18): normal cors  Right Heart RA (mean): 5 mmHg RV (S/EDP): 52/4 mmHg PA (S/D, mean): 52/20 (31) mmHg PCWP (mean): 30 mmHg Ao sat: 96% PA sat: 71% RA sat: 74% Fick CO: 5.3 L/min Fick CI: 3.3 L/min/m^2 SVR: 1,101 dynscm-5  - Zio patch (8/20): - NSR avg HR 75 - 13 runs NSVT longest 14 beats - 59 runs SVT longest 9.4 seconds - PVC burden 4.8%  - Zio Patch (5/22): 1. Sinus rhythm - avg HR of 79 bpm. 2. 32 runs of SVT - the longest lasting 10 beats with an avg rate of 104 bpm 3. 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 ? PVC cardiomyopathy. right and left heart catheterization on 04/15/17, no CAD. Fick output/index 5.28/3.34.   Noncompliance    a. episodic noncompliance.   Pneumonia 06/2016   Current Outpatient Medications  Medication Sig Dispense Refill   acetaminophen (TYLENOL) 325 MG tablet Take 325-650 mg by mouth every 6 (six) hours as needed (for pain.).     amiodarone (PACERONE) 200 MG tablet Take 1 tablet (200 mg total) by mouth 2 (two) times daily. 60 tablet 5   anastrozole (ARIMIDEX) 1 MG tablet TAKE 1 TABLET BY MOUTH IN THE MORNING. PATIENT TAKES IT AT NIGHT. 90 tablet 0   apixaban (ELIQUIS) 5 MG TABS tablet Take 1 tablet (5 mg total) by mouth 2 (two) times daily. 60 tablet 3   carvedilol (COREG) 3.125 MG tablet TAKE 1 Tablet  BY MOUTH TWICE DAILY 60 tablet 6   furosemide (LASIX) 40 MG tablet Take 1 tablet (40 mg total) by mouth daily. 90 tablet 3    hydrOXYzine (VISTARIL) 25 MG capsule Take 1 capsule (25 mg total) by mouth 3 (three) times daily as needed. 30 capsule 0   mexiletine (MEXITIL) 200 MG capsule Take 1 capsule (200 mg total) by mouth 2 (two) times daily. 60 capsule 6   omeprazole (PRILOSEC) 20 MG capsule Take 20 mg by mouth in the morning.     spironolactone (ALDACTONE) 25 MG tablet Take 1 tablet (25 mg total) by mouth daily. 30 tablet 6   No current facility-administered medications for this encounter.   No Known Allergies  Social History   Socioeconomic History   Marital status: Single    Spouse name: Not on file   Number of children: Not on file   Years of education: Not on file  Highest education level: Not on file  Occupational History   Not on file  Tobacco Use   Smoking status: Never   Smokeless tobacco: Never  Vaping Use   Vaping Use: Never used  Substance and Sexual Activity   Alcohol use: Not Currently    Comment: 04/15/2017 "might have a couple beers q couple months"   Drug use: Not Currently   Sexual activity: Not Currently  Other Topics Concern   Not on file  Social History Narrative   ** Merged History Encounter **       N/A   Social Determinants of Health   Financial Resource Strain: Not on file  Food Insecurity: Not on file  Transportation Needs: Not on file  Physical Activity: Not on file  Stress: Not on file  Social Connections: Not on file  Intimate Partner Violence: Not on file   Family History  Problem Relation Age of Onset   Hypertension Mother    BP 90/66    Pulse 86    Wt 63.2 kg (139 lb 6.4 oz)    SpO2 99%    BMI 27.22 kg/m   Wt Readings from Last 3 Encounters:  09/23/21 63.2 kg (139 lb 6.4 oz)  08/07/21 66.2 kg (146 lb)  08/04/21 66.1 kg (145 lb 12.8 oz)   PHYSICAL EXAM: General:  NAD. No resp difficulty HEENT: Normal Neck: Supple. No JVD. Carotids 2+ bilat; no bruits. No lymphadenopathy or thryomegaly appreciated. Cor: PMI nondisplaced. Regular rate & rhythm. No  rubs, gallops or murmurs. Lungs: Clear Abdomen: Soft, nontender, nondistended. No hepatosplenomegaly. No bruits or masses. Good bowel sounds. Extremities: No cyanosis, clubbing, rash, edema Neuro: Alert & oriented x 3, cranial nerves grossly intact. Moves all 4 extremities w/o difficulty. Affect pleasant.  ECG: SR with occasional PVCs 92 bpm (personally reviewed).  ASSESSMENT & PLAN: 1. Chronic systolic HF: NICM, normal cors in 03/2017. 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  - Echo (03/2017):  EF 15-20%  - Echo (06/2018): EF ~30% - TEE (11/20): EF 5% in setting of AF - Echo (2/21): EF 30-35% - Echo (07/08/20): EF 20-25% RV ok - Echo (4/22): 20-25%  - RHC (7/22): markedly elevated filling pressures and preserved CO, prominent v-waves - Better NYHA II, volume looks good today, weight down 7 lbs. GDMT limited by low BP. - Continue Lasix 40 mg daily. - Continue Coreg 3.125 mg bid. - Continue spiro 25 mg daily.  - Intolerant to [place].  - BP has been too low for ARB/ARNI.  - Have not proceeded with ICD given non-compliance and hope that EF would improve with rhythm management.  - Echo today, results pending. - Recent labs ok.  2. PAF/ectopic atrial tachycardia - S/p previous DC-CV in 7/20. 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   - Previously was on amio for PVCs but developed hyperthyroidism, treated w/ methimazole.  - 06/2019 recurrent AF w/ RVR. Loaded w/amio. Evaluated by EP. Initial plans were for PVI ablation but cancelled after TEE showed EF 5% and severe LAE. Pt underwent DCCV instead.   - Saw Dr. Rayann Heman again in 2/21. Planned for AF ablation but does not have insurance coverage for AF ablation.  - Continue medical treatment. No good option. Will engage HFSW. - SR on ECG today, few PVCs. - Decrease amiodarone to 200 mg daily. Discussed with Dr. Haroldine Laws. - Continue Eliquis 5 mg bid. No bleeding issues.   3. MR - Prominent v-waves in PCWP tracing on RHC 7/22. - No appreciable  mumur noted on  exam. - Echo today  4. 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  - Per Dr. Jasmine December, no further intervention needed. - Follow amio labs. TSH and LFTs ok 12/22.  5. H/o Frequent PVCs - Zio patch (8/20) with 5% PVC burden; Zio patch (3 days)  -11/21 with 16% PVC burden. - Zio (5/22) w/ only occasional PVCs, burden 3.3% on amio + mexilitine. - Reduce amio as above. Discussed with Dr. Haroldine Laws. - Continue mexilitene.  6. Left Breast Cancer - Treated w/ XRT.  7. Adenocarcinoma in situ of cervix  - Followed by [facility]. - s/p hysertectomy.  Follow up in 2 - 3 months with Dr. Haroldine Laws.  [place], FNP 09/23/21

## 2021-09-23 ENCOUNTER — Other Ambulatory Visit: Payer: Self-pay

## 2021-09-23 ENCOUNTER — Encounter (HOSPITAL_COMMUNITY): Payer: Self-pay

## 2021-09-23 ENCOUNTER — Ambulatory Visit (HOSPITAL_BASED_OUTPATIENT_CLINIC_OR_DEPARTMENT_OTHER)
Admission: RE | Admit: 2021-09-23 | Discharge: 2021-09-23 | Disposition: A | Discharge status: Home / Self Care | Payer: Self-pay | Source: Ambulatory Visit

## 2021-09-23 ENCOUNTER — Ambulatory Visit (HOSPITAL_COMMUNITY)
Admission: RE | Admit: 2021-09-23 | Discharge: 2021-09-23 | Disposition: A | Discharge status: Home / Self Care | Payer: Self-pay | Source: Ambulatory Visit | Attending: Family Medicine | Admitting: Family Medicine

## 2021-09-23 VITALS — BP 90/66 | HR 86 | Wt 139.4 lb

## 2021-09-23 DIAGNOSIS — Z8701 Personal history of pneumonia (recurrent): Secondary | ICD-10-CM | POA: Insufficient documentation

## 2021-09-23 DIAGNOSIS — Z8249 Family history of ischemic heart disease and other diseases of the circulatory system: Secondary | ICD-10-CM | POA: Insufficient documentation

## 2021-09-23 DIAGNOSIS — I480 Paroxysmal atrial fibrillation: Secondary | ICD-10-CM | POA: Insufficient documentation

## 2021-09-23 DIAGNOSIS — Z7901 Long term (current) use of anticoagulants: Secondary | ICD-10-CM | POA: Insufficient documentation

## 2021-09-23 DIAGNOSIS — I471 Supraventricular tachycardia: Secondary | ICD-10-CM | POA: Insufficient documentation

## 2021-09-23 DIAGNOSIS — Z853 Personal history of malignant neoplasm of breast: Secondary | ICD-10-CM

## 2021-09-23 DIAGNOSIS — I959 Hypotension, unspecified: Secondary | ICD-10-CM | POA: Insufficient documentation

## 2021-09-23 DIAGNOSIS — I4891 Unspecified atrial fibrillation: Secondary | ICD-10-CM

## 2021-09-23 DIAGNOSIS — I340 Nonrheumatic mitral (valve) insufficiency: Secondary | ICD-10-CM

## 2021-09-23 DIAGNOSIS — C539 Malignant neoplasm of cervix uteri, unspecified: Secondary | ICD-10-CM | POA: Insufficient documentation

## 2021-09-23 DIAGNOSIS — Z8616 Personal history of COVID-19: Secondary | ICD-10-CM | POA: Insufficient documentation

## 2021-09-23 DIAGNOSIS — I491 Atrial premature depolarization: Secondary | ICD-10-CM | POA: Insufficient documentation

## 2021-09-23 DIAGNOSIS — I5022 Chronic systolic (congestive) heart failure: Secondary | ICD-10-CM

## 2021-09-23 DIAGNOSIS — I081 Rheumatic disorders of both mitral and tricuspid valves: Secondary | ICD-10-CM | POA: Insufficient documentation

## 2021-09-23 DIAGNOSIS — E0590 Thyrotoxicosis, unspecified without thyrotoxic crisis or storm: Secondary | ICD-10-CM

## 2021-09-23 DIAGNOSIS — Z923 Personal history of irradiation: Secondary | ICD-10-CM | POA: Insufficient documentation

## 2021-09-23 DIAGNOSIS — I493 Ventricular premature depolarization: Secondary | ICD-10-CM

## 2021-09-23 DIAGNOSIS — Z90710 Acquired absence of both cervix and uterus: Secondary | ICD-10-CM | POA: Insufficient documentation

## 2021-09-23 DIAGNOSIS — Z79899 Other long term (current) drug therapy: Secondary | ICD-10-CM | POA: Insufficient documentation

## 2021-09-23 DIAGNOSIS — I428 Other cardiomyopathies: Secondary | ICD-10-CM | POA: Insufficient documentation

## 2021-09-23 DIAGNOSIS — I482 Chronic atrial fibrillation, unspecified: Secondary | ICD-10-CM | POA: Insufficient documentation

## 2021-09-23 LAB — ECHOCARDIOGRAM COMPLETE
Area-P 1/2: 5.27 cm2
MV M vel: 3.66 m/s
MV Peak grad: 53.6 mmHg
Radius: 0.5 cm
S' Lateral: 5 cm

## 2021-09-23 MED ORDER — AMIODARONE HCL 200 MG PO TABS: 200.0000 mg | ORAL_TABLET | Freq: Every day | ORAL | 5 refills | Status: DC

## 2021-09-23 NOTE — Patient Instructions (Signed)
Decrease Amiodarone to 200mg  (1 Tab)  Daily  Your physician recommends that you schedule a follow-up appointment in: 3 months  If you have any questions or concerns before your next appointment please send [place] a message through [place] or call our office at [phone].    TO LEAVE A MESSAGE FOR THE NURSE SELECT OPTION 2, PLEASE LEAVE A MESSAGE INCLUDING: YOUR NAME DATE OF BIRTH CALL BACK NUMBER REASON FOR CALL**this is important as we prioritize the call backs  YOU WILL RECEIVE A CALL BACK THE SAME DAY AS LONG AS YOU CALL BEFORE 4:00 PM  At the [facility], you and your health needs are our priority. As part of our continuing mission to provide you with exceptional heart care, we have created designated Provider Care Teams. These Care Teams include your primary Cardiologist (physician) and Advanced Practice Providers (APPs- Physician Assistants and Nurse Practitioners) who all work together to provide you with the care you need, when you need it.   You may see any of the following providers on your designated Care Team at your next follow up: Dr Glori Bickers Dr Haynes Kerns, NP Lyda Jester, [place] [facility] [place], [place] Audry Riles, PharmD   Please be sure to bring in all your medications bottles to every appointment.

## 2021-10-16 ENCOUNTER — Telehealth (HOSPITAL_COMMUNITY): Payer: Self-pay | Admitting: *Deleted

## 2021-10-16 NOTE — Telephone Encounter (Signed)
Pts granddaughter called stating pt is short of breath, very weak, and c/o chest pain that comes and goes.  Pts granddaughter said pt stopped her amiodarone and eliquis Friday 10/09/21 and restarted Wednesday 10/14/21 because she wanted to have some alcohol for her birthday.   Routed to [employer] for advice

## 2021-10-16 NOTE — Telephone Encounter (Signed)
Pts granddaughter advised.

## 2021-10-27 ENCOUNTER — Encounter: Payer: Self-pay | Admitting: Oncology

## 2021-10-27 ENCOUNTER — Other Ambulatory Visit (HOSPITAL_COMMUNITY): Payer: Self-pay

## 2021-10-27 ENCOUNTER — Ambulatory Visit (HOSPITAL_COMMUNITY)
Admission: RE | Admit: 2021-10-27 | Discharge: 2021-10-27 | Disposition: A | Discharge status: Home / Self Care | Payer: Self-pay | Source: Ambulatory Visit | Attending: Family Medicine | Admitting: Family Medicine

## 2021-10-27 ENCOUNTER — Encounter (HOSPITAL_COMMUNITY): Payer: Self-pay

## 2021-10-27 ENCOUNTER — Other Ambulatory Visit: Payer: Self-pay

## 2021-10-27 VITALS — BP 88/56 | HR 82 | Wt 143.2 lb

## 2021-10-27 DIAGNOSIS — I428 Other cardiomyopathies: Secondary | ICD-10-CM | POA: Insufficient documentation

## 2021-10-27 DIAGNOSIS — R0789 Other chest pain: Secondary | ICD-10-CM | POA: Insufficient documentation

## 2021-10-27 DIAGNOSIS — I480 Paroxysmal atrial fibrillation: Secondary | ICD-10-CM | POA: Insufficient documentation

## 2021-10-27 DIAGNOSIS — I493 Ventricular premature depolarization: Secondary | ICD-10-CM

## 2021-10-27 DIAGNOSIS — R0602 Shortness of breath: Secondary | ICD-10-CM | POA: Insufficient documentation

## 2021-10-27 DIAGNOSIS — I340 Nonrheumatic mitral (valve) insufficiency: Secondary | ICD-10-CM

## 2021-10-27 DIAGNOSIS — I5022 Chronic systolic (congestive) heart failure: Secondary | ICD-10-CM | POA: Insufficient documentation

## 2021-10-27 DIAGNOSIS — Z853 Personal history of malignant neoplasm of breast: Secondary | ICD-10-CM | POA: Insufficient documentation

## 2021-10-27 DIAGNOSIS — Z923 Personal history of irradiation: Secondary | ICD-10-CM | POA: Insufficient documentation

## 2021-10-27 DIAGNOSIS — Z7901 Long term (current) use of anticoagulants: Secondary | ICD-10-CM | POA: Insufficient documentation

## 2021-10-27 DIAGNOSIS — Z8541 Personal history of malignant neoplasm of cervix uteri: Secondary | ICD-10-CM | POA: Insufficient documentation

## 2021-10-27 DIAGNOSIS — E0590 Thyrotoxicosis, unspecified without thyrotoxic crisis or storm: Secondary | ICD-10-CM | POA: Insufficient documentation

## 2021-10-27 DIAGNOSIS — I471 Supraventricular tachycardia: Secondary | ICD-10-CM | POA: Insufficient documentation

## 2021-10-27 DIAGNOSIS — Z79899 Other long term (current) drug therapy: Secondary | ICD-10-CM | POA: Insufficient documentation

## 2021-10-27 DIAGNOSIS — Z8616 Personal history of COVID-19: Secondary | ICD-10-CM | POA: Insufficient documentation

## 2021-10-27 DIAGNOSIS — C539 Malignant neoplasm of cervix uteri, unspecified: Secondary | ICD-10-CM

## 2021-10-27 LAB — BRAIN NATRIURETIC PEPTIDE: B Natriuretic Peptide: 1501.2 pg/mL — ABNORMAL HIGH (ref 0.0–100.0)

## 2021-10-27 LAB — BASIC METABOLIC PANEL
Anion gap: 5 (ref 5–15)
BUN: 12 mg/dL (ref 8–23)
CO2: 24 mmol/L (ref 22–32)
Calcium: 9 mg/dL (ref 8.9–10.3)
Chloride: 110 mmol/L (ref 98–111)
Creatinine, Ser: 0.73 mg/dL (ref 0.44–1.00)
GFR, Estimated: >60 mL/min (ref >60)
Glucose, Bld: 107 mg/dL — ABNORMAL HIGH (ref 70–99)
Potassium: 4.4 mmol/L (ref 3.5–5.1)
Sodium: 139 mmol/L (ref 135–145)

## 2021-10-27 MED ORDER — TORSEMIDE 20 MG PO TABS
40.0000 mg | ORAL_TABLET | Freq: Every day | ORAL | 4 refills | Status: DC
  Filled 2021-10-27: qty 60, 30d supply, fill #0

## 2021-10-27 NOTE — Addendum Note (Signed)
Encounter addended by: Jorge Ny, LCSW on: 10/27/2021 4:37 PM ? Actions taken: Clinical Note Signed

## 2021-10-27 NOTE — Progress Notes (Signed)
ReDS Vest / Clip - 10/27/21 1200   ? ?  ? ReDS Vest / Clip  ? Station Marker A   ? Ruler Value 35   ? ReDS Value Range Moderate volume overload   ? ReDS Actual Value 37   ? ?  ?  ? ?  ? ? ?

## 2021-10-27 NOTE — Progress Notes (Signed)
Advanced Heart Failure Clinic Note   PCP: Pcp, No HF Cardiologist: Glori Bickers, MD  EP: Dr. Rayann Heman   Reason for Visit: f/u for Chronic Systolic Heart Failure   HPI: Megan Keith is a 61 y.o.female with chronic systolic CHF (Echo EF 16-10% in 03/2017)  PAF, and hyperthyroidisum.     Admitted to [facility] 8/18 with ADHF. Prior EF with Dr. [name] was 35%. Repeat echo EF 15%. Transferred to [facility]. R/LHC. no CAD. Fick output/index 5.3/3.3. Noted to have >20% PVC on telemetry. Started on amiodarone which she susequently stopped.   Echo 11/19 25-30%   Admitted 7/20 with ADHF with new-onset AF in the setting of hyperthyroidism. TEE with EF 10% RV ok. Started on amiodarone and diuresed. Underwent TEE/DC-CV and felt much better. Also treated with methimazole and prednisone.   Zio patch placed to monitor PVC burden.    Zio patch 8/20 - NSR avg HR 75 - 13 runs NSVT longest 14 beats - 59 runs SVT longest 9.4 seconds - PVC burden 4.8%   Admitted 10/20 for COVID 19 infection c/b recurrent afib w/ RVR. Was loaded on IV amiodarone and rate improved but did not convert back to NSR. Was placed on Eliquis and discharged home on PO digoxin and metoprolol.   Admitted from HF clinic 11/20 with A Fib RVR -> ADHF.Methimazole and prednisone continued for hyperthyroidism.  EP was consulted. Initial plans were for PVI ablation but procedure canceled after TEE showed severely reduced EF at 5% and severe LAE. Had DC-CV with conversion to NSR.  She was also treated w/ Losartan (BP too soft for Entesto), spironolactone and digoxin.   Saw Dr. Rayann Heman on 10/22/19 and was still in NSR. Discussed possible AF ablation. There was concern over degree of MR and LAE.   She finished treatment for hyperthyroidism with Dr. [name] 5/21.    Admitted [name] 9/21 She was reportedly treated for mild hemoptysis in setting of PNA and CHF. Treated w/ abx and lasix. Had low BP and was taken off losartan and  Coreg.    11/21 High PVC burden so Mexitil 200 mg bid started.   Repeat Echo (4/22) EF 20-25%   Zio (5/22) (on mexiletine). Occasional PVCs, burden low at 3.3%. Also had 32 runs of SVT, the longest lasting 10 beats w/ average HR 104 bpm.   Diagnosed w/ cervical cancer 7/22.  Underwent RHC 03/12/21 after having increased fatigue/decreased exercise tolerance; concern for low output. Found to have markedly elevated filling pressures and preserved CO (see below). She was given metolazone 2.5 x 2 days and lasix increased to 60 mg daily.  S/p total hysertectomy 05/08/21, tolerated well. Lasix was held with low BP with instructions to restart when sBP>100.  Found to be in ectopic AT, amiodarone restarted 1/23. Follow up 2/23 breathing improved and weight was down. Amiodarone reduced.  Echo 2/23 showed EF 20%, severe LV dysfunction, grade III DD, RV moderately reduced, moderate MR, TR moderate to severe  Today she presents for an acute visit with her grand-daughter and interpretor. She is having more SOB with minimal exertion and chest tightness x 2-3 weeks. Grand-daughter says she has new orthopnea and dizziness. She is not urinating as much with current dose of Lasix. Remains generally fatigued. Denies abnormal bleeding or dizziness. Appetite ok. No fever or chills. Weight at home 137-138 pounds. Taking all medications, grand-daughter helps with these.   Cardiac Studies   Echo (8/18): EF 10-15% Echo (12/18): EF 25-30%  Echo (4/19): EF 25-30%  Echo (11/19): EF ~30% (per Dr Haroldine Laws) Echo (8/20): EF 25-30%  TEE (11/20): EF 5-10% Echo (2/21): EF 30-35% moderate MR Echo (5/21): EF 25-30%. RV normal.  Echo (11/21): EF 20-25% RV normal.  - RHC (7/22): Elevated filling pressures w/ preserved CO; prominent v-waves suggesting significant MR vs. diastolic dysfunction.   RA = 3 RV = 57/5 PA = 58/28 (43) PCW = 30 (v = 45) Fick cardiac output/index = 4.6/2.8 PVR = 2.8 WU FA sat = 95% PA sat = 67%,  69% SVC = 74%   Assessment: Markedly elevated filling pressures with preserved cardiac output. Prominent v-waves in the PCWP tracing suggestive of significant MR vs diastolic dysfunction   R/LHC (8/18): normal cors  Right Heart RA (mean): 5 mmHg RV (S/EDP): 52/4 mmHg PA (S/D, mean): 52/20 (31) mmHg PCWP (mean): 30 mmHg Ao sat: 96% PA sat: 71% RA sat: 74% Fick CO: 5.3 L/min Fick CI: 3.3 L/min/m^2 SVR: 1,101 dynscm-5  - Zio patch (8/20): - NSR avg HR 75 - 13 runs NSVT longest 14 beats - 59 runs SVT longest 9.4 seconds - PVC burden 4.8%  - Zio Patch (5/22): 1. Sinus rhythm - avg HR of 79 bpm. 2. 32 runs of SVT - the longest lasting 10 beats with an avg rate of 104 bpm 3. 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 ? PVC cardiomyopathy. right and left heart catheterization on 04/15/17, no CAD. Fick output/index 5.28/3.34.   Noncompliance    a. episodic noncompliance.   Pneumonia 06/2016   Current Outpatient Medications  Medication Sig Dispense Refill   acetaminophen (TYLENOL) 325 MG tablet Take 325-650 mg by mouth every 6 (six) hours as needed (for pain.).     amiodarone (PACERONE) 200 MG tablet Take 200 mg by mouth 2 (two) times daily.     anastrozole (ARIMIDEX) 1 MG tablet TAKE 1 TABLET BY MOUTH IN THE MORNING. PATIENT TAKES IT AT NIGHT. 90 tablet 0   apixaban (ELIQUIS) 5 MG TABS tablet Take 1 tablet (5 mg total) by mouth 2 (two) times daily. 60 tablet 3   carvedilol (COREG) 3.125 MG  tablet TAKE 1 Tablet  BY MOUTH TWICE DAILY 60 tablet 6   furosemide (LASIX) 40 MG tablet Take 1 tablet (40 mg total) by mouth daily. 90 tablet 3   mexiletine (MEXITIL) 200 MG capsule Take 1 capsule (200 mg total) by mouth 2 (two) times daily. 60 capsule 6   omeprazole (PRILOSEC) 20 MG capsule Take 20 mg by mouth in the morning.     spironolactone (ALDACTONE) 25 MG tablet Take 1 tablet (25 mg total) by mouth daily. 30 tablet 6   No current facility-administered medications for this encounter.   No Known Allergies  Social History   Socioeconomic History   Marital status: Single    Spouse name: Not on file   Number of children: Not on file   Years  of education: Not on file   Highest education level: Not on file  Occupational History   Not on file  Tobacco Use   Smoking status: Never   Smokeless tobacco: Never  Vaping Use   Vaping Use: Never used  Substance and Sexual Activity   Alcohol use: Not Currently    Comment: 04/15/2017 "might have a couple beers q couple months"   Drug use: Not Currently   Sexual activity: Not Currently  Other Topics Concern   Not on file  Social History Narrative   ** Merged History Encounter **       N/A   Social Determinants of Health   Financial Resource Strain: Not on file  Food Insecurity: Not on file  Transportation Needs: Not on file  Physical Activity: Not on file  Stress: Not on file  Social Connections: Not on file  Intimate Partner Violence: Not on file   Family History  Problem Relation Age of Onset   Hypertension Mother    BP (!) 88/56    Pulse 82    Wt 65 kg (143 lb 3.2 oz)    SpO2 100%    BMI 27.97 kg/m   Wt Readings from Last 3 Encounters:  10/27/21 65 kg (143 lb 3.2 oz)  09/23/21 63.2 kg (139 lb 6.4 oz)  08/07/21 66.2 kg (146 lb)   PHYSICAL EXAM: General:  NAD. No resp difficulty, fatigued-appearing HEENT: Normal Neck: Supple. JVP 7-8. Carotids 2+ bilat; no bruits. No lymphadenopathy or thryomegaly appreciated. Cor:  PMI nondisplaced. Regular rate & rhythm. No rubs, gallops or murmurs. Lungs: Clear Abdomen: Soft, nontender, nondistended. No hepatosplenomegaly. No bruits or masses. Good bowel sounds. Extremities: No cyanosis, clubbing, rash, edema; warm extremities Neuro: Alert & oriented x 3, cranial nerves grossly intact. Moves all 4 extremities w/o difficulty. Affect pleasant.  ECG: SR 81 bpm (personally reviewed).  REDs: 37%  ASSESSMENT & PLAN: 1. Acute on chronic systolic HF: NICM, normal cors in 03/2017. 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  - Echo (03/2017):  EF 15-20%  - Echo (06/2018): EF ~30% - TEE (11/20): EF 5% in setting of AF - Echo (2/21): EF 30-35% - Echo (07/08/20): EF 20-25% RV ok - Echo (4/22): 20-25%  - RHC (7/22): markedly elevated filling pressures and preserved CO, prominent v-waves - Echo (2/23): EF 20%, severe LV dysfunction, grade III DD, RV moderately reduced, moderate MR, TR moderate to severe - Worse NYHA III-IIIb, volume up today. GDMT limited by low BP. - Stop Lasix. - With low BP, stop spiro for now. - Start torsemide 40 mg daily. - Continue Lasix 40 mg daily. - Continue Coreg 3.125 mg bid. - Intolerant to [place].  - BP has been too low for ARB/ARNI.  - Have not proceeded with ICD given non-compliance and hope that EF would improve with rhythm management, unfortunately with no insurance unable to get ICD.  - Labs today.  2. PAF/ectopic atrial tachycardia - S/p previous DC-CV in 7/20. 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   - Previously was on amio for PVCs but developed hyperthyroidism, treated w/ methimazole.  - 06/2019 recurrent AF w/ RVR. Loaded w/amio. Evaluated by EP. Initial plans were for PVI ablation but cancelled after TEE showed EF 5% and severe LAE. Pt underwent DCCV instead.   - Saw Dr. Rayann Heman again in 2/21. Planned for AF ablation but does not have insurance coverage for AF ablation.  - Continue medical treatment. No good option. HFSW helping with  resources. - SR on ECG today,  no ectopy. - Continue amiodarone 200 mg daily. Consider cutting back next visit. - Continue Eliquis 5 mg bid. No bleeding issues.   3. MR - Prominent v-waves in PCWP tracing on RHC 7/22. - No appreciable mumur noted on exam. - Moderate on echo 2/23.  4. 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  - Per Dr. Jasmine December, no further intervention needed. - Follow amio labs. TSH and LFTs ok 1/23.  5. H/o Frequent PVCs - Zio patch (8/20) with 5% PVC burden; Zio patch (3 days)  -11/21 with 16% PVC burden. - Zio (5/22) w/ only occasional PVCs, burden 3.3% on amio + mexilitine. - NSR on ECG today, no ectopy. - Continue amiodarone 200 mg daily - Continue mexilitene.  6. Left Breast Cancer - Treated w/ XRT.  7. Adenocarcinoma in situ of cervix  - Followed by [facility]. - s/p hysertectomy.  Follow up in a week or so with APP to assess fluid status (ReDs and BMET) and keep follow up with Dr. Haroldine Laws as scheduled.  [place], FNP 10/27/21

## 2021-10-27 NOTE — Patient Instructions (Signed)
Thank you for coming in today ? ?Labs were done today, if any labs are abnormal the clinic will call you ? ?STOP Spirolactone ? ?STOP Lasix ? ?START Torsemide 40 mg 2 tablets daily  ? ?Your physician recommends that you schedule a follow-up appointment in: 1-2 weeks ? ?At the [facility], you and your health needs are our priority. As part of our continuing mission to provide you with exceptional heart care, we have created designated Provider Care Teams. These Care Teams include your primary Cardiologist (physician) and Advanced Practice Providers (APPs- Physician Assistants and Nurse Practitioners) who all work together to provide you with the care you need, when you need it.  ? ?You may see any of the following providers on your designated Care Team at your next follow up: ?Dr Glori Bickers ?Dr Loralie Champagne ?Darrick Grinder, NP ?Lyda Jester, PA ?Jessica Milford,NP ?Marlyce Huge, PA ?Audry Riles, PharmD ? ? ?Please be sure to bring in all your medications bottles to every appointment.  ? ?If you have any questions or concerns before your next appointment please send [place] a message through [place] or call our office at [phone].   ? ?TO LEAVE A MESSAGE FOR THE NURSE SELECT OPTION 2, PLEASE LEAVE A MESSAGE INCLUDING: ?YOUR NAME ?DATE OF BIRTH ?CALL BACK NUMBER ?REASON FOR CALL**this is important as we prioritize the call backs ? ?YOU WILL RECEIVE A CALL BACK THE SAME DAY AS LONG AS YOU CALL BEFORE 4:00 PM ? ?

## 2021-10-27 NOTE — Progress Notes (Signed)
CSW consulted to speak with pt regarding current lack of insurance ? ?Pt and pt granddaughter reports that CAFA was completed at [facility] and that they were going to be sending to [place] to get applied to [place] [facility] bills. ? ?Unable to see this in financial counseling notes but reached out to financial counselors regarding this and able to confirm that CAFA approved till June at 100% but that it cannot be applied to current bills due to awaiting Medicaid screen.  CSW spoke with Karel Jarvis and they are screening pt out due to citizenship status (currently in process of getting VISA but nothing approved) ? ?CSW updated [facility] financial counselor so they can help adjust current bills ? ?Will continue to follow and assist as needed ? ?Jorge Ny, LCSW ?Clinical Social Worker ?Advanced Heart Failure Clinic ?Desk#: [phone] ?Cell#: [phone] ? ?

## 2021-10-28 ENCOUNTER — Other Ambulatory Visit (HOSPITAL_COMMUNITY): Payer: Self-pay | Admitting: *Deleted

## 2021-10-28 MED ORDER — APIXABAN 5 MG PO TABS: 5.0000 mg | ORAL_TABLET | Freq: Two times a day (BID) | ORAL | 3 refills | Status: AC

## 2021-11-03 NOTE — Progress Notes (Signed)
?Advanced Heart Failure Clinic Note  ? ?PCP: Pcp, No ?HF Cardiologist: Glori Bickers, MD  ?EP: Dr. Rayann Heman  ? ?Reason for Visit: f/u for Chronic Systolic Heart Failure  ? ?HPI: ?Megan Keith is a 61 y.o.female with chronic systolic CHF (Echo EF 62-37% in 03/2017)  PAF, and hyperthyroidisum.    ? ?Admitted to [facility] 8/18 with ADHF. Prior EF with Dr. [name] was 35%. Repeat echo EF 15%. Transferred to [facility]. R/LHC. no CAD. Fick output/index 5.3/3.3. Noted to have >20% PVC on telemetry. Started on amiodarone which she susequently stopped.  ? ?Echo 11/19 25-30% ?  ?Admitted 7/20 with ADHF with new-onset AF in the setting of hyperthyroidism. TEE with EF 10% RV ok. Started on amiodarone and diuresed. Underwent TEE/DC-CV and felt much better. Also treated with methimazole and prednisone.  ? ?Zio patch placed to monitor PVC burden.  ?  ?Zio patch 8/20 ?- NSR avg HR 75 ?- 13 runs NSVT longest 14 beats ?- 59 runs SVT longest 9.4 seconds ?- PVC burden 4.8% ?  ?Admitted 10/20 for COVID 19 infection c/b recurrent afib w/ RVR. Was loaded on IV amiodarone and rate improved but did not convert back to NSR. Was placed on Eliquis and discharged home on PO digoxin and metoprolol. ?  ?Admitted from HF clinic 11/20 with A Fib RVR -> ADHF.Methimazole and prednisone continued for hyperthyroidism.  EP was consulted. Initial plans were for PVI ablation but procedure canceled after TEE showed severely reduced EF at 5% and severe LAE. Had DC-CV with conversion to NSR.  She was also treated w/ Losartan (BP too soft for Entesto), spironolactone and digoxin.  ? ?Saw Dr. Rayann Heman on 10/22/19 and was still in NSR. Discussed possible AF ablation. There was concern over degree of MR and LAE.  ? ?She finished treatment for hyperthyroidism with Dr. [name] 5/21.   ? ?Admitted [name] 9/21 She was reportedly treated for mild hemoptysis in setting of PNA and CHF. Treated w/ abx and lasix. Had low BP and was taken off losartan and  Coreg.   ? ?11/21 High PVC burden so Mexitil 200 mg bid started.  ? ?Repeat Echo (4/22) EF 20-25%  ? ?Zio (5/22) (on mexiletine). Occasional PVCs, burden low at 3.3%. Also had 32 runs of SVT, the longest lasting 10 beats w/ average HR 104 bpm.  ? ?Diagnosed w/ cervical cancer 7/22. ? ?Underwent RHC 03/12/21 after having increased fatigue/decreased exercise tolerance; concern for low output. Found to have markedly elevated filling pressures and preserved CO (see below). She was given metolazone 2.5 x 2 days and lasix increased to 60 mg daily. ? ?S/p total hysertectomy 05/08/21, tolerated well. Lasix was held with low BP with instructions to restart when sBP>100. ? ?Found to be in ectopic AT, amiodarone restarted 1/23. Follow up 2/23 breathing improved and weight was down. Amiodarone reduced. ? ?Echo 2/23 showed EF 20%, severe LV dysfunction, grade III DD, RV moderately reduced, moderate MR, TR moderate to severe ? ?Acute visit 10/27/21, volume mildly up, lasix stopped and torsemide 40 mg started. ? ?Today she returns for HF follow up with grand daughter and interpretor. Torsemide has caused bloating and burning feeling in stomach. Breathing is overall better, remains fatigued during the day. BP has been on the low side and having some dizziness. Denies CP, palpitations, abnormal bleeding, edema, or PND/Orthopnea. Appetite ok. No fever or chills. Weight at home 136-137 pounds. Taking all medications. [name] helps with meds. ? ?Cardiac Studies   ?Echo (8/18): EF  10-15% ?Echo (12/18): EF 25-30%  ?Echo (4/19): EF 25-30% ?Echo (11/19): EF ~30% (per Dr Haroldine Laws) ?Echo (8/20): EF 25-30%  ?TEE (11/20): EF 5-10% ?Echo (2/21): EF 30-35% moderate MR ?Echo (5/21): EF 25-30%. RV normal.  ?Echo (11/21): EF 20-25% RV normal. ?Echo (2/23): EF 20%, severe LV dysfunction, grade III DD, RV moderately reduced, moderate MR, TR moderate to severe ? ?- RHC (7/22): Elevated filling pressures w/ preserved CO; prominent v-waves  suggesting significant MR vs. diastolic dysfunction. ?  ?RA = 3 ?RV = 57/5 ?PA = 58/28 (43) ?PCW = 30 (v = 45) ?Fick cardiac output/index = 4.6/2.8 ?PVR = 2.8 WU ?FA sat = 95% ?PA sat = 67%, 69% ?SVC = 74% ?  ?Assessment: ?Markedly elevated filling pressures with preserved cardiac output. ?Prominent v-waves in the PCWP tracing suggestive of significant MR vs diastolic dysfunction ?  ?R/LHC (8/18): normal cors ? ?Right Heart ?RA (mean): 5 mmHg ?RV (S/EDP): 52/4 mmHg ?PA (S/D, mean): 52/20 (31) mmHg ?PCWP (mean): 30 mmHg ?Ao sat: 96% ?PA sat: 71% ?RA sat: 74% ?Fick CO: 5.3 L/min ?Fick CI: 3.3 L/min/m^2 ?SVR: 1,101 dyn?s?cm-5 ? ?- Zio patch (8/20): ?- NSR avg HR 75 ?- 13 runs NSVT longest 14 beats ?- 59 runs SVT longest 9.4 seconds ?- PVC burden 4.8% ? ?- Zio Patch (5/22): ?1. Sinus rhythm - avg HR of 79 bpm. ?2. 32 runs of SVT - the longest lasting 10 beats with an avg rate of 104 bpm ?3. PVCs  were occasional (3.3%, 30573) ? ?Past Medical History:  ?Diagnosis Date  ? A-fib ([place])   ? Abnormal TSH   ? Anemia   ? Arthritis   ? "knees, hands" (04/15/2017)  ? Breast cancer ([place])   ? CHF (congestive heart failure) ([place])   ? Chronic knee pain   ? Chronic systolic CHF (congestive heart failure) ([place])   ? COVID-19   ? Does not have health insurance   ? Frequent PVCs   ? Hyperthyroidism 03/05/2019  ? Hyperthyroidism 03/05/2019  ? 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 NICM (nonischemic cardiomyopathy) ([place])   ? a. ? PVC cardiomyopathy. right and left heart catheterization on 04/15/17, no CAD. Fick output/index 5.28/3.34.  ? Noncompliance   ? a. episodic noncompliance.  ? Pneumonia 06/2016  ? ?Current Outpatient Medications  ?Medication Sig Dispense Refill  ? acetaminophen (TYLENOL) 325 MG tablet Take 325-650 mg by mouth every 6 (six) hours as needed (for pain.).    ? amiodarone (PACERONE) 200 MG tablet Take 200 mg by mouth 2 (two) times daily.    ? anastrozole (ARIMIDEX) 1 MG tablet TAKE 1 TABLET BY MOUTH IN THE  MORNING. PATIENT TAKES IT AT NIGHT. 90 tablet 0  ? apixaban (ELIQUIS) 5 MG TABS tablet Take 1 tablet (5 mg total) by mouth 2 (two) times daily. 60 tablet 3  ? carvedilol (COREG) 3.125 MG tablet TAKE 1 Tablet  BY MOUTH TWICE DAILY 60 tablet 6  ? cetirizine (ZYRTEC) 10 MG tablet Take 10 mg by mouth at bedtime.    ? mexiletine (MEXITIL) 200 MG capsule Take 1 capsule (200 mg total) by mouth 2 (two) times daily. 60 capsule 6  ? omeprazole (PRILOSEC) 20 MG capsule Take 20 mg by mouth in the morning.    ? torsemide (DEMADEX) 20 MG tablet Take 2 tablets (40 mg total) by mouth daily. 60 tablet 4  ? ?No current facility-administered medications for this encounter.  ? ?No Known Allergies ? ?Social History  ? ?  Socioeconomic History  ? Marital status: Single  ?  Spouse name: Not on file  ? Number of children: Not on file  ? Years of education: Not on file  ? Highest education level: Not on file  ?Occupational History  ? Not on file  ?Tobacco Use  ? Smoking status: Never  ? Smokeless tobacco: Never  ?Vaping Use  ? Vaping Use: Never used  ?Substance and Sexual Activity  ? Alcohol use: Not Currently  ?  Comment: 04/15/2017 "might have a couple beers q couple months"  ? Drug use: Not Currently  ? Sexual activity: Not Currently  ?Other Topics Concern  ? Not on file  ?Social History Narrative  ? ** Merged History Encounter **  ?    ? N/A  ? ?Social Determinants of Health  ? ?Financial Resource Strain: Not on file  ?Food Insecurity: Not on file  ?Transportation Needs: Not on file  ?Physical Activity: Not on file  ?Stress: Not on file  ?Social Connections: Not on file  ?Intimate Partner Violence: Not on file  ? ?Family History  ?Problem Relation Age of Onset  ? Hypertension Mother   ? ?BP 90/64   Pulse 100   Wt 64 kg (141 lb)   SpO2 100%   BMI 27.54 kg/m?  ? ?Wt Readings from Last 3 Encounters:  ?11/06/21 64 kg (141 lb)  ?10/27/21 65 kg (143 lb 3.2 oz)  ?09/23/21 63.2 kg (139 lb 6.4 oz)  ? ?PHYSICAL EXAM: ?General:  NAD. No resp  difficulty ?HEENT: Normal ?Neck: Supple. No JVD. Carotids 2+ bilat; no bruits. No lymphadenopathy or thryomegaly appreciated. ?Cor: PMI nondisplaced. Regular rate & rhythm. No rubs, gallops or murmurs. ?Lungs: Clear ?Abdom

## 2021-11-06 ENCOUNTER — Ambulatory Visit (HOSPITAL_COMMUNITY)
Admission: RE | Admit: 2021-11-06 | Discharge: 2021-11-06 | Disposition: A | Discharge status: Home / Self Care | Payer: Self-pay | Source: Ambulatory Visit | Attending: Family Medicine | Admitting: Family Medicine

## 2021-11-06 ENCOUNTER — Other Ambulatory Visit: Payer: Self-pay

## 2021-11-06 ENCOUNTER — Encounter (HOSPITAL_COMMUNITY): Payer: Self-pay

## 2021-11-06 VITALS — BP 90/64 | HR 100 | Wt 141.0 lb

## 2021-11-06 DIAGNOSIS — C801 Malignant (primary) neoplasm, unspecified: Secondary | ICD-10-CM | POA: Insufficient documentation

## 2021-11-06 DIAGNOSIS — I5022 Chronic systolic (congestive) heart failure: Secondary | ICD-10-CM | POA: Insufficient documentation

## 2021-11-06 DIAGNOSIS — I493 Ventricular premature depolarization: Secondary | ICD-10-CM | POA: Insufficient documentation

## 2021-11-06 DIAGNOSIS — C539 Malignant neoplasm of cervix uteri, unspecified: Secondary | ICD-10-CM

## 2021-11-06 DIAGNOSIS — I471 Supraventricular tachycardia: Secondary | ICD-10-CM | POA: Insufficient documentation

## 2021-11-06 DIAGNOSIS — Z8616 Personal history of COVID-19: Secondary | ICD-10-CM | POA: Insufficient documentation

## 2021-11-06 DIAGNOSIS — I480 Paroxysmal atrial fibrillation: Secondary | ICD-10-CM | POA: Insufficient documentation

## 2021-11-06 DIAGNOSIS — C7982 Secondary malignant neoplasm of genital organs: Secondary | ICD-10-CM | POA: Insufficient documentation

## 2021-11-06 DIAGNOSIS — I340 Nonrheumatic mitral (valve) insufficiency: Secondary | ICD-10-CM

## 2021-11-06 DIAGNOSIS — Z79899 Other long term (current) drug therapy: Secondary | ICD-10-CM | POA: Insufficient documentation

## 2021-11-06 DIAGNOSIS — I428 Other cardiomyopathies: Secondary | ICD-10-CM | POA: Insufficient documentation

## 2021-11-06 DIAGNOSIS — Z923 Personal history of irradiation: Secondary | ICD-10-CM | POA: Insufficient documentation

## 2021-11-06 DIAGNOSIS — C50912 Malignant neoplasm of unspecified site of left female breast: Secondary | ICD-10-CM | POA: Insufficient documentation

## 2021-11-06 DIAGNOSIS — E0590 Thyrotoxicosis, unspecified without thyrotoxic crisis or storm: Secondary | ICD-10-CM | POA: Insufficient documentation

## 2021-11-06 DIAGNOSIS — Z7901 Long term (current) use of anticoagulants: Secondary | ICD-10-CM | POA: Insufficient documentation

## 2021-11-06 DIAGNOSIS — Z853 Personal history of malignant neoplasm of breast: Secondary | ICD-10-CM

## 2021-11-06 LAB — BASIC METABOLIC PANEL
Anion gap: 9 (ref 5–15)
BUN: 15 mg/dL (ref 8–23)
CO2: 25 mmol/L (ref 22–32)
Calcium: 9.4 mg/dL (ref 8.9–10.3)
Chloride: 107 mmol/L (ref 98–111)
Creatinine, Ser: 0.81 mg/dL (ref 0.44–1.00)
GFR, Estimated: >60 mL/min (ref >60)
Glucose, Bld: 99 mg/dL (ref 70–99)
Potassium: 4.4 mmol/L (ref 3.5–5.1)
Sodium: 141 mmol/L (ref 135–145)

## 2021-11-06 MED ORDER — FUROSEMIDE 80 MG PO TABS: 80.0000 mg | ORAL_TABLET | Freq: Every day | ORAL | 3 refills | Status: DC

## 2021-11-06 MED ORDER — METOPROLOL SUCCINATE ER 25 MG PO TB24: 12.5000 mg | ORAL_TABLET | Freq: Every day | ORAL | 3 refills | Status: DC

## 2021-11-06 NOTE — Progress Notes (Signed)
ReDS Vest / Clip - 11/06/21 1000   ? ?  ? ReDS Vest / Clip  ? Station Marker A   ? Ruler Value 32.5   ? ReDS Value Range Moderate volume overload   ? ReDS Actual Value 36   ? ?  ?  ? ?  ? ? ?

## 2021-11-06 NOTE — Patient Instructions (Addendum)
Dejar de carvedilol ? ?Dejar de torasemida ? ?Comience Metoprolol XL 12.5 mg (1/2 tableta) diariamente a la HORA DE ACOSTARSE ? ?Iniciar furosemida 80 mg diarios ? ?Laboratorios realizados hoy, lo llamaremos si hay resultados anormales ? ?Su m?dico le recomienda programar una cita de seguimiento en: seg?n lo programado con el Dr. Haroldine Laws ? ?Si tiene alguna pregunta o inquietud antes de su pr?xima cita, env?enos un mensaje a trav?s de mychart o llame a nuestra oficina al [phone]. ? ?[place]?N 2, POR FAVOR DEJE UN MENSAJE QUE INCLUYA: ? SU NOMBRE ? [place] ? N?MERO DE DEVOLUCI?N DE LLAMADA ? MOTIVO DE LA LLAMADA**esto es importante ya que damos prioridad a las devoluciones de llamadas ? ?RECIBIR? UNA LLAMADA EL MISMO D?A SIEMPRE QUE LLAME ANTES DE LAS 4:00 PM ?

## 2021-11-18 NOTE — Progress Notes (Signed)
?[place]  ?[address] ?[place],  [place]  [address] ?[phone] ? ?Clinic Day:  11/19/2021 ? ?Referring physician: No ref. provider found ? ?HISTORY OF PRESENT ILLNESS:  ?The patient is a 61 y.o. 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  She also completed adjuvant breast radiation to prevent local disease recurrence.  Currently, the patient is taking anastrozole for her adjuvant hormonal management.  She comes in today routine follow-up.  Since her last visit, the patient has been doing okay.  She denies having any particular changes in her breasts which concern her for disease recurrence.   Of note, the patient is yet to undergo her annual mammogram.  Also, since her last visit, the patient underwent a laparoscopic hysterectomy in September 2022 which revealed stage IB1 endocervical adenocarcinoma.  The patient also underwent adjuvant radiation at our facility to prevent local disease recurrence.  [facility] this [facility] showed her to be disease-free. ? ?PHYSICAL EXAM:  ?Blood pressure 110/79, pulse (!) 111, temperature 98.8 ?F (37.1 ?C), resp. rate 14, height 5' (1.524 m), weight 141 lb 11.2 oz (64.3 kg), SpO2 99 %. ?Wt Readings from Last 3 Encounters:  ?11/19/21 141 lb 11.2 oz (64.3 kg)  ?11/06/21 141 lb (64 kg)  ?10/27/21 143 lb 3.2 oz (65 kg)  ? ?Body mass index is 27.67 kg/m?.Marland Kitchen?Performance status (ECOG): 1 - Symptomatic but completely ambulatory ?Physical Exam ?Constitutional:   ?   Appearance: Normal appearance.  ?HENT:  ?   Mouth/Throat:  ?   Pharynx: Oropharynx is clear. No oropharyngeal exudate.  ?Cardiovascular:  ?   Rate and Rhythm: Normal rate and regular rhythm.  ?   Heart sounds: No murmur heard. ?  No friction rub. No gallop.  ?Pulmonary:  ?   Breath sounds: Normal breath sounds.  ?Chest:  ?Breasts: ?   Right: No swelling, bleeding, inverted nipple, mass,  nipple discharge or skin change.  ?   Left: No swelling, bleeding, inverted nipple, mass, nipple discharge or skin change.  ?Abdominal:  ?   General: Bowel sounds are normal. There is no distension.  ?   Palpations: Abdomen is soft. There is no mass.  ?   Tenderness: There is no abdominal tenderness.  ?Musculoskeletal:     ?   General: No tenderness.  ?   Cervical back: Normal range of motion and neck supple.  ?   Right lower leg: No edema.  ?   Left lower leg: No edema.  ?Lymphadenopathy:  ?   Cervical: No cervical adenopathy.  ?   Right cervical: No superficial, deep or posterior cervical adenopathy. ?   Left cervical: No superficial, deep or posterior cervical adenopathy.  ?   Upper Body:  ?   Right upper body: No supraclavicular or axillary adenopathy.  ?   Left upper body: No supraclavicular or axillary adenopathy.  ?   Lower Body: No right inguinal adenopathy. No left inguinal adenopathy.  ?Skin: ?   Coloration: Skin is not jaundiced.  ?   Findings: No lesion or rash.  ?Neurological:  ?   General: No focal deficit present.  ?   Mental Status: She is alert and oriented to person, place, and time. Mental status is at baseline.  ?Psychiatric:     ?   Mood and Affect: Mood normal.     ?   Behavior: Behavior normal.     ?  Thought Content: Thought content normal.     ?   Judgment: Judgment normal.  ? ?ASSESSMENT & PLAN:  ?Assessment/Plan:  A 60 y.o. female with stage IA (T41m N0 M0) hormone positive breast cancer, status post a lumpectomy in April 2019.  Based upon her clinical breast exam today, the patient remains disease-free.  She knows to continue taking her anastrozole on a daily basis to complete 5 total years of adjuvant endocrine therapy.  As mentioned previously, the patient has yet to undergo her annual mammogram.  I will arrange for this study to be done in the forthcoming weeks.  This patient also carries a diagnosis of stage IB1 endocervical adenocarcinoma for which she underwent a hysterectomy  followed by adjuvant radiation.  Based upon [facility] she remains disease-free from this malignancy.  She knows to continue to keep her appointments there for her disease surveillance.  Otherwise, as she is doing well, I will her back in 6 months for a repeat clinical breast exam.  The patient understands all the plans discussed today and is in agreement with them.   ? ?Marlys Stegmaier AMacarthur Critchley MD ? ? ? ? ? ? ?

## 2021-11-19 ENCOUNTER — Inpatient Hospital Stay: Payer: Self-pay | Attending: Oncology | Admitting: Oncology

## 2021-11-19 ENCOUNTER — Other Ambulatory Visit: Payer: Self-pay | Admitting: Oncology

## 2021-11-19 VITALS — BP 110/79 | HR 111 | Temp 98.8°F | Resp 14 | Ht 60.0 in | Wt 141.7 lb

## 2021-11-19 DIAGNOSIS — C50511 Malignant neoplasm of lower-outer quadrant of right female breast: Secondary | ICD-10-CM

## 2021-11-19 DIAGNOSIS — Z170 Estrogen receptor positive status [ER+]: Secondary | ICD-10-CM

## 2021-11-19 DIAGNOSIS — C50412 Malignant neoplasm of upper-outer quadrant of left female breast: Secondary | ICD-10-CM

## 2021-12-01 ENCOUNTER — Other Ambulatory Visit (HOSPITAL_COMMUNITY): Payer: Self-pay

## 2021-12-01 ENCOUNTER — Encounter (HOSPITAL_COMMUNITY): Payer: Self-pay | Admitting: Internal Medicine

## 2021-12-01 ENCOUNTER — Telehealth (HOSPITAL_COMMUNITY): Payer: Self-pay | Admitting: Pharmacy Technician

## 2021-12-01 ENCOUNTER — Ambulatory Visit (HOSPITAL_COMMUNITY)
Admission: RE | Admit: 2021-12-01 | Discharge: 2021-12-01 | Disposition: A | Discharge status: Home / Self Care | Payer: Self-pay | Source: Ambulatory Visit | Attending: Internal Medicine | Admitting: Internal Medicine

## 2021-12-01 VITALS — BP 90/58 | HR 109 | Wt 142.2 lb

## 2021-12-01 DIAGNOSIS — I471 Supraventricular tachycardia: Secondary | ICD-10-CM

## 2021-12-01 DIAGNOSIS — R002 Palpitations: Secondary | ICD-10-CM | POA: Insufficient documentation

## 2021-12-01 DIAGNOSIS — Z853 Personal history of malignant neoplasm of breast: Secondary | ICD-10-CM | POA: Insufficient documentation

## 2021-12-01 DIAGNOSIS — E0590 Thyrotoxicosis, unspecified without thyrotoxic crisis or storm: Secondary | ICD-10-CM | POA: Insufficient documentation

## 2021-12-01 DIAGNOSIS — Z8249 Family history of ischemic heart disease and other diseases of the circulatory system: Secondary | ICD-10-CM | POA: Insufficient documentation

## 2021-12-01 DIAGNOSIS — Z8541 Personal history of malignant neoplasm of cervix uteri: Secondary | ICD-10-CM | POA: Insufficient documentation

## 2021-12-01 DIAGNOSIS — I493 Ventricular premature depolarization: Secondary | ICD-10-CM | POA: Insufficient documentation

## 2021-12-01 DIAGNOSIS — Z7901 Long term (current) use of anticoagulants: Secondary | ICD-10-CM | POA: Insufficient documentation

## 2021-12-01 DIAGNOSIS — Z79899 Other long term (current) drug therapy: Secondary | ICD-10-CM | POA: Insufficient documentation

## 2021-12-01 DIAGNOSIS — I428 Other cardiomyopathies: Secondary | ICD-10-CM | POA: Insufficient documentation

## 2021-12-01 DIAGNOSIS — I5022 Chronic systolic (congestive) heart failure: Secondary | ICD-10-CM | POA: Insufficient documentation

## 2021-12-01 DIAGNOSIS — Z8616 Personal history of COVID-19: Secondary | ICD-10-CM | POA: Insufficient documentation

## 2021-12-01 DIAGNOSIS — Z923 Personal history of irradiation: Secondary | ICD-10-CM | POA: Insufficient documentation

## 2021-12-01 DIAGNOSIS — I480 Paroxysmal atrial fibrillation: Secondary | ICD-10-CM | POA: Insufficient documentation

## 2021-12-01 LAB — BASIC METABOLIC PANEL
Anion gap: 9 (ref 5–15)
BUN: 12 mg/dL (ref 8–23)
CO2: 24 mmol/L (ref 22–32)
Calcium: 9.3 mg/dL (ref 8.9–10.3)
Chloride: 108 mmol/L (ref 98–111)
Creatinine, Ser: 0.99 mg/dL (ref 0.44–1.00)
GFR, Estimated: >60 mL/min (ref >60)
Glucose, Bld: 121 mg/dL — ABNORMAL HIGH (ref 70–99)
Potassium: 3.9 mmol/L (ref 3.5–5.1)
Sodium: 141 mmol/L (ref 135–145)

## 2021-12-01 LAB — CBC
HCT: 38.1 % (ref 36.0–46.0)
Hemoglobin: 12.2 g/dL (ref 12.0–15.0)
MCH: 29 pg (ref 26.0–34.0)
MCHC: 32 g/dL (ref 30.0–36.0)
MCV: 90.5 fL (ref 80.0–100.0)
Platelets: 303 10*3/uL (ref 150–400)
RBC: 4.21 MIL/uL (ref 3.87–5.11)
RDW: 16.9 % — ABNORMAL HIGH (ref 11.5–15.5)
WBC: 5.8 10*3/uL (ref 4.0–10.5)
nRBC: 0 % (ref 0.0–0.2)

## 2021-12-01 LAB — BRAIN NATRIURETIC PEPTIDE: B Natriuretic Peptide: 1546.2 pg/mL — ABNORMAL HIGH (ref 0.0–100.0)

## 2021-12-01 NOTE — Progress Notes (Signed)
?Advanced Heart Failure Clinic Note  ? ?PCP: Cathleen Corti, FNP ?HF Cardiologist: Glori Bickers, MD  ?EP: Dr. Rayann Heman  ? ?Reason for Visit:HFrEF ? ?HPI: ?Megan Keith is a 61 y.o.female with chronic systolic CHF (Echo EF 97-35% in 03/2017)  PAF, cervical cancer, PVCs, and hyperthyroidisum.    ? ?Admitted to [facility] 8/18 with ADHF. Prior EF with Dr. [name] was 35%. Repeat echo EF 15%. Transferred to [facility]. R/LHC  no CAD and fick output/index 5.3/3.3. Noted to have >20% PVC on telemetry. Started on amiodarone which she susequently stopped.  ? ?Admitted 7/20 with ADHF with new-onset AF in the setting of hyperthyroidism. TEE with EF 10% RV ok. Started on amiodarone and diuresed. Underwent TEE/DC-CV and felt much better. Also treated with methimazole and prednisone.  ? ?Admitted 10/20 for COVID 19 infection c/b recurrent afib w/ RVR. Loaded on amio and converted. ?  ?Admitted from HF clinic 11/20 with A Fib RVR -> ADHF.Methimazole and prednisone continued for hyperthyroidism.  EP was consulted. Initial plans were for PVI ablation but procedure canceled after TEE showed severely reduced EF at 5% and severe LAE. Had DC-CV with conversion to NSR.  She was also treated w/ Losartan (BP too soft for Entesto), spironolactone and digoxin.  ? ?Saw EP 10/22/19 and was still in NSR. Discussed possible AF ablation. There was concern over degree of MR and LAE.  ? ?She finished treatment for hyperthyroidism with Dr. [name] 5/21.   ? ?Admitted [name] 9/21 She was reportedly treated for mild hemoptysis in setting of PNA and CHF. Treated w/ abx and lasix. Had low BP and was taken off losartan and Coreg.   ? ?11/21 High PVC burden so Mexitil 200 mg bid started.  ? ?Repeat Echo (4/22) EF 20-25%  ? ?Zio (5/22) (on mexiletine). Occasional PVCs, burden low at 3.3%. Also had 32 runs of SVT, the longest lasting 10 beats w/ average HR 104 bpm.  ? ?Diagnosed w/ cervical cancer 7/22. ? ?Underwent RHC 03/12/21 elevated  filling pressures and preserved CO (see below). She was given metolazone 2.5 x 2 days and lasix increased to 60 mg daily. ? ?S/p total hysertectomy 05/08/21 for cervical cancer.  Lasix was held with low BP with instructions to restart when sBP>100. ? ?Found to be in ectopic AT, amiodarone restarted 1/23. Follow up 2/23 breathing improved and weight was down. Amiodarone reduced. ? ?Echo 2/23 showed EF 20%, severe LV dysfunction, grade III DD, RV moderately reduced, moderate MR, TR moderate to severe ? ?Acute visit 10/27/21, volume mildly up, lasix stopped and torsemide 40 mg started. She was switched back to lasix because she had back pain.   ? ?Today she returns for HF follow up.Overall feeling terrible. Complaining of shortness of breath, palpitations, and orthopnea. fine. Denies PND. Appetite ok. No fever or chills. Weight at home  has gone up 4-5 pounds. Taking all medications ? ?Cardiac Studies   ?Echo (8/18): EF 10-15% ?Echo (12/18): EF 25-30%  ?Echo (4/19): EF 25-30% ?Echo (11/19): EF ~30% (per Dr Haroldine Laws) ?Echo (8/20): EF 25-30%  ?TEE (11/20): EF 5-10% ?Echo (2/21): EF 30-35% moderate MR ?Echo (5/21): EF 25-30%. RV normal.  ?Echo (11/21): EF 20-25% RV normal. ?Echo (2/23): EF 20%, severe LV dysfunction, grade III DD, RV moderately reduced, moderate MR, TR moderate to severe ? ?- RHC (7/22): Elevated filling pressures w/ preserved CO; prominent v-waves suggesting significant MR vs. diastolic dysfunction.  ?RA = 3 ?RV = 57/5 ?PA = 58/28 (43) ?PCW = 30 (v =  64) ?Fick cardiac output/index = 4.6/2.8 ?PVR = 2.8 WU ?FA sat = 95% ?PA sat = 67%, 69% ?SVC = 74% ? Assessment: ?Markedly elevated filling pressures with preserved cardiac output. ?Prominent v-waves in the PCWP tracing suggestive of significant MR vs diastolic dysfunction ?  ?R/LHC (8/18): normal cors ?Right Heart ?RA (mean): 5 mmHg ?RV (S/EDP): 52/4 mmHg ?PA (S/D, mean): 52/20 (31) mmHg ?PCWP (mean): 30 mmHg ?Ao sat: 96% ?PA sat: 71% ?RA sat: 74% ?Fick CO:  5.3 L/min ?Fick CI: 3.3 L/min/m^2 ?SVR: 1,101 dyn?s?cm-5 ? ?- Zio patch (8/20): ?- NSR avg HR 75 ?- 13 runs NSVT longest 14 beats ?- 59 runs SVT longest 9.4 seconds ?- PVC burden 4.8% ? ?- Zio Patch (5/22): ?1. Sinus rhythm - avg HR of 79 bpm. ?2. 32 runs of SVT - the longest lasting 10 beats with an avg rate of 104 bpm ?3. PVCs  were occasional (3.3%, 30573) ? ?Past Medical History:  ?Diagnosis Date  ? A-fib ([place])   ? Abnormal TSH   ? Anemia   ? Arthritis   ? "knees, hands" (04/15/2017)  ? Breast cancer ([place])   ? CHF (congestive heart failure) ([place])   ? Chronic knee pain   ? Chronic systolic CHF (congestive heart failure) ([place])   ? COVID-19   ? Does not have health insurance   ? Frequent PVCs   ? Hyperthyroidism 03/05/2019  ? Hyperthyroidism 03/05/2019  ? 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 NICM (nonischemic cardiomyopathy) ([place])   ? a. ? PVC cardiomyopathy. right and left heart catheterization on 04/15/17, no CAD. Fick output/index 5.28/3.34.  ? Noncompliance   ? a. episodic noncompliance.  ? Pneumonia 06/2016  ? ?Current Outpatient Medications  ?Medication Sig Dispense Refill  ? acetaminophen (TYLENOL) 325 MG tablet Take 325-650 mg by mouth every 6 (six) hours as needed (for pain.).    ? amiodarone (PACERONE) 200 MG tablet Take 200 mg by mouth 2 (two) times daily.    ? anastrozole (ARIMIDEX) 1 MG tablet TAKE 1 TABLET BY MOUTH IN THE MORNING. PATIENT TAKES IT AT NIGHT. 90 tablet 0  ? apixaban (ELIQUIS) 5 MG TABS tablet Take 1 tablet (5 mg total) by mouth 2 (two) times daily. 60 tablet 3  ? cetirizine (ZYRTEC) 10 MG tablet Take 10 mg by mouth at bedtime.    ? furosemide (LASIX) 40 MG tablet Take 40 mg by mouth daily.    ? metoprolol succinate (TOPROL XL) 25 MG 24 hr tablet Take 0.5 tablets (12.5 mg total) by mouth at bedtime. 30 tablet 3  ? mexiletine (MEXITIL) 200 MG capsule Take 1 capsule (200 mg total) by mouth 2 (two) times daily. 60 capsule 6  ? omeprazole (PRILOSEC) 20 MG capsule Take 20 mg by  mouth in the morning.    ? ?No current facility-administered medications for this encounter.  ? ?No Known Allergies ? ?Social History  ? ?Socioeconomic History  ? Marital status: Single  ?  Spouse name: Not on file  ? Number of children: Not on file  ? Years of education: Not on file  ? Highest education level: Not on file  ?Occupational History  ? Not on file  ?Tobacco Use  ? Smoking status: Never  ? Smokeless tobacco: Never  ?Vaping Use  ? Vaping Use: Never used  ?Substance and Sexual Activity  ? Alcohol use: Not Currently  ?  Comment: 04/15/2017 "might have a couple beers q couple months"  ? Drug use: Not Currently  ?  Sexual activity: Not Currently  ?Other Topics Concern  ? Not on file  ?Social History Narrative  ? ** Merged History Encounter **  ?    ? N/A  ? ?Social Determinants of Health  ? ?Financial Resource Strain: Not on file  ?Food Insecurity: Not on file  ?Transportation Needs: Not on file  ?Physical Activity: Not on file  ?Stress: Not on file  ?Social Connections: Not on file  ?Intimate Partner Violence: Not on file  ? ?Family History  ?Problem Relation Age of Onset  ? Hypertension Mother   ? ?BP (!) 90/58   Pulse (!) 109   Wt 64.5 kg (142 lb 3.2 oz)   SpO2 98%   BMI 27.77 kg/m?  ? ?Wt Readings from Last 3 Encounters:  ?12/01/21 64.5 kg (142 lb 3.2 oz)  ?11/19/21 64.3 kg (141 lb 11.2 oz)  ?11/06/21 64 kg (141 lb)  ? ?PHYSICAL EXAM: ?General:   No resp difficulty ?HEENT: normal ?Neck: suppleJVP 11-12. Carotids 2+ bilat; no bruits. No lymphadenopathy or thryomegaly appreciated. ?Cor: PMI nondisplaced. Tachy regular rate & rhythm. No rubs, gallops or murmurs. ?Lungs: clear ?Abdomen: soft, nontender, nondistended. No hepatosplenomegaly. No bruits or masses. Good bowel sounds. ?Extremities: no cyanosis, clubbing, rash, R and LLE trace edema ?Neuro: alert & orientedx3, cranial nerves grossly intact. moves all 4 extremities w/o difficulty. Affect pleasant ?iented x 3, cranial nerves grossly intact. Moves  all 4 extremities w/o difficulty. Affect pleasant. ? ?EKG : Atrial Tachycardia 104 bpm  ? ?ASSESSMENT & PLAN: ?1. Chronic systolic HF: NICM, normal cors in 03/2017. 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  ?- Echo (03/2017):

## 2021-12-01 NOTE — Patient Instructions (Signed)
Increase Furosemide to 80 mg (2 tabs) Daily FOR 2 DAYS ONLY, then back to 40 mg (1 tab) Daily ? ?Labs done today, we will call you for abnormal results ? ?Your physician has recommended that you have a Cardioversion (DCCV). Electrical Cardioversion uses a jolt of electricity to your heart either through paddles or wired patches attached to your chest. This is a controlled, usually prescheduled, procedure. Defibrillation is done under light anesthesia in the hospital, and you usually go home the day of the procedure. This is done to get your heart back into a normal rhythm. You are not awake for the procedure. Please see the instruction below. ? ?Your physician recommends that you schedule a follow-up appointment in: 6 weeks ? ?If you have any questions or concerns before your next appointment please send [place] a message through [place] or call our office at [phone].   ? ?TO LEAVE A MESSAGE FOR THE NURSE SELECT OPTION 2, PLEASE LEAVE A MESSAGE INCLUDING: ?YOUR NAME ?DATE OF BIRTH ?CALL BACK NUMBER ?REASON FOR CALL**this is important as we prioritize the call backs ? ?YOU WILL RECEIVE A CALL BACK THE SAME DAY AS LONG AS YOU CALL BEFORE 4:00 PM ? ?At the [facility], you and your health needs are our priority. As part of our continuing mission to provide you with exceptional heart care, we have created designated Provider Care Teams. These Care Teams include your primary Cardiologist (physician) and Advanced Practice Providers (APPs- Physician Assistants and Nurse Practitioners) who all work together to provide you with the care you need, when you need it.  ? ?You may see any of the following providers on your designated Care Team at your next follow up: ?Dr Glori Bickers ?Dr Loralie Champagne ?Darrick Grinder, NP ?Lyda Jester, PA ?Jessica Milford,NP ?Marlyce Huge, PA ?Audry Riles, PharmD ? ? ?Please be sure to bring in all your medications bottles to every appointment.  ? ? ? ?CARDIOVERSION  INSTRUCTIONS ? ?You are scheduled for a Cardioversion on Monday 12/07/21 with Dr. Haroldine Laws.   ? ?Please arrive at the [facility] (Main Entrance A) at [facility]: [address] [place], [place] [address] at 8 am ? ?DIET: Nothing to eat or drink after midnight except a sip of water with medications (see medication instructions below) ? ?FYI: For your safety, and to allow [place] to monitor your vital signs accurately during the surgery/procedure we request that   ?if you have artificial nails, gel coating, SNS etc. Please have those removed prior to your surgery/procedure. Not having the nail coverings /polish removed may result in cancellation or delay of your surgery/procedure. ? ? ?Medication Instructions: ?Hold FUROSEMIDE Monday 4/17 AM ? ?Continue your anticoagulant: ELIQUIS TWICE A DAY, DO NOT MISS ANY DOSES ? ? ?Labs: DONE TODAY ? ?You must have a responsible person to drive you home and stay in the waiting area during your procedure. Failure to do so could result in cancellation. ? ?Interior and spatial designer cards. ? ?*Special Note: Every effort is made to have your procedure done on time. Occasionally there are emergencies that occur at the hospital that may cause delays. Please be patient if a delay does occur.  ? ? ? ?

## 2021-12-01 NOTE — Telephone Encounter (Addendum)
Sent in application via fax.  Will follow up.  

## 2021-12-01 NOTE — Telephone Encounter (Signed)
Advanced Heart Failure Patient Advocate Encounter ? ?Started [employer] assistance application for Eliquis renewal. Will send once all signatures are obtained.  ? ? ?

## 2021-12-01 NOTE — Progress Notes (Signed)
ReDS Vest / Clip - 12/01/21 1500   ? ?  ? ReDS Vest / Clip  ? Station Marker A   ? Ruler Value 35   ? ReDS Value Range Low volume   ? ReDS Actual Value 33   ? ?  ?  ? ?  ? ? ?

## 2021-12-02 ENCOUNTER — Other Ambulatory Visit (HOSPITAL_COMMUNITY): Payer: Self-pay

## 2021-12-02 ENCOUNTER — Telehealth (HOSPITAL_COMMUNITY): Payer: Self-pay

## 2021-12-02 DIAGNOSIS — I480 Paroxysmal atrial fibrillation: Secondary | ICD-10-CM

## 2021-12-02 NOTE — Telephone Encounter (Signed)
Spoke with Granddaughter to inform her of the time of her Grandmothers Cardioversion. She was told to be at the hospital at 12pm on 12/07/2021 and that her Grandmother could have a clear liquid breakfast at 6am the morning of the procedure.  ?

## 2021-12-02 NOTE — Telephone Encounter (Signed)
Advanced Heart Failure Patient Advocate Encounter  ? ?Patient was approved to receive Eliquis from [employer]. ? ?Effective dates: 12/02/21 through 12/02/22 ? ?Document scanned to chart.  ? ?Charlann Boxer, CPhT ? ? ? ?

## 2021-12-07 ENCOUNTER — Ambulatory Visit (HOSPITAL_COMMUNITY)
Admission: RE | Admit: 2021-12-07 | Discharge: 2021-12-07 | Disposition: A | Discharge status: Home / Self Care | Payer: Self-pay | Attending: Internal Medicine | Admitting: Internal Medicine

## 2021-12-07 ENCOUNTER — Encounter (HOSPITAL_COMMUNITY)
Admission: RE | Disposition: A | Discharge status: Home / Self Care | Payer: Self-pay | Source: Home / Self Care | Attending: Internal Medicine

## 2021-12-07 DIAGNOSIS — Z538 Procedure and treatment not carried out for other reasons: Secondary | ICD-10-CM | POA: Insufficient documentation

## 2021-12-07 DIAGNOSIS — I480 Paroxysmal atrial fibrillation: Secondary | ICD-10-CM

## 2021-12-07 DIAGNOSIS — I4891 Unspecified atrial fibrillation: Secondary | ICD-10-CM | POA: Insufficient documentation

## 2021-12-07 SURGERY — CANCELLED PROCEDURE

## 2021-12-07 MED ORDER — SODIUM CHLORIDE 0.9 % IV SOLN: INTRAVENOUS | Status: DC

## 2021-12-07 NOTE — H&P (Signed)
?  Patient in NSR on arrival to Endoscopy Unit.  ? ?Procedure cancelled ? ?Glori Bickers, MD  ?6:36 PM ? ?

## 2021-12-07 NOTE — Progress Notes (Signed)
Pt arrived for cardioversion in NSR.  Verified with EKG. Dr. Haroldine Laws aware. Pt discharged home with granddaughter. ? ?Vista Lawman, RN ?

## 2021-12-10 ENCOUNTER — Other Ambulatory Visit: Payer: Self-pay

## 2021-12-10 ENCOUNTER — Encounter (HOSPITAL_COMMUNITY): Payer: Self-pay | Admitting: Pharmacy Technician

## 2021-12-10 ENCOUNTER — Emergency Department (HOSPITAL_COMMUNITY): Payer: Self-pay

## 2021-12-10 ENCOUNTER — Emergency Department (HOSPITAL_COMMUNITY)
Admission: EM | Admit: 2021-12-10 | Discharge: 2021-12-10 | Disposition: A | Discharge status: Home / Self Care | Payer: Self-pay | Attending: Emergency Medicine | Admitting: Emergency Medicine

## 2021-12-10 DIAGNOSIS — I509 Heart failure, unspecified: Secondary | ICD-10-CM | POA: Insufficient documentation

## 2021-12-10 DIAGNOSIS — R7401 Elevation of levels of liver transaminase levels: Secondary | ICD-10-CM | POA: Insufficient documentation

## 2021-12-10 DIAGNOSIS — Z8541 Personal history of malignant neoplasm of cervix uteri: Secondary | ICD-10-CM | POA: Insufficient documentation

## 2021-12-10 DIAGNOSIS — M542 Cervicalgia: Secondary | ICD-10-CM | POA: Insufficient documentation

## 2021-12-10 DIAGNOSIS — R300 Dysuria: Secondary | ICD-10-CM | POA: Insufficient documentation

## 2021-12-10 DIAGNOSIS — R1084 Generalized abdominal pain: Secondary | ICD-10-CM | POA: Insufficient documentation

## 2021-12-10 DIAGNOSIS — Z853 Personal history of malignant neoplasm of breast: Secondary | ICD-10-CM | POA: Insufficient documentation

## 2021-12-10 DIAGNOSIS — R079 Chest pain, unspecified: Secondary | ICD-10-CM | POA: Insufficient documentation

## 2021-12-10 DIAGNOSIS — R000 Tachycardia, unspecified: Secondary | ICD-10-CM | POA: Insufficient documentation

## 2021-12-10 DIAGNOSIS — R531 Weakness: Secondary | ICD-10-CM | POA: Insufficient documentation

## 2021-12-10 DIAGNOSIS — R002 Palpitations: Secondary | ICD-10-CM | POA: Insufficient documentation

## 2021-12-10 DIAGNOSIS — Z7901 Long term (current) use of anticoagulants: Secondary | ICD-10-CM | POA: Insufficient documentation

## 2021-12-10 DIAGNOSIS — R42 Dizziness and giddiness: Secondary | ICD-10-CM | POA: Insufficient documentation

## 2021-12-10 DIAGNOSIS — Z79899 Other long term (current) drug therapy: Secondary | ICD-10-CM | POA: Insufficient documentation

## 2021-12-10 DIAGNOSIS — R112 Nausea with vomiting, unspecified: Secondary | ICD-10-CM | POA: Insufficient documentation

## 2021-12-10 LAB — COMPREHENSIVE METABOLIC PANEL
ALT: 35 U/L (ref 0–44)
AST: 43 U/L — ABNORMAL HIGH (ref 15–41)
Albumin: 3.7 g/dL (ref 3.5–5.0)
Alkaline Phosphatase: 92 U/L (ref 38–126)
Anion gap: 10 (ref 5–15)
BUN: 13 mg/dL (ref 8–23)
CO2: 18 mmol/L — ABNORMAL LOW (ref 22–32)
Calcium: 8.8 mg/dL — ABNORMAL LOW (ref 8.9–10.3)
Chloride: 106 mmol/L (ref 98–111)
Creatinine, Ser: 0.89 mg/dL (ref 0.44–1.00)
GFR, Estimated: >60 mL/min (ref >60)
Glucose, Bld: 123 mg/dL — ABNORMAL HIGH (ref 70–99)
Potassium: 4 mmol/L (ref 3.5–5.1)
Sodium: 134 mmol/L — ABNORMAL LOW (ref 135–145)
Total Bilirubin: 0.8 mg/dL (ref 0.3–1.2)
Total Protein: 6 g/dL — ABNORMAL LOW (ref 6.5–8.1)

## 2021-12-10 LAB — TROPONIN I (HIGH SENSITIVITY)
Troponin I (High Sensitivity): 12 ng/L (ref <18)
Troponin I (High Sensitivity): 11 ng/L (ref <18)

## 2021-12-10 LAB — CBC
HCT: 38.7 % (ref 36.0–46.0)
Hemoglobin: 12.2 g/dL (ref 12.0–15.0)
MCH: 28.8 pg (ref 26.0–34.0)
MCHC: 31.5 g/dL (ref 30.0–36.0)
MCV: 91.5 fL (ref 80.0–100.0)
Platelets: 311 10*3/uL (ref 150–400)
RBC: 4.23 MIL/uL (ref 3.87–5.11)
RDW: 16.9 % — ABNORMAL HIGH (ref 11.5–15.5)
WBC: 6.4 10*3/uL (ref 4.0–10.5)
nRBC: 0 % (ref 0.0–0.2)

## 2021-12-10 LAB — URINALYSIS, ROUTINE W REFLEX MICROSCOPIC
Bilirubin Urine: NEGATIVE
Glucose, UA: NEGATIVE mg/dL
Hgb urine dipstick: NEGATIVE
Ketones, ur: NEGATIVE mg/dL
Leukocytes,Ua: NEGATIVE
Nitrite: NEGATIVE
Protein, ur: NEGATIVE mg/dL
Specific Gravity, Urine: 1.005 (ref 1.005–1.030)
pH: 6 (ref 5.0–8.0)

## 2021-12-10 LAB — BRAIN NATRIURETIC PEPTIDE: B Natriuretic Peptide: 1571.6 pg/mL — ABNORMAL HIGH (ref 0.0–100.0)

## 2021-12-10 LAB — LIPASE, BLOOD: Lipase: 32 U/L (ref 11–51)

## 2021-12-10 MED ORDER — IOHEXOL 350 MG/ML SOLN
100.0000 mL | Freq: Once | INTRAVENOUS | Status: AC | PRN
  Administered 2021-12-10: 100 mL via INTRAVENOUS

## 2021-12-10 MED ORDER — TECHNETIUM TC 99M MEBROFENIN IV KIT
5.2000 | PACK | Freq: Once | INTRAVENOUS | Status: AC | PRN
  Administered 2021-12-10: 5.2 via INTRAVENOUS

## 2021-12-10 MED ORDER — ALUM & MAG HYDROXIDE-SIMETH 200-200-20 MG/5ML PO SUSP
30.0000 mL | Freq: Once | ORAL | Status: AC
  Administered 2021-12-10: 30 mL via ORAL
  Filled 2021-12-10: qty 30

## 2021-12-10 MED ORDER — FENTANYL CITRATE PF 50 MCG/ML IJ SOSY
50.0000 ug | PREFILLED_SYRINGE | Freq: Once | INTRAMUSCULAR | Status: DC
Start: 2021-12-10 — End: 2021-12-10
  Filled 2021-12-10: qty 1

## 2021-12-10 MED ORDER — LIDOCAINE VISCOUS HCL 2 % MT SOLN
15.0000 mL | Freq: Once | OROMUCOSAL | Status: AC
  Administered 2021-12-10: 15 mL via ORAL
  Filled 2021-12-10: qty 15

## 2021-12-10 NOTE — ED Provider Notes (Signed)
Transfer of Care Note ?I assumed care of Megan Keith on 12/10/2021  ? ?Briefly, Megan Keith is a 61 y.o. female who: ?- CP and abd pain x 1 week , now W sob, CT PE study negative. ?- Getting workup for RUQ / Acute chole  ? ?  ?CT Angio Chest PE W and/or Wo Contrast  ?Final Result  ?  ?CT ABDOMEN PELVIS W CONTRAST  ?Final Result  ?  ?DG Chest 2 View  ?Final Result  ?  ?NM Hepatobiliary Liver Func    (Results Pending)  ? ?The plan includes: ?F/U with HIDA scan ? ? ?Please refer to the original provider?s note for additional information regarding the care of Megan Keith. ? ?### ?Reassessment: ?I personally reassessed the patient: ?-Patient continued to have epigastric abdominal pain. ?Additional MDM: ?-Patient HIDA scan did not show any acute etiology.  I have given her Maalox with viscous lidocaine for possible GERD.  This time cannot rule out peptic ulcer disease.  She will be referred to GI specialist for future endoscopy.  Patient vital signs continue to remain stable.  Her mild tachycardia is most likely secondary to pain. ?-Clinically patient is stable for discharge.  Strict return precaution has been discussed.  Patient and her daughter understand and agrees with plan. ?  ?Megan Poag, MD ?12/10/21 1756 ? ?  ?Megan Gibbs, DO ?12/10/21 [id number] ? ?

## 2021-12-10 NOTE — ED Notes (Signed)
Transported to [facility] for hida scan ?

## 2021-12-10 NOTE — ED Provider Notes (Signed)
?[facility] ?Provider Note ? ? ?CSN: [id number] ?Arrival date & time: 12/10/21  5462 ? ?  ? ?History ? ?Chief Complaint  ?Patient presents with  ? Abdominal Pain  ? Back Pain  ? Chest Pain  ? ? ?Megan Keith is a 61 y.o. 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 ? ?Presents to the emergency department with a chief complaint of pain from her neck all the way down through her abdomen.  Patient states that pain has been there for the last week.  Pain has been constant and progressively worsening.  Patient describes the pain as a burning sensation and states "I feel really full like I am going to explode."  Patient states that she is having worsening pain with eating.  Patient has tried over-the-counter pain medication with no relief of symptoms.  Patient endorses chills, generalized weakness, fatigue, nausea, vomiting, shortness of breath, lightheadedness orthopnea, paroxysmal nocturnal dyspnea, palpitations, and dysuria. ? ?Patient states that her shortness of breath is worse with exertion and laying flat.  Patient did have some leg swelling last week however this improved after medication changes from her cardiologist.  Patient reports that she has not had any palpitations since Tuesday. ? ?Patient states that her dysuria started today. ? ?Patient denies any fever, abdominal distention, constipation, diarrhea, melena, blood in stool, hematuria, urinary urgency, vaginal pain, vaginal bleeding, vaginal discharge, syncope, numbness, weakness.   ? ? ? ? ?Abdominal Pain ?Associated symptoms: chest pain, chills, dysuria, fatigue and shortness of breath    ?Associated symptoms: no constipation, no cough, no diarrhea, no fever, no hematuria, no nausea, no vaginal bleeding, no vaginal discharge and no vomiting   ?Back Pain ?Associated symptoms: abdominal pain, chest pain and dysuria   ?Associated symptoms: no fever, no headaches, no numbness and no weakness   ?Chest Pain ?Associated symptoms: abdominal pain, back pain, fatigue, palpitations and shortness of breath   ?Associated symptoms: no cough, no dizziness, no dysphagia, no fever, no headache, no nausea, no numbness, no vomiting and no weakness   ? ?  ? ?Home Medications ?Prior to Admission medications   ?Medication Sig Start Date End Date Taking? Authorizing Provider  ?amiodarone (PACERONE) 200 MG tablet Take 200 mg by mouth 2 (two) times daily.    [provider]  ?anastrozole (ARIMIDEX) 1 MG tablet TAKE 1 TABLET BY MOUTH IN THE MORNING. PATIENT TAKES IT AT NIGHT. ?Patient taking differently: Take 1 mg by mouth every evening. 07/14/21   Melodye Ped, NP  ?apixaban (ELIQUIS) 5 MG TABS tablet Take 1 tablet (5 mg total) by mouth 2 (two) times daily. 10/28/21   Bensimhon, Shaune Pascal, MD  ?furosemide (LASIX) 40 MG tablet Take 40 mg by mouth daily.    [provider]  ?metoprolol succinate (TOPROL XL) 25 MG 24 hr tablet Take 0.5 tablets (12.5 mg total) by mouth at bedtime. ?Patient not taking: Reported on 12/02/2021 11/06/21   Rafael Bihari, FNP  ?mexiletine (MEXITIL) 200 MG capsule Take 1 capsule (200 mg total) by mouth 2 (two) times daily. 03/24/21   Rafael Bihari, FNP  ?   ? ?Allergies    ?Patient has no known allergies.   ? ?Review of Systems   ?Review of Systems  ?Constitutional:  Positive for chills and fatigue. Negative for fever.  ?HENT:  Negative for trouble swallowing.   ?Eyes:  Negative for visual disturbance.  ?Respiratory:  Positive for shortness of breath. Negative for cough.   ?Cardiovascular:  Positive for chest pain, palpitations and leg swelling.  ?Gastrointestinal:  Positive  for abdominal pain. Negative for abdominal distention, anal bleeding, blood in stool, constipation, diarrhea, nausea, rectal pain and vomiting.  ?Genitourinary:  Positive for dysuria. Negative for difficulty urinating, flank pain, frequency, hematuria, urgency, vaginal bleeding, vaginal discharge and vaginal pain.  ?Musculoskeletal:  Positive for back pain. Negative for neck pain.  ?Skin:  Negative for color change and rash.  ?Neurological:  Positive for light-headedness. Negative for dizziness, syncope, weakness, numbness and headaches.  ?Psychiatric/Behavioral:  Negative for confusion.   ? ?Physical Exam ?Updated Vital Signs ?BP 112/87 (BP Location: Right Arm)   Pulse (!) 117   Temp 98.2 ?F (36.8 ?C) (Oral)   Resp 18   SpO2 100%  ?Physical Exam ?Vitals and nursing note reviewed.  ?Constitutional:   ?   General: She is not in acute distress. ?   Appearance: She is not ill-appearing, toxic-appearing or diaphoretic.  ?HENT:  ?   Head: Normocephalic.  ?Eyes:  ?   General: No scleral icterus.    ?   Right eye: No discharge.     ?   Left eye: No discharge.  ?Cardiovascular:  ?   Rate and Rhythm: Normal rate.  ?   Pulses:     ?     Radial pulses are 1+ on the right side and 1+ on the left side.  ?Pulmonary:  ?   Effort: Pulmonary effort is normal. Tachypnea present. No bradypnea or respiratory distress.  ?   Breath sounds: Normal breath sounds. No stridor.  ?Abdominal:  ?   General: Abdomen is flat. Bowel sounds are normal. There is no distension. There are no signs of injury.  ?   Palpations: Abdomen is soft. There is no mass or pulsatile mass.  ?   Tenderness: There is generalized abdominal tenderness. There is guarding. There is no right CVA tenderness, left CVA tenderness or rebound.  ?   Hernia: There is no hernia in the umbilical area or ventral area.  ?   Comments: Generalized tenderness that patient is entire abdomen.  Tenderness is worse to upper abdomen with guarding to epigastric area  ?Musculoskeletal:  ?    Right lower leg: Normal. No swelling, deformity, lacerations, tenderness or bony tenderness. No edema.  ?   Left lower leg: Normal. No swelling, deformity, lacerations, tenderness or bony tenderness. No edema.  ?Skin: ?   General: Skin is warm and dry.  ?Neurological:  ?   General: No focal deficit present.  ?   Mental Status: She is alert.  ?   GCS: GCS eye subscore is 4. GCS verbal subscore is 5. GCS motor subscore is 6.  ?Psychiatric:     ?   Behavior: Behavior is cooperative.  ? ? ?ED Results / Procedures / Treatments   ?Labs ?(all labs ordered are listed, but only abnormal results are displayed) ?Labs Reviewed  ?COMPREHENSIVE METABOLIC PANEL - Abnormal; Notable for the following components:  ?    Result Value  ? Sodium 134 (*)   ? CO2 18 (*)   ? Glucose, Bld 123 (*)   ? Calcium 8.8 (*)   ? Total Protein 6.0 (*)   ? AST 43 (*)   ? All other components within  normal limits  ?CBC - Abnormal; Notable for the following components:  ? RDW 16.9 (*)   ? All other components within normal limits  ?BRAIN NATRIURETIC PEPTIDE - Abnormal; Notable for the following components:  ? B Natriuretic Peptide 1,571.6 (*)   ? All other components within normal limits  ?URINE CULTURE  ?LIPASE, BLOOD  ?URINALYSIS, ROUTINE W REFLEX MICROSCOPIC  ?TROPONIN I (HIGH SENSITIVITY)  ?TROPONIN I (HIGH SENSITIVITY)  ? ? ?EKG ?EKG Interpretation ? ?Date/Time:  Thursday December 10 2021 07:35:43 EDT ?Ventricular Rate:  117 ?PR Interval:  112 ?QRS Duration: 90 ?QT Interval:  382 ?QTC Calculation: 532 ?R Axis:   121 ?Text Interpretation: Sinus tachycardia Right axis deviation Low voltage QRS Cannot rule out Anterior infarct , age undetermined Prolonged QT Abnormal ECG No significant change since last tracing Confirmed by Isla Pence [phone]) on 12/10/2021 8:23:33 AM ? ?Radiology ?DG Chest 2 View ? ?Result Date: 12/10/2021 ?CLINICAL DATA:  Abdominal pain for the last week as well as increased shortness of breath and chest pain. EXAM: CHEST - 2 VIEW  COMPARISON:  Chest radiograph 03/04/2021 and earlier FINDINGS: Mildly enlarged cardiac silhouette, stable. Subsegmental atelectasis in the lung bases. Otherwise, no focal consolidation, pleural effusion,

## 2021-12-10 NOTE — Discharge Instructions (Addendum)
You came to the emergency department today to be evaluated for your pain.  Your lab results and imaging was reassuring.  Due to reports of abdominal pain I have given you information to follow-up with a GI doctor in the outpatient setting.  Please call to schedule a follow-up appointment.  Please follow-up closely with a cardiologist for further management of your heart failure. ? ? ?Please return to the [facility] apartment if you have any new or worsening symptoms. ? ?

## 2021-12-10 NOTE — ED Notes (Addendum)
Pt returned from nuc med, given food and drink per EDP. ?

## 2021-12-10 NOTE — ED Notes (Signed)
Gold top sent to lab ?

## 2021-12-10 NOTE — ED Triage Notes (Signed)
Pt here POV with reports of abdominal pain for the last week with gradual worsening. Pt also with increased shob. Pt also with chest pain. Main complaint is pt abdominal pain and back pain. Endorses nausea.  ?

## 2021-12-11 LAB — URINE CULTURE

## 2021-12-14 ENCOUNTER — Other Ambulatory Visit: Payer: Self-pay | Admitting: Hematology and Oncology

## 2021-12-14 DIAGNOSIS — Z170 Estrogen receptor positive status [ER+]: Secondary | ICD-10-CM

## 2021-12-14 MED ORDER — ANASTROZOLE 1 MG PO TABS: 1.0000 mg | ORAL_TABLET | Freq: Every evening | ORAL | 3 refills | Status: AC

## 2021-12-17 ENCOUNTER — Encounter: Payer: Self-pay | Admitting: Physician Assistant

## 2021-12-17 ENCOUNTER — Other Ambulatory Visit: Payer: Self-pay

## 2021-12-17 ENCOUNTER — Inpatient Hospital Stay (HOSPITAL_COMMUNITY)
Admission: EM | Admit: 2021-12-17 | Discharge: 2021-12-25 | DRG: 287 | Disposition: A | Discharge status: Home / Self Care | Payer: Self-pay | Attending: Internal Medicine | Admitting: Internal Medicine

## 2021-12-17 ENCOUNTER — Ambulatory Visit (INDEPENDENT_AMBULATORY_CARE_PROVIDER_SITE_OTHER): Payer: Self-pay | Admitting: Physician Assistant

## 2021-12-17 ENCOUNTER — Emergency Department (HOSPITAL_COMMUNITY): Payer: Self-pay

## 2021-12-17 VITALS — BP 100/72 | HR 102 | Ht <= 58 in | Wt 142.4 lb

## 2021-12-17 DIAGNOSIS — I480 Paroxysmal atrial fibrillation: Secondary | ICD-10-CM | POA: Diagnosis present

## 2021-12-17 DIAGNOSIS — E0590 Thyrotoxicosis, unspecified without thyrotoxic crisis or storm: Secondary | ICD-10-CM | POA: Diagnosis present

## 2021-12-17 DIAGNOSIS — R1013 Epigastric pain: Principal | ICD-10-CM

## 2021-12-17 DIAGNOSIS — I5023 Acute on chronic systolic (congestive) heart failure: Secondary | ICD-10-CM

## 2021-12-17 DIAGNOSIS — I081 Rheumatic disorders of both mitral and tricuspid valves: Secondary | ICD-10-CM | POA: Diagnosis present

## 2021-12-17 DIAGNOSIS — G8929 Other chronic pain: Secondary | ICD-10-CM | POA: Diagnosis present

## 2021-12-17 DIAGNOSIS — Z91148 Patient's other noncompliance with medication regimen for other reason: Secondary | ICD-10-CM

## 2021-12-17 DIAGNOSIS — I5043 Acute on chronic combined systolic (congestive) and diastolic (congestive) heart failure: Principal | ICD-10-CM | POA: Diagnosis present

## 2021-12-17 DIAGNOSIS — R0602 Shortness of breath: Secondary | ICD-10-CM

## 2021-12-17 DIAGNOSIS — E871 Hypo-osmolality and hyponatremia: Secondary | ICD-10-CM | POA: Diagnosis present

## 2021-12-17 DIAGNOSIS — Z20822 Contact with and (suspected) exposure to covid-19: Secondary | ICD-10-CM | POA: Diagnosis present

## 2021-12-17 DIAGNOSIS — R7989 Other specified abnormal findings of blood chemistry: Secondary | ICD-10-CM | POA: Diagnosis present

## 2021-12-17 DIAGNOSIS — R1010 Upper abdominal pain, unspecified: Secondary | ICD-10-CM

## 2021-12-17 DIAGNOSIS — I471 Supraventricular tachycardia: Secondary | ICD-10-CM | POA: Diagnosis present

## 2021-12-17 DIAGNOSIS — K761 Chronic passive congestion of liver: Secondary | ICD-10-CM | POA: Diagnosis present

## 2021-12-17 DIAGNOSIS — Z8616 Personal history of COVID-19: Secondary | ICD-10-CM

## 2021-12-17 DIAGNOSIS — Z8541 Personal history of malignant neoplasm of cervix uteri: Secondary | ICD-10-CM

## 2021-12-17 DIAGNOSIS — I440 Atrioventricular block, first degree: Secondary | ICD-10-CM | POA: Diagnosis present

## 2021-12-17 DIAGNOSIS — M25561 Pain in right knee: Secondary | ICD-10-CM | POA: Diagnosis present

## 2021-12-17 DIAGNOSIS — C539 Malignant neoplasm of cervix uteri, unspecified: Secondary | ICD-10-CM | POA: Diagnosis present

## 2021-12-17 DIAGNOSIS — M7502 Adhesive capsulitis of left shoulder: Secondary | ICD-10-CM | POA: Diagnosis present

## 2021-12-17 DIAGNOSIS — Z8249 Family history of ischemic heart disease and other diseases of the circulatory system: Secondary | ICD-10-CM

## 2021-12-17 DIAGNOSIS — I959 Hypotension, unspecified: Secondary | ICD-10-CM | POA: Diagnosis present

## 2021-12-17 DIAGNOSIS — Z8701 Personal history of pneumonia (recurrent): Secondary | ICD-10-CM

## 2021-12-17 DIAGNOSIS — Z923 Personal history of irradiation: Secondary | ICD-10-CM

## 2021-12-17 DIAGNOSIS — R079 Chest pain, unspecified: Secondary | ICD-10-CM

## 2021-12-17 DIAGNOSIS — Z853 Personal history of malignant neoplasm of breast: Secondary | ICD-10-CM

## 2021-12-17 DIAGNOSIS — E0580 Other thyrotoxicosis without thyrotoxic crisis or storm: Secondary | ICD-10-CM | POA: Diagnosis present

## 2021-12-17 DIAGNOSIS — M19041 Primary osteoarthritis, right hand: Secondary | ICD-10-CM | POA: Diagnosis present

## 2021-12-17 DIAGNOSIS — M19042 Primary osteoarthritis, left hand: Secondary | ICD-10-CM | POA: Diagnosis present

## 2021-12-17 DIAGNOSIS — Z90710 Acquired absence of both cervix and uterus: Secondary | ICD-10-CM

## 2021-12-17 DIAGNOSIS — K30 Functional dyspepsia: Secondary | ICD-10-CM | POA: Diagnosis not present

## 2021-12-17 DIAGNOSIS — N179 Acute kidney failure, unspecified: Secondary | ICD-10-CM | POA: Diagnosis present

## 2021-12-17 DIAGNOSIS — Z9012 Acquired absence of left breast and nipple: Secondary | ICD-10-CM

## 2021-12-17 DIAGNOSIS — M25562 Pain in left knee: Secondary | ICD-10-CM | POA: Diagnosis present

## 2021-12-17 DIAGNOSIS — F419 Anxiety disorder, unspecified: Secondary | ICD-10-CM | POA: Diagnosis present

## 2021-12-17 DIAGNOSIS — T462X5A Adverse effect of other antidysrhythmic drugs, initial encounter: Secondary | ICD-10-CM | POA: Diagnosis present

## 2021-12-17 DIAGNOSIS — Z8261 Family history of arthritis: Secondary | ICD-10-CM

## 2021-12-17 DIAGNOSIS — I2722 Pulmonary hypertension due to left heart disease: Secondary | ICD-10-CM | POA: Diagnosis present

## 2021-12-17 DIAGNOSIS — I428 Other cardiomyopathies: Secondary | ICD-10-CM | POA: Diagnosis present

## 2021-12-17 DIAGNOSIS — I5082 Biventricular heart failure: Secondary | ICD-10-CM | POA: Diagnosis present

## 2021-12-17 DIAGNOSIS — I509 Heart failure, unspecified: Secondary | ICD-10-CM

## 2021-12-17 DIAGNOSIS — E876 Hypokalemia: Secondary | ICD-10-CM | POA: Diagnosis present

## 2021-12-17 DIAGNOSIS — M170 Bilateral primary osteoarthritis of knee: Secondary | ICD-10-CM | POA: Diagnosis present

## 2021-12-17 DIAGNOSIS — I4819 Other persistent atrial fibrillation: Secondary | ICD-10-CM | POA: Diagnosis present

## 2021-12-17 DIAGNOSIS — R278 Other lack of coordination: Secondary | ICD-10-CM | POA: Diagnosis not present

## 2021-12-17 DIAGNOSIS — I472 Ventricular tachycardia, unspecified: Secondary | ICD-10-CM | POA: Diagnosis present

## 2021-12-17 DIAGNOSIS — Z7901 Long term (current) use of anticoagulants: Secondary | ICD-10-CM

## 2021-12-17 DIAGNOSIS — Z79899 Other long term (current) drug therapy: Secondary | ICD-10-CM

## 2021-12-17 DIAGNOSIS — F32A Depression, unspecified: Secondary | ICD-10-CM | POA: Diagnosis present

## 2021-12-17 DIAGNOSIS — D649 Anemia, unspecified: Secondary | ICD-10-CM | POA: Diagnosis present

## 2021-12-17 DIAGNOSIS — K219 Gastro-esophageal reflux disease without esophagitis: Secondary | ICD-10-CM | POA: Diagnosis present

## 2021-12-17 DIAGNOSIS — Z79811 Long term (current) use of aromatase inhibitors: Secondary | ICD-10-CM

## 2021-12-17 DIAGNOSIS — E8770 Fluid overload, unspecified: Secondary | ICD-10-CM

## 2021-12-17 LAB — URINALYSIS, ROUTINE W REFLEX MICROSCOPIC
Bilirubin Urine: NEGATIVE
Glucose, UA: NEGATIVE mg/dL
Hgb urine dipstick: NEGATIVE
Ketones, ur: 5 mg/dL — AB
Leukocytes,Ua: NEGATIVE
Nitrite: NEGATIVE
Protein, ur: NEGATIVE mg/dL
Specific Gravity, Urine: 1.008 (ref 1.005–1.030)
pH: 6 (ref 5.0–8.0)

## 2021-12-17 LAB — COMPREHENSIVE METABOLIC PANEL
ALT: 120 U/L — ABNORMAL HIGH (ref 0–44)
AST: 109 U/L — ABNORMAL HIGH (ref 15–41)
Albumin: 3.5 g/dL (ref 3.5–5.0)
Alkaline Phosphatase: 194 U/L — ABNORMAL HIGH (ref 38–126)
Anion gap: 12 (ref 5–15)
BUN: 13 mg/dL (ref 8–23)
CO2: 21 mmol/L — ABNORMAL LOW (ref 22–32)
Calcium: 9.2 mg/dL (ref 8.9–10.3)
Chloride: 104 mmol/L (ref 98–111)
Creatinine, Ser: 0.93 mg/dL (ref 0.44–1.00)
GFR, Estimated: >60 mL/min (ref >60)
Glucose, Bld: 119 mg/dL — ABNORMAL HIGH (ref 70–99)
Potassium: 3.9 mmol/L (ref 3.5–5.1)
Sodium: 137 mmol/L (ref 135–145)
Total Bilirubin: 1.6 mg/dL — ABNORMAL HIGH (ref 0.3–1.2)
Total Protein: 5.9 g/dL — ABNORMAL LOW (ref 6.5–8.1)

## 2021-12-17 LAB — CBC
HCT: 36.5 % (ref 36.0–46.0)
Hemoglobin: 11.5 g/dL — ABNORMAL LOW (ref 12.0–15.0)
MCH: 28.7 pg (ref 26.0–34.0)
MCHC: 31.5 g/dL (ref 30.0–36.0)
MCV: 91 fL (ref 80.0–100.0)
Platelets: 246 10*3/uL (ref 150–400)
RBC: 4.01 MIL/uL (ref 3.87–5.11)
RDW: 16.9 % — ABNORMAL HIGH (ref 11.5–15.5)
WBC: 6.4 10*3/uL (ref 4.0–10.5)
nRBC: 0 % (ref 0.0–0.2)

## 2021-12-17 LAB — RESP PANEL BY RT-PCR (FLU A&B, COVID) ARPGX2
Influenza A by PCR: NEGATIVE
Influenza B by PCR: NEGATIVE
SARS Coronavirus 2 by RT PCR: NEGATIVE

## 2021-12-17 LAB — TROPONIN I (HIGH SENSITIVITY): Troponin I (High Sensitivity): 13 ng/L (ref <18)

## 2021-12-17 LAB — MAGNESIUM: Magnesium: 2.1 mg/dL (ref 1.7–2.4)

## 2021-12-17 LAB — LIPASE, BLOOD: Lipase: 34 U/L (ref 11–51)

## 2021-12-17 LAB — BRAIN NATRIURETIC PEPTIDE: B Natriuretic Peptide: 1840.6 pg/mL — ABNORMAL HIGH (ref 0.0–100.0)

## 2021-12-17 LAB — LACTIC ACID, PLASMA: Lactic Acid, Venous: 1.5 mmol/L (ref 0.5–1.9)

## 2021-12-17 MED ORDER — FUROSEMIDE 10 MG/ML IJ SOLN
80.0000 mg | Freq: Once | INTRAMUSCULAR | Status: AC
  Administered 2021-12-18: 80 mg via INTRAVENOUS
  Filled 2021-12-17: qty 8

## 2021-12-17 MED ORDER — ONDANSETRON 4 MG PO TBDP
4.0000 mg | ORAL_TABLET | Freq: Once | ORAL | Status: AC
Start: 2021-12-17 — End: 2021-12-17
  Administered 2021-12-17: 4 mg via ORAL
  Filled 2021-12-17: qty 1

## 2021-12-17 MED ORDER — OXYCODONE-ACETAMINOPHEN 5-325 MG PO TABS
1.0000 | ORAL_TABLET | Freq: Once | ORAL | Status: AC
  Administered 2021-12-17: 1 via ORAL
  Filled 2021-12-17: qty 1

## 2021-12-17 NOTE — Progress Notes (Signed)
? ?Subjective:  ? ? Patient ID: Megan Keith, female    DOB: 1960/11/14, 61 y.o.   MRN: [id number] ? ?HPI ?Megan Keith is a 61 year old Hispanic female, non-English-speaking, new to GI today, accompanied by her daughter who does speak some English and an interpreter.  Patient is referred to GI after recent ER visit on 12/10/2021, when she presented with chest pain and upper abdominal pain, and shortness of breath.  She underwent work-up with on and to rule out acute coronary syndrome, and CT angiography of the chest and then CT of the abdomen pelvis.  This showed no evidence for pulmonary embolism, marked cardiomegaly with signs of heart failure with trace effusions and pulmonary edema, reflux of contrast into the hepatic veins underscoring signs of cardiac dysfunction, heterogeneous enhancement of the liver possibly secondary to congestive hepatopathy, gallbladder wall thickening and some pericholecystic stranding and edema, which can also be seen in setting of congestive hepatopathy, suggested HIDA to rule out cholecystitis, trace ascites, small hiatal hernia. ?HIDA scan was done and negative with good filling of gallbladder ?Labs with normal CBC, LFTs normal with the exception of AST of 43 ?CO2 18/BUN 13/creatinine 0.89 ?BNP 1571.6 ?Patient had been given a PPI, had endorsed feeling a bit better and was discharged to home with GI follow-up ? ?Patient has history of severe heart failure and is followed by the heart failure clinic/Dr. Bensimhon.  Most recent echo done February 2023 showed EF of about 20% ?Also with history of atrial fibrillation, and atrial tachycardia. She had been slated to undergo cardioversion about a month ago however was in sinus rhythm on the day of admission so that was canceled. 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 ? ?Patient and her daughter states that she has  been feeling horrible over the past couple of weeks and worse over the past week.  She says she is unable to sleep at all.  She is short of breath with any exertion and short of breath with talking.  She says she feels very tired and weak and very heavy "" with standing and feels like she will fall down when she tries to walk.  She complains of chest back and abdominal discomfort worse with coughing.  She also feels bloated and uncomfortable in her abdomen.  She has been taking the omeprazole prescribed in the ER without any benefit at home.  No melena or hematochezia, occasional loose stools.  She has been taking all of her medications as prescribed. ? ?Weight is stable today at 142 ? ?Review of Systems Pertinent positive and negative review of systems were noted in the above HPI section.  All other review of systems was otherwise negative.  ? ?Outpatient Encounter Medications as of 12/17/2021  ?Medication Sig  ? amiodarone (PACERONE) 200 MG tablet Take 200 mg by mouth 2 (two) times daily.  ? anastrozole (ARIMIDEX) 1 MG tablet Take 1 tablet (1 mg total) by mouth every evening.  ? apixaban (ELIQUIS) 5 MG TABS tablet Take 1 tablet (5 mg total) by mouth 2 (two) times daily.  ? Cyanocobalamin (B-12) 3000 MCG LOZG Take 1 tablet by mouth daily.  ? furosemide (LASIX) 40 MG tablet Take 40 mg by mouth daily.  ? metoprolol succinate (TOPROL-XL) 25 MG 24 hr tablet Take 12.5 mg by mouth at bedtime.  ? mexiletine (MEXITIL) 200 MG capsule Take 1 capsule (200 mg total) by mouth 2 (two) times daily.  ?  omeprazole (PRILOSEC) 20 MG capsule Take 20 mg by mouth daily.  ? ?No facility-administered encounter medications on file as of 12/17/2021.  ? ?No Known Allergies ?Patient Active Problem List  ? Diagnosis Date Noted  ? Vaginal itching 08/03/2021  ? Osteoporosis 08/07/2020  ? Persistent atrial fibrillation ([place]) 08/06/2019  ? COVID-19 06/06/2019  ? Chronic systolic CHF (congestive heart failure) ([place])   ? Atrial fibrillation with RVR  ([place]) 03/05/2019  ? Hypotension 03/05/2019  ? 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 GERD (gastroesophageal reflux disease) 02/05/2018  ? Breast cancer ([place]) 02/05/2018  ? Headache 02/05/2018  ? Nausea & vomiting 02/05/2018  ? Epigastric abdominal pain 02/05/2018  ? Sore throat 02/05/2018  ? Frequent PVCs   ? DCM (dilated cardiomyopathy) ([place])   ? Acute on chronic HFrEF (heart failure with reduced ejection fraction) ([place]) 04/15/2017  ? 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 ?Social History  ? ?Socioeconomic History  ? Marital status: Single  ?  Spouse name: Not on file  ? Number of children: 4  ? Years of education: Not on file  ? Highest education level: Not on file  ?Occupational History  ? Not on file  ?Tobacco Use  ? Smoking status: Never  ? Smokeless tobacco: Never  ?Vaping Use  ? Vaping Use: Never used  ?Substance and Sexual Activity  ? Alcohol use: Not Currently  ?  Comment: 04/15/2017 "might have a couple beers q couple months"  ? Drug use: Not Currently  ? Sexual activity: Not Currently  ?Other Topics Concern  ? Not on file  ?Social History Narrative  ? ** Merged History Encounter **  ?    ? N/A  ? ?Social Determinants of Health  ? ?Financial Resource Strain: Not on file  ?Food Insecurity: Not on file  ?Transportation Needs: Not on file  ?Physical Activity: Not on file  ?Stress: Not on file  ?Social Connections: Not on file  ?Intimate Partner Violence: Not on file  ? ? ?Megan Keith's family history includes Arthritis in her mother and sister; Diabetes in her father; Hypertension in her mother. ? ? ?   ?Objective:  ?  ?Vitals:  ? 12/17/21 1426  ?BP: 100/72  ?Pulse: (!) 102  ? ? ?Physical Exam Well-developed well-nourished older hispanic female dyspneic with speaking, height, Weight, 142 BMI 30.8 ? ?HEENT; nontraumatic normocephalic, EOMI, PE R LA, sclera anicteric. ?Oropharynx; not examined today ?Neck; supple, JVD to jawline ?Cardiovascular; ir regular rate and rhythm  with B0-F7, systolic murmur ?Pulmonary; decreased breath sounds bilateral bases, few fine Rales ?Abdomen; soft, nondistended, no fluid wave no palpable mass or hepatosplenomegaly, bowel sounds are active, there is some tenderness across the upper abdomen, no guarding ?Rectal; not done today ?Skin; benign exam, no jaundice rash or appreciable lesions ?Extremities; 1+ edema bilateral lower extremities ?Neuro/Psych; alert and oriented x4, grossly nonfocal mood and affect appropriate  ? ? ? ?   ?Assessment & Plan:  ? 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 ? ?ER visit 1 week ago with complaints of chest pain abdominal pain shortness of breath. 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 ? ?She was given a PPI and apparently  had some temporary improvement in symptoms, was allowed discharged to home but has been feeling horrible over the past couple of weeks, very fatigued, dyspneic with any exertion including talking, unable to rest due to symptoms, complains of back chest and upper abdominal discomfort. ? ?I do not think that her symptoms are of GI etiology, symptoms consistent with acute on chronic severe congestive heart failure. ?Intermittent atrial fibrillation/atrial tachycardia-heart rate 120s here intermittently irregular ? ?#2 history of breast cancer status post XRT ?#3 history of cervical cancer status post hysterectomy ? ?Plan; patient and daughter advised to proceed to the emergency room at [facility] for further evaluation, treatment and admission for congestive heart failure. ? ? ? ?Reyes Aldaco S  Daiki Dicostanzo PA-C ?12/17/2021 ? ? ?Cc: Cathleen Corti, FNP ?  ?

## 2021-12-17 NOTE — ED Provider Triage Note (Signed)
Emergency Medicine Provider Triage Evaluation Note ? ?Paulla Mcclaskey , a 61 y.o. female  was evaluated in triage.  Pt complains of abdominal pain, nausea, vomiting, was told she had fluid around kidneys by GI doctor. Hx of previous hysterectomy. Also endorses shortness of breath, weakness, "pain all over". No chest pain.  ? ?Review of Systems  ?Positive: Shob, abdominal pain, NVD ?Negative: Dysuria, hematuria, chest pain ? ?Physical Exam  ?BP 99/73   Pulse (!) 106   Temp 98.2 ?F (36.8 ?C) (Oral)   Resp 18   SpO2 100%  ?Gen:   Awake, ill appearing ?Resp:  Decreased effort, occasional expiratory wheeze. ?MSK:   Moves extremities without difficulty  ?Other:  TTP throughout abdomen. 3/6 murmur noted on cardiac auscultation. ? ?Medical Decision Making  ?Medically screening exam initiated at 4:18 PM.  Appropriate orders placed.  Lukisha Procida was informed that the remainder of the evaluation will be completed by another provider, this initial triage assessment does not replace that evaluation, and the importance of remaining in the ED until their evaluation is complete. ? ?Workup initiated ?  ?Anselmo Pickler, PA-C ?12/17/21 1620 ? ?

## 2021-12-17 NOTE — ED Notes (Signed)
Patient repositioned and appropriately sized cuff placed on patients arm and BP recycled ?

## 2021-12-17 NOTE — ED Provider Notes (Signed)
?[facility] ?Provider Note ? ? ?CSN: [id number] ?Arrival date & time: 12/17/21  1540 ? ?  ? ?History ? ?Chief Complaint  ?Patient presents with  ? Abdominal Pain  ? ? ?Megan Keith is a 61 y.o. female. ? ?The history is provided by the patient and a relative. The history is limited by a language barrier. A language interpreter was used.  ?Abdominal Pain ?Pain location:  Epigastric and RUQ ?Pain quality: aching   ?Pain radiates to:  Chest ?Pain severity:  Moderate ?Onset quality:  Gradual ?Timing:  Intermittent ?Progression:  Unchanged ?Chronicity:  New ?Relieved by:  Nothing ?Worsened by:  Eating ?Ineffective treatments:  None tried ?Associated symptoms: chest pain, fatigue, nausea and shortness of breath   ?Associated symptoms: no chills, no constipation, no cough, no diarrhea, no dysuria, no fever and no flatus   ? ?  ? ?Home Medications ?Prior to Admission medications   ?Medication Sig Start Date End Date Taking? Authorizing Provider  ?amiodarone (PACERONE) 200 MG tablet Take 200 mg by mouth 2 (two) times daily.    [provider]  ?anastrozole (ARIMIDEX) 1 MG tablet Take 1 tablet (1 mg total) by mouth every evening. 12/14/21   Mosher, Vida Roller A, PA-C  ?apixaban (ELIQUIS) 5 MG TABS tablet Take 1 tablet (5 mg total) by mouth 2 (two) times daily. 10/28/21   Bensimhon, Shaune Pascal, MD  ?Cyanocobalamin (B-12) 3000 MCG LOZG Take 1 tablet by mouth daily.    [provider]  ?furosemide (LASIX) 40 MG tablet Take 40 mg by mouth daily.    [provider]  ?metoprolol succinate (TOPROL-XL) 25 MG 24 hr tablet Take 12.5 mg by mouth at bedtime.    [provider]  ?mexiletine (MEXITIL) 200 MG capsule Take 1 capsule (200 mg total) by mouth 2 (two) times daily. 03/24/21   Rafael Bihari, FNP  ?omeprazole (PRILOSEC) 20 MG capsule Take 20 mg by mouth daily.    [provider]  ?   ? ?Allergies    ?Patient has no known allergies.   ? ?Review of  Systems   ?Review of Systems  ?Constitutional:  Positive for fatigue. Negative for chills, diaphoresis and fever.  ?HENT:  Negative for congestion.   ?Respiratory:  Positive for shortness of breath. Negative for cough, chest tightness and wheezing.   ?Cardiovascular:  Positive for chest pain and leg swelling (chronic). Negative for palpitations.  ?Gastrointestinal:  Positive for abdominal pain and nausea. Negative for abdominal distention, constipation, diarrhea and flatus.  ?Genitourinary:  Negative for dysuria and flank pain.  ?Musculoskeletal:  Negative for back pain, neck pain and neck stiffness.  ?Skin:  Negative for rash and wound.  ?Neurological:  Negative for dizziness, weakness, light-headedness and headaches.  ?Psychiatric/Behavioral:  Negative for agitation and confusion.   ?All other systems reviewed and are negative. ? ?Physical Exam ?Updated Vital Signs ?BP 99/73   Pulse (!) 106   Temp 98.2 ?F (36.8 ?C) (Oral)   Resp 18   SpO2 100%  ?Physical Exam ?Vitals and nursing note reviewed.  ?Constitutional:   ?   General: She is not in acute distress. ?   Appearance: She is well-developed. She is not ill-appearing, toxic-appearing or diaphoretic.  ?HENT:  ?   Head: Normocephalic and atraumatic.  ?   Mouth/Throat:  ?   Mouth: Mucous membranes are moist.  ?   Pharynx: No oropharyngeal exudate or posterior oropharyngeal erythema.  ?Eyes:  ?   General: No scleral  icterus. ?   Extraocular Movements: Extraocular movements intact.  ?   Conjunctiva/sclera: Conjunctivae normal.  ?   Pupils: Pupils are equal, round, and reactive to light.  ?Cardiovascular:  ?   Rate and Rhythm: Normal rate and regular rhythm.  ?   Heart sounds: No murmur heard. ?Pulmonary:  ?   Effort: Pulmonary effort is normal. No respiratory distress.  ?   Breath sounds: Rales present. No wheezing or rhonchi.  ?Chest:  ?   Chest wall: No tenderness.  ?Abdominal:  ?   General: Abdomen is flat. Bowel sounds are normal. There is no distension.  ?    Palpations: Abdomen is soft.  ?   Tenderness: There is abdominal tenderness in the right upper quadrant and epigastric area. There is no right CVA tenderness, left CVA tenderness, guarding or rebound.  ?Musculoskeletal:     ?   General: No swelling.  ?   Cervical back: Neck supple.  ?   Right lower leg: Edema present.  ?   Left lower leg: Edema present.  ?Skin: ?   General: Skin is warm and dry.  ?   Capillary Refill: Capillary refill takes less than 2 seconds.  ?   Findings: No erythema or rash.  ?Neurological:  ?   General: No focal deficit present.  ?   Mental Status: She is alert.  ?Psychiatric:     ?   Mood and Affect: Mood normal.  ? ? ?ED Results / Procedures / Treatments   ?Labs ?(all labs ordered are listed, but only abnormal results are displayed) ?Labs Reviewed  ?CBC - Abnormal; Notable for the following components:  ?    Result Value  ? Hemoglobin 11.5 (*)   ? RDW 16.9 (*)   ? All other components within normal limits  ?COMPREHENSIVE METABOLIC PANEL - Abnormal; Notable for the following components:  ? CO2 21 (*)   ? Glucose, Bld 119 (*)   ? Total Protein 5.9 (*)   ? AST 109 (*)   ? ALT 120 (*)   ? Alkaline Phosphatase 194 (*)   ? Total Bilirubin 1.6 (*)   ? All other components within normal limits  ?URINALYSIS, ROUTINE W REFLEX MICROSCOPIC - Abnormal; Notable for the following components:  ? Ketones, ur 5 (*)   ? All other components within normal limits  ?BRAIN NATRIURETIC PEPTIDE - Abnormal; Notable for the following components:  ? B Natriuretic Peptide 1,840.6 (*)   ? All other components within normal limits  ?RESP PANEL BY RT-PCR (FLU A&B, COVID) ARPGX2  ?URINE CULTURE  ?LIPASE, BLOOD  ?LACTIC ACID, PLASMA  ?MAGNESIUM  ?LACTIC ACID, PLASMA  ?HEPATITIS PANEL, ACUTE  ?TROPONIN I (HIGH SENSITIVITY)  ?TROPONIN I (HIGH SENSITIVITY)  ? ? ?EKG ?EKG Interpretation ? ?Date/Time:  Thursday December 17 2021 17:24:17 EDT ?Ventricular Rate:  90 ?PR Interval:  210 ?QRS Duration: 85 ?QT Interval:  406 ?QTC  Calculation: 497 ?R Axis:   12 ?Text Interpretation: Sinus rhythm Probable left atrial enlargement Low voltage, precordial leads Nonspecific T abnormalities, lateral leads Borderline prolonged QT interval when compared to prior, similar appearance. no STEMI Confirmed by Antony Blackbird [phone]) on 12/17/2021 5:25:46 PM ? ?Radiology ?DG Chest 2 View ? ?Result Date: 12/17/2021 ?CLINICAL DATA:  Shortness of breath EXAM: CHEST - 2 VIEW COMPARISON:  Correlation is made to CT of the chest dated December 10, 2021 and chest x-ray dated March 04, 2021 FINDINGS: There is moderate cardiomegaly. There is mild pulmonary vascular congestion. No focal  consolidation or pleural effusion seen. Bony thorax is unremarkable. IMPRESSION: Moderate cardiomegaly and mild pulmonary vascular congestion Electronically Signed   By: Frazier Richards M.D.   On: 12/17/2021 16:34  ? ?US Abdomen Limited RUQ (LIVER/GB) ? ?Result Date: 12/17/2021 ?CLINICAL DATA:  Epigastric pain EXAM: ULTRASOUND ABDOMEN LIMITED RIGHT UPPER QUADRANT COMPARISON:  None. FINDINGS: Gallbladder: No gallstones or wall thickening visualized. No sonographic Murphy sign noted by sonographer. Common bile duct: Diameter: Normal caliber, 5 mm Liver: Heterogeneous echotexture throughout the liver. Subtle nodularity to the liver surface suggests the possibility of cirrhosis. No focal hepatic abnormality. Portal vein is patent on color Doppler imaging with normal direction of blood flow towards the liver. Other: None. IMPRESSION: No cholelithiasis or acute cholecystitis. Heterogeneous echotexture and subtle nodularity to the liver surface suggests the possibility of cirrhosis. Recommend clinical correlation. Electronically Signed   By: Rolm Baptise M.D.   On: 12/17/2021 20:00   ? ?Procedures ?Procedures  ? ? ?Medications Ordered in ED ?Medications  ?furosemide (LASIX) injection 80 mg (has no administration in time range)  ?oxyCODONE-acetaminophen (PERCOCET/ROXICET) 5-325 MG per tablet 1 tablet  (1 tablet Oral Given 12/17/21 1622)  ?ondansetron (ZOFRAN-ODT) disintegrating tablet 4 mg (4 mg Oral Given 12/17/21 1623)  ? ? ?ED Course/ Medical Decision Making/ A&P ?  ?                        ?Medical Decision M

## 2021-12-17 NOTE — ED Triage Notes (Signed)
Pt reports abdominal pain and pain "in kidneys"  Family states she went to GI doc and was told to come to the ED for "a lot of fluid on her kidneys"   Pt endorses shob as well and fatigue with any activity.  ?

## 2021-12-18 ENCOUNTER — Encounter (HOSPITAL_COMMUNITY): Payer: Self-pay | Admitting: Internal Medicine

## 2021-12-18 DIAGNOSIS — I5023 Acute on chronic systolic (congestive) heart failure: Secondary | ICD-10-CM

## 2021-12-18 DIAGNOSIS — I509 Heart failure, unspecified: Secondary | ICD-10-CM

## 2021-12-18 LAB — CBC WITH DIFFERENTIAL/PLATELET
Abs Immature Granulocytes: 0.02 10*3/uL (ref 0.00–0.07)
Basophils Absolute: 0.1 10*3/uL (ref 0.0–0.1)
Basophils Relative: 1 %
Eosinophils Absolute: 0.1 10*3/uL (ref 0.0–0.5)
Eosinophils Relative: 2 %
HCT: 35.4 % — ABNORMAL LOW (ref 36.0–46.0)
Hemoglobin: 11.4 g/dL — ABNORMAL LOW (ref 12.0–15.0)
Immature Granulocytes: 0 %
Lymphocytes Relative: 9 %
Lymphs Abs: 0.5 10*3/uL — ABNORMAL LOW (ref 0.7–4.0)
MCH: 28.8 pg (ref 26.0–34.0)
MCHC: 32.2 g/dL (ref 30.0–36.0)
MCV: 89.4 fL (ref 80.0–100.0)
Monocytes Absolute: 0.5 10*3/uL (ref 0.1–1.0)
Monocytes Relative: 8 %
Neutro Abs: 4.6 10*3/uL (ref 1.7–7.7)
Neutrophils Relative %: 80 %
Platelets: 266 10*3/uL (ref 150–400)
RBC: 3.96 MIL/uL (ref 3.87–5.11)
RDW: 17 % — ABNORMAL HIGH (ref 11.5–15.5)
WBC: 5.7 10*3/uL (ref 4.0–10.5)
nRBC: 0 % (ref 0.0–0.2)

## 2021-12-18 LAB — HEPATITIS PANEL, ACUTE
HCV Ab: NONREACTIVE
Hep A IgM: NONREACTIVE
Hep B C IgM: NONREACTIVE
Hepatitis B Surface Ag: NONREACTIVE

## 2021-12-18 LAB — HEPATIC FUNCTION PANEL
ALT: 111 U/L — ABNORMAL HIGH (ref 0–44)
AST: 99 U/L — ABNORMAL HIGH (ref 15–41)
Albumin: 3.4 g/dL — ABNORMAL LOW (ref 3.5–5.0)
Alkaline Phosphatase: 172 U/L — ABNORMAL HIGH (ref 38–126)
Bilirubin, Direct: 0.4 mg/dL — ABNORMAL HIGH (ref 0.0–0.2)
Indirect Bilirubin: 1.2 mg/dL — ABNORMAL HIGH (ref 0.3–0.9)
Total Bilirubin: 1.6 mg/dL — ABNORMAL HIGH (ref 0.3–1.2)
Total Protein: 5.8 g/dL — ABNORMAL LOW (ref 6.5–8.1)

## 2021-12-18 LAB — BASIC METABOLIC PANEL
Anion gap: 10 (ref 5–15)
BUN: 13 mg/dL (ref 8–23)
CO2: 22 mmol/L (ref 22–32)
Calcium: 8.6 mg/dL — ABNORMAL LOW (ref 8.9–10.3)
Chloride: 102 mmol/L (ref 98–111)
Creatinine, Ser: 0.99 mg/dL (ref 0.44–1.00)
GFR, Estimated: >60 mL/min (ref >60)
Glucose, Bld: 97 mg/dL (ref 70–99)
Potassium: 3.6 mmol/L (ref 3.5–5.1)
Sodium: 134 mmol/L — ABNORMAL LOW (ref 135–145)

## 2021-12-18 LAB — TSH: TSH: 0.555 u[IU]/mL (ref 0.350–4.500)

## 2021-12-18 LAB — HIV ANTIBODY (ROUTINE TESTING W REFLEX): HIV Screen 4th Generation wRfx: NONREACTIVE

## 2021-12-18 MED ORDER — ANASTROZOLE 1 MG PO TABS
1.0000 mg | ORAL_TABLET | Freq: Every evening | ORAL | Status: DC
  Administered 2021-12-18 – 2021-12-24 (×7): 1 mg via ORAL
  Filled 2021-12-18 (×8): qty 1

## 2021-12-18 MED ORDER — FUROSEMIDE 10 MG/ML IJ SOLN
80.0000 mg | Freq: Two times a day (BID) | INTRAMUSCULAR | Status: DC
  Administered 2021-12-18 – 2021-12-20 (×4): 80 mg via INTRAVENOUS
  Filled 2021-12-18 (×4): qty 8

## 2021-12-18 MED ORDER — APIXABAN 5 MG PO TABS
5.0000 mg | ORAL_TABLET | Freq: Two times a day (BID) | ORAL | Status: DC
Start: 2021-12-18 — End: 2021-12-25
  Administered 2021-12-18 – 2021-12-25 (×14): 5 mg via ORAL
  Filled 2021-12-18 (×15): qty 1

## 2021-12-18 MED ORDER — AMIODARONE HCL 200 MG PO TABS
200.0000 mg | ORAL_TABLET | Freq: Two times a day (BID) | ORAL | Status: DC
  Administered 2021-12-18 – 2021-12-25 (×15): 200 mg via ORAL
  Filled 2021-12-18 (×16): qty 1

## 2021-12-18 MED ORDER — FUROSEMIDE 10 MG/ML IJ SOLN: 40.0000 mg | Freq: Two times a day (BID) | INTRAMUSCULAR | Status: DC

## 2021-12-18 MED ORDER — GUAIFENESIN-DM 100-10 MG/5ML PO SYRP
5.0000 mL | ORAL_SOLUTION | ORAL | Status: DC | PRN
  Administered 2021-12-18: 5 mL via ORAL
  Filled 2021-12-18: qty 5

## 2021-12-18 MED ORDER — MEXILETINE HCL 200 MG PO CAPS
200.0000 mg | ORAL_CAPSULE | Freq: Two times a day (BID) | ORAL | Status: DC
  Administered 2021-12-18 – 2021-12-23 (×10): 200 mg via ORAL
  Filled 2021-12-18 (×10): qty 1

## 2021-12-18 MED ORDER — PANTOPRAZOLE SODIUM 40 MG PO TBEC
40.0000 mg | DELAYED_RELEASE_TABLET | Freq: Every day | ORAL | Status: DC
  Administered 2021-12-18 – 2021-12-25 (×8): 40 mg via ORAL
  Filled 2021-12-18 (×8): qty 1

## 2021-12-18 MED ORDER — METOPROLOL SUCCINATE ER 25 MG PO TB24: 12.5000 mg | ORAL_TABLET | Freq: Every day | ORAL | Status: DC

## 2021-12-18 NOTE — Progress Notes (Addendum)
? ? Advanced Heart Failure Rounding Note ? ?PCP-Cardiologist: Glori Bickers, MD  ? ?Subjective:   ? ?Not feeling well today. Short of breath and notes abdominal discomfort.  ? ?Is/Os inaccurate. 2L UOP charted yesterday, none today.  ? ?Scr 0.99, K 3.6 ? ?LFTs elevated ? ?Objective:   ?Weight Range: ?61.3 kg ?Body mass index is 29.24 kg/m?.  ? ?Vital Signs:   ?Temp:  [98 ?F (36.7 ?C)-98.4 ?F (36.9 ?C)] 98.4 ?F (36.9 ?C) (04/28 1158) ?Pulse Rate:  [77-106] 99 (04/28 1158) ?Resp:  [15-27] 16 (04/28 1158) ?BP: (82-155)/(53-93) 95/71 (04/28 1158) ?SpO2:  [91 %-100 %] 97 % (04/28 1158) ?Weight:  [61.3 kg] 61.3 kg (04/28 1158) ?  ? ?Weight change: ?Filed Weights  ? 12/18/21 1158  ?Weight: 61.3 kg  ? ? ?Intake/Output:  ? ?Intake/Output Summary (Last 24 hours) at 12/18/2021 1321 ?Last data filed at 12/18/2021 0600 ?Gross per 24 hour  ?Intake --  ?Output 2000 ml  ?Net -2000 ml  ?  ? ? ?Physical Exam  ?  ?General:  No distress. Sitting up in bed. ?HEENT: Normal ?Neck: Supple. JVP to ear. Carotids 2+ bilat; no bruits.  ?Cor: PMI nondisplaced. Regular rate & rhythm. No rubs, gallops, + TR murmur ?Lungs: Clear ?Abdomen: Soft, nontender, nondistended.  ?Extremities: No cyanosis, clubbing, rash, edema ?Neuro: Alert & orientedx3, cranial nerves grossly intact. moves all 4 extremities w/o difficulty. Affect pleasant ? ? ?Telemetry  ? ?Sinus/sinus tach 90s-100s, minimal PVCs this am, 15-20/min last few hours ? ?Labs  ?  ?CBC ?Recent Labs  ?  12/17/21 ?1622 12/18/21 ?0615  ?WBC 6.4 5.7  ?NEUTROABS  --  4.6  ?HGB 11.5* 11.4*  ?HCT 36.5 35.4*  ?MCV 91.0 89.4  ?PLT 246 266  ? ?Basic Metabolic Panel ?Recent Labs  ?  12/17/21 ?1622 12/17/21 ?2142 12/18/21 ?0615  ?NA 137  --  134*  ?K 3.9  --  3.6  ?CL 104  --  102  ?CO2 21*  --  22  ?GLUCOSE 119*  --  97  ?BUN 13  --  13  ?CREATININE 0.93  --  0.99  ?CALCIUM 9.2  --  8.6*  ?MG  --  2.1  --   ? ?Liver Function Tests ?Recent Labs  ?  12/17/21 ?1622 12/18/21 ?0615  ?AST 109* 99*  ?ALT 120*  111*  ?ALKPHOS 194* 172*  ?BILITOT 1.6* 1.6*  ?PROT 5.9* 5.8*  ?ALBUMIN 3.5 3.4*  ? ?Recent Labs  ?  12/17/21 ?1622  ?LIPASE 34  ? ?Cardiac Enzymes ?No results for input(s): CKTOTAL, CKMB, CKMBINDEX, TROPONINI in the last 72 hours. ? ?BNP: ?BNP (last 3 results) ?Recent Labs  ?  12/01/21 ?1526 12/10/21 ?0751 12/17/21 ?2142  ?BNP 1,546.2* 1,571.6* 1,840.6*  ? ? ?ProBNP (last 3 results) ?No results for input(s): PROBNP in the last 8760 hours. ? ? ?D-Dimer ?No results for input(s): DDIMER in the last 72 hours. ?Hemoglobin A1C ?No results for input(s): HGBA1C in the last 72 hours. ?Fasting Lipid Panel ?No results for input(s): CHOL, HDL, LDLCALC, TRIG, CHOLHDL, LDLDIRECT in the last 72 hours. ?Thyroid Function Tests ?Recent Labs  ?  12/18/21 ?0615  ?TSH 0.555  ? ? ?Other results: ? ? ?Imaging  ? ? ?DG Chest 2 View ? ?Result Date: 12/17/2021 ?CLINICAL DATA:  Shortness of breath EXAM: CHEST - 2 VIEW COMPARISON:  Correlation is made to CT of the chest dated December 10, 2021 and chest x-ray dated March 04, 2021 FINDINGS: There is moderate cardiomegaly. There is  mild pulmonary vascular congestion. No focal consolidation or pleural effusion seen. Bony thorax is unremarkable. IMPRESSION: Moderate cardiomegaly and mild pulmonary vascular congestion Electronically Signed   By: Frazier Richards M.D.   On: 12/17/2021 16:34  ? ?US Abdomen Limited RUQ (LIVER/GB) ? ?Result Date: 12/17/2021 ?CLINICAL DATA:  Epigastric pain EXAM: ULTRASOUND ABDOMEN LIMITED RIGHT UPPER QUADRANT COMPARISON:  None. FINDINGS: Gallbladder: No gallstones or wall thickening visualized. No sonographic Murphy sign noted by sonographer. Common bile duct: Diameter: Normal caliber, 5 mm Liver: Heterogeneous echotexture throughout the liver. Subtle nodularity to the liver surface suggests the possibility of cirrhosis. No focal hepatic abnormality. Portal vein is patent on color Doppler imaging with normal direction of blood flow towards the liver. Other: None. IMPRESSION:  No cholelithiasis or acute cholecystitis. Heterogeneous echotexture and subtle nodularity to the liver surface suggests the possibility of cirrhosis. Recommend clinical correlation. Electronically Signed   By: Rolm Baptise M.D.   On: 12/17/2021 20:00   ? ? ?Medications:   ? ? ?Scheduled Medications: ? amiodarone  200 mg Oral BID  ? anastrozole  1 mg Oral QPM  ? apixaban  5 mg Oral BID  ? furosemide  40 mg Intravenous Q12H  ? metoprolol succinate  12.5 mg Oral QHS  ? pantoprazole  40 mg Oral Daily  ? ? ?Infusions: ? ? ?PRN Medications: ? ? ? ? ?Patient Profile  ? ?61 y.o. femal with chornic systolic CHF, NICM, PAF/ectopic atrial tach, mitral regurgitation, hyperthyroidism, PVCs, Admitted with a/c systolic CHF. ? ?Assessment/Plan  ? ?1. Chronic systolic HF: NICM, normal cors in 03/2017. 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  ?- Echo (03/2017):  EF 15-20%  ?- Echo (06/2018): EF ~30% ?- TEE (11/20): EF 5% in setting of AF ?- Echo (2/21): EF 30-35% ?- Echo (07/08/20): EF 20-25% RV ok ?- Echo (4/22): 20-25%  ?- RHC (7/22): markedly elevated filling pressures and preserved CO, prominent v-waves ?- Echo (2/23): EF 20%, severe LV dysfunction, grade III DD, RV moderately reduced, moderate MR, TR moderate to severe ?- NYHA IIIb. Admitted with marked volume overload. BNP >1800 ?- Has received 80 mg lasix IV X 1. Start 80 mg lasix IV BID. May need inotrope support if diuresis difficult ?- Plan for RHC on Monday to assess filling pressures and CO ?- Hold beta blocker ?- Intolerant to [place].  ?- Start spiro tomorrow if BP stable ?- BP has been too low for ARB/ARNI.  ?- Have not proceeded with ICD given no insurance and she is not a citizen.  ?- Consider cardiac MRI this admit ?- CR consult ?  ?2. PAF/ectopic atrial tachycardia ?- S/p previous DC-CV in 7/20. 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   ?- Previously was on amio for PVCs but developed hyperthyroidism, treated w/ methimazole.  ?- 06/2019 recurrent AF w/ RVR. Loaded w/amio.  Evaluated by EP. Initial plans were for PVI ablation but cancelled after TEE showed EF 5% and severe LAE. Pt underwent DCCV instead.   ?- Saw Dr. Rayann Heman again in 2/21. Planned for AF ablation but does not have insurance coverage for AF ablation.  ?- Continue medical treatment. No good option. HFSW helping with resources. ?-SR today ?- Continue amiodarone 200 mg twice a day . ' ?- Continue Eliquis 5 mg bid. No bleeding issues.  ?  ?3. MR ?- Prominent v-waves in PCWP tracing on RHC 7/22. ?- Moderate on echo 2/23. ?  ?4. 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  ?- Per Dr. Jasmine December, no further intervention needed. ?-  Follow amio labs. TSH okay today.  ?  ?5. H/o Frequent PVCs ?- Zio patch (8/20) with 5% PVC burden; Zio patch (3 days)  ?-11/21 with 16% PVC burden. ?- Zio (5/22) w/ only occasional PVCs, burden 3.3% on amio + mexilitine. ?- Minimal PVCs this am, increased burden this afternoon ?- Restart home mexiletine 200 mg BID ?- Continue amiodarone 200 mg twice a day.  ?  ?6. Left Breast Cancer ?- Treated w/ XRT. ?  ?7. Adenocarcinoma in situ of cervix  ?- Followed by [facility]. ?- s/p hysertectomy. ? ?8. Elevated LFTs ?- Recent HIDA negative ?- US abdomen heterogenous echotexture and subtle nodularity of liver, possible early cirrhosis. ? D/t right heart failure ?- Mildly elevated AST/ALT. ? D/t hepatic congestion ?- Follow with diuresis ? ?Length of Stay: 0 ? ?FINCH, LINDSAY N, PA-C  ?12/18/2021, 1:21 PM ? ?Advanced Heart Failure Team ?Pager [phone] (M-F; 7a - 5p)  ?Please contact [facility] for night-coverage after hours (5p -7a ) and weekends on [email or web address] ? ?Patient seen and examined with the above-signed Advanced Practice Provider and/or Housestaff. I personally reviewed laboratory data, imaging studies and relevant notes. I independently examined the patient and formulated the important aspects of the plan. I have edited the note to reflect any of my changes or salient points. I have personally  discussed the plan with the patient and/or family. ? ?Remains sob and orthopneic. Very anxious. Diuresing modestly. No CP. Remains in NSR.  ? ?General:  Weak appearing. + SOB ?HEENT: normal ?Neck: supple. JVP

## 2021-12-18 NOTE — Progress Notes (Signed)
Heart Failure Navigator Progress Note ? ?Assessed for Heart & Vascular TOC clinic readiness.  ?Patient does not meet criteria due to patient is a Advance heart failure Team patient.  ? ? ? ?Earnestine Leys, BSN, RN ?Heart Failure Nurse Navigator ?Secure Chat Only   ?

## 2021-12-18 NOTE — Consult Note (Signed)
? ?Cardiology Consult  ?  ?Patient ID: Megan Keith ?MRN: [id number], DOB/AGE: 10-05-1960  ? ?Admit date: 12/17/2021 ?Date of Consult: 12/18/2021 ?Requesting Provider: Gwenyth Allegra Tegeler, MD ? ?PCP:  Cathleen Corti, FNP ?Cardiologist:  Glori Bickers, MD   { ? ?Patient Profile  ?  ?Megan Keith is a 60 y.o. 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 She is being seen today (12/18/2021) for the evaluation of suspected decompensated heart failure.  ? ?History of Present Illness  ?  ?She reports pain in her "stomach" for the last 2 weeks or so, all the way across her upper abdomen pretty equally (right not worse than left). If she eats something it feels like she is going to burst, but she also feels severe fullness when not eating. She has had a poor appetite. These symptoms have coincided with constant fatigue and increased exertional dyspnea. She doesn't have much orthopnea but does have poor sleep (more insomnia). She can feel the PVCs when they occur and says she has felt these more over the last 4 days or so. Her HR has been more persistently above 100 (typically in the 80s-90s. She still feels that she responds to the lasix and has been taking her medications consistently. ? ?She was last seen by Dr. Haroldine Laws 4/11 with increased dyspnea and chest discomfort, found to be decompensated with JVD, 1+ edema, NYHA IIIB symptoms but in no apparent distress. She was also in atrial tachycardia around 115. Lasix was doubled for 2 days and she was arranged for DCCV on 4/17, but this was cancelled as she presented for the appointment back in sinus rhythm. She has not been a candidate for ablation due to lack of insurance.  ? ?Since then,  she was seen in the ED 4/20 for abdominal pain and chest discomfort. There was no PE, but there was evidence of congestive hepatopathy associated with gallbladder wall thickening and pericholecystic stranding and edema. Spleen was reportedly normal. HIDA scan was negative, bilirubin normal, and transaminases were nearly normal. She reportedly responded to treatment for reflux and was discharged with GI follow-up. She was seen by GI today and it was felt that all of her symptoms were due to heart failure, so she was sent back to the ED for further evaluation.  ? ?She has been in sinus rhythm with fairly high burden of PVCs (around 20% at the time of my evaluation). BP has been marginal but around her baseline. BNP today is over 1800 (highest on record) and has been persistently over 1500 since March from 1263 in December and around 900 in August. Transaminases are now more elevated in the 100s and bilirubin has doubled to 1.6. Cr is stable at 0.9. A RUQ ultrasound today showed no cholelithiasis or acute cholecystitis and lipase was normal.  ? ?Past Medical History  ? ?Past Medical History:  ?Diagnosis Date  ? A-fib ([place])   ? Abnormal TSH   ? Anemia   ? Arthritis   ? "knees, hands" (04/15/2017)  ? Breast cancer ([place])   ? CHF (congestive heart failure) ([place])   ? Chronic knee pain   ? Chronic systolic CHF (congestive heart failure) ([place])   ? COVID-19   ? Does not have health insurance   ? Frequent PVCs   ? Hyperthyroidism  03/05/2019  ? Hyperthyroidism 03/05/2019  ? 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 NICM (nonischemic cardiomyopathy) ([place])   ? a. ? PVC cardiomyopathy. right and left heart catheterization on 04/15/17, no CAD. Fick output/index 5.28/3.34.  ? Noncompliance   ? a. episodic noncompliance.  ? Pneumonia 06/2016  ?  ?Past Surgical History:  ?Procedure Laterality Date  ? BREAST CYST EXCISION Left   ? CARDIOVERSION N/A 03/09/2019  ? Procedure: CARDIOVERSION;  Surgeon: Jolaine Artist, MD;   Location: [facility] ENDOSCOPY;  Service: Cardiovascular;  Laterality: N/A;  ? CARDIOVERSION N/A 07/05/2019  ? Procedure: CARDIOVERSION;  Surgeon: Thompson Grayer, MD;  Location: [place] CV LAB;  Service: Cardiovascular;  Laterality: N/A;  ? CORONARY/GRAFT ANGIOGRAPHY N/A 04/15/2017  ? Procedure: CORONARY/GRAFT ANGIOGRAPHY;  Surgeon: Nelva Bush, MD;  Location: [place] CV LAB;  Service: Cardiovascular;  Laterality: N/A;  ? RIGHT HEART CATH N/A 04/15/2017  ? Procedure: RIGHT HEART CATH;  Surgeon: Nelva Bush, MD;  Location: [place] CV LAB;  Service: Cardiovascular;  Laterality: N/A;  ? RIGHT HEART CATH N/A 03/12/2021  ? Procedure: RIGHT HEART CATH;  Surgeon: Jolaine Artist, MD;  Location: [place] CV LAB;  Service: Cardiovascular;  Laterality: N/A;  ? TEE WITHOUT CARDIOVERSION N/A 03/09/2019  ? Procedure: TRANSESOPHAGEAL ECHOCARDIOGRAM (TEE);  Surgeon: Jolaine Artist, MD;  Location: [facility] ENDOSCOPY;  Service: Cardiovascular;  Laterality: N/A;  ? TEE WITHOUT CARDIOVERSION N/A 07/05/2019  ? Procedure: TRANSESOPHAGEAL ECHOCARDIOGRAM (TEE);  Surgeon: Thompson Grayer, MD;  Location: [place] CV LAB;  Service: Cardiovascular;  Laterality: N/A;  ? TEE WITHOUT CARDIOVERSION N/A 04/21/2021  ? Procedure: TRANSESOPHAGEAL ECHOCARDIOGRAM (TEE);  Surgeon: Fay Records, MD;  Location: [place];  Service: Cardiovascular;  Laterality: N/A;  ? TUBAL LIGATION    ?  ? ?No Known Allergies ?Inpatient Medications  ?  ? furosemide  80 mg Intravenous Once  ? ? ?Family History  ?  ?Family History  ?Problem Relation Age of Onset  ? Hypertension Mother   ? Arthritis Mother   ? Diabetes Father   ? Arthritis Sister   ? ?She indicated that her mother is deceased. She indicated that her father is alive. She indicated that the status of her sister is unknown. She indicated that both of her daughters are alive. She indicated that both of her sons are alive. ? ? ?Social History  ?  ?Social History  ? ?Socioeconomic History  ?  Marital status: Single  ?  Spouse name: Not on file  ? Number of children: 4  ? Years of education: Not on file  ? Highest education level: Not on file  ?Occupational History  ? Not on file  ?Tobacco Use  ? Smoking status: Never  ? Smokeless tobacco: Never  ?Vaping Use  ? Vaping Use: Never used  ?Substance and Sexual Activity  ? Alcohol use: Not Currently  ?  Comment: 04/15/2017 "might have a couple beers q couple months"  ? Drug use: Not Currently  ? Sexual activity: Not Currently  ?Other Topics Concern  ? Not on file  ?Social History Narrative  ? ** Merged History Encounter **  ?    ? N/A  ? ?Social Determinants of Health  ? ?Financial Resource Strain: Not on file  ?Food Insecurity: Not on file  ?Transportation Needs: Not on file  ?Physical Activity: Not on file  ?Stress: Not on file  ?Social Connections: Not on file  ?Intimate Partner Violence: Not on file  ?  ? ?Review  of Systems  ?  ?A comprehensive review of systems was performed with pertinent positives and negatives noted in the HPI.  ? ?Physical Exam  ?  ?Blood pressure 93/76, pulse 90, temperature 98.2 ?F (36.8 ?C), temperature source Oral, resp. rate 20, SpO2 100 %.  ?  ?No intake or output data in the 24 hours ending 12/18/21 0044 ?Wt Readings from Last 3 Encounters:  ?12/17/21 64.6 kg  ?12/01/21 64.5 kg  ?11/19/21 64.3 kg  ? ? ?CONSTITUTIONAL: alert and conversant, well-appearing, no distress ?HEENT: right lower molar appears loose, no mucosal lesions, no masses  ?NECK: JVD present, no masses ?CARDIAC: Regular underlying rhythm with ectopy. S3 present. 3/6 holosystolic murmur. No friction rub. JVP elevated to the jaw.   ?VASCULAR: Radial pulses intact bilaterally. No carotid bruits. ?PULMONARY/CHEST WALL: no deformities, normal breath sounds bilaterally, normal work of breathing ?ABDOMINAL: soft and non-distended, tender across upper abdomen but no masses or peritoneal signs present.  ?EXTREMITIES: trace edema, warm and well-perfused ?SKIN: Dry and  intact without apparent rashes or wounds. No peripheral cyanosis. ?NEUROLOGIC: alert, no abnormal movements, cranial nerves grossly intact. ?PSYCH: normal affect, normal speech and language ? ? ?Labs  ?  ?Recent

## 2021-12-18 NOTE — Hospital Course (Addendum)
Mrs. George Ina was admitted to the hospital with the working diagnosis of decompensated heart failure.  ? 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 ?Last EF 09/2021 improved to 20% with grade 3 diastolic dysfunction--- at baseline she has NYHA class IIIb symptoms ? ?Recent outpatient work-up for abdominal discomfort with CT scan chest abdomen pelvis showing congestive hepatopathy HIDA scan negative and came to ED with acute superimposed on chronic systolic heart failure on 4/28 ? ?On the day of hospitalization she was evaluated by GI and she was referred to the ED for volume overload. She reported worsening dyspnea at home and 10 lbs weight gain over last 10 days. On her initial physical examination her blood pressure was 109/83, HR 96, RR 23 and 02 saturation 94%, lungs with no wheezing or rhonchi, heart with S1 and S2 present with no gallops, positive JVD, abdomen not distended and no lower extremity edema.  ? ?Na 137, K 3,9, Cl 104, bicarbonate 21, glucose 119, bun 13 cr 0,93  ?AST 109 ALT 120  ?BNP 1,840 ?Wbc 6,4, hgb 115. Plt 246  ?Sars covid 19 negative  ?UA SG 1,008  ? ?Chest radiograph with cardiomegaly, bilateral hilar vascular congestion, no infiltrates. ? ?EKG 103 bpm, left axis deviation, qtc 503, sinus rhythm, with no significant ST segment or T wave changes.  ? ?Patient was placed on aggressive diuresis with IV furosemide.  ? ?5/1: Cardiac Cath showing ^^ PCWP, cMRI EF 17% ? ?Volume status has improved, patient will follow up as outpatient.   ?Patient with no insurance has limited access to further aggressive interventions.  ? ?

## 2021-12-18 NOTE — Progress Notes (Addendum)
? ?  Seen examined and agree with POC per Dr. Hal Hope ? ? 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 poor medication compliance [?   ? ?Last EF 09/2021 improved to 20% with grade 3 diastolic dysfunction--- at baseline she has NYHA class IIIb symptoms ? ?Recent outpatient work-up for abdominal discomfort with CT scan chest abdomen pelvis showing congestive hepatopathy HIDA scan negative and came to ED with acute superimposed on chronic systolic heart failure on 4/28 ?Found to have low normal pressures ?CXR showed congestion LFTs elevated BNP 1800 ?Cardiology was consulted and patient was seen by Dr. Jonne Ply ? ?AHF team seeing as well tday---looks like she is ~ 145 lb as OP--last wght in clinic =141 lb 12/01/21 ?She was diuresed in the ED--is now 136 ? ?HF aded lasix 80 bid ? ?She is comfy--not on oxygen--hasn't been OOB yet ?No cp fever ?Husband @ bedside ? ?Eomi ncat ?No JVD, no hepatojug reflux ?Cta b minimal post wheeze ?Abd soft ?No LE edema ? ?P ?Defer to Cards--but is now below clinic wgt ?Likely d/c once meds consolidated to po ? ? ?

## 2021-12-18 NOTE — H&P (Signed)
?History and Physical  ? ? ?Megan Keith ZDG:[id number] DOB: Aug 07, 1961 DOA: 12/17/2021 ? ?PCP: Cathleen Corti, FNP  ?Patient coming from: Home. ? ?Chief Complaint: Shortness of breath.  Abdominal discomfort. ? ?Patient's daughter provided translation. ? ?HPI: Megan Keith is a 61 y.o. female with history of chronic systolic heart failure last EF measured in February 2023 was 20% with grade 3 diastolic dysfunction with prior history of amiodarone induced hyperthyroidism who was recently being worked up for abdominal discomfort had CT scan of the chest abdomen pelvis which were all showing features concerning for congestive hepatopathy HIDA scan was negative had followed up with gastroenterologist today and was referred to the ER because her symptoms were concerning for acute on chronic systolic CHF.  As per the patient patient has been getting increasingly short of breath and has gained at least 10 pounds in the last 10 days.  Patient also has been abdominal discomfort with poor appetite denies any nausea vomiting.  Patient please abdomen is distended. ? ?ED Course: In the ER patient had low normal blood pressure.  Chest x-ray shows congestion.  LFTs elevated.  BNP is 1800.  High sensitive troponin was 13.  Lactic acid normal.  Cardiology on-call was consulted patient was given Lasix 80 mg IV admitted for further observation for CHF. ? ?Review of Systems: As per HPI, rest all negative. ? ? ?Past Medical History:  ?Diagnosis Date  ? A-fib ([place])   ? Abnormal TSH   ? Anemia   ? Arthritis   ? "knees, hands" (04/15/2017)  ? Breast cancer ([place])   ? CHF (congestive heart failure) ([place])   ? Chronic knee pain   ? Chronic systolic CHF (congestive heart failure) ([place])   ? COVID-19   ? Does not have health insurance   ? Frequent PVCs   ? Hyperthyroidism 03/05/2019  ? Hyperthyroidism 03/05/2019  ? 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 NICM (nonischemic cardiomyopathy) ([place])   ? a. ? PVC  cardiomyopathy. right and left heart catheterization on 04/15/17, no CAD. Fick output/index 5.28/3.34.  ? Noncompliance   ? a. episodic noncompliance.  ? Pneumonia 06/2016  ? ? ?Past Surgical History:  ?Procedure Laterality Date  ? BREAST CYST EXCISION Left   ? CARDIOVERSION N/A 03/09/2019  ? Procedure: CARDIOVERSION;  Surgeon: Jolaine Artist, MD;  Location[facility] ENDOSCOPY;  Service: Cardiovascular;  Laterality: N/A;  ? CARDIOVERSION N/A 07/05/2019  ? Procedure: CARDIOVERSION;  Surgeon: Thompson Grayer, MD;  Location: [place] CV LAB;  Service: Cardiovascular;  Laterality: N/A;  ? CORONARY/GRAFT ANGIOGRAPHY N/A 04/15/2017  ? Procedure: CORONARY/GRAFT ANGIOGRAPHY;  Surgeon: Nelva Bush, MD;  Location: [place] CV LAB;  Service: Cardiovascular;  Laterality: N/A;  ? RIGHT HEART CATH N/A 04/15/2017  ? Procedure: RIGHT HEART CATH;  Surgeon: Nelva Bush, MD;  Location: [place] CV LAB;  Service: Cardiovascular;  Laterality: N/A;  ? RIGHT HEART CATH N/A 03/12/2021  ? Procedure: RIGHT HEART CATH;  Surgeon: Jolaine Artist, MD;  Location: [place] CV LAB;  Service: Cardiovascular;  Laterality: N/A;  ? TEE WITHOUT CARDIOVERSION N/A 03/09/2019  ? Procedure: TRANSESOPHAGEAL ECHOCARDIOGRAM (TEE);  Surgeon: Jolaine Artist, MD;  Location: [facility] ENDOSCOPY;  Service: Cardiovascular;  Laterality: N/A;  ? TEE WITHOUT CARDIOVERSION N/A 07/05/2019  ? Procedure: TRANSESOPHAGEAL ECHOCARDIOGRAM (TEE);  Surgeon: Thompson Grayer, MD;  Location: [place] CV LAB;  Service: Cardiovascular;  Laterality: N/A;  ? TEE WITHOUT CARDIOVERSION N/A 04/21/2021  ? Procedure: TRANSESOPHAGEAL ECHOCARDIOGRAM (TEE);  Surgeon:  Fay Records, MD;  Location: [place];  Service: Cardiovascular;  Laterality: N/A;  ? TUBAL LIGATION    ? ? ? reports that she has never smoked. She has never used smokeless tobacco. She reports that she does not currently use alcohol. She reports that she does not currently use drugs. ? ?No Known  Allergies ? ?Family History  ?Problem Relation Age of Onset  ? Hypertension Mother   ? Arthritis Mother   ? Diabetes Father   ? Arthritis Sister   ? ? ?Prior to Admission medications   ?Medication Sig Start Date End Date Taking? Authorizing Provider  ?acetaminophen (TYLENOL) 500 MG tablet Take 500 mg by mouth every 6 (six) hours as needed for moderate pain or headache.   Yes [provider]  ?amiodarone (PACERONE) 200 MG tablet Take 200 mg by mouth 2 (two) times daily.   Yes [provider]  ?anastrozole (ARIMIDEX) 1 MG tablet Take 1 tablet (1 mg total) by mouth every evening. 12/14/21  Yes Mosher, Vida Roller A, PA-C  ?apixaban (ELIQUIS) 5 MG TABS tablet Take 1 tablet (5 mg total) by mouth 2 (two) times daily. 10/28/21  Yes Bensimhon, Shaune Pascal, MD  ?furosemide (LASIX) 40 MG tablet Take 40 mg by mouth daily.   Yes [provider]  ?metoprolol succinate (TOPROL-XL) 25 MG 24 hr tablet Take 12.5 mg by mouth at bedtime.   Yes [provider]  ?omeprazole (PRILOSEC) 20 MG capsule Take 20 mg by mouth daily.   Yes [provider]  ?mexiletine (MEXITIL) 200 MG capsule Take 1 capsule (200 mg total) by mouth 2 (two) times daily. ?Patient not taking: Reported on 12/17/2021 03/24/21   Rafael Bihari, FNP  ? ? ?Physical Exam: ?Constitutional: Moderately built and nourished. ?Vitals:  ? 12/18/21 0200 12/18/21 0245 12/18/21 0309 12/18/21 0315  ?BP: 109/83 1'00/78 98/75 92/76 '$  ?Pulse: 96 95  86  ?Resp: (!) 23   15  ?Temp:      ?TempSrc:      ?SpO2: 94% 93%  100%  ? ?Eyes: Anicteric no pallor. ?ENMT: No discharge from the ears eyes nose and mouth. ?Neck: JVD elevated.  No mass felt.  No neck rigidity. ?Respiratory: No rhonchi or crepitations. ?Cardiovascular: S1-S2 heard. ?Abdomen: Soft nontender bowel sound present. ?Musculoskeletal: No edema. ?Skin: No rash. ?Neurologic: Alert awake oriented to time place and person.  Moves all extremities. ?Psychiatric: Appears normal.  Normal affect. ? ? ?Labs  on Admission: I have personally reviewed following labs and imaging studies ? ?CBC: ?Recent Labs  ?Lab 12/17/21 ?1622  ?WBC 6.4  ?HGB 11.5*  ?HCT 36.5  ?MCV 91.0  ?PLT 246  ? ?Basic Metabolic Panel: ?Recent Labs  ?Lab 12/17/21 ?1622 12/17/21 ?2142  ?NA 137  --   ?K 3.9  --   ?CL 104  --   ?CO2 21*  --   ?GLUCOSE 119*  --   ?BUN 13  --   ?CREATININE 0.93  --   ?CALCIUM 9.2  --   ?MG  --  2.1  ? ?GFR: ?Estimated Creatinine Clearance: 49.1 mL/min (by C-G formula based on SCr of 0.93 mg/dL). ?Liver Function Tests: ?Recent Labs  ?Lab 12/17/21 ?1622  ?AST 109*  ?ALT 120*  ?ALKPHOS 194*  ?BILITOT 1.6*  ?PROT 5.9*  ?ALBUMIN 3.5  ? ?Recent Labs  ?Lab 12/17/21 ?1622  ?LIPASE 34  ? ?No results for input(s): AMMONIA in the last 168 hours. ?Coagulation Profile: ?No results for input(s): INR, PROTIME in the last  168 hours. ?Cardiac Enzymes: ?No results for input(s): CKTOTAL, CKMB, CKMBINDEX, TROPONINI in the last 168 hours. ?BNP (last 3 results) ?No results for input(s): PROBNP in the last 8760 hours. ?HbA1C: ?No results for input(s): HGBA1C in the last 72 hours. ?CBG: ?No results for input(s): GLUCAP in the last 168 hours. ?Lipid Profile: ?No results for input(s): CHOL, HDL, LDLCALC, TRIG, CHOLHDL, LDLDIRECT in the last 72 hours. ?Thyroid Function Tests: ?No results for input(s): TSH, T4TOTAL, FREET4, T3FREE, THYROIDAB in the last 72 hours. ?Anemia Panel: ?No results for input(s): VITAMINB12, FOLATE, FERRITIN, TIBC, IRON, RETICCTPCT in the last 72 hours. ?Urine analysis: ?   ?Component Value Date/Time  ? COLORURINE YELLOW 12/17/2021 2240  ? APPEARANCEUR CLEAR 12/17/2021 2240  ? LABSPEC 1.008 12/17/2021 2240  ? PHURINE 6.0 12/17/2021 2240  ? GLUCOSEU NEGATIVE 12/17/2021 2240  ? [place] NEGATIVE 12/17/2021 2240  ? [place] NEGATIVE 12/17/2021 2240  ? KETONESUR 5 (A) 12/17/2021 2240  ? PROTEINUR NEGATIVE 12/17/2021 2240  ? NITRITE NEGATIVE 12/17/2021 2240  ? LEUKOCYTESUR NEGATIVE 12/17/2021 2240  ? ?Sepsis  Labs: ?'@LABRCNTIP'$ (procalcitonin:4,lacticidven:4) ?) ?Recent Results (from the past 240 hour(s))  ?Urine Culture     Status: Abnormal  ? Collection Time: 12/10/21  8:22 AM  ? Specimen: Urine, Clean Catch  ?Result Value Ref Range Status  ? Spec

## 2021-12-18 NOTE — ED Provider Notes (Signed)
Signed out by Dr Sherry Ruffing, that he had contacted hospitalists for admit, but due to low bp, they had requested discussion w pccm. ? ?PCCM called, pt discussed, they indicate they feel admission to medicine/hospitalist service is appropriate.  ? ?Of note, it appears pts baseline bp is 440-347 systolic. Pt does have low EF at baseline. On review vitals, only one bp in 80s, and that appears to have been spurious reading. Currently bp 102/83, hr 88, pulse ox 98%.  Pt appears stable for admit to medicine. In addition, cardiology has been consulted and will see and leave their management recommendations as well. ? ?Hospitalists consulted for admission.  ? ? ?  ?Lajean Saver, MD ?12/18/21 4259 ? ?

## 2021-12-18 NOTE — Progress Notes (Signed)
Patient had questions about fluid retention per grand-daughter translating. RN educated patient on the importance of fluid restriction, maintaining a low sodium diet and daily weights. Granddaughter relayed that patient has a scale at home. RN provided congestive heart failure booklet- spanish version for patient to use at home to write down daily weights. Patient verbalize understanding and has no questions per granddaughter. ?

## 2021-12-19 LAB — COMPREHENSIVE METABOLIC PANEL
ALT: 82 U/L — ABNORMAL HIGH (ref 0–44)
AST: 46 U/L — ABNORMAL HIGH (ref 15–41)
Albumin: 3.2 g/dL — ABNORMAL LOW (ref 3.5–5.0)
Alkaline Phosphatase: 162 U/L — ABNORMAL HIGH (ref 38–126)
Anion gap: 9 (ref 5–15)
BUN: 12 mg/dL (ref 8–23)
CO2: 25 mmol/L (ref 22–32)
Calcium: 8.6 mg/dL — ABNORMAL LOW (ref 8.9–10.3)
Chloride: 102 mmol/L (ref 98–111)
Creatinine, Ser: 0.94 mg/dL (ref 0.44–1.00)
GFR, Estimated: >60 mL/min (ref >60)
Glucose, Bld: 99 mg/dL (ref 70–99)
Potassium: 3.1 mmol/L — ABNORMAL LOW (ref 3.5–5.1)
Sodium: 136 mmol/L (ref 135–145)
Total Bilirubin: 0.9 mg/dL (ref 0.3–1.2)
Total Protein: 5.6 g/dL — ABNORMAL LOW (ref 6.5–8.1)

## 2021-12-19 LAB — CBC
HCT: 35.1 % — ABNORMAL LOW (ref 36.0–46.0)
Hemoglobin: 11.5 g/dL — ABNORMAL LOW (ref 12.0–15.0)
MCH: 28.7 pg (ref 26.0–34.0)
MCHC: 32.8 g/dL (ref 30.0–36.0)
MCV: 87.5 fL (ref 80.0–100.0)
Platelets: 244 10*3/uL (ref 150–400)
RBC: 4.01 MIL/uL (ref 3.87–5.11)
RDW: 16.6 % — ABNORMAL HIGH (ref 11.5–15.5)
WBC: 5.2 10*3/uL (ref 4.0–10.5)
nRBC: 0 % (ref 0.0–0.2)

## 2021-12-19 LAB — MAGNESIUM: Magnesium: 2.1 mg/dL (ref 1.7–2.4)

## 2021-12-19 LAB — URINE CULTURE: Culture: <10000 — AB

## 2021-12-19 MED ORDER — DIGOXIN 125 MCG PO TABS
0.1250 mg | ORAL_TABLET | Freq: Every day | ORAL | Status: DC
Start: 2021-12-19 — End: 2021-12-24
  Administered 2021-12-19 – 2021-12-24 (×6): 0.125 mg via ORAL
  Filled 2021-12-19 (×6): qty 1

## 2021-12-19 MED ORDER — SPIRONOLACTONE 25 MG PO TABS
25.0000 mg | ORAL_TABLET | Freq: Every day | ORAL | Status: DC
  Administered 2021-12-19 – 2021-12-25 (×7): 25 mg via ORAL
  Filled 2021-12-19 (×7): qty 1

## 2021-12-19 MED ORDER — POTASSIUM CHLORIDE CRYS ER 20 MEQ PO TBCR
40.0000 meq | EXTENDED_RELEASE_TABLET | Freq: Once | ORAL | Status: AC
  Administered 2021-12-19: 40 meq via ORAL
  Filled 2021-12-19: qty 2

## 2021-12-19 MED ORDER — POTASSIUM CHLORIDE CRYS ER 20 MEQ PO TBCR
40.0000 meq | EXTENDED_RELEASE_TABLET | ORAL | Status: AC
  Administered 2021-12-19 (×2): 40 meq via ORAL
  Filled 2021-12-19 (×2): qty 2

## 2021-12-19 MED ORDER — TRAZODONE HCL 50 MG PO TABS
50.0000 mg | ORAL_TABLET | Freq: Every day | ORAL | Status: DC
  Administered 2021-12-19 – 2021-12-23 (×5): 50 mg via ORAL
  Filled 2021-12-19 (×6): qty 1

## 2021-12-19 MED ORDER — METOLAZONE 2.5 MG PO TABS
2.5000 mg | ORAL_TABLET | Freq: Once | ORAL | Status: AC
  Administered 2021-12-19: 2.5 mg via ORAL
  Filled 2021-12-19: qty 1

## 2021-12-19 MED ORDER — ZOLPIDEM TARTRATE 5 MG PO TABS: 5.0000 mg | ORAL_TABLET | Freq: Every evening | ORAL | Status: DC | PRN

## 2021-12-19 NOTE — Progress Notes (Signed)
? ? Advanced Heart Failure Rounding Note ? ?PCP-Cardiologist: Glori Bickers, MD  ? ?Subjective:   ? ?Lasix increased to 80 iv bid yesterday. Modest diuresis. ~1.5L total. Weight down 2 pounds.  ? ?SCr stable at 0.9. K 3.1  Bicab 25.  ? ?Breathing much improved. Orthopnea resolved. No CP. + insomnia ? ?Objective:   ?Weight Range: ?60.5 kg ?Body mass index is 28.85 kg/m?.  ? ?Vital Signs:   ?Temp:  [97.7 ?F (36.5 ?C)-98.4 ?F (36.9 ?C)] 98.1 ?F (36.7 ?C) (04/29 0820) ?Pulse Rate:  [87-100] 87 (04/29 0032) ?Resp:  [16-24] 18 (04/29 0820) ?BP: (88-102)/(63-79) 102/75 (04/29 3559) ?SpO2:  [91 %-100 %] 100 % (04/29 0433) ?Weight:  [60.5 kg-61.3 kg] 60.5 kg (04/29 0032) ?Last BM Date : 12/17/21 ? ?Weight change: ?Filed Weights  ? 12/18/21 1158 12/19/21 0032  ?Weight: 61.3 kg 60.5 kg  ? ? ?Intake/Output:  ? ?Intake/Output Summary (Last 24 hours) at 12/19/2021 1037 ?Last data filed at 12/19/2021 [id number] ?Gross per 24 hour  ?Intake 720 ml  ?Output 1450 ml  ?Net -730 ml  ? ?  ? ? ?Physical Exam  ?  ?General:  Lying flat in bed. No respiratory difficulty ?HEENT: normal ?Neck: supple. JVP 8 Carotids 2+ bilat; no bruits. No lymphadenopathy or thryomegaly appreciated. ?Cor: PMI nondisplaced. Regular rate & rhythm. 2/6 MR ?Lungs: clear ?Abdomen: soft, nontender, nondistended. No hepatosplenomegaly. No bruits or masses. Good bowel sounds. ?Extremities: no cyanosis, clubbing, rash, tr edema ?Neuro: alert & orientedx3, cranial nerves grossly intact. moves all 4 extremities w/o difficulty. Affect pleasant ? ? ?Telemetry  ? ?Sinus 80-90s Personally reviewed ? ?Labs  ?  ?CBC ?Recent Labs  ?  12/18/21 ?0615 12/19/21 ?0344  ?WBC 5.7 5.2  ?NEUTROABS 4.6  --   ?HGB 11.4* 11.5*  ?HCT 35.4* 35.1*  ?MCV 89.4 87.5  ?PLT 266 244  ? ? ?Basic Metabolic Panel ?Recent Labs  ?  12/17/21 ?2142 12/18/21 ?0615 12/19/21 ?0344  ?NA  --  134* 136  ?K  --  3.6 3.1*  ?CL  --  102 102  ?CO2  --  22 25  ?GLUCOSE  --  97 99  ?BUN  --  13 12  ?CREATININE  --  0.99  0.94  ?CALCIUM  --  8.6* 8.6*  ?MG 2.1  --  2.1  ? ? ?Liver Function Tests ?Recent Labs  ?  12/18/21 ?0615 12/19/21 ?0344  ?AST 99* 46*  ?ALT 111* 82*  ?ALKPHOS 172* 162*  ?BILITOT 1.6* 0.9  ?PROT 5.8* 5.6*  ?ALBUMIN 3.4* 3.2*  ? ? ?Recent Labs  ?  12/17/21 ?1622  ?LIPASE 34  ? ? ?Cardiac Enzymes ?No results for input(s): CKTOTAL, CKMB, CKMBINDEX, TROPONINI in the last 72 hours. ? ?BNP: ?BNP (last 3 results) ?Recent Labs  ?  12/01/21 ?1526 12/10/21 ?0751 12/17/21 ?2142  ?BNP 1,546.2* 1,571.6* 1,840.6*  ? ? ? ?ProBNP (last 3 results) ?No results for input(s): PROBNP in the last 8760 hours. ? ? ?D-Dimer ?No results for input(s): DDIMER in the last 72 hours. ?Hemoglobin A1C ?No results for input(s): HGBA1C in the last 72 hours. ?Fasting Lipid Panel ?No results for input(s): CHOL, HDL, LDLCALC, TRIG, CHOLHDL, LDLDIRECT in the last 72 hours. ?Thyroid Function Tests ?Recent Labs  ?  12/18/21 ?0615  ?TSH 0.555  ? ? ? ?Other results: ? ? ?Imaging  ? ? ?No results found. ? ? ?Medications:   ? ? ?Scheduled Medications: ? amiodarone  200 mg Oral BID  ? anastrozole  1  mg Oral QPM  ? apixaban  5 mg Oral BID  ? furosemide  80 mg Intravenous BID  ? mexiletine  200 mg Oral Q12H  ? pantoprazole  40 mg Oral Daily  ? potassium chloride  40 mEq Oral Q4H  ? ? ?Infusions: ? ? ?PRN Medications: ? ? ? ?Patient Profile  ? ?61 y.o. femal with chornic systolic CHF, NICM, PAF/ectopic atrial tach, mitral regurgitation, hyperthyroidism, PVCs, Admitted with a/c systolic CHF. ? ?Assessment/Plan  ? ?1. Chronic systolic HF: NICM, normal cors in 03/2017. 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  ?- Echo (03/2017):  EF 15-20%  ?- TEE (11/20): EF 5% in setting of AF ?- Echo (2/21): EF 30-35% ?- Echo (4/22): 20-25%  ?- RHC (7/22): markedly elevated filling pressures and preserved CO, prominent v-waves ?- Echo (2/23): EF 20%, severe LV dysfunction, grade III DD, RV moderately reduced, moderate MR, TR moderate to severe ?- Admitted with marked volume overload. BNP  >1800 ?- Volume improving continue IV lasix.  ?- Plan for [place] on Monday to assess filling pressures and CO ?- No beta blocker with possible low output ?- Intolerant to [place].  ?- start digoxin and spiro  ?- BP has been too low for ARB/ARNI.  ?- Have not proceeded with ICD given no insurance and she is not a citizen.  ?- Consider cardiac MRI this admit ?- CR consult ?- Suspect may be low output at times but also has significant MR. Likely will need cMRI and RHC next week.  Unfortunately, not candidate for advanced therapies given immigration status.  ?  ?2. PAF/ectopic atrial tachycardia ?- S/p previous DC-CV in 7/20. 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   ?- Previously was on amio for PVCs but developed hyperthyroidism, treated w/ methimazole.  ?- 06/2019 recurrent AF w/ RVR. Loaded w/amio. Evaluated by EP. Initial plans were for PVI ablation but cancelled after TEE showed EF 5% and severe LAE. Pt underwent DCCV instead.   ?- Saw Dr. Rayann Heman again in 2/21. Planned for AF ablation but does not have insurance coverage for AF ablation.  ?- Continue medical treatment. No good option. HFSW helping with resources. ?-SR today ?- Continue amiodarone 200 mg twice a day . ' ?- Continue Eliquis 5 mg bid. No bleeding issues.  ?  ?3. Moderate to severe mitral regurgitation ?- Prominent v-waves in PCWP tracing on RHC 7/22. ?- Moderate on echo 2/23. ?  ?4. 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  ?- Per Dr. Jasmine December, no further intervention needed. ?- Follow amio labs. TSH okay today.  ?  ?5. H/o Frequent PVCs ?- Zio patch (8/20) with 5% PVC burden; Zio patch (3 days)  ?-11/21 with 16% PVC burden. ?- Zio (5/22) w/ only occasional PVCs, burden 3.3% on amio + mexilitine. ?- Minimal PVCs this am, increased burden this afternoon ?- Continue home mexiletine 200 mg BID ?- Continue amiodarone 200 mg twice a day.  ?- Not candidate for ablation due to severe MR and LAE ?  ?6. Left Breast Cancer ?- Treated w/ XRT. ?  ?7.  Adenocarcinoma in situ of cervix  ?- Followed by [facility]. ?- s/p hysertectomy. ? ?8. Elevated LFTs ?- Recent HIDA negative ?- US abdomen heterogenous echotexture and subtle nodularity of liver, possible early cirrhosis. ? D/t right heart failure ?- Mildly elevated AST/ALT. Due to hepatic congestion  ?- Improving with diuresis ? ?9. Hypokalemia ?- supp ?- start spiro ? ?Length of Stay: 1 ? ?Glori Bickers, MD  ?12/19/2021, 10:37 AM ? ?Advanced  Heart Failure Team ?Pager [phone] (M-F; 7a - 5p)  ?Please contact [facility] for night-coverage after hours (5p -7a ) and weekends on [email or web address] ? ? ? ?  ?

## 2021-12-19 NOTE — Progress Notes (Signed)
?PROGRESS NOTE ? ? 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 poor medication compliance [?   ?  ?Last EF 09/2021 improved to 20% with grade 3 diastolic dysfunction--- at baseline she has NYHA class IIIb symptoms ? ?Recent outpatient work-up for abdominal discomfort with CT scan chest abdomen pelvis showing congestive hepatopathy HIDA scan negative and came to ED with acute superimposed on chronic systolic heart failure on 4/28 ?Found to have low normal pressures ?CXR showed congestion LFTs elevated BNP 1800 ?Cardiology was consulted and patient was seen by Dr. Jonne Ply ?  ?AHF team seeing as well tday---looks like she is ~ 145 lb as OP--last wght in clinic =141 lb 12/01/21 ?She was diuresed in the ED--is now 136 ?  ?HF added lasix 80 bid ? ? ?Hospital-Problem based course ? 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 ?Wght 61-->60, -4.7 L since admit ?PAF/Ectopic A Tach-prior DC CV ?Prolonged QTc ?Cont Amio 200 bid, eliquis ?Hypokalemia ?Replace potassium aggressively (40 mg twice today) magnesium is 2.1 ?Insomnia ?Unclear if tachycardia causing this--Mexiletine can cause ataxia nervousness etc--unclear if this is precipitant ?Adding Ambien tonight to see if helps ?Breast cancer status post mastectomy left  side ?Outpatient oncology follow-up ? ?DVT prophylaxis: Eliquis ?Code Status: Full ?Family Communication: Called daughter on the phone ?Disposition:  ?Status is: Inpatient ?Remains inpatient appropriate because: Not stable and needs cardiology input probably right heart cath and cardiac MRI ?  ?Consultants:  ?Advanced heart failure team ? ?Procedures: Multiple ? ?Antimicrobials: None ? ? ?Subjective: ?"I cannot sleep at night" ?No issues with sleep onset--issues more with sleep maintenance--daughter relates this has been going on for months ?--cannot see Neurology for at l;east another mo ? ?Objective: ?Vitals:  ? 12/19/21 0433 12/19/21 0820 12/19/21 0918 12/19/21 1140  ?BP: 95/73 95/63 102/75 97/75  ?Pulse:      ?Resp: '18 18  20  '$ ?Temp: 98.1 ?F (36.7 ?C) 98.1 ?F (36.7 ?C)    ?TempSrc: Oral Oral    ?SpO2: 100%     ?Weight:      ?Height:      ? ? ?Intake/Output Summary (Last 24 hours) at 12/19/2021 1542 ?Last data filed at 12/19/2021 1319 ?Gross per 24 hour  ?Intake 600 ml  ?Output 2720 ml  ?Net -2120 ml  ? ?Filed Weights  ? 12/18/21 1158 12/19/21 0032  ?Weight: 61.3 kg 60.5 kg  ? ? ?Examination: ? ?Coherent pleasant ?JVD present ?No icterus no pallor ?Chest clear no rales rhonchi wheezes ?Mild lower extremity edema ?Neurologically intact moving all 4 limbs ?No lymphadenopathy ?ROM intact ? ?Data Reviewed: personally reviewed  ? ?CBC ?   ?Component Value Date/Time  ? WBC 5.2 12/19/2021 0344  ? RBC 4.01 12/19/2021 0344  ? HGB 11.5 (L) 12/19/2021 0344  ? HCT 35.1 (L) 12/19/2021 0344  ? PLT 244 12/19/2021 0344  ? MCV 87.5 12/19/2021 0344  ? MCH 28.7 12/19/2021 0344  ? MCHC  32.8 12/19/2021 0344  ? RDW 16.6 (H) 12/19/2021 0344  ? LYMPHSABS 0.5 (L) 12/18/2021 0615  ? MONOABS 0.5 12/18/2021 0615  ? EOSABS 0.1 12/18/2021 0615  ? BASOSABS 0.1 12/18/2021 0615  ? ? ?  Latest Ref Rng & Units 12/19/2021  ?  3:44 AM 12/18/2021  ?  6:15 AM 12/17/2021  ?  4:22 PM  ?CMP  ?Glucose 70 - 99 mg/dL 99   97   119    ?BUN 8 - 23 mg/dL '12   13    13    '$ ?Creatinine 0.44 - 1.00 mg/dL 0.94   0.99   0.93    ?Sodium 135 - 145 mmol/L 136   134   137    ?Potassium 3.5 - 5.1 mmol/L 3.1   3.6   3.9    ?Chloride 98 - 111 mmol/L 102   102   104    ?CO2 22 - 32 mmol/L '25   22   21    '$ ?Calcium 8.9 - 10.3 mg/dL 8.6   8.6   9.2    ?Total Protein 6.5 - 8.1 g/dL 5.6   5.8   5.9    ?Total Bilirubin 0.3 - 1.2 mg/dL 0.9   1.6   1.6    ?Alkaline Phos 38 - 126 U/L 162   172   194    ?AST 15 - 41 U/L 46   99   109    ?ALT 0 - 44 U/L 82   111   120    ? ? ? ?Radiology Studies: ?DG Chest 2 View ? ?Result Date: 12/17/2021 ?CLINICAL DATA:  Shortness of breath EXAM: CHEST - 2 VIEW COMPARISON:  Correlation is made to CT of the chest dated December 10, 2021 and chest x-ray dated March 04, 2021 FINDINGS: There is moderate cardiomegaly. There is mild pulmonary vascular congestion. No focal consolidation or pleural effusion seen. Bony thorax is unremarkable. IMPRESSION: Moderate cardiomegaly and mild pulmonary vascular congestion Electronically Signed   By: Frazier Richards M.D.   On: 12/17/2021 16:34  ? ?US Abdomen Limited RUQ (LIVER/GB) ? ?Result Date: 12/17/2021 ?CLINICAL DATA:  Epigastric pain EXAM: ULTRASOUND ABDOMEN LIMITED RIGHT UPPER QUADRANT COMPARISON:  None. FINDINGS: Gallbladder: No gallstones or wall thickening visualized. No sonographic Murphy sign noted by sonographer. Common bile duct: Diameter: Normal caliber, 5 mm Liver: Heterogeneous echotexture throughout the liver. Subtle nodularity to the liver surface suggests the possibility of cirrhosis. No focal hepatic abnormality. Portal vein is patent on color Doppler imaging with normal direction of blood flow towards the liver. Other: None. IMPRESSION: No cholelithiasis or acute cholecystitis. Heterogeneous echotexture and subtle nodularity to the liver surface suggests the possibility of cirrhosis. Recommend clinical correlation. Electronically Signed   By: Rolm Baptise M.D.   On: 12/17/2021 20:00   ? ? ?Scheduled Meds: ? amiodarone   200 mg Oral BID  ? anastrozole  1 mg Oral QPM  ? apixaban  5 mg Oral BID  ? digoxin  0.125 mg Oral Daily  ? furosemide  80 mg Intravenous BID  ? mexiletine  200 mg Oral Q12H  ? pantoprazole  40 mg Oral Daily  ? spironolactone  25 mg Oral Daily  ? traZODone  50 mg Oral QHS  ? ?Continuous Infusions: ? ? LOS: 1 day  ? ?Time spent: 40 ? ?Nita Sells, MD ?[facility] ?To contact the attending provider between 7A-7P or the covering provider during after hours 7P-7A, please log into the web site [email or web address]  and access using universal [place] password for that web site. If you do not have the password, please call the hospital operator. ? ?12/19/2021, 3:42 PM  ? ? ?

## 2021-12-20 LAB — COMPREHENSIVE METABOLIC PANEL
ALT: 67 U/L — ABNORMAL HIGH (ref 0–44)
AST: 31 U/L (ref 15–41)
Albumin: 3.7 g/dL (ref 3.5–5.0)
Alkaline Phosphatase: 162 U/L — ABNORMAL HIGH (ref 38–126)
Anion gap: 10 (ref 5–15)
BUN: 14 mg/dL (ref 8–23)
CO2: 28 mmol/L (ref 22–32)
Calcium: 9.3 mg/dL (ref 8.9–10.3)
Chloride: 97 mmol/L — ABNORMAL LOW (ref 98–111)
Creatinine, Ser: 1.03 mg/dL — ABNORMAL HIGH (ref 0.44–1.00)
GFR, Estimated: >60 mL/min (ref >60)
Glucose, Bld: 96 mg/dL (ref 70–99)
Potassium: 3.5 mmol/L (ref 3.5–5.1)
Sodium: 135 mmol/L (ref 135–145)
Total Bilirubin: 1 mg/dL (ref 0.3–1.2)
Total Protein: 6.6 g/dL (ref 6.5–8.1)

## 2021-12-20 LAB — CBC
HCT: 38 % (ref 36.0–46.0)
Hemoglobin: 12.8 g/dL (ref 12.0–15.0)
MCH: 29.2 pg (ref 26.0–34.0)
MCHC: 33.7 g/dL (ref 30.0–36.0)
MCV: 86.8 fL (ref 80.0–100.0)
Platelets: 306 10*3/uL (ref 150–400)
RBC: 4.38 MIL/uL (ref 3.87–5.11)
RDW: 16.6 % — ABNORMAL HIGH (ref 11.5–15.5)
WBC: 6 10*3/uL (ref 4.0–10.5)
nRBC: 0 % (ref 0.0–0.2)

## 2021-12-20 NOTE — H&P (View-Only) (Signed)
? ? Advanced Heart Failure Rounding Note ? ?PCP-Cardiologist: Glori Bickers, MD  ? ?Subjective:   ? ?Remains on lasix 80 IV bid. Weight down another 4 pounds.  ? ?Breathing much better. No orthopnea or PND.  ? ?Scr stable. K 3.5  ? ?Objective:   ?Weight Range: ?58.5 kg ?Body mass index is 27.92 kg/m?.  ? ?Vital Signs:   ?Temp:  [97.5 ?F (36.4 ?C)-97.8 ?F (36.6 ?C)] 97.8 ?F (36.6 ?C) (04/30 4696) ?Pulse Rate:  [74-94] 84 (04/30 0728) ?Resp:  [17-20] 17 (04/30 0728) ?BP: (80-98)/(61-75) 91/67 (04/30 2952) ?SpO2:  [94 %-100 %] 95 % (04/30 0728) ?Weight:  [58.5 kg] 58.5 kg (04/30 0027) ?Last BM Date : 12/17/21 ? ?Weight change: ?Filed Weights  ? 12/18/21 1158 12/19/21 0032 12/20/21 0027  ?Weight: 61.3 kg 60.5 kg 58.5 kg  ? ? ?Intake/Output:  ? ?Intake/Output Summary (Last 24 hours) at 12/20/2021 1054 ?Last data filed at 12/20/2021 8413 ?Gross per 24 hour  ?Intake 1000 ml  ?Output 2670 ml  ?Net -1670 ml  ? ?  ? ? ?Physical Exam  ?  ?General:  Sitting up in bed No resp difficulty ?HEENT: normal ?Neck: supple. no JVD. Carotids 2+ bilat; no bruits. No lymphadenopathy or thryomegaly appreciated. ?Cor: PMI nondisplaced. Regular rate & rhythm. 2/6 MR ?Lungs: clear ?Abdomen: soft, nontender, nondistended. No hepatosplenomegaly. No bruits or masses. Good bowel sounds. ?Extremities: no cyanosis, clubbing, rash, edema ?Neuro: alert & orientedx3, cranial nerves grossly intact. moves all 4 extremities w/o difficulty. Affect pleasant ? ? ? ?Telemetry  ? ?Sinus 80-90s Personally reviewed ? ?Labs  ?  ?CBC ?Recent Labs  ?  12/18/21 ?0615 12/19/21 ?0344 12/20/21 ?0245  ?WBC 5.7 5.2 6.0  ?NEUTROABS 4.6  --   --   ?HGB 11.4* 11.5* 12.8  ?HCT 35.4* 35.1* 38.0  ?MCV 89.4 87.5 86.8  ?PLT 266 244 306  ? ? ?Basic Metabolic Panel ?Recent Labs  ?  12/17/21 ?2142 12/18/21 ?0615 12/19/21 ?0344 12/20/21 ?0245  ?NA  --    < > 136 135  ?K  --    < > 3.1* 3.5  ?CL  --    < > 102 97*  ?CO2  --    < > 25 28  ?GLUCOSE  --    < > 99 96  ?BUN  --    < >  12 14  ?CREATININE  --    < > 0.94 1.03*  ?CALCIUM  --    < > 8.6* 9.3  ?MG 2.1  --  2.1  --   ? < > = values in this interval not displayed.  ? ? ?Liver Function Tests ?Recent Labs  ?  12/19/21 ?0344 12/20/21 ?0245  ?AST 46* 31  ?ALT 82* 67*  ?ALKPHOS 162* 162*  ?BILITOT 0.9 1.0  ?PROT 5.6* 6.6  ?ALBUMIN 3.2* 3.7  ? ? ?Recent Labs  ?  12/17/21 ?1622  ?LIPASE 34  ? ? ?Cardiac Enzymes ?No results for input(s): CKTOTAL, CKMB, CKMBINDEX, TROPONINI in the last 72 hours. ? ?BNP: ?BNP (last 3 results) ?Recent Labs  ?  12/01/21 ?1526 12/10/21 ?0751 12/17/21 ?2142  ?BNP 1,546.2* 1,571.6* 1,840.6*  ? ? ? ?ProBNP (last 3 results) ?No results for input(s): PROBNP in the last 8760 hours. ? ? ?D-Dimer ?No results for input(s): DDIMER in the last 72 hours. ?Hemoglobin A1C ?No results for input(s): HGBA1C in the last 72 hours. ?Fasting Lipid Panel ?No results for input(s): CHOL, HDL, LDLCALC, TRIG, CHOLHDL, LDLDIRECT in the last  72 hours. ?Thyroid Function Tests ?Recent Labs  ?  12/18/21 ?0615  ?TSH 0.555  ? ? ? ?Other results: ? ? ?Imaging  ? ? ?No results found. ? ? ?Medications:   ? ? ?Scheduled Medications: ? amiodarone  200 mg Oral BID  ? anastrozole  1 mg Oral QPM  ? apixaban  5 mg Oral BID  ? digoxin  0.125 mg Oral Daily  ? furosemide  80 mg Intravenous BID  ? mexiletine  200 mg Oral Q12H  ? pantoprazole  40 mg Oral Daily  ? spironolactone  25 mg Oral Daily  ? traZODone  50 mg Oral QHS  ? ? ?Infusions: ? ? ?PRN Medications: ? ? ? ?Patient Profile  ? ?61 y.o. femal with chornic systolic CHF, NICM, PAF/ectopic atrial tach, mitral regurgitation, hyperthyroidism, PVCs, Admitted with a/c systolic CHF. ? ?Assessment/Plan  ? ?1. Chronic systolic HF: NICM, normal cors in 03/2017. 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  ?- Echo (03/2017):  EF 15-20%  ?- TEE (11/20): EF 5% in setting of AF ?- Echo (2/21): EF 30-35% ?- Echo (4/22): 20-25%  ?- RHC (7/22): markedly elevated filling pressures and preserved CO, prominent v-waves ?- Echo (2/23): EF  20%, severe LV dysfunction, grade III DD, RV moderately reduced, moderate MR, TR moderate to severe ?- Admitted with marked volume overload. BNP >1800 ?- Volume status much improved. Now looks euvolemic ?- Plan for RHC tomorrow am to assess filling pressures and CO ?- No beta blocker with possible low output ?- Intolerant to [place].  ?- Continue digoxin and spiro  ?- BP has been too low for ARB/ARNI. SB P remains in 90s this am.  ?- Have not proceeded with ICD given no insurance and she is not a citizen.  ?- Consider cardiac MRI this admit ?- Suspect may be low output at times but also has significant MR. Likely will need cMRI and RHC next week.  Unfortunately, not candidate for advanced therapies given immigration status.  ?  ?2. PAF/ectopic atrial tachycardia ?- S/p previous DC-CV in 7/20. 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   ?- Previously was on amio for PVCs but developed hyperthyroidism, treated w/ methimazole.  ?- 06/2019 recurrent AF w/ RVR. Loaded w/amio. Evaluated by EP. Initial plans were for PVI ablation but cancelled after TEE showed EF 5% and severe LAE. Pt underwent DCCV instead.   ?- Saw Dr. Rayann Heman again in 2/21. Planned for AF ablation but does not have insurance coverage for AF ablation.  ?- Continue medical treatment. No good option. HFSW helping with resources. ?-SR today ?- Continue amiodarone 200 mg twice a day . ' ?- Continue Eliquis 5 bid. No obvious bleeding  ?  ?3. Moderate to severe mitral regurgitation ?- Prominent v-waves in PCWP tracing on RHC 7/22. ?- Moderate on echo 2/23. ?  ?4. 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  ?- Per Dr. Jasmine December, no further intervention needed. ?- Follow amio labs. TSH okay today.  ?  ?5. H/o Frequent PVCs ?- Zio patch (8/20) with 5% PVC burden; Zio patch (3 days)  ?-11/21 with 16% PVC burden. ?- Zio (5/22) w/ only occasional PVCs, burden 3.3% on amio + mexilitine. ?- Minimal PVCs this am, increased burden this afternoon ?- Continue home  mexiletine 200 mg BID ?- Continue amiodarone 200 mg twice a day.  ?- Not candidate for ablation due to severe MR and LAE ?  ?6. Left Breast Cancer ?- Treated w/ XRT. ?  ?7. Adenocarcinoma in situ of cervix  ?-  Followed by [facility]. ?- s/p hysertectomy. ? ?8. Elevated LFTs ?- Recent HIDA negative ?- US abdomen heterogenous echotexture and subtle nodularity of liver, possible early cirrhosis. ? D/t right heart failure ?- Mildly elevated AST/ALT. Due to hepatic congestion  ?- Improving with diuresis ? ?9. Hypokalemia ?- supp ?- continue spiro  ? ?Length of Stay: 2 ? ?Glori Bickers, MD  ?12/20/2021, 10:54 AM ? ?Advanced Heart Failure Team ?Pager [phone] (M-F; 7a - 5p)  ?Please contact [facility] for night-coverage after hours (5p -7a ) and weekends on [email or web address] ? ? ? ?  ?

## 2021-12-20 NOTE — Progress Notes (Signed)
? ? Advanced Heart Failure Rounding Note ? ?PCP-Cardiologist: Glori Bickers, MD  ? ?Subjective:   ? ?Remains on lasix 80 IV bid. Weight down another 4 pounds.  ? ?Breathing much better. No orthopnea or PND.  ? ?Scr stable. K 3.5  ? ?Objective:   ?Weight Range: ?58.5 kg ?Body mass index is 27.92 kg/m?.  ? ?Vital Signs:   ?Temp:  [97.5 ?F (36.4 ?C)-97.8 ?F (36.6 ?C)] 97.8 ?F (36.6 ?C) (04/30 2119) ?Pulse Rate:  [74-94] 84 (04/30 0728) ?Resp:  [17-20] 17 (04/30 0728) ?BP: (80-98)/(61-75) 91/67 (04/30 4174) ?SpO2:  [94 %-100 %] 95 % (04/30 0728) ?Weight:  [58.5 kg] 58.5 kg (04/30 0027) ?Last BM Date : 12/17/21 ? ?Weight change: ?Filed Weights  ? 12/18/21 1158 12/19/21 0032 12/20/21 0027  ?Weight: 61.3 kg 60.5 kg 58.5 kg  ? ? ?Intake/Output:  ? ?Intake/Output Summary (Last 24 hours) at 12/20/2021 1054 ?Last data filed at 12/20/2021 0814 ?Gross per 24 hour  ?Intake 1000 ml  ?Output 2670 ml  ?Net -1670 ml  ? ?  ? ? ?Physical Exam  ?  ?General:  Sitting up in bed No resp difficulty ?HEENT: normal ?Neck: supple. no JVD. Carotids 2+ bilat; no bruits. No lymphadenopathy or thryomegaly appreciated. ?Cor: PMI nondisplaced. Regular rate & rhythm. 2/6 MR ?Lungs: clear ?Abdomen: soft, nontender, nondistended. No hepatosplenomegaly. No bruits or masses. Good bowel sounds. ?Extremities: no cyanosis, clubbing, rash, edema ?Neuro: alert & orientedx3, cranial nerves grossly intact. moves all 4 extremities w/o difficulty. Affect pleasant ? ? ? ?Telemetry  ? ?Sinus 80-90s Personally reviewed ? ?Labs  ?  ?CBC ?Recent Labs  ?  12/18/21 ?0615 12/19/21 ?0344 12/20/21 ?0245  ?WBC 5.7 5.2 6.0  ?NEUTROABS 4.6  --   --   ?HGB 11.4* 11.5* 12.8  ?HCT 35.4* 35.1* 38.0  ?MCV 89.4 87.5 86.8  ?PLT 266 244 306  ? ? ?Basic Metabolic Panel ?Recent Labs  ?  12/17/21 ?2142 12/18/21 ?0615 12/19/21 ?0344 12/20/21 ?0245  ?NA  --    < > 136 135  ?K  --    < > 3.1* 3.5  ?CL  --    < > 102 97*  ?CO2  --    < > 25 28  ?GLUCOSE  --    < > 99 96  ?BUN  --    < >  12 14  ?CREATININE  --    < > 0.94 1.03*  ?CALCIUM  --    < > 8.6* 9.3  ?MG 2.1  --  2.1  --   ? < > = values in this interval not displayed.  ? ? ?Liver Function Tests ?Recent Labs  ?  12/19/21 ?0344 12/20/21 ?0245  ?AST 46* 31  ?ALT 82* 67*  ?ALKPHOS 162* 162*  ?BILITOT 0.9 1.0  ?PROT 5.6* 6.6  ?ALBUMIN 3.2* 3.7  ? ? ?Recent Labs  ?  12/17/21 ?1622  ?LIPASE 34  ? ? ?Cardiac Enzymes ?No results for input(s): CKTOTAL, CKMB, CKMBINDEX, TROPONINI in the last 72 hours. ? ?BNP: ?BNP (last 3 results) ?Recent Labs  ?  12/01/21 ?1526 12/10/21 ?0751 12/17/21 ?2142  ?BNP 1,546.2* 1,571.6* 1,840.6*  ? ? ? ?ProBNP (last 3 results) ?No results for input(s): PROBNP in the last 8760 hours. ? ? ?D-Dimer ?No results for input(s): DDIMER in the last 72 hours. ?Hemoglobin A1C ?No results for input(s): HGBA1C in the last 72 hours. ?Fasting Lipid Panel ?No results for input(s): CHOL, HDL, LDLCALC, TRIG, CHOLHDL, LDLDIRECT in the last  72 hours. ?Thyroid Function Tests ?Recent Labs  ?  12/18/21 ?0615  ?TSH 0.555  ? ? ? ?Other results: ? ? ?Imaging  ? ? ?No results found. ? ? ?Medications:   ? ? ?Scheduled Medications: ? amiodarone  200 mg Oral BID  ? anastrozole  1 mg Oral QPM  ? apixaban  5 mg Oral BID  ? digoxin  0.125 mg Oral Daily  ? furosemide  80 mg Intravenous BID  ? mexiletine  200 mg Oral Q12H  ? pantoprazole  40 mg Oral Daily  ? spironolactone  25 mg Oral Daily  ? traZODone  50 mg Oral QHS  ? ? ?Infusions: ? ? ?PRN Medications: ? ? ? ?Patient Profile  ? ?61 y.o. femal with chornic systolic CHF, NICM, PAF/ectopic atrial tach, mitral regurgitation, hyperthyroidism, PVCs, Admitted with a/c systolic CHF. ? ?Assessment/Plan  ? ?1. Chronic systolic HF: NICM, normal cors in 03/2017. 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  ?- Echo (03/2017):  EF 15-20%  ?- TEE (11/20): EF 5% in setting of AF ?- Echo (2/21): EF 30-35% ?- Echo (4/22): 20-25%  ?- RHC (7/22): markedly elevated filling pressures and preserved CO, prominent v-waves ?- Echo (2/23): EF  20%, severe LV dysfunction, grade III DD, RV moderately reduced, moderate MR, TR moderate to severe ?- Admitted with marked volume overload. BNP >1800 ?- Volume status much improved. Now looks euvolemic ?- Plan for RHC tomorrow am to assess filling pressures and CO ?- No beta blocker with possible low output ?- Intolerant to [place].  ?- Continue digoxin and spiro  ?- BP has been too low for ARB/ARNI. SB P remains in 90s this am.  ?- Have not proceeded with ICD given no insurance and she is not a citizen.  ?- Consider cardiac MRI this admit ?- Suspect may be low output at times but also has significant MR. Likely will need cMRI and RHC next week.  Unfortunately, not candidate for advanced therapies given immigration status.  ?  ?2. PAF/ectopic atrial tachycardia ?- S/p previous DC-CV in 7/20. 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   ?- Previously was on amio for PVCs but developed hyperthyroidism, treated w/ methimazole.  ?- 06/2019 recurrent AF w/ RVR. Loaded w/amio. Evaluated by EP. Initial plans were for PVI ablation but cancelled after TEE showed EF 5% and severe LAE. Pt underwent DCCV instead.   ?- Saw Dr. Rayann Heman again in 2/21. Planned for AF ablation but does not have insurance coverage for AF ablation.  ?- Continue medical treatment. No good option. HFSW helping with resources. ?-SR today ?- Continue amiodarone 200 mg twice a day . ' ?- Continue Eliquis 5 bid. No obvious bleeding  ?  ?3. Moderate to severe mitral regurgitation ?- Prominent v-waves in PCWP tracing on RHC 7/22. ?- Moderate on echo 2/23. ?  ?4. 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  ?- Per Dr. Jasmine December, no further intervention needed. ?- Follow amio labs. TSH okay today.  ?  ?5. H/o Frequent PVCs ?- Zio patch (8/20) with 5% PVC burden; Zio patch (3 days)  ?-11/21 with 16% PVC burden. ?- Zio (5/22) w/ only occasional PVCs, burden 3.3% on amio + mexilitine. ?- Minimal PVCs this am, increased burden this afternoon ?- Continue home  mexiletine 200 mg BID ?- Continue amiodarone 200 mg twice a day.  ?- Not candidate for ablation due to severe MR and LAE ?  ?6. Left Breast Cancer ?- Treated w/ XRT. ?  ?7. Adenocarcinoma in situ of cervix  ?-  Followed by [facility]. ?- s/p hysertectomy. ? ?8. Elevated LFTs ?- Recent HIDA negative ?- US abdomen heterogenous echotexture and subtle nodularity of liver, possible early cirrhosis. ? D/t right heart failure ?- Mildly elevated AST/ALT. Due to hepatic congestion  ?- Improving with diuresis ? ?9. Hypokalemia ?- supp ?- continue spiro  ? ?Length of Stay: 2 ? ?Glori Bickers, MD  ?12/20/2021, 10:54 AM ? ?Advanced Heart Failure Team ?Pager [phone] (M-F; 7a - 5p)  ?Please contact [facility] for night-coverage after hours (5p -7a ) and weekends on [email or web address] ? ? ? ?  ?

## 2021-12-20 NOTE — Progress Notes (Signed)
?PROGRESS NOTE ? ? 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 poor medication compliance [?   ?  ?Last EF 09/2021 improved to 20% with grade 3 diastolic dysfunction--- at baseline she has NYHA class IIIb symptoms ? ?Recent outpatient work-up for abdominal discomfort with CT scan chest abdomen pelvis showing congestive hepatopathy HIDA scan negative and came to ED with acute superimposed on chronic systolic heart failure on 4/28 ?Found to have low normal pressures ?CXR showed congestion LFTs elevated BNP 1800 ?Cardiology was consulted and patient was seen by Dr. Jonne Ply ?  ?AHF team seeing as well tday---looks like she is ~ 145 lb as OP--last wght in clinic =141 lb 12/01/21 ?She was diuresed in the ED--is now 136 ?  ?HF added lasix 80 bid ? ? ?Hospital-Problem based course ? 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 ?DVT prophylaxis: Eliquis ?Code Status:  Full ?Family Communication: Called daughter on the phone ?Disposition:  ?Status is: Inpatient ?Remains inpatient appropriate because: Not stable and needs cardiology input probably right heart cath and cardiac MRI ?  ?Consultants:  ?Advanced heart failure team ? ?Procedures: Multiple ? ?Antimicrobials: None ? ? ?Subjective: ? ?Well some anxiety no CP ? ?Objective: ?Vitals:  ? 12/20/21 0435 12/20/21 0553 12/20/21 0728 12/20/21 1118  ?BP: (!) 80/61 (!) '86/66 91/67 95/67 '$  ?Pulse: 74  84 87  ?Resp: '20 18 17 17  '$ ?Temp: 97.6 ?F (36.4 ?C)  97.8 ?F (36.6 ?C) 97.9 ?F (36.6 ?C)  ?TempSrc: Oral  Oral Oral  ?SpO2: 94% 95% 95% 95%  ?Weight:      ?Height:      ? ? ?Intake/Output Summary (Last 24 hours) at 12/20/2021 1451 ?Last data filed at 12/20/2021 1415 ?Gross per 24 hour  ?Intake 940 ml  ?Output 2300 ml  ?Net -1360 ml  ? ? ?Filed Weights  ? 12/18/21 1158 12/19/21 0032 12/20/21 0027  ?Weight: 61.3 kg 60.5 kg 58.5 kg  ? ? ?Examination: ? ?Coherent pleasant ?JVD present, + hepatojug reflux ?No icterus no pallor ?Chest clear ?Mild lower extremity edema ?Neurologically intact moving all 4 limbs ?No lymphadenopathy ?ROM intact ? ?Data Reviewed: personally reviewed  ? ?CBC ?   ?Component Value Date/Time  ? WBC 6.0 12/20/2021 0245  ? RBC 4.38 12/20/2021 0245  ? HGB 12.8 12/20/2021 0245  ? HCT 38.0 12/20/2021 0245  ? PLT 306 12/20/2021 0245  ? MCV 86.8 12/20/2021 0245  ? MCH 29.2 12/20/2021 0245  ? MCHC 33.7 12/20/2021 0245  ? RDW 16.6 (H) 12/20/2021 0245  ? LYMPHSABS 0.5 (L) 12/18/2021 0615  ? MONOABS 0.5 12/18/2021 0615  ? EOSABS 0.1 12/18/2021 0615  ?  BASOSABS 0.1 12/18/2021 0615  ? ? ?  Latest Ref Rng & Units 12/20/2021  ?  2:45 AM 12/19/2021  ?  3:44 AM 12/18/2021  ?  6:15 AM  ?CMP  ?Glucose 70 - 99 mg/dL 96   99   97    ?BUN 8 - 23 mg/dL '14   12   13    '$ ?Creatinine 0.44 - 1.00 mg/dL 1.03   0.94   0.99    ?Sodium 135 - 145 mmol/L 135   136   134    ?Potassium 3.5 - 5.1 mmol/L 3.5   3.1   3.6    ?Chloride 98 - 111 mmol/L 97   102   102     ?CO2 22 - 32 mmol/L '28   25   22    '$ ?Calcium 8.9 - 10.3 mg/dL 9.3   8.6   8.6    ?Total Protein 6.5 - 8.1 g/dL 6.6   5.6   5.8    ?Total Bilirubin 0.3 - 1.2 mg/dL 1.0   0.9   1.6    ?Alkaline Phos 38 - 126 U/L 162   162   172    ?AST 15 - 41 U/L 31   46   99    ?ALT 0 - 44 U/L 67   82   111    ? ? ? ?Radiology Studies: ?No results found. ? ? ?Scheduled Meds: ? amiodarone  200 mg Oral BID  ? anastrozole  1 mg Oral QPM  ? apixaban  5 mg Oral BID  ? digoxin  0.125 mg Oral Daily  ? mexiletine  200 mg Oral Q12H  ? pantoprazole  40 mg Oral Daily  ? spironolactone  25 mg Oral Daily  ? traZODone  50 mg Oral QHS  ? ?Continuous Infusions: ? ? LOS: 2 days  ? ?Time spent: 20 ? ?Nita Sells, MD ?[facility] ?To contact the attending provider between 7A-7P or the covering provider during after hours 7P-7A, please log into the web site [email or web address] and access using universal [place] password for that web site. If you do not have the password, please call the hospital operator. ? ?12/20/2021, 2:51 PM  ? ? ?

## 2021-12-21 ENCOUNTER — Encounter (HOSPITAL_COMMUNITY)
Admission: EM | Disposition: A | Discharge status: Home / Self Care | Payer: Self-pay | Source: Home / Self Care | Attending: Family Medicine

## 2021-12-21 ENCOUNTER — Inpatient Hospital Stay (HOSPITAL_COMMUNITY): Payer: Self-pay

## 2021-12-21 DIAGNOSIS — I340 Nonrheumatic mitral (valve) insufficiency: Secondary | ICD-10-CM

## 2021-12-21 DIAGNOSIS — I3139 Other pericardial effusion (noninflammatory): Secondary | ICD-10-CM

## 2021-12-21 DIAGNOSIS — I361 Nonrheumatic tricuspid (valve) insufficiency: Secondary | ICD-10-CM

## 2021-12-21 HISTORY — PX: RIGHT HEART CATH: CATH118263

## 2021-12-21 LAB — COMPREHENSIVE METABOLIC PANEL
ALT: 52 U/L — ABNORMAL HIGH (ref 0–44)
AST: 27 U/L (ref 15–41)
Albumin: 3.9 g/dL (ref 3.5–5.0)
Alkaline Phosphatase: 162 U/L — ABNORMAL HIGH (ref 38–126)
Anion gap: 11 (ref 5–15)
BUN: 18 mg/dL (ref 8–23)
CO2: 28 mmol/L (ref 22–32)
Calcium: 9.4 mg/dL (ref 8.9–10.3)
Chloride: 94 mmol/L — ABNORMAL LOW (ref 98–111)
Creatinine, Ser: 1.2 mg/dL — ABNORMAL HIGH (ref 0.44–1.00)
GFR, Estimated: 52 mL/min — ABNORMAL LOW (ref >60)
Glucose, Bld: 94 mg/dL (ref 70–99)
Potassium: 3.3 mmol/L — ABNORMAL LOW (ref 3.5–5.1)
Sodium: 133 mmol/L — ABNORMAL LOW (ref 135–145)
Total Bilirubin: 1.1 mg/dL (ref 0.3–1.2)
Total Protein: 7 g/dL (ref 6.5–8.1)

## 2021-12-21 LAB — POCT I-STAT EG7
Acid-Base Excess: 6 mmol/L — ABNORMAL HIGH (ref 0.0–2.0)
Acid-Base Excess: 6 mmol/L — ABNORMAL HIGH (ref 0.0–2.0)
Bicarbonate: 29.2 mmol/L — ABNORMAL HIGH (ref 20.0–28.0)
Bicarbonate: 29.6 mmol/L — ABNORMAL HIGH (ref 20.0–28.0)
Calcium, Ion: 1.1 mmol/L — ABNORMAL LOW (ref 1.15–1.40)
Calcium, Ion: 1.15 mmol/L (ref 1.15–1.40)
HCT: 47 % — ABNORMAL HIGH (ref 36.0–46.0)
HCT: 47 % — ABNORMAL HIGH (ref 36.0–46.0)
Hemoglobin: 16 g/dL — ABNORMAL HIGH (ref 12.0–15.0)
Hemoglobin: 16 g/dL — ABNORMAL HIGH (ref 12.0–15.0)
O2 Saturation: 74 %
O2 Saturation: 75 %
Potassium: 4.1 mmol/L (ref 3.5–5.1)
Potassium: 4.3 mmol/L (ref 3.5–5.1)
Sodium: 133 mmol/L — ABNORMAL LOW (ref 135–145)
Sodium: 134 mmol/L — ABNORMAL LOW (ref 135–145)
TCO2: 30 mmol/L (ref 22–32)
TCO2: 31 mmol/L (ref 22–32)
pCO2, Ven: 38.3 mmHg — ABNORMAL LOW (ref 44–60)
pCO2, Ven: 38.7 mmHg — ABNORMAL LOW (ref 44–60)
pH, Ven: 7.49 — ABNORMAL HIGH (ref 7.25–7.43)
pH, Ven: 7.492 — ABNORMAL HIGH (ref 7.25–7.43)
pO2, Ven: 36 mmHg (ref 32–45)
pO2, Ven: 37 mmHg (ref 32–45)

## 2021-12-21 LAB — MAGNESIUM: Magnesium: 2.2 mg/dL (ref 1.7–2.4)

## 2021-12-21 LAB — POCT I-STAT 7, (LYTES, BLD GAS, ICA,H+H)
Acid-Base Excess: 5 mmol/L — ABNORMAL HIGH (ref 0.0–2.0)
Bicarbonate: 28.8 mmol/L — ABNORMAL HIGH (ref 20.0–28.0)
Calcium, Ion: 1.07 mmol/L — ABNORMAL LOW (ref 1.15–1.40)
HCT: 46 % (ref 36.0–46.0)
Hemoglobin: 15.6 g/dL — ABNORMAL HIGH (ref 12.0–15.0)
O2 Saturation: 76 %
Potassium: 4 mmol/L (ref 3.5–5.1)
Sodium: 136 mmol/L (ref 135–145)
TCO2: 30 mmol/L (ref 22–32)
pCO2 arterial: 39.2 mmHg (ref 32–48)
pH, Arterial: 7.474 — ABNORMAL HIGH (ref 7.35–7.45)
pO2, Arterial: 38 mmHg — CL (ref 83–108)

## 2021-12-21 LAB — CBC
HCT: 41.1 % (ref 36.0–46.0)
Hemoglobin: 13.6 g/dL (ref 12.0–15.0)
MCH: 28.3 pg (ref 26.0–34.0)
MCHC: 33.1 g/dL (ref 30.0–36.0)
MCV: 85.6 fL (ref 80.0–100.0)
Platelets: 351 10*3/uL (ref 150–400)
RBC: 4.8 MIL/uL (ref 3.87–5.11)
RDW: 16.2 % — ABNORMAL HIGH (ref 11.5–15.5)
WBC: 5.9 10*3/uL (ref 4.0–10.5)
nRBC: 0 % (ref 0.0–0.2)

## 2021-12-21 SURGERY — RIGHT HEART CATH
Anesthesia: LOCAL

## 2021-12-21 MED ORDER — LIDOCAINE HCL (PF) 1 % IJ SOLN
INTRAMUSCULAR | Status: AC
  Filled 2021-12-21: qty 30

## 2021-12-21 MED ORDER — ASPIRIN 81 MG PO CHEW
81.0000 mg | CHEWABLE_TABLET | ORAL | Status: AC
  Administered 2021-12-21: 81 mg via ORAL
  Filled 2021-12-21: qty 1

## 2021-12-21 MED ORDER — HEPARIN (PORCINE) IN NACL 1000-0.9 UT/500ML-% IV SOLN
INTRAVENOUS | Status: DC | PRN
  Administered 2021-12-21: 500 mL

## 2021-12-21 MED ORDER — HEPARIN (PORCINE) IN NACL 1000-0.9 UT/500ML-% IV SOLN
INTRAVENOUS | Status: AC
  Filled 2021-12-21: qty 500

## 2021-12-21 MED ORDER — ASPIRIN 81 MG PO CHEW: 81.0000 mg | CHEWABLE_TABLET | ORAL | Status: DC

## 2021-12-21 MED ORDER — POTASSIUM CHLORIDE CRYS ER 20 MEQ PO TBCR
40.0000 meq | EXTENDED_RELEASE_TABLET | Freq: Two times a day (BID) | ORAL | Status: DC
  Administered 2021-12-21 – 2021-12-24 (×6): 40 meq via ORAL
  Filled 2021-12-21 (×6): qty 2

## 2021-12-21 MED ORDER — GADOBUTROL 1 MMOL/ML IV SOLN
8.0000 mL | Freq: Once | INTRAVENOUS | Status: AC | PRN
  Administered 2021-12-21: 8 mL via INTRAVENOUS

## 2021-12-21 MED ORDER — POTASSIUM CHLORIDE CRYS ER 20 MEQ PO TBCR: 40.0000 meq | EXTENDED_RELEASE_TABLET | Freq: Once | ORAL | Status: DC

## 2021-12-21 MED ORDER — BUSPIRONE HCL 5 MG PO TABS
5.0000 mg | ORAL_TABLET | Freq: Two times a day (BID) | ORAL | Status: DC
  Administered 2021-12-21 – 2021-12-25 (×8): 5 mg via ORAL
  Filled 2021-12-21 (×8): qty 1

## 2021-12-21 MED ORDER — SODIUM CHLORIDE 0.9 % IV SOLN: 250.0000 mL | INTRAVENOUS | Status: DC | PRN

## 2021-12-21 MED ORDER — FUROSEMIDE 10 MG/ML IJ SOLN
80.0000 mg | Freq: Two times a day (BID) | INTRAMUSCULAR | Status: AC
  Administered 2021-12-21 – 2021-12-23 (×5): 80 mg via INTRAVENOUS
  Filled 2021-12-21 (×5): qty 8

## 2021-12-21 MED ORDER — LIDOCAINE HCL (PF) 1 % IJ SOLN
INTRAMUSCULAR | Status: DC | PRN
Start: 2021-12-21 — End: 2021-12-21
  Administered 2021-12-21: 2 mL

## 2021-12-21 MED ORDER — SODIUM CHLORIDE 0.9 % IV SOLN: INTRAVENOUS | Status: DC

## 2021-12-21 MED ORDER — POTASSIUM CHLORIDE CRYS ER 20 MEQ PO TBCR
40.0000 meq | EXTENDED_RELEASE_TABLET | Freq: Once | ORAL | Status: AC
  Administered 2021-12-21: 40 meq via ORAL
  Filled 2021-12-21: qty 2

## 2021-12-21 MED ORDER — SODIUM CHLORIDE 0.9% FLUSH: 3.0000 mL | INTRAVENOUS | Status: DC | PRN

## 2021-12-21 MED ORDER — SODIUM CHLORIDE 0.9% FLUSH: 3.0000 mL | Freq: Two times a day (BID) | INTRAVENOUS | Status: DC

## 2021-12-21 SURGICAL SUPPLY — 9 items
CATH BALLN WEDGE 5F 110CM (CATHETERS) ×1 IMPLANT
PACK CARDIAC CATHETERIZATION (CUSTOM PROCEDURE TRAY) ×2 IMPLANT
PROTECTION STATION PRESSURIZED (MISCELLANEOUS) ×2
SHEATH GLIDE SLENDER 4/5FR (SHEATH) ×1 IMPLANT
STATION PROTECTION PRESSURIZED (MISCELLANEOUS) IMPLANT
TRANSDUCER W/STOPCOCK (MISCELLANEOUS) ×2 IMPLANT
TUBING ART PRESS 72  MALE/FEM (TUBING) ×1
TUBING ART PRESS 72 MALE/FEM (TUBING) IMPLANT
WIRE EMERALD 3MM-J .025X260CM (WIRE) ×1 IMPLANT

## 2021-12-21 NOTE — Progress Notes (Signed)
? ? Advanced Heart Failure Rounding Note ? ?PCP-Cardiologist: Glori Bickers, MD  ? ?Subjective:   ? ?Remains on lasix 80 IV bid. Weight down another 2 pounds. Total 8 pounds ? ?Breathing better. No orthopnea or PND ? ?Scr  1.0 -> 1.2  K 3.3  ? ?Objective:   ?Weight Range: ?57.9 kg ?Body mass index is 27.61 kg/m?.  ? ?Vital Signs:   ?Temp:  [97.7 ?F (36.5 ?C)-98.7 ?F (37.1 ?C)] 98.1 ?F (36.7 ?C) (05/01 3536) ?Pulse Rate:  [73-87] 81 (05/01 0718) ?Resp:  [16-17] 17 (05/01 0718) ?BP: (87-104)/(67-77) 97/72 (05/01 1443) ?SpO2:  [95 %-100 %] 97 % (05/01 0718) ?Weight:  [57.9 kg] 57.9 kg (05/01 0040) ?Last BM Date : 12/19/21 ? ?Weight change: ?Filed Weights  ? 12/19/21 0032 12/20/21 0027 12/21/21 0040  ?Weight: 60.5 kg 58.5 kg 57.9 kg  ? ? ?Intake/Output:  ? ?Intake/Output Summary (Last 24 hours) at 12/21/2021 0826 ?Last data filed at 12/21/2021 0801 ?Gross per 24 hour  ?Intake 580 ml  ?Output 1350 ml  ?Net -770 ml  ? ?  ? ? ?Physical Exam  ?  ?General:  Sitting up in bed No resp difficulty ?HEENT: normal ?Neck: supple. no JVD. Carotids 2+ bilat; no bruits. No lymphadenopathy or thryomegaly appreciated. ?Cor: PMI nondisplaced. Regular rate & rhythm. 2/6 MR ?Lungs: clear ?Abdomen: soft, nontender, nondistended. No hepatosplenomegaly. No bruits or masses. Good bowel sounds. ?Extremities: no cyanosis, clubbing, rash, edema ?Neuro: alert & orientedx3, cranial nerves grossly intact. moves all 4 extremities w/o difficulty. Affect pleasant ? ? ? ? ?Telemetry  ? ?Sinus 80-90s Personally reviewed ? ?Labs  ?  ?CBC ?Recent Labs  ?  12/20/21 ?1540 12/21/21 ?0153  ?WBC 6.0 5.9  ?HGB 12.8 13.6  ?HCT 38.0 41.1  ?MCV 86.8 85.6  ?PLT 306 351  ? ? ?Basic Metabolic Panel ?Recent Labs  ?  12/19/21 ?0344 12/20/21 ?0867 12/21/21 ?0153  ?NA 136 135 133*  ?K 3.1* 3.5 3.3*  ?CL 102 97* 94*  ?CO2 '25 28 28  '$ ?GLUCOSE 99 96 94  ?BUN '12 14 18  '$ ?CREATININE 0.94 1.03* 1.20*  ?CALCIUM 8.6* 9.3 9.4  ?MG 2.1  --  2.2  ? ? ?Liver Function Tests ?Recent  Labs  ?  12/20/21 ?6195 12/21/21 ?0153  ?AST 31 27  ?ALT 67* 52*  ?ALKPHOS 162* 162*  ?BILITOT 1.0 1.1  ?PROT 6.6 7.0  ?ALBUMIN 3.7 3.9  ? ? ?No results for input(s): LIPASE, AMYLASE in the last 72 hours. ? ?Cardiac Enzymes ?No results for input(s): CKTOTAL, CKMB, CKMBINDEX, TROPONINI in the last 72 hours. ? ?BNP: ?BNP (last 3 results) ?Recent Labs  ?  12/01/21 ?1526 12/10/21 ?0751 12/17/21 ?2142  ?BNP 1,546.2* 1,571.6* 1,840.6*  ? ? ? ?ProBNP (last 3 results) ?No results for input(s): PROBNP in the last 8760 hours. ? ? ?D-Dimer ?No results for input(s): DDIMER in the last 72 hours. ?Hemoglobin A1C ?No results for input(s): HGBA1C in the last 72 hours. ?Fasting Lipid Panel ?No results for input(s): CHOL, HDL, LDLCALC, TRIG, CHOLHDL, LDLDIRECT in the last 72 hours. ?Thyroid Function Tests ?No results for input(s): TSH, T4TOTAL, T3FREE, THYROIDAB in the last 72 hours. ? ?Invalid input(s): FREET3 ? ? ?Other results: ? ? ?Imaging  ? ? ?No results found. ? ? ?Medications:   ? ? ?Scheduled Medications: ? amiodarone  200 mg Oral BID  ? anastrozole  1 mg Oral QPM  ? apixaban  5 mg Oral BID  ? digoxin  0.125 mg Oral Daily  ?  mexiletine  200 mg Oral Q12H  ? pantoprazole  40 mg Oral Daily  ? potassium chloride  40 mEq Oral Once  ? sodium chloride flush  3 mL Intravenous Q12H  ? spironolactone  25 mg Oral Daily  ? traZODone  50 mg Oral QHS  ? ? ?Infusions: ? sodium chloride    ? sodium chloride 10 mL/hr at 12/21/21 3614  ? ? ?PRN Medications: ? ? ? ?Patient Profile  ? ?61 y.o. femal with chornic systolic CHF, NICM, PAF/ectopic atrial tach, mitral regurgitation, hyperthyroidism, PVCs, Admitted with a/c systolic CHF. ? ?Assessment/Plan  ? ?1. Chronic systolic HF: NICM, normal cors in 03/2017. 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  ?- Echo (03/2017):  EF 15-20%  ?- TEE (11/20): EF 5% in setting of AF ?- Echo (2/21): EF 30-35% ?- Echo (4/22): 20-25%  ?- RHC (7/22): markedly elevated filling pressures and preserved CO, prominent v-waves ?-  Echo (2/23): EF 20%, severe LV dysfunction, grade III DD, RV moderately reduced, moderate MR, TR moderate to severe ?- Admitted with marked volume overload. BNP >1800 ?- Volume status much improved. Now looks euvolemic ?- Plan for RHC today  ?- No beta blocker with possible low output ?- Intolerant to [place].  ?- Continue digoxin and spiro  ?- BP has been too low for ARB/ARNI. SBP remains in 90s today ?- Have not proceeded with ICD given no insurance and she is not a citizen.  ?- Consider cardiac MRI this admit ?- Suspect may be low output at times but also has significant MR.  Unfortunately, not candidate for advanced therapies given immigration status. RHC and cMRI today ?  ?2. PAF/ectopic atrial tachycardia ?- S/p previous DC-CV in 7/20. 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   ?- Previously was on amio for PVCs but developed hyperthyroidism, treated w/ methimazole.  ?- 06/2019 recurrent AF w/ RVR. Loaded w/amio. Evaluated by EP. Initial plans were for PVI ablation but cancelled after TEE showed EF 5% and severe LAE. Pt underwent DCCV instead.   ?- Saw Dr. Rayann Heman again in 2/21. Planned for AF ablation but does not have insurance coverage for AF ablation.  ?- Continue medical treatment. No good option. HFSW helping with resources. ?-SR today ?- Continue amiodarone 200 mg twice a day . ' ?- Continue Eliquis 5 bid. No obvious bleeding  ?  ?3. Moderate to severe mitral regurgitation ?- Prominent v-waves in PCWP tracing on RHC 7/22. ?- Moderate on echo 2/23. ?  ?4. 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  ?- Per Dr. Jasmine December, no further intervention needed. ?- Follow amio labs. TSH okay today.  ?  ?5. H/o Frequent PVCs ?- Zio patch (8/20) with 5% PVC burden; Zio patch (3 days)  ?-11/21 with 16% PVC burden. ?- Zio (5/22) w/ only occasional PVCs, burden 3.3% on amio + mexilitine. ?- PVCs suppressed on tele ?- Continue home mexiletine 200 mg BID ?- Continue amiodarone 200 mg twice a day.  ?- Not  candidate for ablation due to severe MR and LAE ?  ?6. Left Breast Cancer ?- Treated w/ XRT. ?  ?7. Adenocarcinoma in situ of cervix  ?- Followed by [facility]. ?- s/p hysertectomy. ? ?8. Elevated LFTs ?- Recent HIDA negative ?- US abdomen heterogenous echotexture and subtle nodularity of liver, possible early cirrhosis. ? D/t right heart failure ?- Mildly elevated AST/ALT. Due to hepatic congestion  ?- Improved with diuresis ? ?9. Hypokalemia ?- K 3.3 supp ?- continue spiro  ? ?Length of Stay: 3 ? ?  Glori Bickers, MD  ?12/21/2021, 8:26 AM ? ?Advanced Heart Failure Team ?Pager [phone] (M-F; 7a - 5p)  ?Please contact [facility] for night-coverage after hours (5p -7a ) and weekends on [email or web address] ? ? ? ?  ?

## 2021-12-21 NOTE — Interval H&P Note (Signed)
History and Physical Interval Note: ? ?12/21/2021 ?7:44 AM ? ?Megan Keith  has presented today for surgery, with the diagnosis of acute systolic heart failure.  The various methods of treatment have been discussed with the patient and family. After consideration of risks, benefits and other options for treatment, the patient has consented to  Procedure(s): ?RIGHT HEART CATH (N/A) as a surgical intervention.  The patient's history has been reviewed, patient examined, no change in status, stable for surgery.  I have reviewed the patient's chart and labs.  Questions were answered to the patient's satisfaction.   ? ? ?[name] ? ? ?

## 2021-12-21 NOTE — TOC CM/SW Note (Addendum)
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 Unable to complete Medicaid application. Will arrange follow up appt with PCP and assist pt with medication with HF fund/MATCH. Jonnie Finner RN3 CCM, Heart Failure TOC CM [phone]  ? ?Referral sent to [facility] for Medicaid application. HF TOC CM review chart. Pt currently without insurance. Will need assistance with PCP, and meds. Jonnie Finner RN3 CCM, Heart Failure TOC CM [phone]  ?

## 2021-12-21 NOTE — Progress Notes (Signed)
____________________________________________________________ ? ?Attending physician addendum: ? ?Thank you for sending this case to me and for discussing it during the clinic session. ?I have reviewed the entire note and agree with your assessment that this patient's symptoms are cardiopulmonary in nature. ? ?Of note, she remains admitted to the hospital for treatment of volume overload/CHF. ? ?Wilfrid Lund, MD ? ?____________________________________________________________ ? ?

## 2021-12-21 NOTE — Progress Notes (Signed)
?PROGRESS NOTE ? ? 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 poor medication compliance [?   ?  ?Last EF 09/2021 improved to 20% with grade 3 diastolic dysfunction--- at baseline she has NYHA class IIIb symptoms ? ?Recent outpatient work-up for abdominal discomfort with CT scan chest abdomen pelvis showing congestive hepatopathy HIDA scan negative and came to ED with acute superimposed on chronic systolic heart failure on 4/28 ?Found to have low normal pressures ?CXR showed congestion LFTs elevated BNP 1800 ?Cardiology was consulted and patient was seen by Dr. Jonne Ply ?  ?AHF team seeing as well tday---looks like she is ~ 145 lb as OP--last wght in clinic =141 lb 12/01/21 ?She was diuresed in the ED--is now 136 ?  ?HF added lasix 80 bid ?5/1: Cardiac Cath ? ?Hospital-Problem based course ? 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 ?DVT prophylaxis: Eliquis ?Code Status: Full ?Family Communication: Called daughter on the phone ?Disposition:  ?Status is: Inpatient ?Remains inpatient appropriate because: Not stable and needs cardiology input probably right heart cath and cardiac MRI ?  ?Consultants:  ?Advanced heart failure team ? ?Procedures: Multiple ? ?Antimicrobials: None ? ? ?Subjective: ? ?Anxious ?Reports L shoulder pain--seems MSK ?Some abd discomfort and back pain also ? ?Objective: ?Vitals:  ? 12/21/21 1545 12/21/21 1600 12/21/21 1630 12/21/21 1700  ?BP: 112/79 (!) 109/44 103/66 119/73  ?Pulse: 81 82 79 97  ?Resp:      ?Temp:      ?TempSrc:      ?SpO2: 100% 99% 96% 97%  ?Weight:      ?Height:      ? ? ?Intake/Output Summary (Last 24 hours) at 12/21/2021 1832 ?Last data filed at 12/21/2021 1803 ?Gross per 24 hour  ?Intake 1078.52 ml  ?Output 550 ml  ?Net 528.52 ml  ? ? ?Filed Weights  ? 12/19/21 0032 12/20/21 0027 12/21/21 0040  ?Weight: 60.5 kg 58.5 kg 57.9 kg  ? ? ?Examination: ? ?Coherent pleasant ?JVD present, + hepatojug reflux ?Ctab ?Restriction ROM with ext rotation and shoulder flexion ?Trace lower extremity edema ? ? ?Data Reviewed: personally reviewed  ? ?CBC ?   ?Component Value Date/Time  ? WBC 5.9 12/21/2021 0153  ? RBC 4.80 12/21/2021 0153  ? HGB 13.6 12/21/2021 0153  ? HCT 41.1 12/21/2021 0153  ? PLT 351 12/21/2021 0153  ? MCV 85.6 12/21/2021 0153  ? MCH 28.3 12/21/2021 0153  ? MCHC 33.1 12/21/2021 0153  ? RDW 16.2 (H) 12/21/2021 0153  ? LYMPHSABS 0.5 (L) 12/18/2021 0615  ?  MONOABS 0.5 12/18/2021 0615  ? EOSABS 0.1 12/18/2021 0615  ? BASOSABS 0.1 12/18/2021 0615  ? ? ?  Latest Ref Rng & Units 12/21/2021  ?  1:53 AM 12/20/2021  ?  2:45 AM 12/19/2021  ?  3:44 AM  ?CMP  ?Glucose 70 - 99 mg/dL 94   96   99    ?BUN 8 - 23 mg/dL '18   14   12    '$ ?Creatinine 0.44 - 1.00 mg/dL 1.20   1.03   0.94    ?Sodium 135 - 145 mmol/L 133   135   136    ?Potassium 3.5 - 5.1  mmol/L 3.3   3.5   3.1    ?Chloride 98 - 111 mmol/L 94   97   102    ?CO2 22 - 32 mmol/L '28   28   25    '$ ?Calcium 8.9 - 10.3 mg/dL 9.4   9.3   8.6    ?Total Protein 6.5 - 8.1 g/dL 7.0   6.6   5.6    ?Total Bilirubin 0.3 - 1.2 mg/dL 1.1   1.0   0.9    ?Alkaline Phos 38 - 126 U/L 162   162   162    ?AST 15 - 41 U/L 27   31   46    ?ALT 0 - 44 U/L 52   67   82    ? ? ? ?Radiology Studies: ?No results found. ? ? ?Scheduled Meds: ? amiodarone  200 mg Oral BID  ? anastrozole  1 mg Oral QPM  ? apixaban  5 mg Oral BID  ? busPIRone  5 mg Oral BID  ? digoxin  0.125 mg Oral Daily  ? furosemide  80 mg Intravenous BID  ? mexiletine  200 mg Oral Q12H  ? pantoprazole  40 mg Oral Daily  ? potassium chloride  40 mEq Oral BID  ? spironolactone  25 mg Oral Daily  ? traZODone  50 mg Oral QHS  ? ?Continuous Infusions: ? ? LOS: 3 days  ? ?Time spent: 35 ? ?Nita Sells, MD ?[facility] ?To contact the attending provider between 7A-7P or the covering provider during after hours 7P-7A, please log into the web site [email or web address] and access using universal [place] password for that web site. If you do not have the password, please call the hospital operator. ? ?12/21/2021, 6:32 PM  ? ? ?

## 2021-12-21 NOTE — Plan of Care (Signed)

## 2021-12-21 NOTE — Plan of Care (Signed)
?  Problem: Health Behavior/Discharge Planning: ?Goal: Ability to manage health-related needs will improve ?Outcome: Progressing ?  ?Problem: Clinical Measurements: ?Goal: Ability to maintain clinical measurements within normal limits will improve ?Outcome: Progressing ?Goal: Will remain free from infection ?Outcome: Progressing ?Goal: Diagnostic test results will improve ?Outcome: Progressing ?Goal: Respiratory complications will improve ?Outcome: Progressing ?  ?Problem: Activity: ?Goal: Risk for activity intolerance will decrease ?Outcome: Progressing ?  ?Problem: Nutrition: ?Goal: Adequate nutrition will be maintained ?Outcome: Progressing ?  ?Problem: Coping: ?Goal: Level of anxiety will decrease ?Outcome: Progressing ?  ?Problem: Elimination: ?Goal: Will not experience complications related to bowel motility ?Outcome: Progressing ?Goal: Will not experience complications related to urinary retention ?Outcome: Progressing ?  ?Problem: Pain Managment: ?Goal: General experience of comfort will improve ?Outcome: Progressing ?  ?Problem: Safety: ?Goal: Ability to remain free from injury will improve ?Outcome: Progressing ?  ?Problem: Skin Integrity: ?Goal: Risk for impaired skin integrity will decrease ?Outcome: Progressing ?  ?Problem: Education: ?Goal: Ability to demonstrate management of disease process will improve ?Outcome: Progressing ?Goal: Ability to verbalize understanding of medication therapies will improve ?Outcome: Progressing ?Goal: Individualized Educational Video(s) ?Outcome: Progressing ?  ?Problem: Activity: ?Goal: Capacity to carry out activities will improve ?Outcome: Progressing ?  ?

## 2021-12-22 ENCOUNTER — Encounter (HOSPITAL_COMMUNITY): Payer: Self-pay | Admitting: Internal Medicine

## 2021-12-22 LAB — COMPREHENSIVE METABOLIC PANEL
ALT: 38 U/L (ref 0–44)
AST: 26 U/L (ref 15–41)
Albumin: 4.1 g/dL (ref 3.5–5.0)
Alkaline Phosphatase: 155 U/L — ABNORMAL HIGH (ref 38–126)
Anion gap: 13 (ref 5–15)
BUN: 19 mg/dL (ref 8–23)
CO2: 24 mmol/L (ref 22–32)
Calcium: 9.5 mg/dL (ref 8.9–10.3)
Chloride: 96 mmol/L — ABNORMAL LOW (ref 98–111)
Creatinine, Ser: 1.07 mg/dL — ABNORMAL HIGH (ref 0.44–1.00)
GFR, Estimated: 59 mL/min — ABNORMAL LOW (ref >60)
Glucose, Bld: 94 mg/dL (ref 70–99)
Potassium: 4.3 mmol/L (ref 3.5–5.1)
Sodium: 133 mmol/L — ABNORMAL LOW (ref 135–145)
Total Bilirubin: 0.9 mg/dL (ref 0.3–1.2)
Total Protein: 7.1 g/dL (ref 6.5–8.1)

## 2021-12-22 LAB — CBC
HCT: 45.6 % (ref 36.0–46.0)
Hemoglobin: 14.6 g/dL (ref 12.0–15.0)
MCH: 27.8 pg (ref 26.0–34.0)
MCHC: 32 g/dL (ref 30.0–36.0)
MCV: 86.7 fL (ref 80.0–100.0)
Platelets: 380 10*3/uL (ref 150–400)
RBC: 5.26 MIL/uL — ABNORMAL HIGH (ref 3.87–5.11)
RDW: 16.4 % — ABNORMAL HIGH (ref 11.5–15.5)
WBC: 5.9 10*3/uL (ref 4.0–10.5)
nRBC: 0 % (ref 0.0–0.2)

## 2021-12-22 LAB — PROTIME-INR
INR: 1.6 — ABNORMAL HIGH (ref 0.8–1.2)
Prothrombin Time: 18.4 seconds — ABNORMAL HIGH (ref 11.4–15.2)

## 2021-12-22 LAB — MAGNESIUM: Magnesium: 2.3 mg/dL (ref 1.7–2.4)

## 2021-12-22 MED ORDER — CAPSAICIN 0.025 % EX CREA
TOPICAL_CREAM | Freq: Two times a day (BID) | CUTANEOUS | Status: DC
  Filled 2021-12-22: qty 60

## 2021-12-22 MED ORDER — METOLAZONE 2.5 MG PO TABS
2.5000 mg | ORAL_TABLET | Freq: Once | ORAL | Status: AC
  Administered 2021-12-22: 2.5 mg via ORAL
  Filled 2021-12-22: qty 1

## 2021-12-22 NOTE — Progress Notes (Addendum)
? ? Advanced Heart Failure Rounding Note ? ?PCP-Cardiologist: Glori Bickers, MD  ? ?Subjective:   ?[place] 12/21/21  ?RA = 4 ?RV = 59/7 ?PA = 62/30 (42) ?PCW = 30 (v waves to 35-40) ?Fick cardiac output/index = 4.7/3.1 ?PVR = 2.6 WU ?Ao sat = 99% ?PA sat = 74%, 75% ?SVC sat = 76%  ?PAPi = 8.0 ? ?CMRI- Severe LV dilation. EF 17%, mod RV dysfunction.  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  ? ?Post cath continued to diurese with IV lasix. I/O not accurate. Weight down 1 pound.  ? ?Denies SOB.  ?  ? ?Objective:   ?Weight Range: ?57.4 kg ?Body mass index is 27.4 kg/m?.  ? ?Vital Signs:   ?Temp:  [98 ?F (36.7 ?C)-98.3 ?F (36.8 ?C)] 98 ?F (36.7 ?C) (05/02 0345) ?Pulse Rate:  [0-103] 77 (05/02 0700) ?Resp:  [15-26] 16 (05/02 0700) ?BP: (97-145)/(44-79) 145/71 (05/02 0700) ?SpO2:  [96 %-100 %] 100 % (05/02 0700) ?Weight:  [57.4 kg] 57.4 kg (05/02 0400) ?Last BM Date : 01/20/22 ? ?Weight change: ?Filed Weights  ? 12/20/21 0027 12/21/21 0040 12/22/21 0400  ?Weight: 58.5 kg 57.9 kg 57.4 kg  ? ? ?Intake/Output:  ? ?Intake/Output Summary (Last 24 hours) at 12/22/2021 0941 ?Last data filed at 12/22/2021 0700 ?Gross per 24 hour  ?Intake 838.52 ml  ?Output 1300 ml  ?Net -461.48 ml  ?  ? ? ?Physical Exam  ? General:  Sitting on the side of the bed. No resp difficulty ?HEENT: normal ?Neck: supple. no JVD. Carotids 2+ bilat; no bruits. No lymphadenopathy or thryomegaly appreciated. ?Cor: PMI nondisplaced. Regular rate & rhythm. No rubs, gallops. 2/6 MR . ?Lungs: clear ?Abdomen: soft, nontender, nondistended. No hepatosplenomegaly. No bruits or masses. Good bowel sounds. ?Extremities: no cyanosis, clubbing, rash, edema ?Neuro: alert & orientedx3, cranial nerves grossly intact. moves all 4 extremities w/o difficulty. Affect pleasant ? ? ? ? ?Telemetry  ? ?SR 80-90s  ? ?Labs  ?  ?CBC ?Recent Labs  ?  12/21/21 ?0153 12/21/21 ?1458 12/21/21 ?1503 12/22/21 ?0438  ?WBC 5.9  --   --  5.9  ?HGB  13.6   < > 15.6* 14.6  ?HCT 41.1   < > 46.0 45.6  ?MCV 85.6  --   --  86.7  ?PLT 351  --   --  380  ? < > = values in this interval not displayed.  ? ?Basic Metabolic Panel ?Recent Labs  ?  12/21/21 ?0153 12/21/21 ?1458 12/21/21 ?1503 12/22/21 ?0438  ?NA 133*   < > 136 133*  ?K 3.3*   < > 4.0 4.3  ?CL 94*  --   --  96*  ?CO2 28  --   --  24  ?GLUCOSE 94  --   --  94  ?BUN 18  --   --  19  ?CREATININE 1.20*  --   --  1.07*  ?CALCIUM 9.4  --   --  9.5  ?MG 2.2  --   --  2.3  ? < > = values in this interval not displayed.  ? ?Liver Function Tests ?Recent Labs  ?  12/21/21 ?0153 12/22/21 ?0438  ?AST 27 26  ?ALT 52* 38  ?ALKPHOS 162* 155*  ?BILITOT 1.1 0.9  ?PROT 7.0 7.1  ?ALBUMIN 3.9 4.1  ? ?No results for input(s): LIPASE, AMYLASE in the last 72 hours. ? ?Cardiac Enzymes ?No results for input(s): CKTOTAL, CKMB, CKMBINDEX,  TROPONINI in the last 72 hours. ? ?BNP: ?BNP (last 3 results) ?Recent Labs  ?  12/01/21 ?1526 12/10/21 ?0751 12/17/21 ?2142  ?BNP 1,546.2* 1,571.6* 1,840.6*  ? ? ?ProBNP (last 3 results) ?No results for input(s): PROBNP in the last 8760 hours. ? ? ?D-Dimer ?No results for input(s): DDIMER in the last 72 hours. ?Hemoglobin A1C ?No results for input(s): HGBA1C in the last 72 hours. ?Fasting Lipid Panel ?No results for input(s): CHOL, HDL, LDLCALC, TRIG, CHOLHDL, LDLDIRECT in the last 72 hours. ?Thyroid Function Tests ?No results for input(s): TSH, T4TOTAL, T3FREE, THYROIDAB in the last 72 hours. ? ?Invalid input(s): FREET3 ? ? ?Other results: ? ? ?Imaging  ? ? ?CARDIAC CATHETERIZATION ? ?Result Date: 12/21/2021 ?Findings: RA = 4 RV = 59/7 PA = 62/30 (42) PCW = 30 (v waves to 35-40) Fick cardiac output/index = 4.7/3.1 PVR = 2.6 WU Ao sat = 99% PA sat = 74%, 75% SVC sat = 76% PAPi = 8.0 Assessment: 1. Normal cardiac output with markedly elevated left-sided filling pressures. Plan/Discussion: Continue diuresis. Glori Bickers, MD 7:24 PM ? ?MR CARDIAC MORPHOLOGY W WO CONTRAST ? ?Result Date:  12/21/2021 ?CLINICAL DATA:  35F with NICM, EF 20% on echo EXAM: CARDIAC MRI TECHNIQUE: The patient was scanned on a 1.5 Tesla Siemens magnet. A dedicated cardiac coil was used. Functional imaging was done using Fiesta sequences. 2,3, and 4 chamber views were done to assess for RWMA's. Modified Simpson's rule using a short axis stack was used to calculate an ejection fraction on a dedicated work Conservation officer, nature. The patient received 8 cc of Gadavist. After 10 minutes inversion recovery sequences were used to assess for infiltration and scar tissue. CONTRAST:  8 cc  of Gadavist FINDINGS: Left ventricle: -Severe dilatation -Severe systolic dysfunction -ECV elevation (38%) -Lateral wall midwall LGE -RV insertion site LGE LV EF:  17% (Normal 56-78%) Absolute volumes: LV EDV: 227m (Normal 52-141 mL) LV ESV: 1752m(Normal 13-51 mL) LV SV: 3746mNormal 33-97 mL) CO: 3.4L/min (Normal 2.7-6.0 L/min) Indexed volumes: LV EDV: 140m50m-m (Normal 41-81 mL/sq-m) LV ESV: 116mL48mm (Normal 12-21 mL/sq-m) LV SV: 24mL/62m (Normal 26-56 mL/sq-m) CI: 2.2L/min/sq-m (Normal 1.8-3.8 L/min/sq-m) Right ventricle: Mild dilatation with moderate systolic dysfunction RV EF: 30% (Normal 47-80%) Absolute volumes: RV EDV: 145mL (41mal 58-154 mL) RV ESV: 101mL (N71ml 12-68 mL) RV SV: 44mL (No8m 35-98 mL) CO: 4.0L/min (Normal 2.7-6 L/min) Indexed volumes: RV EDV: 95mL/sq-m78mrmal 48-87 mL/sq-m) RV ESV: 66mL/sq-m 45mmal 11-28 mL/sq-m) RV SV: 29mL/sq-m (89mal 27-57 mL/sq-m) CI: 2.7L/min/sq-m (Normal 1.8-3.8 L/min/sq-m) Left atrium: Severe enlargement Right atrium: Mild enlargement Mitral valve: Moderate regurgitation (regurgitant fraction 32%) Aortic valve: Trivial regurgitation Tricuspid valve: Moderate to severe regurgitation Pulmonic valve: Trivial regurgitation Aorta: Normal proximal ascending aorta Pericardium: Moderate effusion measuring up to 12mm adjacen6m LV lateral wall IMPRESSION: 1.  Severe LV dilatation with severe  systolic dysfunction (EF 17%) 2.  Mild19% dilatation with moderate systolic dysfunction (EF 30%) 3. Midwa37%LGE in LV lateral wall, which is a scar pattern from nonischemic etiology such as prior myocarditis 4. RV insertion site LGE, which is a nonspecific scar pattern often seen in setting of elevated pulmonary pressures 5.  Moderate pericardial effusion 6.  Moderate mitral regurgitation (regurgitant fraction 32%) 7. Moderate to severe tricuspid regurgitation (was not quantified) Electronically Signed   By: Christopher  Oswaldo Milian5/08/2021 22:50   ? ? ?Medications:   ? ? ?Scheduled Medications: ? amiodarone  200 mg Oral BID  ?  anastrozole  1 mg Oral QPM  ? apixaban  5 mg Oral BID  ? busPIRone  5 mg Oral BID  ? digoxin  0.125 mg Oral Daily  ? furosemide  80 mg Intravenous BID  ? mexiletine  200 mg Oral Q12H  ? pantoprazole  40 mg Oral Daily  ? potassium chloride  40 mEq Oral BID  ? spironolactone  25 mg Oral Daily  ? traZODone  50 mg Oral QHS  ? ? ?Infusions: ? ? ? ?PRN Medications: ? ? ? ?Patient Profile  ? ?61 y.o. femal with chornic systolic CHF, NICM, PAF/ectopic atrial tach, mitral regurgitation, hyperthyroidism, PVCs, Admitted with a/c systolic CHF. ? ?Assessment/Plan  ? ?1. Chronic systolic HF: NICM, normal cors in 03/2017. 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  ?- Echo (03/2017):  EF 15-20%  ?- TEE (11/20): EF 5% in setting of AF ?- Echo (2/21): EF 30-35% ?- Echo (4/22): 20-25%  ?- RHC (7/22): markedly elevated filling pressures and preserved CO, prominent v-waves ?- Echo (2/23): EF 20%, severe LV dysfunction, grade III DD, RV moderately reduced, moderate MR, TR moderate to severe ?- Admitted with marked volume overload. BNP >1800 ?- RHC- RA 4, PCWP 30 prominent V waves, PA sat 75%, CO 4.7 CI 3.1  ?- CMRI -Severe LV dilation. EF 17%, mod RV dysfunction.  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  ?- Volume status improving but need additional IV diuresis.  Renal function stable.  ?- - No beta blocker with possible low output ?- Intolerant to [place].  ?- Continue digoxin and spiro  ?- GDMT limited due to hypotension.  ?- Have not proceeded with ICD given no insurance and

## 2021-12-22 NOTE — Progress Notes (Signed)
?PROGRESS NOTE ? ? 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 poor medication compliance [?   ?  ?Last EF 09/2021 improved to 20% with grade 3 diastolic dysfunction--- at baseline she has NYHA class IIIb symptoms ? ?Recent outpatient work-up for abdominal discomfort with CT scan chest abdomen pelvis showing congestive hepatopathy HIDA scan negative and came to ED with acute superimposed on chronic systolic heart failure on 4/28 ?Found to have low normal pressures ?CXR showed congestion LFTs elevated BNP 1800 ?Cardiology was consulted and patient was seen by Dr. Jonne Ply ?  ?AHF team seeing as well tday---looks like she is ~ 145 lb as OP--last wght in clinic =141 lb 12/01/21 ?She was diuresed in the ED--is now 136 ?  ?HF added lasix 80 bid ?5/1: Cardiac Cath showing ^^ PCWP, cMRI EF 17% ?5.2 diuresis continues ? ?Hospital-Problem based course ? 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 ?Per cardiology ?On lasix 80 bidiv /hf team--med adjustments deferred to them ?Cath shows elevated PCMP ?Wght 61-->57.4, -6.65 L since admit ?PAF/Ectopic A Tach-prior DC CV ?Prolonged QTc ?Cont Amio 200 bid, eliquis 5 bid/Mexilitene 200 bd ?Hypokalemia ?Replace potassium aggressively kdur 40 bid--mag ok--recheck am labs ?Insomnia-surrogate for anxiety depression ?Unclear if tachycardia causing this ?Added Ambien, on  trazadone 50 hs ?Adding Buspar 5 bid ?Adhesive capsulitis left shoulder ?Add capsaicin--OT to evaluate and teach mobility exercises for the same ?Breast cancer status post mastectomy left side ?Outpatient oncology follow-up-cont Anastrazole 1 qpm ? ?DVT prophylaxis: Eliquis ?Code Status: Full ?Family Communication: Discussed with granddaughter who was present at the bedside ?Disposition:  ?Status is: Inpatient ?Remains inpatient appropriate because: ? ?Needs adequate diuresis as per cardiology until then needs to be hospital ?  ?Consultants:  ?Advanced heart failure team ? ?Procedures: Multiple ? ?Antimicrobials: None ? ? ?Subjective: ? ?Pleasant-walking halls with Cardiac Rehab and looks ok ?Still does have some pain in left shoulder-also complaining now of bilateral knee pain ?No chest pain ?Walked over 300 feet but felt a little bit "tired" ? ? ?Objective: ?Vitals:  ? 12/22/21 0000 12/22/21 0345 12/22/21 0400 12/22/21 0700  ?BP: 104/67 (!) 144/61  (!) 145/71  ?Pulse: 74 72  77  ?Resp: 18   16  ?Temp: 98 ?F (36.7 ?C) 98 ?F (36.7 ?C)    ?TempSrc: Oral Oral    ?SpO2: 96% 98%  100%  ?Weight:   57.4 kg   ?Height:      ? ? ?Intake/Output Summary (Last 24 hours) at 12/22/2021 1417 ?Last data filed at 12/22/2021 0700 ?Gross per 24 hour  ?Intake 838.52 ml  ?Output 1200 ml  ?Net -361.48 ml  ? ? ?Filed Weights  ? 12/20/21 0027 12/21/21 0040 12/22/21 0400  ?Weight: 58.5 kg 57.9 kg 57.4 kg  ? ? ?Examination: ? ?Coherent pleasant ?JVD present, + hepatojug reflux Nplate is decreased from prior ?restricted ROM with Decreased external rotation to left shoulder---cannot place arm behind head on left side appropriately ?Ctab ?Abd soft, some HSM  noted, nontender no rebound  ?neuro is intact no focal deficit ?Discussed with daughter in person at the bedside ? ? ?Data Reviewed: personally reviewed  ? ?CBC ?   ?Component Value Date/Time  ? WBC 5.9 12/22/2021 0438  ? RBC 5.26 (H) 12/22/2021 0438  ? HGB 14.6 12/22/2021 0438  ? HCT 45.6  12/22/2021 0438  ? PLT 380 12/22/2021 0438  ? MCV 86.7 12/22/2021 0438  ? MCH 27.8 12/22/2021 0438  ? MCHC 32.0 12/22/2021 0438  ? RDW 16.4 (H) 12/22/2021 0438  ? LYMPHSABS 0.5 (L) 12/18/2021 0615  ? MONOABS 0.5 12/18/2021 0615  ? EOSABS 0.1 12/18/2021 0615  ? BASOSABS 0.1 12/18/2021 0615  ? ? ?  Latest Ref Rng & Units 12/22/2021  ?  4:38 AM 12/21/2021  ?  3:03 PM 12/21/2021  ?  2:58 PM  ?CMP  ?Glucose 70 - 99 mg/dL 94      ?BUN 8 - 23 mg/dL 19      ?Creatinine 0.44 - 1.00 mg/dL 1.07      ?Sodium 135 - 145 mmol/L 133   136   134    ? 133    ?Potassium 3.5 - 5.1 mmol/L 4.3   4.0   4.1    ? 4.3    ?Chloride 98 - 111 mmol/L 96      ?CO2 22 - 32 mmol/L 24      ?Calcium 8.9 - 10.3 mg/dL 9.5      ?Total Protein 6.5 - 8.1 g/dL 7.1      ?Total Bilirubin 0.3 - 1.2 mg/dL 0.9      ?Alkaline Phos 38 - 126 U/L 155      ?AST 15 - 41 U/L 26      ?ALT 0 - 44 U/L 38      ? ? ? ?Radiology Studies: ?CARDIAC CATHETERIZATION ? ?Result Date: 12/21/2021 ?Findings: RA = 4 RV = 59/7 PA = 62/30 (42) PCW = 30 (v waves to 35-40) Fick cardiac output/index = 4.7/3.1 PVR = 2.6 WU Ao sat = 99% PA sat = 74%, 75% SVC sat = 76% PAPi = 8.0 Assessment: 1. Normal cardiac output with markedly elevated left-sided filling pressures. Plan/Discussion: Continue diuresis. Glori Bickers, MD 7:24 PM ? ?MR CARDIAC MORPHOLOGY W WO CONTRAST ? ?Result Date: 12/21/2021 ?CLINICAL DATA:  89F with NICM, EF 20% on echo EXAM: CARDIAC MRI TECHNIQUE: The patient was scanned on a 1.5 Tesla Siemens magnet. A dedicated cardiac coil was used. Functional imaging was done using Fiesta sequences. 2,3, and 4 chamber views were done to assess for RWMA's. Modified Simpson's rule using a short axis stack was used to calculate an ejection fraction on a dedicated work Conservation officer, nature. The patient received 8 cc of Gadavist. After 10 minutes inversion recovery sequences were used to assess for infiltration and scar tissue. CONTRAST:  8 cc  of Gadavist FINDINGS: Left ventricle:  -Severe dilatation -Severe systolic dysfunction -ECV elevation (38%) -Lateral wall midwall LGE -RV insertion site LGE LV EF:  17% (Normal 56-78%) Absolute volumes: LV EDV: 266m (Normal 52-141 mL) LV ESV: 1749m(Normal 13-51 mL) LV SV: 37102mNormal 33-97 mL) CO: 3.4L/min (Normal 2.7-6.0 L/min) Indexed volumes: LV EDV: 140m59m-m (Normal 41-81 mL/sq-m) LV ESV: 116mL31mm (Normal 12-21 mL/sq-m) LV SV: 24mL/25m (Normal 26-56 mL/sq-m) CI: 2.2L/min/sq-m (Normal 1.8-3.8 L/min/sq-m) Right ventricle: Mild dilatation with moderate systolic dysfunction RV EF: 30% (Normal 47-80%) Absolute volumes: RV EDV: 145mL (10mal 58-154 mL) RV ESV: 101mL (N64ml 12-68 mL) RV SV:  74m (Normal 35-98 mL) CO: 4.0L/min (Normal 2.7-6 L/min) Indexed volumes: RV EDV: 959msq-m (Normal 48-87 mL/sq-m) RV ESV: 6674mq-m (Normal 11-28 mL/sq-m) RV SV: 7m67m-m (Normal 27-57 mL/sq-m) CI: 2.7L/min/sq-m (Normal 1.8-3.8 L/min/sq-m) Left atrium: Severe enlargement Right atrium: Mild enlargement Mitral valve: Moderate regurgitation (regurgitant fraction 32%) Aortic valve: Trivial regurgitation Tricuspid valve: Moderate to severe regurgitation Pulmonic valve: Trivial regurgitation Aorta: Normal proximal ascending aorta Pericardium: Moderate effusion measuring up to 12mm84macent to LV lateral wall IMPRESSION: 1.  Severe LV dilatation with severe systolic dysfunction (EF 17%) 45% Mild RV dilatation with moderate systolic dysfunction (EF 30%) 03%Midwall LGE in LV lateral wall, which is a scar pattern from nonischemic etiology such as prior myocarditis 4. RV insertion site LGE, which is a nonspecific scar pattern often seen in setting of elevated pulmonary pressures 5.  Moderate pericardial effusion 6.  Moderate mitral regurgitation (regurgitant fraction 32%) 7. Moderate to severe tricuspid regurgitation (was not quantified) Electronically Signed   By: ChrisOswaldo Milian   On: 12/21/2021 22:50   ? ? ?Scheduled Meds: ? amiodarone  200 mg Oral BID  ?  anastrozole  1 mg Oral QPM  ? apixaban  5 mg Oral BID  ? busPIRone  5 mg Oral BID  ? capsaicin   Topical BID  ? digoxin  0.125 mg Oral Daily  ? furosemide  80 mg Intravenous BID  ? metolazone  2.5 mg Oral Once

## 2021-12-22 NOTE — Progress Notes (Signed)
Pt had an IV NSL in right forearm near [facility] area, attempted to flush, pt c/o discomfort/pain to area when flushing.  Site removed, dsg applied.  This site not on IV flowsheet, documented here on progress notes for removal of site.  ?

## 2021-12-22 NOTE — Progress Notes (Signed)
CARDIAC REHAB PHASE I  ? ?PRE:  Rate/Rhythm: 84 SR ? ?  BP: sitting 94/69 right arm, leg 120/69 ? ?  SaO2: 100 RA ? ?MODE:  Ambulation: 1410 ft  ? ?POST:  Rate/Rhythm: 104 ST ? ?  BP: sitting 115/70 right leg  ? ?  SaO2: 100 RA ? ?Tolerated well, no significant c/o.  Significant discrepancy in right arm and right calf BP. Long walk, no SOB or afib. Eager to [employer]. Granddaughter present and offers translation. Pt has HF booklet in Spanish but does not read. Granddaughter will review with her. I will f/u for more education. She weighs daily. ?[phone] ? ?[name] CES, ACSM ?12/22/2021 ?3:21 PM ? ? ? ? ?

## 2021-12-22 NOTE — TOC CM/SW Note (Addendum)
HF TOC CM spoke to pt and grand-dtr, [name] at bedside with [place], Spero Geralds. Pt's PCP, Heber Carrizales NP at [facility]. Pt has cane at home if she needs. Pt has scale and weighs daily. Pt meds are covered under HF Funds. Pt does not qualify for MATCH, she is not a [place] citizen. Pt's grand-dtr cares for pt in home and provides transportation. Jonnie Finner RN3 CCM, Heart Failure TOC CM [phone]  ?

## 2021-12-22 NOTE — Plan of Care (Signed)
?  Problem: Health Behavior/Discharge Planning: ?Goal: Ability to manage health-related needs will improve ?Outcome: Progressing ?  ?Problem: Clinical Measurements: ?Goal: Ability to maintain clinical measurements within normal limits will improve ?Outcome: Progressing ?Goal: Will remain free from infection ?Outcome: Progressing ?  ?Problem: Activity: ?Goal: Risk for activity intolerance will decrease ?Outcome: Progressing ?  ?Problem: Coping: ?Goal: Level of anxiety will decrease ?Outcome: Progressing ?  ?Problem: Elimination: ?Goal: Will not experience complications related to urinary retention ?Outcome: Progressing ?  ?

## 2021-12-23 DIAGNOSIS — E0590 Thyrotoxicosis, unspecified without thyrotoxic crisis or storm: Secondary | ICD-10-CM | POA: Diagnosis present

## 2021-12-23 DIAGNOSIS — C539 Malignant neoplasm of cervix uteri, unspecified: Secondary | ICD-10-CM | POA: Diagnosis present

## 2021-12-23 DIAGNOSIS — N179 Acute kidney failure, unspecified: Secondary | ICD-10-CM | POA: Diagnosis present

## 2021-12-23 DIAGNOSIS — I480 Paroxysmal atrial fibrillation: Secondary | ICD-10-CM

## 2021-12-23 DIAGNOSIS — E876 Hypokalemia: Secondary | ICD-10-CM | POA: Diagnosis present

## 2021-12-23 LAB — COMPREHENSIVE METABOLIC PANEL
ALT: 35 U/L (ref 0–44)
AST: 29 U/L (ref 15–41)
Albumin: 4.3 g/dL (ref 3.5–5.0)
Alkaline Phosphatase: 142 U/L — ABNORMAL HIGH (ref 38–126)
Anion gap: 14 (ref 5–15)
BUN: 24 mg/dL — ABNORMAL HIGH (ref 8–23)
CO2: 25 mmol/L (ref 22–32)
Calcium: 9.9 mg/dL (ref 8.9–10.3)
Chloride: 92 mmol/L — ABNORMAL LOW (ref 98–111)
Creatinine, Ser: 1.16 mg/dL — ABNORMAL HIGH (ref 0.44–1.00)
GFR, Estimated: 54 mL/min — ABNORMAL LOW (ref >60)
Glucose, Bld: 95 mg/dL (ref 70–99)
Potassium: 3.9 mmol/L (ref 3.5–5.1)
Sodium: 131 mmol/L — ABNORMAL LOW (ref 135–145)
Total Bilirubin: 0.9 mg/dL (ref 0.3–1.2)
Total Protein: 7.4 g/dL (ref 6.5–8.1)

## 2021-12-23 LAB — CBC
HCT: 45.8 % (ref 36.0–46.0)
Hemoglobin: 14.8 g/dL (ref 12.0–15.0)
MCH: 27.5 pg (ref 26.0–34.0)
MCHC: 32.3 g/dL (ref 30.0–36.0)
MCV: 85 fL (ref 80.0–100.0)
Platelets: 404 10*3/uL — ABNORMAL HIGH (ref 150–400)
RBC: 5.39 MIL/uL — ABNORMAL HIGH (ref 3.87–5.11)
RDW: 16.3 % — ABNORMAL HIGH (ref 11.5–15.5)
WBC: 6.1 10*3/uL (ref 4.0–10.5)
nRBC: 0 % (ref 0.0–0.2)

## 2021-12-23 LAB — PROTIME-INR
INR: 1.6 — ABNORMAL HIGH (ref 0.8–1.2)
Prothrombin Time: 18.7 seconds — ABNORMAL HIGH (ref 11.4–15.2)

## 2021-12-23 LAB — MAGNESIUM
Magnesium: 2.3 mg/dL (ref 1.7–2.4)
Magnesium: 2.3 mg/dL (ref 1.7–2.4)

## 2021-12-23 MED ORDER — MEXILETINE HCL 150 MG PO CAPS
300.0000 mg | ORAL_CAPSULE | Freq: Two times a day (BID) | ORAL | Status: DC
  Administered 2021-12-23 – 2021-12-25 (×4): 300 mg via ORAL
  Filled 2021-12-23 (×5): qty 2

## 2021-12-23 NOTE — Progress Notes (Signed)
CARDIAC REHAB PHASE I  ? ?PRE:  Rate/Rhythm: 82 SR ? ?  BP: sitting 104/66 ? ?  SaO2:  ? ?MODE:  Ambulation: 1410 ft  ? ?POST:  Rate/Rhythm: 104 ST ? ?  BP: sitting 116/68  ? ?  SaO2: 98 RA ? ?Pt weak initially with some staggering. Better with distance. Talked while we walked. Tired after 3 laps. Pt drinking Electrolyte in room, discussed having 430 milligrams of sodium per bottle. Pt sts she won't drink any more. Will continue to educate. She sts she cooks from scratch, does not like canned goods. Sts she does put a little salt in while she cooks. Pt sts her husband died 4 months ago. ?[phone]  ? ?[name] CES, ACSM ?12/23/2021 ?3:29 PM ? ? ? ? ?

## 2021-12-23 NOTE — Evaluation (Signed)
Occupational Therapy Evaluation ?Patient Details ?Name: Megan Keith ?MRN: [id number] ?DOB: December 02, 1960 ?Today's Date: 12/23/2021 ? ? ?History of Present Illness 61 y.o. femal with chornic systolic CHF, NICM, PAF/ectopic atrial tach, mitral regurgitation, hyperthyroidism, PVCs, Admitted with a/c systolic CHF.  ? ?Clinical Impression ?  ? Pt. Speaks limited [place] and needs interpreter. Pt. Was able to follow all directions and is at baseline. Patient is an agreement that ot is not needed. Pt. Has someone available 24/7 to assist with meals and iadls as needed.  ?   ? ?Recommendations for follow up therapy are one component of a multi-disciplinary discharge planning process, led by the attending physician.  Recommendations may be updated based on patient status, additional functional criteria and insurance authorization.  ? ?Follow Up Recommendations ? No OT follow up  ?  ?Assistance Recommended at Discharge None  ?Patient can return home with the following Assistance with cooking/housework ? ?  ?Functional Status Assessment ? Patient has not had a recent decline in their functional status  ?Equipment Recommendations ? None recommended by OT  ?  ?Recommendations for Other Services   ? ? ?  ?Precautions / Restrictions Precautions ?Precautions: Fall ?Restrictions ?Weight Bearing Restrictions: No  ? ?  ? ?Mobility Bed Mobility ?Overal bed mobility: Independent ?  ?  ?  ?  ?  ?  ?  ?  ? ?Transfers ?Overall transfer level: Independent ?  ?  ?  ?  ?  ?  ?  ?  ?  ?  ? ?  ?Balance Overall balance assessment: No apparent balance deficits (not formally assessed) ?  ?  ?  ?  ?  ?  ?  ?  ?  ?  ?  ?  ?  ?  ?  ?  ?  ?  ?   ? ?ADL either performed or assessed with clinical judgement  ? ?ADL Overall ADL's : At baseline ?  ?  ?  ?  ?  ?  ?  ?  ?  ?  ?  ?  ?  ?  ?  ?  ?  ?  ?  ?General ADL Comments: Pt. is able to preform LE dressing and amb in room without assist.  ? ? ? ?Vision Baseline Vision/History: 1 Wears  glasses ?Ability to See in Adequate Light: 0 Adequate ?Patient Visual Report: No change from baseline (has cataracts but not ready for sx yet) ?   ?   ?Perception   ?  ?Praxis   ?  ? ?Pertinent Vitals/Pain Pain Assessment ?Pain Assessment: No/denies pain  ? ? ? ?Hand Dominance Right ?  ?Extremity/Trunk Assessment Upper Extremity Assessment ?Upper Extremity Assessment: Overall WFL for tasks assessed ?  ?Lower Extremity Assessment ?Lower Extremity Assessment: Defer to PT evaluation ?  ?  ?  ?Communication Communication ?Communication: Prefers language other than English ?  ?Cognition Arousal/Alertness: Awake/alert ?Behavior During Therapy: [facility] for tasks assessed/performed ?Overall Cognitive Status: Within Functional Limits for tasks assessed ?  ?  ?  ?  ?  ?  ?  ?  ?  ?  ?  ?  ?  ?  ?  ?  ?  ?  ?  ?General Comments    ? ?  ?Exercises   ?  ?Shoulder Instructions    ? ? ?Home Living Family/patient expects to be discharged to:: Private residence ?Living Arrangements: Other relatives ?Available Help at Discharge: Family;Friend(s);Available 24 hours/day ?Type of Home: Mobile home ?Home Access: Stairs  to enter ?Entrance Stairs-Number of Steps: 3 ?Entrance Stairs-Rails: None ?Home Layout: One level ?  ?  ?Bathroom Shower/Tub: Tub/shower unit ?  ?Bathroom Toilet: Standard ?  ?  ?Home Equipment: Cane - single point;Hand held shower head ?  ?  ?  ? ?  ?Prior Functioning/Environment Prior Level of Function : Independent/Modified Independent ?  ?  ?  ?  ?  ?  ?Mobility Comments: mod I ?ADLs Comments: Pt. was Mod i but required rest breaks. ?  ? ?  ?  ?OT Problem List:   ?  ?   ?OT Treatment/Interventions:    ?  ?OT Goals(Current goals can be found in the care plan section) Acute Rehab OT Goals ?Patient Stated Goal: go home  ?OT Frequency:   ?  ? ?Co-evaluation   ?  ?  ?  ?  ? ?  ?AM-PAC OT "6 Clicks" Daily Activity     ?Outcome Measure Help from another person eating meals?: None ?Help from another person taking care of personal  grooming?: None ?Help from another person toileting, which includes using toliet, bedpan, or urinal?: None ?Help from another person bathing (including washing, rinsing, drying)?: None ?Help from another person to put on and taking off regular upper body clothing?: None ?Help from another person to put on and taking off regular lower body clothing?: None ?6 Click Score: 24 ?  ?End of Session Nurse Communication:  (ok working wtih patient and having her sit in chair without alarm) ? ?Activity Tolerance: Patient tolerated treatment well ?Patient left: in chair;with call bell/phone within reach ? ?   ?              ?Time: 1100-1130 ?OT Time Calculation (min): 30 min ?Charges:  OT General Charges ?$OT Visit: 1 Visit ?OT Evaluation ?$OT Eval Low Complexity: 1 Low ?Reece Packer OT/L ? ? ?Trinadee Verhagen ?12/23/2021, 11:44 AM ?

## 2021-12-23 NOTE — Assessment & Plan Note (Addendum)
Continue with amiodarone and mexilitine.  ?anticoagulation with apixaban, ?

## 2021-12-23 NOTE — Progress Notes (Signed)
?Progress Note ? ? ?Patient: Megan Keith WUG:[id number] DOB: Apr 29, 1961 DOA: 12/17/2021     5 ?DOS: the patient was seen and examined on 12/23/2021 ?  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 ?Last EF 09/2021 improved to 20% with grade 3 diastolic dysfunction--- at baseline she has NYHA class IIIb symptoms ? ?Recent outpatient work-up for abdominal discomfort with CT scan chest abdomen pelvis showing congestive hepatopathy HIDA scan negative and came to ED with acute superimposed on chronic systolic heart failure on 4/28 ?Found to have low normal pressures ?CXR showed congestion LFTs elevated BNP 1800 ?Cardiology was consulted and patient was seen by Dr. Jonne Ply ? ?AHF team seeing as well tday---looks like she is ~ 145 lb as OP--last wght in clinic =141 lb 12/01/21 ?She was diuresed in the ED--is now 136 ?  ?HF added lasix 80 bid ?5/1: Cardiac Cath showing ^^ PCWP, cMRI EF 17% ?5.2 diuresis continues ? ?Assessment and Plan: ?* Acute on chronic systolic CHF (congestive heart failure) ([place]) ?Echocardiogram with reduced LV systolic function EF 88%.  ?Moderate to severe mitral regurgitation.  ?Her volume status has improved, with urine output over last 24 hrs of 1,650 ml. ?Systolic blood pressure 95 to 108 mmHg.  ? ?Continue medical therapy with digoxin and spironolactone. ?No B blocker due to acute heart failure decompensation ?Holding on ARB/ Entresto due to risk of hypotension ?Intolerant to SGLT2 I ? ?Frequent PVC, ectopy. ?Plan to  continue with mexilitine and amiodarone.  ? ?Elevated LFT due to congestive hepatopathy.  ? ? ?Paroxysmal atrial fibrillation (HCC) ?Continue rate control with amiodarone and mexilitine.  ?anticoagulation with apixaban, ? ?Hypokalemia ?Hyponatremia  ?Renal function with serum cr at 1,16 with K at 3,9 and serum bicarbonate at 25. Na 131.  ?Plan to continue close monitoring renal function and electrolytes.  ?Loop diuretics and spironolactone.  ? ? ? ?Hyperthyroidism ?Du to amiodarone ?She has completed therapy.  ?Continue close monitoring of TSH.  ? ?Cervical cancer ([place]) ?Breast cancer. ? ?Stable, follow up as outpatient.  ? ? ? ? ?  ? ?Subjective: Patient feeling dizzy and weak today  ? ?Physical Exam: ?Vitals:  ? 12/22/21 2005 12/23/21 0028 12/23/21 0423 12/23/21 8280  ?BP: 1'13/82 95/69 98/60 '$ 101/68  ?Pulse: 87 87 77   ?Resp:  '17 18 18  '$ ?Temp: 98.1 ?F (36.7 ?C) 97.7 ?F (36.5 ?C) 97.7 ?F (36.5 ?C) 98.5 ?F (36.9 ?C)  ?TempSrc: Oral Oral Oral Oral  ?SpO2: 100% 99% 100%   ?Weight:   57.3 kg   ?Height:      ? ?Neurology awake and alert ?ENT with mild pallor ?Cardiovascular with S1 and S2 present with no gallops.  ?Respiratory with no rales ?Abdomen not distended ?No lower extremity edema  ?Data Reviewed: ? ? ? ?Family Communication: I spoke over the phone with the patient's granddaughter  about patient's  condition, plan of care, prognosis and all questions were addressed.  ? ?Disposition: ?Status is: Inpatient ?Remains inpatient appropriate because: heart failure management  ? Planned Discharge Destination: Home ? ? ? ? ?Author: ?Tawni Millers, MD ?12/23/2021 1:48 PM ? ?For on  call review [email or web address].  ?

## 2021-12-23 NOTE — Progress Notes (Addendum)
? ? Advanced Heart Failure Rounding Note ? ?PCP-Cardiologist: Glori Bickers, MD  ? ?Subjective:   ? ?Patient consented to have daughter translate via phone. ? ?Continues to diurese with IV lasix + metolazone.  ? ?1.6L UOP charted yesterday. Weight unchanged. ? ?Scr stable. ? ?Ambulated > 1400 feet with CR yesterday. ? ?Feeling much better. Denies dyspnea.  ? ? ?Cardiac studies ?[place] 12/21/21  ?RA = 4 ?RV = 59/7 ?PA = 62/30 (42) ?PCW = 30 (v waves to 35-40) ?Fick cardiac output/index = 4.7/3.1 ?PVR = 2.6 WU ?Ao sat = 99% ?PA sat = 74%, 75% ?SVC sat = 76%  ?PAPi = 8.0 ? ?CMRI- Severe LV dilation. EF 17%, mod RV dysfunction.  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  ?  ?  ? ?Objective:   ?Weight Range: ?57.3 kg ?Body mass index is 27.34 kg/m?.  ? ?Vital Signs:   ?Temp:  [97.7 ?F (36.5 ?C)-98.6 ?F (37 ?C)] 98.5 ?F (36.9 ?C) (05/03 4540) ?Pulse Rate:  [77-87] 77 (05/03 0423) ?Resp:  [17-18] 18 (05/03 0716) ?BP: (95-115)/(60-82) 101/68 (05/03 0716) ?SpO2:  [99 %-100 %] 100 % (05/03 0423) ?Weight:  [57.3 kg] 57.3 kg (05/03 0423) ?Last BM Date : 12/21/21 ? ?Weight change: ?Filed Weights  ? 12/21/21 0040 12/22/21 0400 12/23/21 0423  ?Weight: 57.9 kg 57.4 kg 57.3 kg  ? ? ?Intake/Output:  ? ?Intake/Output Summary (Last 24 hours) at 12/23/2021 0918 ?Last data filed at 12/23/2021 0429 ?Gross per 24 hour  ?Intake 240 ml  ?Output 1650 ml  ?Net -1410 ml  ?  ? ? ?Physical Exam  ?General:  No distress. Sitting up on side of bed. ?HEENT: normal ?Neck: supple. no JVD. Carotids 2+ bilat; no bruits.  ?Cor: PMI nondisplaced. Regular rate & rhythm with ectopy. No rubs, gallops or murmurs. ?Lungs: clear ?Abdomen: soft, nontender, nondistended. No hepatosplenomegaly.  ?Extremities: no cyanosis, clubbing, rash, edema ?Neuro: alert & orientedx3, cranial nerves grossly intact. moves all 4 extremities w/o difficulty. Affect pleasant ? ? ? ?Telemetry  ? ?SR 90s, frequent PVCs (counts not logged on  tele but very frequent on personal review) ? ?Labs  ?  ?CBC ?Recent Labs  ?  12/22/21 ?0438 12/23/21 ?0353  ?WBC 5.9 6.1  ?HGB 14.6 14.8  ?HCT 45.6 45.8  ?MCV 86.7 85.0  ?PLT 380 404*  ? ?Basic Metabolic Panel ?Recent Labs  ?  12/22/21 ?0438 12/23/21 ?0353  ?NA 133* 131*  ?K 4.3 3.9  ?CL 96* 92*  ?CO2 24 25  ?GLUCOSE 94 95  ?BUN 19 24*  ?CREATININE 1.07* 1.16*  ?CALCIUM 9.5 9.9  ?MG 2.3 2.3  ? ?Liver Function Tests ?Recent Labs  ?  12/22/21 ?0438 12/23/21 ?0353  ?AST 26 29  ?ALT 38 35  ?ALKPHOS 155* 142*  ?BILITOT 0.9 0.9  ?PROT 7.1 7.4  ?ALBUMIN 4.1 4.3  ? ?No results for input(s): LIPASE, AMYLASE in the last 72 hours. ? ?Cardiac Enzymes ?No results for input(s): CKTOTAL, CKMB, CKMBINDEX, TROPONINI in the last 72 hours. ? ?BNP: ?BNP (last 3 results) ?Recent Labs  ?  12/01/21 ?1526 12/10/21 ?0751 12/17/21 ?2142  ?BNP 1,546.2* 1,571.6* 1,840.6*  ? ? ?ProBNP (last 3 results) ?No results for input(s): PROBNP in the last 8760 hours. ? ? ?D-Dimer ?No results for input(s): DDIMER in the last 72 hours. ?Hemoglobin A1C ?No results for input(s): HGBA1C in the last 72 hours. ?Fasting Lipid Panel ?No results for input(s): CHOL, HDL, LDLCALC, TRIG, CHOLHDL,  LDLDIRECT in the last 72 hours. ?Thyroid Function Tests ?No results for input(s): TSH, T4TOTAL, T3FREE, THYROIDAB in the last 72 hours. ? ?Invalid input(s): FREET3 ? ? ?Other results: ? ? ?Imaging  ? ? ?No results found. ? ? ?Medications:   ? ? ?Scheduled Medications: ? amiodarone  200 mg Oral BID  ? anastrozole  1 mg Oral QPM  ? apixaban  5 mg Oral BID  ? busPIRone  5 mg Oral BID  ? capsaicin   Topical BID  ? digoxin  0.125 mg Oral Daily  ? furosemide  80 mg Intravenous BID  ? mexiletine  200 mg Oral Q12H  ? pantoprazole  40 mg Oral Daily  ? potassium chloride  40 mEq Oral BID  ? spironolactone  25 mg Oral Daily  ? traZODone  50 mg Oral QHS  ? ? ?Infusions: ? ? ? ?PRN Medications: ? ? ? ?Patient Profile  ? ?61 y.o. femal with chornic systolic CHF, NICM, PAF/ectopic atrial  tach, mitral regurgitation, hyperthyroidism, PVCs, Admitted with a/c systolic CHF. ? ?Assessment/Plan  ? ?1. Chronic systolic HF: NICM, normal cors in 03/2017. 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  ?- Echo (03/2017):  EF 15-20%  ?- TEE (11/20): EF 5% in setting of AF ?- Echo (2/21): EF 30-35% ?- Echo (4/22): 20-25%  ?- RHC (7/22): markedly elevated filling pressures and preserved CO, prominent v-waves ?- Echo (2/23): EF 20%, severe LV dysfunction, grade III DD, RV moderately reduced, moderate MR, TR moderate to severe ?- Admitted with marked volume overload. BNP >1800 ?- RHC- RA 4, PCWP 30 prominent V waves, PA sat 75%, CO 4.7 CI 3.1  ?- CMRI -Severe LV dilation. EF 17%, mod RV dysfunction.  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  ?- Volume looks better today. Weight down total 9 lb this admit. Will give 1 more dose IV lasix this afternoon and transition to po Torsemide (on 40 mg lasix daily PTA) tomorrow.  ?- No beta blocker with acute exacerbation ?- Intolerant to [place].  ?- Continue digoxin and spiro  ?- GDMT limited due to hypotension.  ?- Continue CR ?- Have not proceeded with ICD given no insurance and she is not a citizen.  ?- She is not a candidate for advanced therapies given immigration status. Her daughter reports she has applied for a [employer] awaiting final approval. ?  ?2. PAF/ectopic atrial tachycardia ?- S/p previous DC-CV in 7/20. 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   ?- Previously was on amio for PVCs but developed hyperthyroidism, treated w/ methimazole.  ?- 06/2019 recurrent AF w/ RVR. Loaded w/amio. Evaluated by EP. Initial plans were for PVI ablation but cancelled after TEE showed EF 5% and severe LAE. Pt underwent DCCV instead.   ?- Saw Dr. Rayann Heman again in 2/21. Planned for AF ablation but does not have insurance coverage for AF ablation.  ?- Continue medical treatment. No good option. HFSW helping with resources. ?- Maintaining SR. ?-  Continue amiodarone 200 mg twice a day . ' ?- Continue Eliquis 5 bid.  ?  ?3. Moderate to severe mitral regurgitation ?- Prominent v-waves in PCWP tracing on RHC 12/21/21 similar to [place] 2022.  ?- Moderate on echo 2/23. ?  ?4. 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  ?- Per Dr. Jasmine December, no further intervention needed. ?- Follow amio labs. TSH okay ?  ?5. H/o Frequent PVCs ?- Zio patch (8/20) with 5% PVC burden; Zio patch (3  days)  ?-11/21 with 16% PVC burden. ?- Zio (5/22) w/ only occasional PVCs, burden 3.3% on amio + mexilitine. ?- Frequent PVCs visualized on tele, but counts do not appear to be logged ?- Increase mexiletine to 300 mg BID ?- Continue amiodarone 200 mg twice a day.  ?- Not candidate for ablation due to severe MR and LAE ?  ?6. Left Breast Cancer ?- Treated w/ XRT. ?  ?7. Adenocarcinoma in situ of cervix  ?- Followed by [facility]. ?- s/p hysertectomy. ? ?8. Elevated LFTs ?- Recent HIDA negative ?- US abdomen heterogenous echotexture and subtle nodularity of liver, possible early cirrhosis. ? D/t right heart failure ?- Mildly elevated AST/ALT suspect in the setting of hepatic congestion  ?-Resolved.  ? ?9. Hypokalemia ?- Stable.  ?- continue spiro  ? ? ? ?Length of Stay: 5 ? ?FINCH, LINDSAY N, PA-C  ?12/23/2021, 9:18 AM ? ?Advanced Heart Failure Team ?Pager [phone] (M-F; 7a - 5p)  ?Please contact [facility] for night-coverage after hours (5p -7a ) and weekends on [email or web address] ? ? ?Patient seen and examined with the above-signed Advanced Practice Provider and/or Housestaff. I personally reviewed laboratory data, imaging studies and relevant notes. I independently examined the patient and formulated the important aspects of the plan. I have edited the note to reflect any of my changes or salient points. I have personally discussed the plan with the patient and/or family. ? ?Diuresing well. Breathing better but feels lightheaded. No orthopnea or PND.  ? ?General:  Well appearing. No resp  difficulty ?HEENT: normal ?Neck: supple. no JVD. Carotids 2+ bilat; no bruits. No lymphadenopathy or thryomegaly appreciated. ?Cor: PMI nondisplaced. Regular rate & rhythm. No rubs, gallops or murmurs. ?Lungs: clea

## 2021-12-23 NOTE — Assessment & Plan Note (Addendum)
Hyponatremia. Hypokalemia.  ? ?Volume status has improved, at the time of her discharge her serum cr is 0,94, K is 4,5 and serum bicarbonate is 22. Na is 128,  ? ?Plan to continue diuresis with furosemide and follow up renal function and electrolytes as outpatient.  ?

## 2021-12-23 NOTE — Assessment & Plan Note (Addendum)
Breast cancer. ? ?Stable, follow up as outpatient.  ?Continue with nutritional supplementation.  ?

## 2021-12-23 NOTE — Assessment & Plan Note (Signed)
Du to amiodarone ?She has completed therapy.  ?Continue close monitoring of TSH.  ?

## 2021-12-23 NOTE — Assessment & Plan Note (Addendum)
Patient was admitted to the progressive care unit and received IV furosemide for diuresis, negative fluid balance was achieved, -9,304 with significant improvement in her symptoms.  ? ?Echocardiogram with reduced LV systolic function EF 88%, global hypokinesis, mild to moderate enlarged cavity. RV systolic function with moderate reduction. Severe dilatation of left atrium. Moderate mitral valve regurgitation, moderate to severe tricuspid regurgitation.  ? ?05/01 Cardiac catheterization PCWP 30, PA mean 42, cardiac output 4,7 and index 3,1 (Fick).  ? ?At the time of her discharge, on 05/05, clinically euvolemic.  ? ? ?Continue medical therapy with digoxin and spironolactone. ?No B blocker due to acute heart failure decompensation ?Holding on ARB/ Entresto due to risk of hypotension ?Intolerant to SGLT2 I ? ?Frequent PVC, ectopy on mexilitine and amiodarone.  ? ?Elevated LFT due to congestive hepatopathy. Ammonia mild elevated to 39, no hepatic encephalopathy.  ? ?

## 2021-12-24 DIAGNOSIS — N179 Acute kidney failure, unspecified: Secondary | ICD-10-CM

## 2021-12-24 DIAGNOSIS — F32A Depression, unspecified: Secondary | ICD-10-CM

## 2021-12-24 LAB — BASIC METABOLIC PANEL
Anion gap: 14 (ref 5–15)
BUN: 31 mg/dL — ABNORMAL HIGH (ref 8–23)
CO2: 26 mmol/L (ref 22–32)
Calcium: 10 mg/dL (ref 8.9–10.3)
Chloride: 89 mmol/L — ABNORMAL LOW (ref 98–111)
Creatinine, Ser: 1.33 mg/dL — ABNORMAL HIGH (ref 0.44–1.00)
GFR, Estimated: 46 mL/min — ABNORMAL LOW (ref >60)
Glucose, Bld: 124 mg/dL — ABNORMAL HIGH (ref 70–99)
Potassium: 3.6 mmol/L (ref 3.5–5.1)
Sodium: 129 mmol/L — ABNORMAL LOW (ref 135–145)

## 2021-12-24 LAB — MAGNESIUM: Magnesium: 2.4 mg/dL (ref 1.7–2.4)

## 2021-12-24 LAB — DIGOXIN LEVEL: Digoxin Level: 1.1 ng/mL (ref 0.8–2.0)

## 2021-12-24 LAB — AMMONIA: Ammonia: 39 umol/L — ABNORMAL HIGH (ref 9–35)

## 2021-12-24 MED ORDER — FUROSEMIDE 40 MG PO TABS
60.0000 mg | ORAL_TABLET | Freq: Every day | ORAL | Status: DC
  Administered 2021-12-25: 60 mg via ORAL
  Filled 2021-12-24: qty 1

## 2021-12-24 MED ORDER — LORAZEPAM 0.5 MG PO TABS
0.5000 mg | ORAL_TABLET | Freq: Every day | ORAL | Status: DC
  Administered 2021-12-24: 0.5 mg via ORAL
  Filled 2021-12-24: qty 1

## 2021-12-24 MED ORDER — FLUTICASONE PROPIONATE 50 MCG/ACT NA SUSP
2.0000 | Freq: Two times a day (BID) | NASAL | Status: DC
  Administered 2021-12-24 – 2021-12-25 (×2): 2 via NASAL
  Filled 2021-12-24: qty 16

## 2021-12-24 NOTE — Progress Notes (Addendum)
?Progress Note ? ? ?Patient: Megan Keith YSA:[id number] DOB: Jul 12, 1961 DOA: 12/17/2021     6 ?DOS: the patient was seen and examined on 12/24/2021 ?  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 ?Last EF 09/2021 improved to 20% with grade 3 diastolic dysfunction--- at baseline she has NYHA class IIIb symptoms ? ?Recent outpatient work-up for abdominal discomfort with CT scan chest abdomen pelvis showing congestive hepatopathy HIDA scan negative and came to ED with acute superimposed on chronic systolic heart failure on 4/28 ?Found to have low normal pressures ?CXR showed congestion LFTs elevated BNP 1800 ?Cardiology was consulted and patient was seen by Dr. Jonne Ply ? ?AHF team seeing as well tday---looks like she is ~ 145 lb as OP--last wght in clinic =141 lb 12/01/21 ?She was diuresed in the ED--is now 136 ?  ?HF added lasix 80 bid ?5/1: Cardiac Cath showing ^^ PCWP, cMRI EF 17% ?5.2 diuresis continues ? ?Assessment and Plan: ?* Acute on chronic systolic CHF (congestive heart failure) ([place]) ?Echocardiogram with reduced LV systolic function EF 32%, global hypokinesis, mild to moderate enlarged cavity. RV systolic function with moderate reduction. Severe dilatation of left atrium. Moderate mitral valve regurgitation, moderate to severe tricuspid regurgitation.  ? ?Urine output over last 24 hrs of 1,700 ml. ?Systolic blood pressure 355 to 110 mmHg.  ?  ?Continue medical therapy with digoxin and spironolactone. ?No B blocker due to acute  heart failure decompensation ?Holding on ARB/ Entresto due to risk of hypotension ?Intolerant to SGLT2 I ? ?Frequent PVC, ectopy on mexilitine and amiodarone.  ? ?Elevated LFT due to congestive hepatopathy. Patient with asterixis today, will check ammonia level.  ?Out of bed to chair tid with meals.  ? ? ?Paroxysmal atrial fibrillation (HCC) ?On amiodarone and mexilitine.  ?anticoagulation with apixaban, ? ?AKI (acute kidney injury) ([place]) ?Hyponatremia. Hypokalemia.  ?Improved volume status, renal function with serum cr at 1,33 with K at 3,6 and serum bicarbonate at 26. Mg is 2,4  ? ?Continue with spironolactone and resume oral furosemide in am.  ?Add 20 meq Kcl today.  ? ? ? ?Hyperthyroidism ?Du to amiodarone ?She has completed therapy.  ?Continue close monitoring of TSH.  ? ?Cervical cancer ([facility]) ?Breast cancer. ? ?Stable, follow up as outpatient.  ?Consult nutrition.  ? ?Depression ?Continue with buspirone. ?For insomnia she has failed trazodone and ambien. ? ?Will add low dose lorazepam at night.  ? ? ? ? ?  ? ?Subjective: Patient is feeling better, but continue to be very weak and deconditioned, positive tremors in bilateral hands, positive sinus congestion  ? ?Physical Exam: ?Vitals:  ? 12/24/21 0033 12/24/21 0452 12/24/21 0720 12/24/21 1035  ?BP: 115/67 99/70 110/71 109/70  ?Pulse:  78 73 79  ?Resp: '16 16 16 18  '$ ?Temp: 98 ?F (36.7 ?C) 97.7 ?F (36.5 ?C) 98.2 ?F (36.8 ?C) 98.4 ?F (36.9 ?C)  ?TempSrc: Oral Oral Oral Oral  ?SpO2: 100% 100% 98% 100%  ?Weight:  55.4 kg    ?Height:      ? ?Neurology awake and alert, positive tremors bilateral hands resting, with features of asterixis ?ENT with mild  pallor ?Cardiovascular with S1 and S2 present, rhythmic, positive S3 gallop, positive systolic murmur at the apex 3/6. ?No JVD ?No lower extremity edema ?Respiratory with no rales or rhonchi ?Abdomen not distended  ?Data Reviewed: ? ? ? ?Family Communication: no family at the bedside  ? ?Disposition: ?Status is:  Inpatient ?Remains inpatient appropriate because: heart failure  ? Planned Discharge Destination: Home ? ? ? ? ? ?Author: ?Tawni Millers, MD ?12/24/2021 2:45 PM ? ?For on call review [email or web address].  ?

## 2021-12-24 NOTE — Progress Notes (Addendum)
? ? Advanced Heart Failure Rounding Note ? ?PCP-Cardiologist: Glori Bickers, MD  ? ?Subjective:   ? ?Granddaughter, [name], translates via video chat at her request. ? ?1.7L UOP yesterday + 2 unmeasured voids. Weight down another 4 lb.  ? ?Scr 1.07>1.16>1.3 ? ?Dig level 1.1. ? ?Breathing okay. Walked 3 labs in unit yesterday. Feels weak overall and reports abdominal discomfort after morning meds. ? ? ?Cardiac studies ?[place] 12/21/21  ?RA = 4 ?RV = 59/7 ?PA = 62/30 (42) ?PCW = 30 (v waves to 35-40) ?Fick cardiac output/index = 4.7/3.1 ?PVR = 2.6 WU ?Ao sat = 99% ?PA sat = 74%, 75% ?SVC sat = 76%  ?PAPi = 8.0 ? ?CMRI- Severe LV dilation. EF 17%, mod RV dysfunction.  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  ?  ?  ? ?Objective:   ?Weight Range: ?55.4 kg ?Body mass index is 26.44 kg/m?.  ? ?Vital Signs:   ?Temp:  [97.7 ?F (36.5 ?C)-98.4 ?F (36.9 ?C)] 98.4 ?F (36.9 ?C) (05/04 1035) ?Pulse Rate:  [73-82] 79 (05/04 1035) ?Resp:  [16-18] 18 (05/04 1035) ?BP: (99-119)/(57-72) 109/70 (05/04 1035) ?SpO2:  [98 %-100 %] 100 % (05/04 1035) ?Weight:  [55.4 kg] 55.4 kg (05/04 0452) ?Last BM Date : 12/22/21 ? ?Weight change: ?Filed Weights  ? 12/22/21 0400 12/23/21 0423 12/24/21 0452  ?Weight: 57.4 kg 57.3 kg 55.4 kg  ? ? ?Intake/Output:  ? ?Intake/Output Summary (Last 24 hours) at 12/24/2021 1105 ?Last data filed at 12/24/2021 1036 ?Gross per 24 hour  ?Intake 800 ml  ?Output 1800 ml  ?Net -1000 ml  ?  ? ? ?Physical Exam  ?General:  No distress. Siting on edge of bed. ?HEENT: normal ?Neck: supple. no JVD. Carotids 2+ bilat; no bruits.  ?Cor: PMI nondisplaced. Regular rate & rhythm. No rubs, gallops or murmurs. ?Lungs: clear ?Abdomen: soft, nontender, nondistended. No hepatosplenomegaly.  ?Extremities: no cyanosis, clubbing, rash, edema ?Neuro: alert & orientedx3, cranial nerves grossly intact. moves all 4 extremities w/o difficulty. Affect pleasant ? ? ? ? ?Telemetry  ? ?SR 70s-80s,  PVCs suppressed (personally reviewed) ? ?Labs  ?  ?CBC ?Recent Labs  ?  12/22/21 ?0438 12/23/21 ?0353  ?WBC 5.9 6.1  ?HGB 14.6 14.8  ?HCT 45.6 45.8  ?MCV 86.7 85.0  ?PLT 380 404*  ? ?Basic Metabolic Panel ?Recent Labs  ?  12/23/21 ?0353 12/23/21 ?1436 12/24/21 ?0258  ?NA 131*  --  129*  ?K 3.9  --  3.6  ?CL 92*  --  89*  ?CO2 25  --  26  ?GLUCOSE 95  --  124*  ?BUN 24*  --  31*  ?CREATININE 1.16*  --  1.33*  ?CALCIUM 9.9  --  10.0  ?MG 2.3 2.3 2.4  ? ?Liver Function Tests ?Recent Labs  ?  12/22/21 ?0438 12/23/21 ?0353  ?AST 26 29  ?ALT 38 35  ?ALKPHOS 155* 142*  ?BILITOT 0.9 0.9  ?PROT 7.1 7.4  ?ALBUMIN 4.1 4.3  ? ?No results for input(s): LIPASE, AMYLASE in the last 72 hours. ? ?Cardiac Enzymes ?No results for input(s): CKTOTAL, CKMB, CKMBINDEX, TROPONINI in the last 72 hours. ? ?BNP: ?BNP (last 3 results) ?Recent Labs  ?  12/01/21 ?1526 12/10/21 ?0751 12/17/21 ?2142  ?BNP 1,546.2* 1,571.6* 1,840.6*  ? ? ?ProBNP (last 3 results) ?No results for input(s): PROBNP in the last 8760 hours. ? ? ?D-Dimer ?No results for input(s): DDIMER in the last 72 hours. ?Hemoglobin A1C ?  No results for input(s): HGBA1C in the last 72 hours. ?Fasting Lipid Panel ?No results for input(s): CHOL, HDL, LDLCALC, TRIG, CHOLHDL, LDLDIRECT in the last 72 hours. ?Thyroid Function Tests ?No results for input(s): TSH, T4TOTAL, T3FREE, THYROIDAB in the last 72 hours. ? ?Invalid input(s): FREET3 ? ? ?Other results: ? ? ?Imaging  ? ? ?No results found. ? ? ?Medications:   ? ? ?Scheduled Medications: ? amiodarone  200 mg Oral BID  ? anastrozole  1 mg Oral QPM  ? apixaban  5 mg Oral BID  ? busPIRone  5 mg Oral BID  ? capsaicin   Topical BID  ? digoxin  0.125 mg Oral Daily  ? mexiletine  300 mg Oral Q12H  ? pantoprazole  40 mg Oral Daily  ? potassium chloride  40 mEq Oral BID  ? spironolactone  25 mg Oral Daily  ? traZODone  50 mg Oral QHS  ? ? ?Infusions: ? ? ? ?PRN Medications: ? ? ? ?Patient Profile  ? ?61 y.o. femal with chornic systolic CHF, NICM,  PAF/ectopic atrial tach, mitral regurgitation, hyperthyroidism, PVCs, Admitted with a/c systolic CHF. ? ?Assessment/Plan  ? ?1. Chronic systolic HF: NICM, normal cors in 03/2017. 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  ?- Echo (03/2017):  EF 15-20%  ?- TEE (11/20): EF 5% in setting of AF ?- Echo (2/21): EF 30-35% ?- Echo (4/22): 20-25%  ?- RHC (7/22): markedly elevated filling pressures and preserved CO, prominent v-waves ?- Echo (2/23): EF 20%, severe LV dysfunction, grade III DD, RV moderately reduced, moderate MR, TR moderate to severe ?- Admitted with marked volume overload. BNP >1800 ?- RHC- RA 4, PCWP 30 prominent V waves, PA sat 75%, CO 4.7 CI 3.1  ?- CMRI -Severe LV dilation. EF 17%, mod RV dysfunction.  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  ?- Volume looks good today. Off IV diuretics. Scr up slightly and BUN trending up.  ?- Start po Furosemide 60 mg daily tomorrow (on 40 mg furosemide daily at home). Does not tolerate Torsemide. ?- No beta blocker with acute exacerbation ?- Intolerant to [place].  ?- Stop digoxin d/t elevated level.  ?- Continue spiro ?- GDMT limited due to hypotension. BP better today. Would ideally add ARB or ARNi but feeling sick to stomach after am meds. ? If dig contributing, stopped as above. Recently increased mexiletine which may cause GI upset. ?- Continue CR ?- Have not proceeded with ICD given no insurance and she is not a citizen.  ?- She is not a candidate for advanced therapies given immigration status. Her granddaughter reports she has applied for a [employer]. May take another couple of years before approved. ?  ?2. PAF/ectopic atrial tachycardia ?- S/p previous DC-CV in 7/20. 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   ?- Previously was on amio for PVCs but developed hyperthyroidism, treated w/ methimazole.  ?- 06/2019 recurrent AF w/ RVR. Loaded w/amio. Evaluated by EP. Initial plans were for PVI ablation but cancelled after  TEE showed EF 5% and severe LAE. Pt underwent DCCV instead.   ?- Saw Dr. Rayann Heman again in 2/21. Planned for AF ablation but does not have insurance coverage for AF ablation.  ?- Continue medical treatment. No good option. HFSW helping with resources. ?- Maintaining SR. ?- Continue amiodarone 200 mg twice a day . ' ?- Continue Eliquis 5 bid.  ?  ?3. Moderate to severe mitral regurgitation ?- Prominent v-waves in PCWP tracing on  [place] 12/21/21 similar to [place] 2022.  ?- Moderate on echo 2/23. ?  ?4. 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  ?- Per Dr. Jasmine December, no further intervention needed. ?- Follow amio labs. TSH okay ?  ?5. H/o Frequent PVCs ?- Zio patch (8/20) with 5% PVC burden; Zio patch (3 days)  ?-11/21 with 16% PVC burden. ?- Zio (5/22) w/ only occasional PVCs, burden 3.3% on amio + mexilitine. ?- Mexiletine increased 05/03 d/t frequent PVCs. Better suppressed today. ?- Continue mexiletine 300 BID ?- Continue amiodarone 200 mg twice a day.  ?- Not candidate for ablation due to severe MR and LAE ?  ?6. Left Breast Cancer ?- Treated w/ XRT. ?  ?7. Adenocarcinoma in situ of cervix  ?- Followed by [facility]. ?- s/p hysertectomy. ? ?8. Elevated LFTs ?- Recent HIDA negative ?- US abdomen heterogenous echotexture and subtle nodularity of liver, possible early cirrhosis. ? D/t right heart failure ?- Mildly elevated AST/ALT suspect in the setting of hepatic congestion  ?-Resolved.  ? ?9. Hypokalemia ?- K 3.6, mag 2.4 ?- Supp ?- continue spiro  ? ?10. GI upset ?- Digoxin stopped d/t elevated level ?- See discussion above regarding meds ?- Doubt d/t low-output, had preserved CO on RHC and now appears near euvolemic. ? ?Continue to mobilize.  ? ?May be ready for discharge home tomorrow.  ? ?Will need meds via HF fund. ? ? ?Length of Stay: 6 ? ?FINCH, LINDSAY N, PA-C  ?12/24/2021, 11:05 AM ? ?Advanced Heart Failure Team ?Pager [phone] (M-F; 7a - 5p)  ?Please contact [facility] for night-coverage after hours (5p -7a )  and weekends on [email or web address] ? ?Denies SOB, orthopnea or PND. Having some abdominal symptoms and visual issues ? ?General:  Well appearing. No resp difficulty ?HEENT: normal ?Neck: supple. no JVD. Carotids 2

## 2021-12-24 NOTE — Plan of Care (Signed)
?  Problem: Health Behavior/Discharge Planning: ?Goal: Ability to manage health-related needs will improve ?Outcome: Progressing ?  ?Problem: Clinical Measurements: ?Goal: Diagnostic test results will improve ?Outcome: Progressing ?  ?Problem: Activity: ?Goal: Risk for activity intolerance will decrease ?Outcome: Progressing ?  ?Problem: Safety: ?Goal: Ability to remain free from injury will improve ?Outcome: Progressing ?  ?

## 2021-12-24 NOTE — Assessment & Plan Note (Addendum)
Continue with buspirone. ? ?

## 2021-12-25 ENCOUNTER — Other Ambulatory Visit (HOSPITAL_COMMUNITY): Payer: Self-pay

## 2021-12-25 DIAGNOSIS — F32A Depression, unspecified: Secondary | ICD-10-CM

## 2021-12-25 LAB — BASIC METABOLIC PANEL
Anion gap: 14 (ref 5–15)
BUN: 25 mg/dL — ABNORMAL HIGH (ref 8–23)
CO2: 22 mmol/L (ref 22–32)
Calcium: 9.4 mg/dL (ref 8.9–10.3)
Chloride: 92 mmol/L — ABNORMAL LOW (ref 98–111)
Creatinine, Ser: 0.94 mg/dL (ref 0.44–1.00)
GFR, Estimated: >60 mL/min (ref >60)
Glucose, Bld: 95 mg/dL (ref 70–99)
Potassium: 4.5 mmol/L (ref 3.5–5.1)
Sodium: 128 mmol/L — ABNORMAL LOW (ref 135–145)

## 2021-12-25 MED ORDER — MEXILETINE HCL 150 MG PO CAPS
300.0000 mg | ORAL_CAPSULE | Freq: Two times a day (BID) | ORAL | 0 refills | Status: DC
  Filled 2021-12-25: qty 120, 30d supply, fill #0

## 2021-12-25 MED ORDER — ENSURE ENLIVE PO LIQD: 237.0000 mL | Freq: Every day | ORAL | 0 refills | Status: DC

## 2021-12-25 MED ORDER — SPIRONOLACTONE 25 MG PO TABS
25.0000 mg | ORAL_TABLET | Freq: Every day | ORAL | 0 refills | Status: DC
Start: 2021-12-26 — End: 2022-02-11
  Filled 2021-12-25: qty 30, 30d supply, fill #0

## 2021-12-25 MED ORDER — FUROSEMIDE 20 MG PO TABS
60.0000 mg | ORAL_TABLET | Freq: Every day | ORAL | 0 refills | Status: DC
  Filled 2021-12-25: qty 90, 30d supply, fill #0

## 2021-12-25 MED ORDER — ENSURE ENLIVE PO LIQD: 237.0000 mL | Freq: Every day | ORAL | Status: DC

## 2021-12-25 NOTE — Progress Notes (Addendum)
? ? Advanced Heart Failure Rounding Note ? ?PCP-Cardiologist: Glori Bickers, MD  ? ?Subjective:   ? ?Granddaughter, [name], translates via video chat at her request. ? ? ?Feels better today. Denies SOB. Denies abdominal pain.  ?  ? ?Objective:   ?Weight Range: ?57.7 kg ?Body mass index is 27.55 kg/m?.  ? ?Vital Signs:   ?Temp:  [97.5 ?F (36.4 ?C)-98.4 ?F (36.9 ?C)] 97.6 ?F (36.4 ?C) (05/05 8295) ?Pulse Rate:  [70-85] 70 (05/05 0722) ?Resp:  [16-19] 17 (05/05 6213) ?BP: (100-116)/(64-77) 100/66 (05/05 0865) ?SpO2:  [100 %] 100 % (05/05 0722) ?Weight:  [57.7 kg] 57.7 kg (05/05 0335) ?Last BM Date : 12/24/21 ? ?Weight change: ?Filed Weights  ? 12/23/21 0423 12/24/21 0452 12/25/21 0335  ?Weight: 57.3 kg 55.4 kg 57.7 kg  ? ? ?Intake/Output:  ? ?Intake/Output Summary (Last 24 hours) at 12/25/2021 1032 ?Last data filed at 12/25/2021 0900 ?Gross per 24 hour  ?Intake 917 ml  ?Output 1200 ml  ?Net -283 ml  ?  ? ? ?Physical Exam  ?General:  Well appearing. No resp difficulty ?HEENT: normal ?Neck: supple. no JVD. Carotids 2+ bilat; no bruits. No lymphadenopathy or thryomegaly appreciated. ?Cor: PMI nondisplaced. Regular rate & rhythm. No rubs, gallops or murmurs. ?Lungs: clear ?Abdomen: soft, nontender, nondistended. No hepatosplenomegaly. No bruits or masses. Good bowel sounds. ?Extremities: no cyanosis, clubbing, rash, edema ?Neuro: alert & orientedx3, cranial nerves grossly intact. moves all 4 extremities w/o difficulty. Affect pleasant ? ? ? ?Telemetry  ? ?SR 70-80s with occasional PVCs.  ?Labs  ?  ?CBC ?Recent Labs  ?  12/23/21 ?0353  ?WBC 6.1  ?HGB 14.8  ?HCT 45.8  ?MCV 85.0  ?PLT 404*  ? ?Basic Metabolic Panel ?Recent Labs  ?  12/23/21 ?1436 12/24/21 ?0258 12/25/21 ?0308  ?NA  --  129* 128*  ?K  --  3.6 4.5  ?CL  --  89* 92*  ?CO2  --  26 22  ?GLUCOSE  --  124* 95  ?BUN  --  31* 25*  ?CREATININE  --  1.33* 0.94  ?CALCIUM  --  10.0 9.4  ?MG 2.3 2.4  --   ? ?Liver Function Tests ?Recent Labs  ?  12/23/21 ?0353  ?AST 29   ?ALT 35  ?ALKPHOS 142*  ?BILITOT 0.9  ?PROT 7.4  ?ALBUMIN 4.3  ? ?No results for input(s): LIPASE, AMYLASE in the last 72 hours. ? ?Cardiac Enzymes ?No results for input(s): CKTOTAL, CKMB, CKMBINDEX, TROPONINI in the last 72 hours. ? ?BNP: ?BNP (last 3 results) ?Recent Labs  ?  12/01/21 ?1526 12/10/21 ?0751 12/17/21 ?2142  ?BNP 1,546.2* 1,571.6* 1,840.6*  ? ? ?ProBNP (last 3 results) ?No results for input(s): PROBNP in the last 8760 hours. ? ? ?D-Dimer ?No results for input(s): DDIMER in the last 72 hours. ?Hemoglobin A1C ?No results for input(s): HGBA1C in the last 72 hours. ?Fasting Lipid Panel ?No results for input(s): CHOL, HDL, LDLCALC, TRIG, CHOLHDL, LDLDIRECT in the last 72 hours. ?Thyroid Function Tests ?No results for input(s): TSH, T4TOTAL, T3FREE, THYROIDAB in the last 72 hours. ? ?Invalid input(s): FREET3 ? ? ?Other results: ? ? ?Imaging  ? ? ?No results found. ? ? ?Medications:   ? ? ?Scheduled Medications: ? amiodarone  200 mg Oral BID  ? anastrozole  1 mg Oral QPM  ? apixaban  5 mg Oral BID  ? busPIRone  5 mg Oral BID  ? capsaicin   Topical BID  ? fluticasone  2 spray Each Nare BID  ?  furosemide  60 mg Oral Daily  ? LORazepam  0.5 mg Oral QHS  ? mexiletine  300 mg Oral Q12H  ? pantoprazole  40 mg Oral Daily  ? spironolactone  25 mg Oral Daily  ? ? ?Infusions: ? ? ? ?PRN Medications: ? ? ? ?Patient Profile  ? ?61 y.o. femal with chornic systolic CHF, NICM, PAF/ectopic atrial tach, mitral regurgitation, hyperthyroidism, PVCs, Admitted with a/c systolic CHF. ? ?Assessment/Plan  ? ?1. Chronic systolic HF: NICM, normal cors in 03/2017. 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  ?- Echo (03/2017):  EF 15-20%  ?- TEE (11/20): EF 5% in setting of AF ?- Echo (2/21): EF 30-35% ?- Echo (4/22): 20-25%  ?- RHC (7/22): markedly elevated filling pressures and preserved CO, prominent v-waves ?- Echo (2/23): EF 20%, severe LV dysfunction, grade III DD, RV moderately reduced, moderate MR, TR moderate to severe ?- Admitted with  marked volume overload. BNP >1800 ?- RHC- RA 4, PCWP 30 prominent V waves, PA sat 75%, CO 4.7 CI 3.1  ?- CMRI -Severe LV dilation. EF 17%, mod RV dysfunction.  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  ?- Appears euvolemic. Start  po Furosemide 60 mg daily today. Does not tolerate Torsemide. ?- No beta blocker with acute exacerbation ?- Intolerant to [place].  ?- Stop digoxin d/t elevated level.  ?- Continue spiro ?- GDMT limited due to hypotension. Would ideally add ARB or ARNi but feeling sick to stomach after am meds.  Recently increased mexiletine which may cause GI upset. ?- Continue CR ?- Have not proceeded with ICD given no insurance and she is not a citizen.  ?- She is not a candidate for advanced therapies given immigration status. Her granddaughter reports she has applied for a [employer]. May take another couple of years before approved. ?  ?2. PAF/ectopic atrial tachycardia ?- S/p previous DC-CV in 7/20. 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   ?- Previously was on amio for PVCs but developed hyperthyroidism, treated w/ methimazole.  ?- 06/2019 recurrent AF w/ RVR. Loaded w/amio. Evaluated by EP. Initial plans were for PVI ablation but cancelled after TEE showed EF 5% and severe LAE. Pt underwent DCCV instead.   ?- Saw Dr. Rayann Heman again in 2/21. Planned for AF ablation but does not have insurance coverage for AF ablation.  ?- Continue medical treatment. No good option. HFSW helping with resources. ?- Maintaining SR. ?- Continue amiodarone 200 mg twice a day . ' ?- Continue Eliquis 5 bid.  ?  ?3. Moderate to severe mitral regurgitation ?- Prominent v-waves in PCWP tracing on [facility] 12/21/21 similar to [place] 2022.  ?- Moderate on echo 2/23. ?  ?4. 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  ?- Per Dr. Jasmine December, no further intervention needed. ?- Follow amio labs. TSH okay ?  ?5. H/o Frequent PVCs ?- Zio patch (8/20) with 5% PVC burden; Zio patch (3  days)  ?-11/21 with 16% PVC burden. ?- Zio (5/22) w/ only occasional PVCs, burden 3.3% on amio + mexilitine. ?- Mexiletine increased 05/03 d/t frequent PVCs. Better suppressed today. ?- Continue mexiletine 300 BID ?- Continue amiodarone 200 mg twice a day.  ?- Not candidate for ablation due to severe MR and LAE ?  ?6. Left Breast Cancer ?- Treated w/ XRT. ?  ?7. Adenocarcinoma in situ of cervix  ?- Followed by [facility]. ?- s/p hysertectomy. ? ?8. Elevated LFTs ?- Recent HIDA negative ?- US abdomen heterogenous  echotexture and subtle nodularity of liver, possible early cirrhosis. ? D/t right heart failure ?- Mildly elevated AST/ALT suspect in the setting of hepatic congestion  ?-Resolved.  ? ?9. Hypokalemia ?K stable.  ? ? ?10. GI upset ?- Digoxin stopped d/t elevated level ?- See discussion above regarding meds ?- Doubt d/t low-output, had preserved CO on RHC ?- Resolved.  ? ? ?OK for discharge to home. 12/31/21 at 9:30  ? ?Lasix 60 mg daily  ?Amiodarone 200 mg twice a day  ?Mexiletine 300 mg twice a day  ?Spironolactone 25 mg daily ?Eliquis 5 mg twice a day  ? ? ?Length of Stay: 7 ? ?Darrick Grinder, NP  ?12/25/2021, 10:32 AM ? ?Advanced Heart Failure Team ?Pager [phone] (M-F; 7a - 5p)  ?Please contact [facility] for night-coverage after hours (5p -7a ) and weekends on [email or web address] ? ?Patient seen and examined with the above-signed Advanced Practice Provider and/or Housestaff. I personally reviewed laboratory data, imaging studies and relevant notes. I independently examined the patient and formulated the important aspects of the plan. I have edited the note to reflect any of my changes or salient points. I have personally discussed the plan with the patient and/or family. ? ?Denies SOB, orthopnea or PND. Ab pain better ? ?General:  Well appearing. No resp difficulty ?HEENT: normal ?Neck: supple. no JVD. Carotids 2+ bilat; no bruits. No lymphadenopathy or thryomegaly appreciated. ?Cor: PMI nondisplaced. Regular rate & rhythm.  No rubs, gallops or murmurs. ?Lungs: clear ?Abdomen: soft, nontender, nondistended. No hepatosplenomegaly. No bruits or masses. Good bowel sounds. ?Extremities: no cyanosis, clubbing, rash, edema ?Neuro: alert

## 2021-12-25 NOTE — TOC Initial Note (Signed)
Transition of Care (TOC) - Initial/Assessment Note  ? ? ?Patient Details  ?Name: Megan Keith ?MRN: [id number] ?Date of Birth: 07-19-1961 ? ?Transition of Care [facility]) CM/SW Contact:    ?Marcheta Grammes Rexene Alberts, RN ?Phone Number: [phone] ?12/25/2021, 1:21 PM ? ?Clinical Narrative:                 ?Scheduled dc home today. Pt has appt to see PCP, Dr Michelle Piper. Pt meds up from TOC. Pt has applied for [employer]. Meds covered under HF fund. Unable to utilize Match, pt is  not a [place] citizen.  ? ?Expected Discharge Plan: Home/Self Care ?Barriers to Discharge: No Barriers Identified ? ? ?Patient Goals and CMS Choice ?Patient states their goals for this hospitalization and ongoing recovery are:: wants to be able to care for herself ?  ?  ? ?Expected Discharge Plan and Services ?Expected Discharge Plan: Home/Self Care ?  ?Discharge Planning Services: CM Consult ?  ?Living arrangements for the past 2 months: Apartment ?Expected Discharge Date: 12/25/21               ?  ?  ?  ?  ?  ?  ?  ?  ?  ?  ? ?Prior Living Arrangements/Services ?Living arrangements for the past 2 months: Apartment ?Lives with:: Adult Children ?Patient language and need for interpreter reviewed:: No ?       ?Need for Family Participation in Patient Care: Yes (Comment) ?Care giver support system in place?: Yes (comment) ?Current home services: DME (cane) ?Criminal Activity/Legal Involvement Pertinent to Current Situation/Hospitalization: No - Comment as needed ? ?Activities of Daily Living ?  ?  ? ?Permission Sought/Granted ?Permission sought to share information with : Case Manager, Family Supports, PCP ?Permission granted to share information with : Yes, Verbal Permission Granted ? Share Information with NAME: [name] Sosa ?   ? Permission granted to share info w Relationship: wife ? Permission granted to share info w Contact Information: [phone] ? ?Emotional Assessment ?Appearance:: Appears stated age ?Attitude/Demeanor/Rapport: Gracious ?Affect  (typically observed): Accepting ?Orientation: : Oriented to Self, Oriented to Place, Oriented to  Time, Oriented to Situation ?  ?Psych Involvement: No (comment) ? ?Admission diagnosis:  Shortness of breath [R06.02] ?Epigastric pain [R10.13] ?Acute CHF (congestive heart failure) (HCC) [I50.9] ?Acute on chronic systolic CHF (congestive heart failure) ([place]) [I50.23] ?Hypervolemia, unspecified hypervolemia type [E87.70] ?Chest pain, unspecified type [R07.9] ?Patient Active Problem List  ? Diagnosis Date Noted  ? Depression 12/24/2021  ? AKI (acute kidney injury) ([place]) 12/23/2021  ? Hyperthyroidism 12/23/2021  ? Cervical cancer ([place]) 12/23/2021  ? Acute on chronic systolic CHF (congestive heart failure) ([place]) 12/18/2021  ? Vaginal itching 08/03/2021  ? Osteoporosis 08/07/2020  ? Paroxysmal atrial fibrillation ([place]) 08/06/2019  ? COVID-19 06/06/2019  ? Chronic systolic CHF (congestive heart failure) ([place])   ? Atrial fibrillation with RVR ([place]) 03/05/2019  ? Hypotension 03/05/2019  ? 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 GERD (gastroesophageal reflux disease) 02/05/2018  ? Breast cancer ([place]) 02/05/2018  ? Headache 02/05/2018  ? Nausea & vomiting 02/05/2018  ? Epigastric abdominal pain 02/05/2018  ? Sore throat 02/05/2018  ? Frequent PVCs   ? DCM (dilated cardiomyopathy) ([place])   ? Acute on chronic HFrEF (heart failure with reduced ejection fraction) ([place]) 04/15/2017  ? 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 ?PCP:  Earna Coder, NP ?Pharmacy:   ?[facility]  [address] ?[place] [address] ?Phone: [phone] Fax: [phone] ? ?[id number] [address] ?[place] [address] ?Phone: [phone] Fax: [phone] ? ?[facility], [place]  [address] ?Phone: [phone] Fax: [phone] ? ?[facility] ?[address] ?[place] [address] ?Phone: [phone] Fax: [phone] ? ? ? ? ?Social Determinants of Health (SDOH) Interventions ?  ? ?Readmission Risk Interventions ? ?  07/10/2019  ?  2:17 PM  ?Readmission Risk Prevention Plan  ?Transportation Screening Complete  ?PCP or Specialist Appt within 5-7 Days Complete  ?Home Care Screening Complete  ?Medication Review (RN CM) Complete  ? ? ? ?

## 2021-12-25 NOTE — Discharge Summary (Addendum)
Physician Discharge Summary   Patient: Megan Keith MRN: [id number] DOB: December 12, 1960  Admit date:     12/17/2021  Discharge date: 12/25/21  Discharge Physician: York Ram Edword Cu   PCP: Joice Lofts, NP   Recommendations at discharge:    Patient will follow up as outpatient in 7 to 10 days with primary care and with cardiology as scheduled Follow up renal function in 7 days as outpatient.  Increased furosemide to 60 mg daily, added spironolactone and resumed on mexiletine.  No b blocker due to sever reduction in systolic function.   Discharge Diagnoses: Principal Problem:   Acute on chronic systolic CHF (congestive heart failure) (HCC) Active Problems:   Paroxysmal atrial fibrillation (HCC)   AKI (acute kidney injury) (HCC)   Hyperthyroidism   Cervical cancer (HCC)   Depression  Resolved Problems:   * No resolved hospital problems. [facility] Course: [name] was admitted to the hospital with the working diagnosis of decompensated heart failure.   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 Prolonged QTc status post DCCV last admitted by cardiology 11/10 through 07/10/2019 Prior history left-sided breast cancer Rx mastectomy XRT, COVID infection 05/2019, poor medication compliance  Last EF 09/2021 improved to 20% with grade 3 diastolic dysfunction--- at baseline she has NYHA class IIIb symptoms  Recent outpatient work-up for abdominal discomfort with CT scan chest abdomen pelvis showing congestive hepatopathy HIDA scan negative and came to ED with acute superimposed on chronic systolic heart failure on 4/28  On the day of hospitalization she was evaluated by GI and she was referred to the ED for  volume overload. She reported worsening dyspnea at home and 10 lbs weight gain over last 10 days. On her initial physical examination her blood pressure was 109/83, HR 96, RR 23 and 02 saturation 94%, lungs with no wheezing or rhonchi, heart with S1 and S2 present with no gallops, positive JVD, abdomen not distended and no lower extremity edema.   Na 137, K 3,9, Cl 104, bicarbonate 21, glucose 119, bun 13 cr 0,93  AST 109 ALT 120  BNP 1,840 Wbc 6,4, hgb 115. Plt 246  Sars covid 19 negative  UA SG 1,008   Chest radiograph with cardiomegaly, bilateral hilar vascular congestion, no infiltrates.  EKG 103 bpm, left axis deviation, qtc 503, sinus rhythm, with no significant ST segment or T wave changes.   Patient was placed on aggressive diuresis with IV furosemide.   5/1: Cardiac Cath showing ^^ PCWP, cMRI EF 17%  Volume status has improved, patient will follow up as outpatient.  Patient with no insurance has limited access to further aggressive interventions.    Assessment and Plan: * Acute on chronic systolic CHF (congestive heart failure) (HCC) Patient was admitted to the progressive care unit and received IV furosemide for diuresis, negative fluid balance was achieved, -9,304 with significant improvement in her symptoms.   Echocardiogram with reduced LV systolic function EF 20%, global hypokinesis, mild to moderate enlarged cavity. RV systolic function with moderate reduction. Severe dilatation of left atrium. Moderate mitral valve regurgitation, moderate to severe tricuspid regurgitation.   05/01 Cardiac catheterization PCWP 30, PA mean 42, cardiac output 4,7 and index 3,1 (Fick).   At the time of her discharge, on 05/05, clinically euvolemic.  Continue medical therapy with digoxin and spironolactone. No B blocker due to acute heart failure decompensation Holding on ARB/ Entresto due to risk of hypotension Intolerant to SGLT2 I  Frequent PVC, ectopy on mexilitine and  amiodarone.   Elevated LFT due to congestive hepatopathy. Ammonia mild elevated to 39, no hepatic encephalopathy.    Paroxysmal atrial fibrillation (HCC) Continue with amiodarone and mexilitine.  anticoagulation with apixaban,  AKI (acute kidney injury) (HCC) Hyponatremia. Hypokalemia.   Volume status has improved, at the time of her discharge her serum cr is 0,94, K is 4,5 and serum bicarbonate is 22. Na is 128,   Plan to continue diuresis with furosemide and follow up renal function and electrolytes as outpatient.   Hyperthyroidism Du to amiodarone She has completed therapy.  Continue close monitoring of TSH.   Cervical cancer (HCC) Breast cancer.  Stable, follow up as outpatient.  Continue with nutritional supplementation.   Depression Continue with buspirone.          Consultants: cardiology  Procedures performed: cardiac catheterization right side   Disposition: Home Diet recommendation:  Discharge Diet Orders (From admission, onward)     Start     Ordered   12/25/21 0000  Diet - low sodium heart healthy        12/25/21 1205           Cardiac diet DISCHARGE MEDICATION: Allergies as of 12/25/2021   No Known Allergies      Medication List     STOP taking these medications    metoprolol succinate 25 MG 24 hr tablet Commonly known as: TOPROL-XL       TAKE these medications    acetaminophen 500 MG tablet Commonly known as: TYLENOL Take 500 mg by mouth every 6 (six) hours as needed for moderate pain or headache.   amiodarone 200 MG tablet Commonly known as: PACERONE Take 200 mg by mouth 2 (two) times daily.   anastrozole 1 MG tablet Commonly known as: ARIMIDEX Take 1 tablet (1 mg total) by mouth every evening.   apixaban 5 MG Tabs tablet Commonly known as: ELIQUIS Take 1 tablet (5 mg total) by mouth 2 (two) times daily.   feeding supplement Liqd Take 237 mLs by mouth daily in the afternoon. Start taking on: Dec 26, 2021    furosemide 20 MG tablet Commonly known as: LASIX Take 3 tablets (60 mg total) by mouth daily. Start taking on: Dec 26, 2021 What changed:  medication strength how much to take   mexiletine 150 MG capsule Commonly known as: MEXITIL Take 2 capsules (300 mg total) by mouth every 12 (twelve) hours. What changed:  medication strength how much to take when to take this   omeprazole 20 MG capsule Commonly known as: PRILOSEC Take 20 mg by mouth daily.   spironolactone 25 MG tablet Commonly known as: ALDACTONE Tome 1 tableta (25 mg en total) por va oral diariamente. (Take 1 tablet (25 mg total) by mouth daily.) Start taking on: Dec 26, 2021        Follow-up Information     Meda Coffee, FNP. Go on 12/31/2021.   Specialty: Family Medicine Why: @10 :30am Contact information: [address] [place] [address] [phone]         [facility] Follow up on 12/31/2021.   Specialty: Cardiology Why: at 9:30 Bring all medicaitons Contact information: [address] [id number] mc [place] [address] [phone]  Discharge Exam: Filed Weights   12/23/21 0423 12/24/21 0452 12/25/21 0335  Weight: 57.3 kg 55.4 kg 57.7 kg   BP 106/69 (BP Location: Right Leg)   Pulse 90   Temp 97.6 F (36.4 C) (Oral)   Resp 16   Ht 4\' 9"  (1.448 m)   Wt 57.7 kg   SpO2 100%   BMI 27.55 kg/m   Patient is feeling better, no dyspnea or chest pain, no edema  Neurology awake and alert ENT with no pallor Cardiovascular with S1 and S2 present with no rubs, positive systolic murmur at the apex No JVD No lower extremity edema  Respiratory with no rales or wheezing Abdomen not distended  Condition at discharge: stable  The results of significant diagnostics from this hospitalization (including imaging, microbiology, ancillary and laboratory) are listed below for reference.   Imaging Studies: DG  Chest 2 View  Result Date: 12/17/2021 CLINICAL DATA:  Shortness of breath EXAM: CHEST - 2 VIEW COMPARISON:  Correlation is made to CT of the chest dated December 10, 2021 and chest x-ray dated March 04, 2021 FINDINGS: There is moderate cardiomegaly. There is mild pulmonary vascular congestion. No focal consolidation or pleural effusion seen. Bony thorax is unremarkable. IMPRESSION: Moderate cardiomegaly and mild pulmonary vascular congestion Electronically Signed   By: Marjo Bicker M.D.   On: 12/17/2021 16:34   DG Chest 2 View  Result Date: 12/10/2021 CLINICAL DATA:  Abdominal pain for the last week as well as increased shortness of breath and chest pain. EXAM: CHEST - 2 VIEW COMPARISON:  Chest radiograph 03/04/2021 and earlier FINDINGS: Mildly enlarged cardiac silhouette, stable. Subsegmental atelectasis in the lung bases. Otherwise, no focal consolidation, pleural effusion, or pneumothorax. No acute osseous abnormality. IMPRESSION: Subsegmental bibasilar atelectasis.  Stable mild cardiomegaly. Electronically Signed   By: Sherron Ales M.D.   On: 12/10/2021 08:06   CT Angio Chest PE W and/or Wo Contrast  Result Date: 12/10/2021 CLINICAL DATA:  Epigastric pain in a 61 year old female. EXAM: CT ANGIOGRAPHY CHEST CT ABDOMEN AND PELVIS WITH CONTRAST TECHNIQUE: Multidetector CT imaging of the chest was performed using the standard protocol during bolus administration of intravenous contrast. Multiplanar CT image reconstructions and MIPs were obtained to evaluate the vascular anatomy. Multidetector CT imaging of the abdomen and pelvis was performed using the standard protocol during bolus administration of intravenous contrast. RADIATION DOSE REDUCTION: This exam was performed according to the departmental dose-optimization program which includes automated exposure control, adjustment of the mA and/or kV according to patient size and/or use of iterative reconstruction technique. CONTRAST:  OMNIPAQUE IOHEXOL  350 MG/ML SOLN COMPARISON:  Comparison is made with July 01, 2020. FINDINGS: CTA CHEST FINDINGS Cardiovascular: Central pulmonary vasculature is normal caliber with 586 Hounsfield unit density. This study is negative for pulmonary embolism. Heart size remains enlarged with moderate to marked enlargement, four-chamber enlargement. There is reflux of contrast into hepatic veins. The aorta is not well evaluated given phase of contrast enhancement but shows normal caliber. Mediastinum/Nodes: No thoracic inlet, axillary, mediastinal or hilar adenopathy. Esophagus grossly normal. Lungs/Pleura: Signs of septal thickening, smooth septal thickening throughout the chest. No pleural effusion. No dense consolidation. Airways are patent. No pneumothorax. Musculoskeletal: No acute musculoskeletal process about the bony thorax, see below for full musculoskeletal details. Spinal degenerative changes. Review of the MIP images confirms the above findings. CT ABDOMEN and PELVIS FINDINGS Hepatobiliary: Query hepatic steatosis with mild heterogeneity of hepatic enhancement. The portal vein is patent. Hepatic veins are patent. Delayed phase  with more heterogeneous pattern of enhancement. Small cyst in the central liver adjacent to the gallbladder fossa. Pericholecystic stranding and gallbladder wall thickening without substantial gallbladder distension. Trace pericholecystic fluid and perihepatic fluid which is distant from pericholecystic fluid and stranding. No focal, suspicious hepatic lesion. Pancreas: Normal, without mass, inflammation or ductal dilatation. Spleen: Normal. Adrenals/Urinary Tract: Adrenal glands are unremarkable. Symmetric renal enhancement. No sign of hydronephrosis. No suspicious renal lesion or perinephric stranding. Urinary bladder is grossly unremarkable. Mild renal cortical scarring. Stomach/Bowel: Small hiatal hernia. No acute gastrointestinal findings. Appendix is normal. Vascular/Lymphatic: Aorta with  smooth contours. IVC with smooth contours. No aneurysmal dilation of the abdominal aorta. There is no gastrohepatic or hepatoduodenal ligament lymphadenopathy. No retroperitoneal or mesenteric lymphadenopathy. No pelvic sidewall lymphadenopathy. Reproductive: Post hysterectomy without adnexal mass. Other: Trace ascites about the liver.  No pneumoperitoneum. Musculoskeletal: No acute bone finding. No destructive bone process. Spinal degenerative changes. Review of the MIP images confirms the above findings. IMPRESSION: 1. This study is negative for pulmonary embolism. 2. Marked cardiomegaly with signs of heart failure with trace effusions and pulmonary edema. 3. Reflux of contrast into hepatic veins under scoring the signs of cardiac dysfunction on the current study. 4. Heterogeneous enhancement pattern of the liver may be due to congestive hepatopathy. 5. Gallbladder wall thickening and pericholecystic stranding and edema could also be seen in the setting of congestive hepatopathy but in a patient with RIGHT upper quadrant pain consider HIDA scan to add specificity and exclude the possibility of cholecystitis given primary patient symptoms are reportedly in the abdomen. 6. Trace ascites about the liver. 7. Small hiatal hernia. 8. Mild renal cortical scarring. Electronically Signed   By: Donzetta Kohut M.D.   On: 12/10/2021 09:56   NM Hepatobiliary Liver Func  Result Date: 12/10/2021 CLINICAL DATA:  Concern for cholecystitis.  Epigastric pain EXAM: NUCLEAR MEDICINE HEPATOBILIARY IMAGING TECHNIQUE: Sequential images of the abdomen were obtained out to 60 minutes following intravenous administration of radiopharmaceutical. RADIOPHARMACEUTICALS:  5.2 mCi Tc-68m  Choletec IV COMPARISON:  CT 12/10/2021 FINDINGS: Prompt uptake and biliary excretion of activity by the liver is seen. Gallbladder activity is visualized, consistent with patency of cystic duct. Biliary activity passes into small bowel, consistent with  patent common bile duct. IMPRESSION: 1. Gallbladder fills.  No evidence of cholecystitis. 2. Patent cystic duct and common bile duct. Electronically Signed   By: Genevive Bi M.D.   On: 12/10/2021 16:51   CT ABDOMEN PELVIS W CONTRAST  Result Date: 12/10/2021 CLINICAL DATA:  Epigastric pain in a 61 year old female. EXAM: CT ANGIOGRAPHY CHEST CT ABDOMEN AND PELVIS WITH CONTRAST TECHNIQUE: Multidetector CT imaging of the chest was performed using the standard protocol during bolus administration of intravenous contrast. Multiplanar CT image reconstructions and MIPs were obtained to evaluate the vascular anatomy. Multidetector CT imaging of the abdomen and pelvis was performed using the standard protocol during bolus administration of intravenous contrast. RADIATION DOSE REDUCTION: This exam was performed according to the departmental dose-optimization program which includes automated exposure control, adjustment of the mA and/or kV according to patient size and/or use of iterative reconstruction technique. CONTRAST:  OMNIPAQUE IOHEXOL 350 MG/ML SOLN COMPARISON:  Comparison is made with July 01, 2020. FINDINGS: CTA CHEST FINDINGS Cardiovascular: Central pulmonary vasculature is normal caliber with 586 Hounsfield unit density. This study is negative for pulmonary embolism. Heart size remains enlarged with moderate to marked enlargement, four-chamber enlargement. There is reflux of contrast into hepatic veins. The aorta is not well evaluated given  phase of contrast enhancement but shows normal caliber. Mediastinum/Nodes: No thoracic inlet, axillary, mediastinal or hilar adenopathy. Esophagus grossly normal. Lungs/Pleura: Signs of septal thickening, smooth septal thickening throughout the chest. No pleural effusion. No dense consolidation. Airways are patent. No pneumothorax. Musculoskeletal: No acute musculoskeletal process about the bony thorax, see below for full musculoskeletal details. Spinal  degenerative changes. Review of the MIP images confirms the above findings. CT ABDOMEN and PELVIS FINDINGS Hepatobiliary: Query hepatic steatosis with mild heterogeneity of hepatic enhancement. The portal vein is patent. Hepatic veins are patent. Delayed phase with more heterogeneous pattern of enhancement. Small cyst in the central liver adjacent to the gallbladder fossa. Pericholecystic stranding and gallbladder wall thickening without substantial gallbladder distension. Trace pericholecystic fluid and perihepatic fluid which is distant from pericholecystic fluid and stranding. No focal, suspicious hepatic lesion. Pancreas: Normal, without mass, inflammation or ductal dilatation. Spleen: Normal. Adrenals/Urinary Tract: Adrenal glands are unremarkable. Symmetric renal enhancement. No sign of hydronephrosis. No suspicious renal lesion or perinephric stranding. Urinary bladder is grossly unremarkable. Mild renal cortical scarring. Stomach/Bowel: Small hiatal hernia. No acute gastrointestinal findings. Appendix is normal. Vascular/Lymphatic: Aorta with smooth contours. IVC with smooth contours. No aneurysmal dilation of the abdominal aorta. There is no gastrohepatic or hepatoduodenal ligament lymphadenopathy. No retroperitoneal or mesenteric lymphadenopathy. No pelvic sidewall lymphadenopathy. Reproductive: Post hysterectomy without adnexal mass. Other: Trace ascites about the liver.  No pneumoperitoneum. Musculoskeletal: No acute bone finding. No destructive bone process. Spinal degenerative changes. Review of the MIP images confirms the above findings. IMPRESSION: 1. This study is negative for pulmonary embolism. 2. Marked cardiomegaly with signs of heart failure with trace effusions and pulmonary edema. 3. Reflux of contrast into hepatic veins under scoring the signs of cardiac dysfunction on the current study. 4. Heterogeneous enhancement pattern of the liver may be due to congestive hepatopathy. 5. Gallbladder  wall thickening and pericholecystic stranding and edema could also be seen in the setting of congestive hepatopathy but in a patient with RIGHT upper quadrant pain consider HIDA scan to add specificity and exclude the possibility of cholecystitis given primary patient symptoms are reportedly in the abdomen. 6. Trace ascites about the liver. 7. Small hiatal hernia. 8. Mild renal cortical scarring. Electronically Signed   By: Donzetta Kohut M.D.   On: 12/10/2021 09:56   CARDIAC CATHETERIZATION  Result Date: 12/21/2021 Findings: RA = 4 RV = 59/7 PA = 62/30 (42) PCW = 30 (v waves to 35-40) Fick cardiac output/index = 4.7/3.1 PVR = 2.6 WU Ao sat = 99% PA sat = 74%, 75% SVC sat = 76% PAPi = 8.0 Assessment: 1. Normal cardiac output with markedly elevated left-sided filling pressures. Plan/Discussion: Continue diuresis. Arvilla Meres, MD 7:24 PM  MR CARDIAC MORPHOLOGY W WO CONTRAST  Result Date: 12/21/2021 CLINICAL DATA:  73F with NICM, EF 20% on echo EXAM: CARDIAC MRI TECHNIQUE: The patient was scanned on a 1.5 Tesla Siemens magnet. A dedicated cardiac coil was used. Functional imaging was done using Fiesta sequences. 2,3, and 4 chamber views were done to assess for RWMA's. Modified Simpson's rule using a short axis stack was used to calculate an ejection fraction on a dedicated work [employer]. The patient received 8 cc of Gadavist. After 10 minutes inversion recovery sequences were used to assess for infiltration and scar tissue. CONTRAST:  8 cc  of Gadavist FINDINGS: Left ventricle: -Severe dilatation -Severe systolic dysfunction -ECV elevation (38%) -Lateral wall midwall LGE -RV insertion site LGE LV EF:  17% (Normal 56-78%)  Absolute volumes: LV EDV: (Normal 52-141 mL) LV ESV: (Normal 13-51 mL) LV SV: 37mL (Normal 33-97 mL) CO: 3.4L/min (Normal 2.7-6.0 L/min) Indexed volumes: LV EDV: 129mL/sq-m (Normal 41-81 mL/sq-m) LV ESV: 132mL/sq-m (Normal 12-21 mL/sq-m) LV SV: 46mL/sq-m  (Normal 26-56 mL/sq-m) CI: 2.2L/min/sq-m (Normal 1.8-3.8 L/min/sq-m) Right ventricle: Mild dilatation with moderate systolic dysfunction RV EF: 30% (Normal 47-80%) Absolute volumes: RV EDV: (Normal 58-154 mL) RV ESV: (Normal 12-68 mL) RV SV: 44mL (Normal 35-98 mL) CO: 4.0L/min (Normal 2.7-6 L/min) Indexed volumes: RV EDV: 8mL/sq-m (Normal 48-87 mL/sq-m) RV ESV: 16mL/sq-m (Normal 11-28 mL/sq-m) RV SV: 74mL/sq-m (Normal 27-57 mL/sq-m) CI: 2.7L/min/sq-m (Normal 1.8-3.8 L/min/sq-m) Left atrium: Severe enlargement Right atrium: Mild enlargement Mitral valve: Moderate regurgitation (regurgitant fraction 32%) Aortic valve: Trivial regurgitation Tricuspid valve: Moderate to severe regurgitation Pulmonic valve: Trivial regurgitation Aorta: Normal proximal ascending aorta Pericardium: Moderate effusion measuring up to 12mm adjacent to LV lateral wall IMPRESSION: 1.  Severe LV dilatation with severe systolic dysfunction (EF 17%) 2.  Mild RV dilatation with moderate systolic dysfunction (EF 30%) 3. Midwall LGE in LV lateral wall, which is a scar pattern from nonischemic etiology such as prior myocarditis 4. RV insertion site LGE, which is a nonspecific scar pattern often seen in setting of elevated pulmonary pressures 5.  Moderate pericardial effusion 6.  Moderate mitral regurgitation (regurgitant fraction 32%) 7. Moderate to severe tricuspid regurgitation (was not quantified) Electronically Signed   By: Epifanio Lesches M.D.   On: 12/21/2021 22:50   US Abdomen Limited RUQ (LIVER/GB)  Result Date: 12/17/2021 CLINICAL DATA:  Epigastric pain EXAM: ULTRASOUND ABDOMEN LIMITED RIGHT UPPER QUADRANT COMPARISON:  None. FINDINGS: Gallbladder: No gallstones or wall thickening visualized. No sonographic Murphy sign noted by sonographer. Common bile duct: Diameter: Normal caliber, 5 mm Liver: Heterogeneous echotexture throughout the liver. Subtle nodularity to the liver surface suggests the possibility of cirrhosis.  No focal hepatic abnormality. Portal vein is patent on color Doppler imaging with normal direction of blood flow towards the liver. Other: None. IMPRESSION: No cholelithiasis or acute cholecystitis. Heterogeneous echotexture and subtle nodularity to the liver surface suggests the possibility of cirrhosis. Recommend clinical correlation. Electronically Signed   By: Charlett Nose M.D.   On: 12/17/2021 20:00    Microbiology: Results for orders placed or performed during the hospital encounter of 12/17/21  Resp Panel by RT-PCR (Flu A&B, Covid) Nasopharyngeal Swab     Status: None   Collection Time: 12/17/21  3:40 PM   Specimen: Nasopharyngeal Swab; Nasopharyngeal(NP) swabs in vial transport medium  Result Value Ref Range Status   SARS Coronavirus 2 by RT PCR NEGATIVE NEGATIVE Final    Comment: (NOTE) SARS-CoV-2 target nucleic acids are NOT DETECTED.  The SARS-CoV-2 RNA is generally detectable in upper respiratory specimens during the acute phase of infection. The lowest concentration of SARS-CoV-2 viral copies this assay can detect is 138 copies/mL. A negative result does not preclude SARS-Cov-2 infection and should not be used as the sole basis for treatment or other patient management decisions. A negative result may occur with  improper specimen collection/handling, submission of specimen other than nasopharyngeal swab, presence of viral mutation(s) within the areas targeted by this assay, and inadequate number of viral copies(<138 copies/mL). A negative result must be combined with clinical observations, patient history, and epidemiological information. The expected result is Negative.  Fact Sheet for Patients:  BloggerCourse.com  Fact Sheet for Healthcare Providers:  SeriousBroker.it  This test is no t yet approved or cleared by the  [place] [employer] and  has been authorized for detection and/or diagnosis of SARS-CoV-2 by FDA under an  TEFL teacher (EUA). This EUA will remain  in effect (meaning this test can be used) for the duration of the COVID-19 declaration under Section 564(b)(1) of the Act, 21 U.S.C.section 360bbb-3(b)(1), unless the authorization is terminated  or revoked sooner.       Influenza A by PCR NEGATIVE NEGATIVE Final   Influenza B by PCR NEGATIVE NEGATIVE Final    Comment: (NOTE) The Xpert Xpress SARS-CoV-2/FLU/RSV plus assay is intended as an aid in the diagnosis of influenza from Nasopharyngeal swab specimens and should not be used as a sole basis for treatment. Nasal washings and aspirates are unacceptable for Xpert Xpress SARS-CoV-2/FLU/RSV testing.  Fact Sheet for Patients: BloggerCourse.com  Fact Sheet for Healthcare Providers: SeriousBroker.it  This test is not yet approved or cleared by the [place] FDA and has been authorized for detection and/or diagnosis of SARS-CoV-2 by FDA under an Emergency Use Authorization (EUA). This EUA will remain in effect (meaning this test can be used) for the duration of the COVID-19 declaration under Section 564(b)(1) of the Act, 21 U.S.C. section 360bbb-3(b)(1), unless the authorization is terminated or revoked.  Performed at [facility], [address], [place], [place] [address]   Urine Culture     Status: Abnormal   Collection Time: 12/17/21 11:00 PM   Specimen: Urine, Clean Catch  Result Value Ref Range Status   Specimen Description URINE, CLEAN CATCH  Final   Special Requests NONE  Final   Culture (A)  Final    <10,000 COLONIES/mL INSIGNIFICANT GROWTH Performed at [facility], [address], [place], [place] [address]    Report Status 12/19/2021 FINAL  Final    Labs: CBC: Recent Labs  Lab 12/19/21 0344 12/20/21 0245 12/21/21 0153 12/21/21 1458 12/21/21 1503 12/22/21 0438 12/23/21 0353  WBC 5.2 6.0 5.9  --   --  5.9 6.1  HGB 11.5* 12.8 13.6  16.0*  16.0* 15.6* 14.6 14.8  HCT 35.1* 38.0 41.1 47.0*  47.0* 46.0 45.6 45.8  MCV 87.5 86.8 85.6  --   --  86.7 85.0  PLT 244 306 351  --   --  380 404*   Basic Metabolic Panel: Recent Labs  Lab 12/21/21 0153 12/21/21 1458 12/21/21 1503 12/22/21 0438 12/23/21 0353 12/23/21 1436 12/24/21 0258 12/25/21 0308  NA 133*   < > 136 133* 131*  --  129* 128*  K 3.3*   < > 4.0 4.3 3.9  --  3.6 4.5  CL 94*  --   --  96* 92*  --  89* 92*  CO2 28  --   --  24 25  --  26 22  GLUCOSE 94  --   --  94 95  --  124* 95  BUN 18  --   --  19 24*  --  31* 25*  CREATININE 1.20*  --   --  1.07* 1.16*  --  1.33* 0.94  CALCIUM 9.4  --   --  9.5 9.9  --  10.0 9.4  MG 2.2  --   --  2.3 2.3 2.3 2.4  --    < > = values in this interval not displayed.   Liver Function Tests: Recent Labs  Lab 12/19/21 0344 12/20/21 0245 12/21/21 0153 12/22/21 0438 12/23/21 0353  AST 46* 31 27 26 29   ALT 82* 67* 52* 38  35  ALKPHOS 162* 162* 162* 155* 142*  BILITOT 0.9 1.0 1.1 0.9 0.9  PROT 5.6* 6.6 7.0 7.1 7.4  ALBUMIN 3.2* 3.7 3.9 4.1 4.3   CBG: No results for input(s): GLUCAP in the last 168 hours.  Discharge time spent: greater than 30 minutes.  Signed: Coralie Keens, MD [facility] 12/25/2021

## 2021-12-25 NOTE — Evaluation (Addendum)
Physical Therapy Evaluation ?Patient Details ?Name: Megan Keith ?MRN: [id number] ?DOB: February 15, 1961 ?Today's Date: 12/25/2021 ? ?History of Present Illness ? 61 y/o female admitted 4/27 with SOB, founfd to have acute exacerbation of CHF.  PMH includes chornic systolic CHF, NICM, PAF/ectopic atrial tach, mitral regurgitation, hyperthyroidism, PVCs ? ?  ?Clinical Impression ? Pt presents sitting EOB and agreeable to therapy.  Pt states generalized weakness 2/2 decreased sleep ability.  Pt performed bed mobility and transfers w/ Independence.  Pt amb into hallway w/o AD > 200' w/ mod I for speed.  Pt negotiated 3 steps initially w/ min A 2/2 not using handrails to simulate home entrance.  Pt then used 1 hand rail and supervision.  Pt performed balance activities during gait including c/spine flex/ext, rotation, marching and quick stop/starts w/o LOB.  Pt returned to room and remained sitting EOB w/ family member present.  Pt safe to return home when medically stable. ?   ? ?Recommendations for follow up therapy are one component of a multi-disciplinary discharge planning process, led by the attending physician.  Recommendations may be updated based on patient status, additional functional criteria and insurance authorization. ? ?Follow Up Recommendations Home health PT ? ?  ?Assistance Recommended at Discharge PRN  ?Patient can return home with the following ? Help with stairs or ramp for entrance ? ?  ?Equipment Recommendations None recommended by PT (pt has SPC for PRN use at home.)  ?Recommendations for Other Services ?    ?  ?Functional Status Assessment Patient has had a recent decline in their functional status and demonstrates the ability to make significant improvements in function in a reasonable and predictable amount of time.  ? ?  ?Precautions / Restrictions Precautions ?Precautions: Fall ?Precaution Comments: c/o B knee pain at all times, increased w/ stair negotiation. ?Restrictions ?Weight Bearing  Restrictions: No  ? ?  ? ?Mobility ? Bed Mobility ?Overal bed mobility: Independent ?  ?  ?  ?  ?  ?  ?  ?  ? ?Transfers ?Overall transfer level: Independent ?Equipment used: None ?  ?  ?  ?  ?  ?  ?  ?  ?  ? ?Ambulation/Gait ?Ambulation/Gait assistance: Modified independent (Device/Increase time) ?Gait Distance (Feet): 200 Feet ?Assistive device: None ?Gait Pattern/deviations: Antalgic, WFL(Within Functional Limits) ?Gait velocity: decreased ?Gait velocity interpretation: 1.31 - 2.62 ft/sec, indicative of limited community ambulator ?  ?  ? ?Stairs ?Stairs: Yes ?Stairs assistance: Min guard ?Stair Management: One rail Left ?Number of Stairs: 3 ?General stair comments: Pt initiated stairs w/o rail, but w/ noted antalgic gait pattern ascending w/ LLE and step-to pattern, progressed w/ L rail and then turned and descended w/ step-to pattern but almost a sidestepping using L rail. ? ?Wheelchair Mobility ?  ? ?Modified Rankin (Stroke Patients Only) ?  ? ?  ? ?Balance Overall balance assessment: Mild deficits observed, not formally tested (Pt performed marching, head side to side, up/down and quick stop/starts w/o LOB.) ?  ?  ?  ?  ?  ?  ?  ?  ?  ?  ?  ?  ?  ?  ?  ?  ?  ?  ?   ? ? ? ?Pertinent Vitals/Pain Pain Assessment ?Pain Assessment: 0-10 ?Pain Location: B knees w/ WB activity and increased w/ stair negotiation, but does not quantify. ?Pain Descriptors / Indicators: Aching ?Pain Intervention(s): Other (comment) (Pt states no need for pain meds at conclusion of session.)  ? ? ?Home  Living Family/patient expects to be discharged to:: Private residence ?Living Arrangements: Other relatives (granddaughter) ?Available Help at Discharge: Family;Friend(s);Available 24 hours/day ?Type of Home: Mobile home ?Home Access: Stairs to enter ?Entrance Stairs-Rails: None ?Entrance Stairs-Number of Steps: 3 ?  ?Home Layout: One level ?Home Equipment:  (uses SPC PRN.) ?   ?  ?Prior Function Prior Level of Function :  Independent/Modified Independent ?  ?  ?  ?  ?  ?  ?Mobility Comments: reports independent but difficult for a long time due to pain ?ADLs Comments: Pt. was Mod i but required rest breaks. ?  ? ? ?Hand Dominance  ? Dominant Hand: Right ? ?  ?Extremity/Trunk Assessment  ? Upper Extremity Assessment ?Upper Extremity Assessment: Defer to OT evaluation ?  ? ?Lower Extremity Assessment ?Lower Extremity Assessment: Overall WFL for tasks assessed;Generalized weakness (Pt states weakness 2/2 inability to sleep but able to perform safely all activities.) ?  ? ?   ?Communication  ? Communication: Prefers language other than [place];Interpreter utilized Celesta Aver [phone])  ?Cognition Arousal/Alertness: Awake/alert ?Behavior During Therapy: [facility] for tasks assessed/performed ?Overall Cognitive Status: Within Functional Limits for tasks assessed ?  ?  ?  ?  ?  ?  ?  ?  ?  ?  ?  ?  ?  ?  ?  ?  ?General Comments: interpreter required and appears WFL, family member present at conclusion. ?  ?  ? ?  ?General Comments   ? ?  ?Exercises    ? ?Assessment/Plan  ?  ?PT Assessment All further PT needs can be met in the next venue of care  ?PT Problem List Decreased strength;Pain;Decreased mobility ? ?   ?  ?PT Treatment Interventions     ? ?PT Goals (Current goals can be found in the Care Plan section)  ?Acute Rehab PT Goals ?Patient Stated Goal: to go home ?PT Goal Formulation: With patient ?Time For Goal Achievement: 01/01/22 ?Potential to Achieve Goals: Good ? ?  ?Frequency   ?  ? ? ?Co-evaluation   ?  ?  ?  ?  ? ? ?  ?AM-PAC PT "6 Clicks" Mobility  ?Outcome Measure Help needed turning from your back to your side while in a flat bed without using bedrails?: None ?Help needed moving from lying on your back to sitting on the side of a flat bed without using bedrails?: None ?Help needed moving to and from a bed to a chair (including a wheelchair)?: None ?Help needed standing up from a chair using your arms (e.g., wheelchair or bedside  chair)?: None ?Help needed to walk in hospital room?: None ?Help needed climbing 3-5 steps with a railing? : A Little ?6 Click Score: 23 ? ?  ?End of Session Equipment Utilized During Treatment: Gait belt ?Activity Tolerance: Patient tolerated treatment well ?Patient left: Other (comment) (sitting EOB w/ family member present.) ?Nurse Communication: Mobility status;Other (comment) (pt request for sleeping and pain medication.) ?PT Visit Diagnosis: Muscle weakness (generalized) (M62.81);Difficulty in walking, not elsewhere classified (R26.2) ?  ? ?Time: 5726-2035 ?PT Time Calculation (min) (ACUTE ONLY): 27 min ? ? ?Charges:   PT Evaluation ?$PT Eval Low Complexity: 1 Low ?PT Treatments ?$Gait Training: 8-22 mins ?  ?   ? ?Ladoris Gene, PT  ? ?Ladoris Gene ?12/25/2021, 4:53 PM ? ?

## 2021-12-25 NOTE — Progress Notes (Signed)
Initial Nutrition Assessment ? ?DOCUMENTATION CODES:  ? ?Not applicable ? ?INTERVENTION:  ?Provide Ensure Enlive po once daily, each supplement provides 350 kcal and 20 grams of protein. ? ?Encourage adequate PO intake.  ? ?NUTRITION DIAGNOSIS:  ? ?Increased nutrient needs related to chronic illness (CHF) as evidenced by estimated needs. ? ?GOAL:  ? ?Patient will meet greater than or equal to 90% of their needs ? ?MONITOR:  ? ?PO intake, Supplement acceptance, Labs, Weight trends, Skin, I & O's ? ?REASON FOR ASSESSMENT:  ? ?Consult ?Assessment of nutrition requirement/status ? ?ASSESSMENT:  ? ?61 y.o. female with chornic systolic CHF, PAF/ectopic atrial tach, mitral regurgitation, hyperthyroidism, PVCs, history left-sided breast cancer Rx mastectomy XRT. Admitted with a/c systolic CHF. ? ?Status video [place] interpreter used, Michiel Cowboy [phone]. Pt reports currently having a good appetite. Meal completion has been varied from 10-100% with most intake of at least 75%. Pt does report poor appetite for 3 weeks prior to admission with abdominal pains/discomfort associated with eating, thus had poor po during that time. Prior to those 3 weeks, pt reports eating very well with no difficulties. Pt usually follows a low sodium diet at home. Pt underwent diuresis. Plans for discharge home today. RD to order Ensure to aid in caloric and protein needs.  ? ?NUTRITION - FOCUSED PHYSICAL EXAM: ? ?Flowsheet Row Most Recent Value  ?Orbital Region No depletion  ?Upper Arm Region No depletion  ?Thoracic and Lumbar Region No depletion  ?Buccal Region No depletion  ?Temple Region No depletion  ?Clavicle Bone Region No depletion  ?Clavicle and Acromion Bone Region No depletion  ?Scapular Bone Region No depletion  ?Dorsal Hand No depletion  ?Patellar Region No depletion  ?Anterior Thigh Region No depletion  ?Posterior Calf Region No depletion  ?Edema (RD Assessment) Mild  ?Hair Reviewed  ?Eyes Reviewed  ?Mouth Reviewed  ?Skin Reviewed   ?Nails Reviewed  ? ?  ? ?Labs and medications reviewed.  ? ?Diet Order:   ?Diet Order   ? ?       ?  Diet - low sodium heart healthy       ?  ?  Diet Heart Room service appropriate? Yes; Fluid consistency: Thin  Diet effective now       ?  ? ?  ?  ? ?  ? ? ?EDUCATION NEEDS:  ? ?Education needs have been addressed ? ?Skin:  Skin Assessment: Reviewed RN Assessment ? ?Last BM:  5/4 ? ?Height:  ? ?Ht Readings from Last 1 Encounters:  ?12/18/21 '4\' 9"'$  (1.448 m)  ? ? ?Weight:  ? ?Wt Readings from Last 1 Encounters:  ?12/25/21 57.7 kg  ? ?BMI:  Body mass index is 27.55 kg/m?. ? ?Estimated Nutritional Needs:  ? ?Kcal:  1700-1900 ? ?Protein:  85-95 grams ? ?Fluid:  >/= 1.7 L/day ? ?Corrin Parker, MS, RD, LDN ?RD pager number/after hours weekend pager number on Amion. ? ?

## 2021-12-30 NOTE — Progress Notes (Signed)
?Advanced Heart Failure Clinic Progress Note  ? ?Referring Physician: ?PCP: Earna Coder, NP ?PCP-Cardiologist: Glori Bickers, MD  ? ?Reason for Visit: [facility] F/u for Systolic Heart Failure ? ?HPI: ? ?Megan Keith is a 61 y.o.female with chronic systolic CHF (Echo EF 16-07% in 03/2017)  PAF, cervical cancer, PVCs, and hyperthyroidisum.    ?  ?Admitted to [facility] 8/18 with ADHF. Prior EF with Dr. [name] was 35%. Repeat echo EF 15%. Transferred to [facility]. R/LHC  no CAD and fick output/index 5.3/3.3. Noted to have >20% PVC on telemetry. Started on amiodarone which she susequently stopped.  ?  ?Admitted 7/20 with ADHF with new-onset AF in the setting of hyperthyroidism. TEE with EF 10% RV ok. Started on amiodarone and diuresed. Underwent TEE/DC-CV and felt much better. Also treated with methimazole and prednisone.  ?  ?Admitted 10/20 for COVID 19 infection c/b recurrent afib w/ RVR. Loaded on amio and converted. ?  ?Admitted from HF clinic 11/20 with A Fib RVR -> ADHF.Methimazole and prednisone continued for hyperthyroidism.  EP was consulted. Initial plans were for PVI ablation but procedure canceled after TEE showed severely reduced EF at 5% and severe LAE. Had DC-CV with conversion to NSR.  She was also treated w/ Losartan (BP too soft for Entesto), spironolactone and digoxin.  ?  ?Saw EP 10/22/19 and was still in NSR. Discussed possible AF ablation. There was concern over degree of MR and LAE.  ?  ?She finished treatment for hyperthyroidism with Dr. [name] 5/21.   ?  ?Admitted [name] 9/21 She was reportedly treated for mild hemoptysis in setting of PNA and CHF. Treated w/ abx and lasix. Had low BP and was taken off losartan and Coreg.   ?  ?11/21 High PVC burden so Mexitil 200 mg bid started.  ? ?Repeat Echo (4/22) EF 20-25%  ?  ?Zio (5/22) (on mexiletine). Occasional PVCs, burden low at 3.3%. Also had 32 runs of SVT, the longest lasting 10 beats w/ average HR 104 bpm.  ?  ?Diagnosed  w/ cervical cancer 7/22. ?  ?Underwent RHC 03/12/21 elevated filling pressures and preserved CO (see below). She was given metolazone 2.5 x 2 days and lasix increased to 60 mg daily. ?  ?S/p total hysertectomy 05/08/21 for cervical cancer.  Lasix was held with low BP with instructions to restart when sBP>100. ?  ?Found to be in ectopic AT, amiodarone restarted 1/23. Follow up 2/23 breathing improved and weight was down. Amiodarone reduced. ?  ?Echo 2/23 showed EF 20%, severe LV dysfunction, grade III DD, RV moderately reduced, moderate MR, TR moderate to severe ?  ?Acute visit 10/27/21, volume mildly up, lasix stopped and torsemide 40 mg started. She was switched back to lasix because she had back pain.   ? ?OV 4/11, felt terrible, complaining of shortness of breath, palpitations, and orthopnea. Was in atrial tach. Reported full compliance w/ Eliquis. Was set up for outpatient DCCV. Presented for planned procedure on 4/17 and was back in NSR. Procedure canceled. ? ?Admitted 3/71 w/ a/c systolic heart failure and volume overload. She was in NSR. Diuresed w/ IV Lasix. RHC showed normal cardiac output w/ markedly elevated left-sided filling pressures (see data below). She was diuresed further w/ IV Lasix. CMRI showed severe LV dilation. EF 17%, mod RV dysfunction.  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  ? ?After diuresis w/ IV lasix transitioned to PO, 60 mg daily. Unable to  tolerate ARB/ARNi due to hypotension. Digoxin discontinued given elevated level. Discharged home on 5/5. D/c wt 126 lb.  ? ?Presents today for f/u. Here w/ granddaughter. Reports feeling bad. SOB w/ activity, NYHA Class II-early III. No resting dyspnea. Denies orthopnea/PND. No LEE. Wt up 4 lb post hospital. Reports full compliance w/ Lasix and good UOP in the AM but slows down ~2 pm. Complains of palpitations. EKG shows sinus tach 103 bpm. Also some occasional positional dizziness but no syncope/  near syncope. BP soft 103/78.  Also has f/u w/ PCP later today.  ? ? ?[place] 5/23 ?RA = 4 ?RV = 59/7 ?PA = 62/30 (42) ?PCW = 30 (v waves to 35-40) ?Fick cardiac output/index = 4.7/3.1 ?PVR = 2.6 WU ?Ao sat = 99% ?PA sat = 74%, 75% ?SVC sat = 76% ?  ?PAPi = 8.0 ?  ?Assessment: ?1. Normal cardiac output with markedly elevated left-sided filling pressures. ? ?Review of Systems: [y] = yes, '[ ]'$  = no  ? ?General: Weight gain [ Y]; Weight loss '[ ]'$ ; Anorexia '[ ]'$ ; Fatigue [Y ]; Fever '[ ]'$ ; Chills '[ ]'$ ; Weakness '[ ]'$   ?Cardiac: Chest pain/pressure '[ ]'$ ; Resting SOB '[ ]'$ ; Exertional SOB [ Y]; Orthopnea '[ ]'$ ; Pedal Edema '[ ]'$ ; Palpitations [ Y]; Syncope '[ ]'$ ; Presyncope '[ ]'$ ; Paroxysmal nocturnal dyspnea'[ ]'$   ?Pulmonary: Cough '[ ]'$ ; Wheezing'[ ]'$ ; Hemoptysis'[ ]'$ ; Sputum '[ ]'$ ; Snoring '[ ]'$   ?GI: Vomiting'[ ]'$ ; Dysphagia'[ ]'$ ; Melena'[ ]'$ ; Hematochezia '[ ]'$ ; Heartburn'[ ]'$ ; Abdominal pain '[ ]'$ ; Constipation '[ ]'$ ; Diarrhea '[ ]'$ ; BRBPR '[ ]'$   ?GU: Hematuria'[ ]'$ ; Dysuria '[ ]'$ ; Nocturia'[ ]'$   ?Vascular: Pain in legs with walking '[ ]'$ ; Pain in feet with lying flat '[ ]'$ ; Non-healing sores '[ ]'$ ; Stroke '[ ]'$ ; TIA '[ ]'$ ; Slurred speech '[ ]'$ ;  ?Neuro: Headaches'[ ]'$ ; Vertigo'[ ]'$ ; Seizures'[ ]'$ ; Paresthesias'[ ]'$ ;Blurred vision '[ ]'$ ; Diplopia '[ ]'$ ; Vision changes '[ ]'$   ?Ortho/Skin: Arthritis '[ ]'$ ; Joint pain '[ ]'$ ; Muscle pain '[ ]'$ ; Joint swelling '[ ]'$ ; Back Pain '[ ]'$ ; Rash '[ ]'$   ?Psych: Depression'[ ]'$ ; Anxiety'[ ]'$   ?Heme: Bleeding problems '[ ]'$ ; Clotting disorders '[ ]'$ ; Anemia '[ ]'$   ?Endocrine: Diabetes '[ ]'$ ; Thyroid dysfunction[Y ] ? ? ?Past Medical History:  ?Diagnosis Date  ? A-fib ([place])   ? Abnormal TSH   ? Anemia   ? Arthritis   ? "knees, hands" (04/15/2017)  ? Breast cancer ([place])   ? CHF (congestive heart failure) ([place])   ? Chronic knee pain   ? Chronic systolic CHF (congestive heart failure) ([place])   ? COVID-19   ? Does not have health insurance   ? Frequent PVCs   ? Hyperthyroidism 03/05/2019  ? Hyperthyroidism 03/05/2019  ? 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 NICM (nonischemic  cardiomyopathy) ([place])   ? a. ? PVC cardiomyopathy. right and left heart catheterization on 04/15/17, no CAD. Fick output/index 5.28/3.34.  ? Noncompliance   ? a. episodic noncompliance.  ? Pneumonia 06/2016  ? ? ?Current Outpatient Medications  ?Medication Sig Dispense Refill  ? acetaminophen (TYLENOL) 500 MG tablet Take 500 mg by mouth every 6 (six) hours as needed for moderate pain or headache.    ? amiodarone (PACERONE) 200 MG tablet Take 200 mg by mouth 2 (two) times daily.    ? anastrozole (ARIMIDEX) 1 MG tablet Take 1 tablet (1 mg total) by mouth every evening. 90 tablet 3  ? apixaban (ELIQUIS) 5 MG TABS tablet Take 1  tablet (5 mg total) by mouth 2 (two) times daily. 60 tablet 3  ? furosemide (LASIX) 20 MG tablet Take 3 tablets (60 mg total) by mouth daily. 90 tablet 0  ? mexiletine (MEXITIL) 150 MG capsule Take 2 capsules (300 mg total) by mouth every 12 (twelve) hours. 120 capsule 0  ? omeprazole (PRILOSEC) 20 MG capsule Take 20 mg by mouth daily.    ? spironolactone (ALDACTONE) 25 MG tablet Take 1 tablet (25 mg total) by mouth daily. 30 tablet 0  ? ?No current facility-administered medications for this encounter.  ? ? ?No Known Allergies ? ?  ?Social History  ? ?Socioeconomic History  ? Marital status: Single  ?  Spouse name: Not on file  ? Number of children: 4  ? Years of education: Not on file  ? Highest education level: Not on file  ?Occupational History  ? Not on file  ?Tobacco Use  ? Smoking status: Never  ? Smokeless tobacco: Never  ?Vaping Use  ? Vaping Use: Never used  ?Substance and Sexual Activity  ? Alcohol use: Not Currently  ?  Comment: 04/15/2017 "might have a couple beers q couple months"  ? Drug use: Not Currently  ? Sexual activity: Not Currently  ?Other Topics Concern  ? Not on file  ?Social History Narrative  ? ** Merged History Encounter **  ?    ? N/A  ? ?Social Determinants of Health  ? ?Financial Resource Strain: Not on file  ?Food Insecurity: Not on file  ?Transportation Needs: Not on  file  ?Physical Activity: Not on file  ?Stress: Not on file  ?Social Connections: Not on file  ?Intimate Partner Violence: Not on file  ? ? ?  ?Family History  ?Problem Relation Age of Onset  ? Hypertens

## 2021-12-31 ENCOUNTER — Ambulatory Visit (HOSPITAL_COMMUNITY)
Admit: 2021-12-31 | Discharge: 2021-12-31 | Disposition: A | Discharge status: Home / Self Care | Payer: Self-pay | Attending: Cardiology | Admitting: Cardiology

## 2021-12-31 ENCOUNTER — Encounter (HOSPITAL_COMMUNITY): Payer: Self-pay

## 2021-12-31 VITALS — BP 104/78 | HR 104 | Wt 130.2 lb

## 2021-12-31 DIAGNOSIS — I493 Ventricular premature depolarization: Secondary | ICD-10-CM | POA: Insufficient documentation

## 2021-12-31 DIAGNOSIS — I081 Rheumatic disorders of both mitral and tricuspid valves: Secondary | ICD-10-CM | POA: Insufficient documentation

## 2021-12-31 DIAGNOSIS — E0590 Thyrotoxicosis, unspecified without thyrotoxic crisis or storm: Secondary | ICD-10-CM | POA: Insufficient documentation

## 2021-12-31 DIAGNOSIS — Z923 Personal history of irradiation: Secondary | ICD-10-CM | POA: Insufficient documentation

## 2021-12-31 DIAGNOSIS — I428 Other cardiomyopathies: Secondary | ICD-10-CM | POA: Insufficient documentation

## 2021-12-31 DIAGNOSIS — I471 Supraventricular tachycardia: Secondary | ICD-10-CM | POA: Insufficient documentation

## 2021-12-31 DIAGNOSIS — I5022 Chronic systolic (congestive) heart failure: Secondary | ICD-10-CM | POA: Insufficient documentation

## 2021-12-31 DIAGNOSIS — Z8616 Personal history of COVID-19: Secondary | ICD-10-CM | POA: Insufficient documentation

## 2021-12-31 DIAGNOSIS — C539 Malignant neoplasm of cervix uteri, unspecified: Secondary | ICD-10-CM | POA: Insufficient documentation

## 2021-12-31 DIAGNOSIS — Z79899 Other long term (current) drug therapy: Secondary | ICD-10-CM | POA: Insufficient documentation

## 2021-12-31 DIAGNOSIS — I480 Paroxysmal atrial fibrillation: Secondary | ICD-10-CM | POA: Insufficient documentation

## 2021-12-31 DIAGNOSIS — R42 Dizziness and giddiness: Secondary | ICD-10-CM | POA: Insufficient documentation

## 2021-12-31 DIAGNOSIS — C50912 Malignant neoplasm of unspecified site of left female breast: Secondary | ICD-10-CM | POA: Insufficient documentation

## 2021-12-31 DIAGNOSIS — Z7901 Long term (current) use of anticoagulants: Secondary | ICD-10-CM | POA: Insufficient documentation

## 2021-12-31 LAB — BASIC METABOLIC PANEL
Anion gap: 10 (ref 5–15)
BUN: 24 mg/dL — ABNORMAL HIGH (ref 8–23)
CO2: 23 mmol/L (ref 22–32)
Calcium: 9.5 mg/dL (ref 8.9–10.3)
Chloride: 101 mmol/L (ref 98–111)
Creatinine, Ser: 0.82 mg/dL (ref 0.44–1.00)
GFR, Estimated: >60 mL/min (ref >60)
Glucose, Bld: 99 mg/dL (ref 70–99)
Potassium: 3.8 mmol/L (ref 3.5–5.1)
Sodium: 134 mmol/L — ABNORMAL LOW (ref 135–145)

## 2021-12-31 LAB — BRAIN NATRIURETIC PEPTIDE: B Natriuretic Peptide: 828.5 pg/mL — ABNORMAL HIGH (ref 0.0–100.0)

## 2021-12-31 MED ORDER — FUROSEMIDE 20 MG PO TABS: ORAL_TABLET | ORAL | 6 refills | Status: DC

## 2021-12-31 NOTE — Patient Instructions (Signed)
Thank you for coming in today ? ?Labs were done today, if any labs are abnormal the clinic will call you ?No news is good news ? ?INCREASE Lasix to 60 mg every morning and 40 mg every evening ? ?Your physician recommends that you schedule a follow-up appointment in: keep follow up appointment  ? ?At the [facility], you and your health needs are our priority. As part of our continuing mission to provide you with exceptional heart care, we have created designated Provider Care Teams. These Care Teams include your primary Cardiologist (physician) and Advanced Practice Providers (APPs- Physician Assistants and Nurse Practitioners) who all work together to provide you with the care you need, when you need it.  ? ?You may see any of the following providers on your designated Care Team at your next follow up: ?Dr Glori Bickers ?Dr Loralie Champagne ?Darrick Grinder, NP ?Lyda Jester, PA ?Jessica Milford,NP ?Marlyce Huge, PA ?Audry Riles, PharmD ? ? ?Please be sure to bring in all your medications bottles to every appointment.  ? ?If you have any questions or concerns before your next appointment please send [place] a message through [place] or call our office at [phone].   ? ?TO LEAVE A MESSAGE FOR THE NURSE SELECT OPTION 2, PLEASE LEAVE A MESSAGE INCLUDING: ?YOUR NAME ?DATE OF BIRTH ?CALL BACK NUMBER ?REASON FOR CALL**this is important as we prioritize the call backs ? ?YOU WILL RECEIVE A CALL BACK THE SAME DAY AS LONG AS YOU CALL BEFORE 4:00 PM ? ? ?

## 2022-01-06 ENCOUNTER — Other Ambulatory Visit (HOSPITAL_COMMUNITY): Payer: Self-pay | Admitting: Internal Medicine

## 2022-01-09 ENCOUNTER — Encounter (HOSPITAL_COMMUNITY): Payer: Self-pay | Admitting: Emergency Medicine

## 2022-01-09 ENCOUNTER — Emergency Department (HOSPITAL_COMMUNITY): Payer: Self-pay

## 2022-01-09 ENCOUNTER — Observation Stay (HOSPITAL_COMMUNITY): Payer: Self-pay

## 2022-01-09 ENCOUNTER — Other Ambulatory Visit: Payer: Self-pay

## 2022-01-09 ENCOUNTER — Inpatient Hospital Stay (HOSPITAL_COMMUNITY)
Admission: EM | Admit: 2022-01-09 | Discharge: 2022-01-11 | DRG: 871 | Disposition: A | Discharge status: Home / Self Care | Payer: Self-pay | Attending: Family Medicine | Admitting: Family Medicine

## 2022-01-09 ENCOUNTER — Telehealth: Payer: Self-pay | Admitting: Cardiology

## 2022-01-09 DIAGNOSIS — I959 Hypotension, unspecified: Secondary | ICD-10-CM | POA: Diagnosis present

## 2022-01-09 DIAGNOSIS — Z8261 Family history of arthritis: Secondary | ICD-10-CM

## 2022-01-09 DIAGNOSIS — I5022 Chronic systolic (congestive) heart failure: Secondary | ICD-10-CM | POA: Diagnosis present

## 2022-01-09 DIAGNOSIS — A419 Sepsis, unspecified organism: Principal | ICD-10-CM | POA: Diagnosis present

## 2022-01-09 DIAGNOSIS — Z90710 Acquired absence of both cervix and uterus: Secondary | ICD-10-CM

## 2022-01-09 DIAGNOSIS — I472 Ventricular tachycardia, unspecified: Secondary | ICD-10-CM | POA: Diagnosis present

## 2022-01-09 DIAGNOSIS — Z853 Personal history of malignant neoplasm of breast: Secondary | ICD-10-CM

## 2022-01-09 DIAGNOSIS — K219 Gastro-esophageal reflux disease without esophagitis: Secondary | ICD-10-CM | POA: Diagnosis present

## 2022-01-09 DIAGNOSIS — I480 Paroxysmal atrial fibrillation: Secondary | ICD-10-CM | POA: Diagnosis present

## 2022-01-09 DIAGNOSIS — Z8541 Personal history of malignant neoplasm of cervix uteri: Secondary | ICD-10-CM

## 2022-01-09 DIAGNOSIS — I272 Pulmonary hypertension, unspecified: Secondary | ICD-10-CM | POA: Diagnosis present

## 2022-01-09 DIAGNOSIS — Z79811 Long term (current) use of aromatase inhibitors: Secondary | ICD-10-CM

## 2022-01-09 DIAGNOSIS — Z923 Personal history of irradiation: Secondary | ICD-10-CM

## 2022-01-09 DIAGNOSIS — R6520 Severe sepsis without septic shock: Secondary | ICD-10-CM | POA: Diagnosis present

## 2022-01-09 DIAGNOSIS — I5042 Chronic combined systolic (congestive) and diastolic (congestive) heart failure: Secondary | ICD-10-CM | POA: Diagnosis present

## 2022-01-09 DIAGNOSIS — J189 Pneumonia, unspecified organism: Secondary | ICD-10-CM | POA: Diagnosis present

## 2022-01-09 DIAGNOSIS — E872 Acidosis, unspecified: Secondary | ICD-10-CM | POA: Diagnosis present

## 2022-01-09 DIAGNOSIS — Z20822 Contact with and (suspected) exposure to covid-19: Secondary | ICD-10-CM | POA: Diagnosis present

## 2022-01-09 DIAGNOSIS — Z7901 Long term (current) use of anticoagulants: Secondary | ICD-10-CM

## 2022-01-09 DIAGNOSIS — F32A Depression, unspecified: Secondary | ICD-10-CM | POA: Diagnosis present

## 2022-01-09 DIAGNOSIS — Z79899 Other long term (current) drug therapy: Secondary | ICD-10-CM

## 2022-01-09 DIAGNOSIS — C50919 Malignant neoplasm of unspecified site of unspecified female breast: Secondary | ICD-10-CM | POA: Diagnosis present

## 2022-01-09 DIAGNOSIS — I493 Ventricular premature depolarization: Secondary | ICD-10-CM | POA: Diagnosis present

## 2022-01-09 DIAGNOSIS — Z833 Family history of diabetes mellitus: Secondary | ICD-10-CM

## 2022-01-09 DIAGNOSIS — Z8249 Family history of ischemic heart disease and other diseases of the circulatory system: Secondary | ICD-10-CM

## 2022-01-09 LAB — DIFFERENTIAL
Abs Immature Granulocytes: 0.05 10*3/uL (ref 0.00–0.07)
Basophils Absolute: 0 10*3/uL (ref 0.0–0.1)
Basophils Relative: 0 %
Eosinophils Absolute: 0 10*3/uL (ref 0.0–0.5)
Eosinophils Relative: 0 %
Immature Granulocytes: 0 %
Lymphocytes Relative: 5 %
Lymphs Abs: 0.6 10*3/uL — ABNORMAL LOW (ref 0.7–4.0)
Monocytes Absolute: 0.6 10*3/uL (ref 0.1–1.0)
Monocytes Relative: 5 %
Neutro Abs: 10.3 10*3/uL — ABNORMAL HIGH (ref 1.7–7.7)
Neutrophils Relative %: 90 %

## 2022-01-09 LAB — URINALYSIS, ROUTINE W REFLEX MICROSCOPIC
Bilirubin Urine: NEGATIVE
Glucose, UA: NEGATIVE mg/dL
Hgb urine dipstick: NEGATIVE
Ketones, ur: NEGATIVE mg/dL
Leukocytes,Ua: NEGATIVE
Nitrite: NEGATIVE
Protein, ur: NEGATIVE mg/dL
Specific Gravity, Urine: 1.004 — ABNORMAL LOW (ref 1.005–1.030)
pH: 8 (ref 5.0–8.0)

## 2022-01-09 LAB — RESP PANEL BY RT-PCR (FLU A&B, COVID) ARPGX2
Influenza A by PCR: NEGATIVE
Influenza B by PCR: NEGATIVE
SARS Coronavirus 2 by RT PCR: NEGATIVE

## 2022-01-09 LAB — CBC
HCT: 43 % (ref 36.0–46.0)
Hemoglobin: 13.6 g/dL (ref 12.0–15.0)
MCH: 27.6 pg (ref 26.0–34.0)
MCHC: 31.6 g/dL (ref 30.0–36.0)
MCV: 87.2 fL (ref 80.0–100.0)
Platelets: 282 10*3/uL (ref 150–400)
RBC: 4.93 MIL/uL (ref 3.87–5.11)
RDW: 18.1 % — ABNORMAL HIGH (ref 11.5–15.5)
WBC: 11.8 10*3/uL — ABNORMAL HIGH (ref 4.0–10.5)
nRBC: 0 % (ref 0.0–0.2)

## 2022-01-09 LAB — BASIC METABOLIC PANEL
Anion gap: 11 (ref 5–15)
BUN: 12 mg/dL (ref 8–23)
CO2: 21 mmol/L — ABNORMAL LOW (ref 22–32)
Calcium: 9.3 mg/dL (ref 8.9–10.3)
Chloride: 103 mmol/L (ref 98–111)
Creatinine, Ser: 0.88 mg/dL (ref 0.44–1.00)
GFR, Estimated: >60 mL/min (ref >60)
Glucose, Bld: 111 mg/dL — ABNORMAL HIGH (ref 70–99)
Potassium: 3.5 mmol/L (ref 3.5–5.1)
Sodium: 135 mmol/L (ref 135–145)

## 2022-01-09 LAB — HEPATIC FUNCTION PANEL
ALT: 30 U/L (ref 0–44)
AST: 30 U/L (ref 15–41)
Albumin: 4.2 g/dL (ref 3.5–5.0)
Alkaline Phosphatase: 104 U/L (ref 38–126)
Bilirubin, Direct: 0.2 mg/dL (ref 0.0–0.2)
Indirect Bilirubin: 0.6 mg/dL (ref 0.3–0.9)
Total Bilirubin: 0.8 mg/dL (ref 0.3–1.2)
Total Protein: 7.7 g/dL (ref 6.5–8.1)

## 2022-01-09 LAB — LIPASE, BLOOD: Lipase: 28 U/L (ref 11–51)

## 2022-01-09 LAB — LACTIC ACID, PLASMA
Lactic Acid, Venous: 3.2 mmol/L (ref 0.5–1.9)
Lactic Acid, Venous: 1.4 mmol/L (ref 0.5–1.9)

## 2022-01-09 LAB — BRAIN NATRIURETIC PEPTIDE: B Natriuretic Peptide: 1479.2 pg/mL — ABNORMAL HIGH (ref 0.0–100.0)

## 2022-01-09 LAB — TROPONIN I (HIGH SENSITIVITY): Troponin I (High Sensitivity): 13 ng/L (ref <18)

## 2022-01-09 MED ORDER — PROCHLORPERAZINE EDISYLATE 10 MG/2ML IJ SOLN
10.0000 mg | Freq: Four times a day (QID) | INTRAMUSCULAR | Status: DC | PRN
  Administered 2022-01-09: 10 mg via INTRAVENOUS
  Filled 2022-01-09: qty 2

## 2022-01-09 MED ORDER — APIXABAN 5 MG PO TABS
5.0000 mg | ORAL_TABLET | Freq: Two times a day (BID) | ORAL | Status: DC
  Administered 2022-01-09 – 2022-01-11 (×4): 5 mg via ORAL
  Filled 2022-01-09 (×4): qty 1

## 2022-01-09 MED ORDER — ANASTROZOLE 1 MG PO TABS
1.0000 mg | ORAL_TABLET | Freq: Every evening | ORAL | Status: DC
  Administered 2022-01-09 – 2022-01-10 (×2): 1 mg via ORAL
  Filled 2022-01-09 (×3): qty 1

## 2022-01-09 MED ORDER — SODIUM CHLORIDE 0.9 % IV BOLUS
250.0000 mL | INTRAVENOUS | Status: AC
  Administered 2022-01-09: 250 mL via INTRAVENOUS

## 2022-01-09 MED ORDER — IOHEXOL 350 MG/ML SOLN
100.0000 mL | Freq: Once | INTRAVENOUS | Status: AC | PRN
  Administered 2022-01-09: 100 mL via INTRAVENOUS

## 2022-01-09 MED ORDER — SODIUM CHLORIDE 0.9 % IV SOLN: INTRAVENOUS | Status: DC

## 2022-01-09 MED ORDER — MEXILETINE HCL 150 MG PO CAPS
300.0000 mg | ORAL_CAPSULE | Freq: Two times a day (BID) | ORAL | Status: DC
  Filled 2022-01-09: qty 2

## 2022-01-09 MED ORDER — LACTATED RINGERS IV BOLUS
250.0000 mL | Freq: Once | INTRAVENOUS | Status: AC
  Administered 2022-01-09: 250 mL via INTRAVENOUS

## 2022-01-09 MED ORDER — SODIUM CHLORIDE 0.9 % IV SOLN
2.0000 g | Freq: Once | INTRAVENOUS | Status: AC
  Administered 2022-01-09: 2 g via INTRAVENOUS
  Filled 2022-01-09: qty 12.5

## 2022-01-09 MED ORDER — ACETAMINOPHEN 325 MG PO TABS
650.0000 mg | ORAL_TABLET | Freq: Once | ORAL | Status: AC
  Administered 2022-01-09: 650 mg via ORAL
  Filled 2022-01-09: qty 2

## 2022-01-09 MED ORDER — AZITHROMYCIN 250 MG PO TABS: 500.0000 mg | ORAL_TABLET | Freq: Every day | ORAL | Status: DC

## 2022-01-09 MED ORDER — SODIUM CHLORIDE 0.9 % IV BOLUS
500.0000 mL | Freq: Once | INTRAVENOUS | Status: AC
  Administered 2022-01-09: 500 mL via INTRAVENOUS

## 2022-01-09 MED ORDER — DOXYCYCLINE HYCLATE 100 MG PO TABS
100.0000 mg | ORAL_TABLET | Freq: Two times a day (BID) | ORAL | Status: DC
  Administered 2022-01-09 – 2022-01-11 (×4): 100 mg via ORAL
  Filled 2022-01-09 (×4): qty 1

## 2022-01-09 MED ORDER — GUAIFENESIN ER 600 MG PO TB12
1200.0000 mg | ORAL_TABLET | Freq: Two times a day (BID) | ORAL | Status: DC
  Administered 2022-01-09 – 2022-01-11 (×4): 1200 mg via ORAL
  Filled 2022-01-09 (×4): qty 2

## 2022-01-09 MED ORDER — AMIODARONE HCL 200 MG PO TABS: 200.0000 mg | ORAL_TABLET | Freq: Every day | ORAL | Status: DC

## 2022-01-09 MED ORDER — PANTOPRAZOLE SODIUM 40 MG PO TBEC
40.0000 mg | DELAYED_RELEASE_TABLET | Freq: Every day | ORAL | Status: DC
  Administered 2022-01-09 – 2022-01-11 (×3): 40 mg via ORAL
  Filled 2022-01-09 (×3): qty 1

## 2022-01-09 MED ORDER — ACETAMINOPHEN 500 MG PO TABS
500.0000 mg | ORAL_TABLET | Freq: Four times a day (QID) | ORAL | Status: DC | PRN
  Administered 2022-01-11: 500 mg via ORAL
  Filled 2022-01-09: qty 1

## 2022-01-09 MED ORDER — VANCOMYCIN HCL 1250 MG/250ML IV SOLN
1250.0000 mg | Freq: Once | INTRAVENOUS | Status: AC
Start: 2022-01-09 — End: 2022-01-09
  Administered 2022-01-09: 1250 mg via INTRAVENOUS
  Filled 2022-01-09: qty 250

## 2022-01-09 MED ORDER — POTASSIUM CHLORIDE CRYS ER 20 MEQ PO TBCR
40.0000 meq | EXTENDED_RELEASE_TABLET | Freq: Once | ORAL | Status: AC
  Administered 2022-01-09: 40 meq via ORAL
  Filled 2022-01-09: qty 2

## 2022-01-09 MED ORDER — SODIUM CHLORIDE 0.9 % IV SOLN
1.0000 g | INTRAVENOUS | Status: DC
  Administered 2022-01-10 – 2022-01-11 (×2): 1 g via INTRAVENOUS
  Filled 2022-01-09 (×2): qty 10

## 2022-01-09 NOTE — ED Notes (Signed)
Patient transported to CT 

## 2022-01-09 NOTE — ED Triage Notes (Signed)
Family states pt has CHF and was discharged from hospital 2 weeks ago.  Reports increased SOB, cough, headache, dysuria, and thirst.  Lasix was increased to 3 in the morning and 2 at night.

## 2022-01-09 NOTE — ED Notes (Signed)
BP missed due to patient removing blood pressure cuff, stating it is uncomfortable. Instructed patient and daughter to not remove cuff.

## 2022-01-09 NOTE — H&P (Signed)
History and Physical    Megan Keith RDE:[id number] DOB: Jan 06, 1961 DOA: 01/09/2022  PCP: Earna Coder, NP (Confirm with patient/family/NH records and if not entered, this has to be entered at [facility] point of entry) Patient coming from: Home  I have personally briefly reviewed patient's old medical records in [place]  Chief Complaint: Cough, SOB, fever  HPI: Megan Keith is a 61 y.o. female with medical history significant of chronic systolic CHF LVEF 63% secondary to nonischemic cardiomyopathy (cardiac MRI 12/2021), PAF on Eliquis, frequent SVTs on amiodarone and mexiletine, breast cancer status post radiation therapy on long-term hormone manipulation, presented with increasing shortness of breath cough and fever.  Symptoms started 2 days ago, with new onset of productive cough greenish phlegm, copious amount, subjective fever and episode of chills.  Denies any chest pains, no urinary problems no diarrhea.  At baseline, patient has chronic systolic CHF on Lasix following with cardiology, last week, patient had a weight gain of 2 pounds and cardiology increased Lasix for the last 5 days.  Patient denies any leg swelling at this point.  And her weight has remained stable of 59.1 kg compared to last week.  Granddaughter reported the patient has worsening of generalized weakness and lightheadedness this week, no fall no syncope.  ED Course: Hypotensive, blood pressure 80/60, fever 101.6, borderline tachycardia.  No hypoxia.  CT chest showed right upper lobe pneumonia.  Work WBC 11.8, creatinine 0.88, potassium 3.5  Patient was given vancomycin and cefepime.  Review of Systems: As per HPI otherwise 14 point review of systems negative.    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 ? PVC cardiomyopathy. right and left heart catheterization on 04/15/17, no CAD. Fick output/index 5.28/3.34.   Noncompliance    a. episodic noncompliance.   Pneumonia 06/2016    Past Surgical History:  Procedure Laterality Date   BREAST CYST EXCISION Left    CARDIOVERSION N/A 03/09/2019   Procedure: CARDIOVERSION;  Surgeon: Jolaine Artist, MD;  [facility] ENDOSCOPY;  Service: Cardiovascular;  Laterality: N/A;   CARDIOVERSION N/A 07/05/2019   Procedure: CARDIOVERSION;  Surgeon: Thompson Grayer, MD;  Location: [place] CV LAB;  Service: Cardiovascular;  Laterality: N/A;   CORONARY/GRAFT ANGIOGRAPHY N/A 04/15/2017   Procedure: CORONARY/GRAFT ANGIOGRAPHY;  Surgeon: Nelva Bush, MD;  Location: [place] CV LAB;  Service: Cardiovascular;  Laterality: N/A;   RIGHT HEART CATH N/A 04/15/2017   Procedure: RIGHT HEART CATH;  Surgeon: Nelva Bush, MD;  Location: [place] CV LAB;  Service: Cardiovascular;  Laterality: N/A;   RIGHT HEART CATH N/A 03/12/2021   Procedure: RIGHT HEART CATH;  Surgeon: Jolaine Artist, MD;  Location: [place] CV LAB;  Service: Cardiovascular;  Laterality: N/A;   RIGHT HEART CATH N/A 12/21/2021   Procedure: RIGHT HEART CATH;  Surgeon: Jolaine Artist, MD;  Location: [place] CV LAB;  Service: Cardiovascular;  Laterality: N/A;   TEE WITHOUT  CARDIOVERSION N/A 03/09/2019   Procedure: TRANSESOPHAGEAL ECHOCARDIOGRAM (TEE);  Surgeon: Jolaine Artist, MD;  Location: [facility] ENDOSCOPY;  Service: Cardiovascular;  Laterality: N/A;   TEE WITHOUT CARDIOVERSION N/A 07/05/2019   Procedure: TRANSESOPHAGEAL ECHOCARDIOGRAM (TEE);  Surgeon: Thompson Grayer, MD;  Location: [place] CV LAB;   Service: Cardiovascular;  Laterality: N/A;   TEE WITHOUT CARDIOVERSION N/A 04/21/2021   Procedure: TRANSESOPHAGEAL ECHOCARDIOGRAM (TEE);  Surgeon: Fay Records, MD;  Location: [facility] ENDOSCOPY;  Service: Cardiovascular;  Laterality: N/A;   TUBAL LIGATION       reports that she has never smoked. She has never used smokeless tobacco. She reports that she does not currently use alcohol. She reports that she does not currently use drugs.  No Known Allergies  Family History  Problem Relation Age of Onset   Hypertension Mother    Arthritis Mother    Diabetes Father    Arthritis Sister      Prior to Admission medications   Medication Sig Start Date End Date Taking? Authorizing Provider  acetaminophen (TYLENOL) 500 MG tablet Take 500 mg by mouth every 6 (six) hours as needed for moderate pain or headache.    [provider]  amiodarone (PACERONE) 200 MG tablet TAKE 1 Tablet BY MOUTH ONCE EVERY DAY 01/06/22   Bensimhon, Shaune Pascal, MD  anastrozole (ARIMIDEX) 1 MG tablet Take 1 tablet (1 mg total) by mouth every evening. 12/14/21   Mosher, Vida Roller A, PA-C  apixaban (ELIQUIS) 5 MG TABS tablet Take 1 tablet (5 mg total) by mouth 2 (two) times daily. 10/28/21   Bensimhon, Shaune Pascal, MD  furosemide (LASIX) 20 MG tablet Take 3 tablets (60 mg total) by mouth in the morning AND 2 tablets (40 mg total) every evening. 12/31/21   Lyda Jester M, PA-C  mexiletine (MEXITIL) 150 MG capsule Take 2 capsules (300 mg total) by mouth every 12 (twelve) hours. 12/25/21 01/24/22  Arrien, Jimmy Picket, MD  omeprazole (PRILOSEC) 20 MG capsule Take 20 mg by mouth daily.    [provider]  spironolactone (ALDACTONE) 25 MG tablet Take 1 tablet (25 mg total) by mouth daily. 12/26/21 01/25/22  Tawni Millers, MD    Physical Exam: Vitals:   01/09/22 1740 01/09/22 1741 01/09/22 1742 01/09/22 1743  BP: (!) 86/62 (!) 86/60 (!) 86/62 (!) 85/60  Pulse: 87 87 89 87  Resp: 18 (!) 25 17 (!) 22  Temp:       TempSrc:      SpO2: 99% 100% 100% 98%    Constitutional: NAD, calm, comfortable Vitals:   01/09/22 1740 01/09/22 1741 01/09/22 1742 01/09/22 1743  BP: (!) 86/62 (!) 86/60 (!) 86/62 (!) 85/60  Pulse: 87 87 89 87  Resp: 18 (!) 25 17 (!) 22  Temp:      TempSrc:      SpO2: 99% 100% 100% 98%   Eyes: PERRL, lids and conjunctivae normal ENMT: Mucous membranes are dry. Posterior pharynx clear of any exudate or lesions.Normal dentition.  Neck: normal, supple, no masses, no thyromegaly Respiratory: clear to auscultation bilaterally, no wheezing, coarse crackles on right upper field. Normal respiratory effort. No accessory muscle use.  Cardiovascular: Regular rate and rhythm, no murmurs / rubs / gallops. No extremity edema. 2+ pedal pulses. No carotid bruits.  Abdomen: no tenderness, no masses palpated. No hepatosplenomegaly. Bowel sounds positive.  Musculoskeletal: no clubbing / cyanosis. No joint deformity upper and lower extremities. Good ROM, no contractures. Normal muscle tone.  Skin: no rashes, lesions, ulcers.  No induration Neurologic: CN 2-12 grossly intact. Sensation intact, DTR normal. Strength 5/5 in all 4.  Psychiatric: Normal judgment and insight. Alert and oriented x 3. Normal mood.     Labs on Admission: I have personally reviewed following labs and imaging studies  CBC: Recent Labs  Lab 01/09/22 1420  WBC 11.8*  NEUTROABS 10.3*  HGB 13.6  HCT 43.0  MCV 87.2  PLT 812   Basic Metabolic Panel: Recent Labs  Lab 01/09/22 1420  NA 135  K 3.5  CL 103  CO2 21*  GLUCOSE 111*  BUN 12  CREATININE 0.88  CALCIUM 9.3   GFR: Estimated Creatinine Clearance: 49.6 mL/min (by C-G formula based on SCr of 0.88 mg/dL). Liver Function Tests: Recent Labs  Lab 01/09/22 1550  AST 30  ALT 30  ALKPHOS 104  BILITOT 0.8  PROT 7.7  ALBUMIN 4.2   Recent Labs  Lab 01/09/22 1550  LIPASE 28   No results for input(s): AMMONIA in the last 168 hours. Coagulation Profile: No  results for input(s): INR, PROTIME in the last 168 hours. Cardiac Enzymes: No results for input(s): CKTOTAL, CKMB, CKMBINDEX, TROPONINI in the last 168 hours. BNP (last 3 results) No results for input(s): PROBNP in the last 8760 hours. HbA1C: No results for input(s): HGBA1C in the last 72 hours. CBG: No results for input(s): GLUCAP in the last 168 hours. Lipid Profile: No results for input(s): CHOL, HDL, LDLCALC, TRIG, CHOLHDL, LDLDIRECT in the last 72 hours. Thyroid Function Tests: No results for input(s): TSH, T4TOTAL, FREET4, T3FREE, THYROIDAB in the last 72 hours. Anemia Panel: No results for input(s): VITAMINB12, FOLATE, FERRITIN, TIBC, IRON, RETICCTPCT in the last 72 hours. Urine analysis:    Component Value Date/Time   COLORURINE STRAW (A) 01/09/2022 1507   APPEARANCEUR CLEAR 01/09/2022 1507   LABSPEC 1.004 (L) 01/09/2022 1507   PHURINE 8.0 01/09/2022 1507   GLUCOSEU NEGATIVE 01/09/2022 1507   HGBUR NEGATIVE 01/09/2022 1507   BILIRUBINUR NEGATIVE 01/09/2022 1507   KETONESUR NEGATIVE 01/09/2022 1507   PROTEINUR NEGATIVE 01/09/2022 1507   NITRITE NEGATIVE 01/09/2022 1507   LEUKOCYTESUR NEGATIVE 01/09/2022 1507    Radiological Exams on Admission: DG Chest 2 View  Result Date: 01/09/2022 CLINICAL DATA:  Shortness of breath, chest pain, cough EXAM: CHEST - 2 VIEW COMPARISON:  Previous studies including the examination of 12/17/2021 FINDINGS: Transverse diameter of heart is increased. There is prominence of interstitial markings in the parahilar regions. There is no focal consolidation. There are no signs of alveolar pulmonary edema. There is no pleural effusion or pneumothorax. IMPRESSION: Cardiomegaly. There is prominence of interstitial markings in the parahilar regions suggesting mild interstitial edema or interstitial pneumonia. There is no focal pulmonary consolidation. There is no pleural effusion or pneumothorax. Electronically Signed   By: Elmer Picker M.D.   On:  01/09/2022 15:02   CT Angio Chest PE W and/or Wo Contrast  Result Date: 01/09/2022 CLINICAL DATA:  Dyspnea. Tachycardia. High clinical probability for pulmonary embolism. Abdominal pain. EXAM: CT ANGIOGRAPHY CHEST CT ABDOMEN AND PELVIS WITH CONTRAST TECHNIQUE: Multidetector CT imaging of the chest was performed using the standard protocol during bolus administration of intravenous contrast. Multiplanar CT image reconstructions and MIPs were obtained to evaluate the vascular anatomy. Multidetector CT imaging of the abdomen and pelvis was performed using the standard protocol during bolus administration of intravenous contrast. RADIATION DOSE REDUCTION: This exam was performed according to the departmental dose-optimization program which includes automated exposure control, adjustment of the mA and/or kV  according to patient size and/or use of iterative reconstruction technique. CONTRAST:  139m OMNIPAQUE IOHEXOL 350 MG/ML SOLN COMPARISON:  None Available. FINDINGS: CTA CHEST FINDINGS Cardiovascular: Satisfactory opacification of pulmonary arteries noted, and no pulmonary emboli identified. Moderate cardiomegaly noted. Aortic and coronary atherosclerotic calcification incidentally noted. Mediastinum/Nodes: No masses or pathologically enlarged lymph nodes identified. Lungs/Pleura: Focal airspace disease seen in the right upper lobe, likely due to pneumonia. No evidence of central endobronchial obstruction or pleural effusion. Musculoskeletal: No suspicious bone lesions identified. Review of the MIP images confirms the above findings. CT ABDOMEN and PELVIS FINDINGS Hepatobiliary: No hepatic masses identified. Small benign cyst seen near junction of right and left hepatic lobes. Gallbladder is unremarkable. No evidence of biliary ductal dilatation. Pancreas:  No mass or inflammatory changes. Spleen: Within normal limits in size and appearance. Adrenals/Urinary Tract: No masses identified. No evidence of ureteral  calculi or hydronephrosis. Stomach/Bowel: No evidence of obstruction, inflammatory process or abnormal fluid collections. Vascular/Lymphatic: No pathologically enlarged lymph nodes. No acute vascular findings. Reproductive: Prior hysterectomy noted. Adnexal regions are unremarkable in appearance. Other:  None. Musculoskeletal:  No suspicious bone lesions identified. Review of the MIP images confirms the above findings. IMPRESSION: No evidence of pulmonary embolism. Focal airspace disease in right upper lobe, likely due to pneumonia. No acute findings within the abdomen or pelvis. Aortic Atherosclerosis (ICD10-I70.0). Electronically Signed   By: JMarlaine HindM.D.   On: 01/09/2022 17:14   CT ABDOMEN PELVIS W CONTRAST  Result Date: 01/09/2022 CLINICAL DATA:  Dyspnea. Tachycardia. High clinical probability for pulmonary embolism. Abdominal pain. EXAM: CT ANGIOGRAPHY CHEST CT ABDOMEN AND PELVIS WITH CONTRAST TECHNIQUE: Multidetector CT imaging of the chest was performed using the standard protocol during bolus administration of intravenous contrast. Multiplanar CT image reconstructions and MIPs were obtained to evaluate the vascular anatomy. Multidetector CT imaging of the abdomen and pelvis was performed using the standard protocol during bolus administration of intravenous contrast. RADIATION DOSE REDUCTION: This exam was performed according to the departmental dose-optimization program which includes automated exposure control, adjustment of the mA and/or kV according to patient size and/or use of iterative reconstruction technique. CONTRAST:  1047mOMNIPAQUE IOHEXOL 350 MG/ML SOLN COMPARISON:  None Available. FINDINGS: CTA CHEST FINDINGS Cardiovascular: Satisfactory opacification of pulmonary arteries noted, and no pulmonary emboli identified. Moderate cardiomegaly noted. Aortic and coronary atherosclerotic calcification incidentally noted. Mediastinum/Nodes: No masses or pathologically enlarged lymph nodes  identified. Lungs/Pleura: Focal airspace disease seen in the right upper lobe, likely due to pneumonia. No evidence of central endobronchial obstruction or pleural effusion. Musculoskeletal: No suspicious bone lesions identified. Review of the MIP images confirms the above findings. CT ABDOMEN and PELVIS FINDINGS Hepatobiliary: No hepatic masses identified. Small benign cyst seen near junction of right and left hepatic lobes. Gallbladder is unremarkable. No evidence of biliary ductal dilatation. Pancreas:  No mass or inflammatory changes. Spleen: Within normal limits in size and appearance. Adrenals/Urinary Tract: No masses identified. No evidence of ureteral calculi or hydronephrosis. Stomach/Bowel: No evidence of obstruction, inflammatory process or abnormal fluid collections. Vascular/Lymphatic: No pathologically enlarged lymph nodes. No acute vascular findings. Reproductive: Prior hysterectomy noted. Adnexal regions are unremarkable in appearance. Other:  None. Musculoskeletal:  No suspicious bone lesions identified. Review of the MIP images confirms the above findings. IMPRESSION: No evidence of pulmonary embolism. Focal airspace disease in right upper lobe, likely due to pneumonia. No acute findings within the abdomen or pelvis. Aortic Atherosclerosis (ICD10-I70.0). Electronically Signed   By: JoMarlaine Hind.D.   On:  01/09/2022 17:14    EKG: Independently reviewed.  Sinus, QTc 510.  Assessment/Plan Principal Problem:   Sepsis ([place]) Active Problems:   Hypotension   Chronic systolic CHF (congestive heart failure) (HCC)   CAP (community acquired pneumonia)  (please populate well all problems here in Problem List. (For example, if patient is on BP meds at home and you resume or decide to hold them, it is a problem that needs to be her. Same for CAD, COPD, HLD and so on)  Sepsis -Evidenced by new onset of fever, tachycardia, hypotension and elevated white count, suspected source is right upper lobe  pneumonia. -No significant leukocytosis, no high fever, and no hypoxia, pulmonary hypertension likely multifactorial from infection and suspected overdiuresis, de-escalate antibiotics from HCAP to CAP coverage.  Ceftriaxone plus doxycycline for prolonged QTc.  Sputum culture, atypical study. -Supportive measures, guaifenesin, incentive spirometry and flutter valve.  Hypotension -Multifactorial from sepsis, also there is a element of overdiuresis as patient has symptoms of near syncope and mucous membrane are dry.  Decided to hold Lasix and Aldactone for now. -Did receive 250 mL levels in ED, will give another slow 500 mL IV bolus.  Repeat chest x-ray tomorrow morning. -Other Ddx, TSH normal within 30 days, will not recheck. Send a p.m. cortisol level.  Chronic systolic CHF -Symptoms signs of volume depletion, hold off diuresis -Her body weight has been stable at 59.1 kg as compared to cardiology's office record 10 days ago.  History of frequent SVTs -Replenish K to 4.0, check magnesium level -Continue amiodarone and mexiletine for now  Prolonged Qtc -Replace azithromycin with doxycycline, recheck EKG tomorrow  PAF -In sinus rhythm -Continue Eliquis  History of breast cancer -Continue anastrozole  DVT prophylaxis: Eliquis Code Status: Full code Family Communication: Granddaughter at bedside Disposition Plan: Expected less than 2 midnight hospital stay, once fever subsided likely can be discharged home. Consults called: None Admission status: Tele obs   Lequita Halt MD [facility] Pager [phone]  01/09/2022, 5:55 PM

## 2022-01-09 NOTE — ED Notes (Signed)
Notified Dr Marcello Moores of pt being hypotensive, obtained orders

## 2022-01-09 NOTE — Telephone Encounter (Signed)
Patient's granddaughter called in with concerns. Reports patient has had an ongoing headache since discharge, just has not felt well. Also with non-productive cough and unable to sleep. Has been complaint with home medications, but actually stopped her PM dose of lasix (last dose Thursday evening) as she was having some difficult urinating. Weights have been stable around  127-130lbs, says urine output as been ok last evening and this morning. Also concerned at HR has been in the 110 range. Does have hx of PAF/atrial tachycardia but HR was 104 at last office visit in sinus. I advised she should resume her lasix of '60mg'$  qam/'40mg'$  qpm in order to avoid volume overload. BPs are stable in the 638T systolic. Granddaughter stated the patient appears comfortable, breathing is stable. Asking for office visit first of next week. I advised I will send a message to request appt in [facility] clinic, but if she notes decreased urine output, worsening dyspnea or worsening HR needs to be seen in the ED. She voiced understanding and thanked me for callback.

## 2022-01-09 NOTE — ED Provider Notes (Signed)
[facility] EMERGENCY DEPARTMENT Provider Note   CSN: [id number] Arrival date & time: 01/09/22  1344     History  No chief complaint on file.   Megan Keith is a 61 y.o. female with history of HFrEF (last EF about 20%), GERD, atrial fibrillation on Eliquis, hyperthyroidism presenting to the ED with multiple complaints.  History is obtained from the patient utilizing her daughter as a Optometrist (which they requested).  Daughter states that patient has been feeling unwell since her discharge from the hospital on 5/5 with some intermittent headaches and fatigue.  However, her she acutely worsened this past Tuesday and since then has been having headaches, productive cough (clear sputum), dysuria, nausea, and shortness of breath.  Patient was recently admitted for CHF exacerbation and at that time had her Lasix dose increased.  She has not noted any weight gain since her discharge.  Daughter is concerned that she may be dehydrated.  Currently in the ED, she endorses shortness of breath both at rest and with ambulation.  She also endorses burning chest pain over her anterior chest and into her back as well as burning pain in her abdomen.  She has had a small amount of diarrhea but no vomiting.  HPI     Home Medications Prior to Admission medications   Medication Sig Start Date End Date Taking? Authorizing Provider  acetaminophen (TYLENOL) 500 MG tablet Take 500 mg by mouth every 6 (six) hours as needed for moderate pain or headache.    [provider]  amiodarone (PACERONE) 200 MG tablet TAKE 1 Tablet BY MOUTH ONCE EVERY DAY 01/06/22   Bensimhon, Shaune Pascal, MD  anastrozole (ARIMIDEX) 1 MG tablet Take 1 tablet (1 mg total) by mouth every evening. 12/14/21   Mosher, Vida Roller A, PA-C  apixaban (ELIQUIS) 5 MG TABS tablet Take 1 tablet (5 mg total) by mouth 2 (two) times daily. 10/28/21   Bensimhon, Shaune Pascal, MD  furosemide (LASIX) 20 MG tablet Take 3 tablets (60 mg  total) by mouth in the morning AND 2 tablets (40 mg total) every evening. 12/31/21   Lyda Jester M, PA-C  mexiletine (MEXITIL) 150 MG capsule Take 2 capsules (300 mg total) by mouth every 12 (twelve) hours. 12/25/21 01/24/22  Arrien, Jimmy Picket, MD  omeprazole (PRILOSEC) 20 MG capsule Take 20 mg by mouth daily.    [provider]  spironolactone (ALDACTONE) 25 MG tablet Take 1 tablet (25 mg total) by mouth daily. 12/26/21 01/25/22  Arrien, Jimmy Picket, MD      Allergies    Patient has no known allergies.    Review of Systems   Review of Systems  Constitutional:  Negative for fever.  Respiratory:  Positive for cough and shortness of breath.   Cardiovascular:  Positive for chest pain (burning). Negative for leg swelling.  Gastrointestinal:  Positive for abdominal pain, diarrhea and nausea. Negative for vomiting.  Genitourinary:  Positive for dysuria.  Neurological:  Positive for headaches. Negative for syncope.   Physical Exam Updated Vital Signs BP 97/71   Pulse 89   Temp (!) 101.6 F (38.7 C) (Rectal)   Resp (!) 27   SpO2 95%  Physical Exam Vitals and nursing note reviewed.  Constitutional:      Appearance: She is ill-appearing. She is not toxic-appearing or diaphoretic.     Comments: Chronically ill appearing.  HENT:     Head: Normocephalic and atraumatic.     Nose: Nose normal.  Mouth/Throat:     Mouth: Mucous membranes are dry.     Pharynx: Oropharynx is clear.  Eyes:     General: No scleral icterus. Cardiovascular:     Rate and Rhythm: Regular rhythm. Tachycardia present.     Pulses: Normal pulses.     Heart sounds: Normal heart sounds. No murmur heard.   No friction rub. No gallop.  Pulmonary:     Breath sounds: No stridor. No wheezing, rhonchi or rales.     Comments: Mildly tachypnic. Abdominal:     General: There is no distension.     Palpations: Abdomen is soft.     Tenderness: There is abdominal tenderness (diffuse). There is guarding  (suprapubic). There is no rebound.  Musculoskeletal:        General: No deformity.     Cervical back: Neck supple.     Right lower leg: No edema.     Left lower leg: No edema.  Skin:    General: Skin is warm and dry.  Neurological:     General: No focal deficit present.     Mental Status: She is alert and oriented to person, place, and time.    ED Results / Procedures / Treatments   Labs (all labs ordered are listed, but only abnormal results are displayed) Labs Reviewed  BASIC METABOLIC PANEL - Abnormal; Notable for the following components:      Result Value   CO2 21 (*)    Glucose, Bld 111 (*)    All other components within normal limits  CBC - Abnormal; Notable for the following components:   WBC 11.8 (*)    RDW 18.1 (*)    All other components within normal limits  BRAIN NATRIURETIC PEPTIDE - Abnormal; Notable for the following components:   B Natriuretic Peptide 1,479.2 (*)    All other components within normal limits  URINALYSIS, ROUTINE W REFLEX MICROSCOPIC - Abnormal; Notable for the following components:   Color, Urine STRAW (*)    Specific Gravity, Urine 1.004 (*)    All other components within normal limits  DIFFERENTIAL - Abnormal; Notable for the following components:   Neutro Abs 10.3 (*)    Lymphs Abs 0.6 (*)    All other components within normal limits  RESP PANEL BY RT-PCR (FLU A&B, COVID) ARPGX2  CULTURE, BLOOD (ROUTINE X 2)  CULTURE, BLOOD (ROUTINE X 2)  HEPATIC FUNCTION PANEL  LIPASE, BLOOD  LACTIC ACID, PLASMA  LACTIC ACID, PLASMA  TROPONIN I (HIGH SENSITIVITY)  TROPONIN I (HIGH SENSITIVITY)    EKG None  Radiology DG Chest 2 View  Result Date: 01/09/2022 CLINICAL DATA:  Shortness of breath, chest pain, cough EXAM: CHEST - 2 VIEW COMPARISON:  Previous studies including the examination of 12/17/2021 FINDINGS: Transverse diameter of heart is increased. There is prominence of interstitial markings in the parahilar regions. There is no focal  consolidation. There are no signs of alveolar pulmonary edema. There is no pleural effusion or pneumothorax. IMPRESSION: Cardiomegaly. There is prominence of interstitial markings in the parahilar regions suggesting mild interstitial edema or interstitial pneumonia. There is no focal pulmonary consolidation. There is no pleural effusion or pneumothorax. Electronically Signed   By: Elmer Picker M.D.   On: 01/09/2022 15:02   CT Angio Chest PE W and/or Wo Contrast  Result Date: 01/09/2022 CLINICAL DATA:  Dyspnea. Tachycardia. High clinical probability for pulmonary embolism. Abdominal pain. EXAM: CT ANGIOGRAPHY CHEST CT ABDOMEN AND PELVIS WITH CONTRAST TECHNIQUE: Multidetector CT imaging of the chest  was performed using the standard protocol during bolus administration of intravenous contrast. Multiplanar CT image reconstructions and MIPs were obtained to evaluate the vascular anatomy. Multidetector CT imaging of the abdomen and pelvis was performed using the standard protocol during bolus administration of intravenous contrast. RADIATION DOSE REDUCTION: This exam was performed according to the departmental dose-optimization program which includes automated exposure control, adjustment of the mA and/or kV according to patient size and/or use of iterative reconstruction technique. CONTRAST:  134m OMNIPAQUE IOHEXOL 350 MG/ML SOLN COMPARISON:  None Available. FINDINGS: CTA CHEST FINDINGS Cardiovascular: Satisfactory opacification of pulmonary arteries noted, and no pulmonary emboli identified. Moderate cardiomegaly noted. Aortic and coronary atherosclerotic calcification incidentally noted. Mediastinum/Nodes: No masses or pathologically enlarged lymph nodes identified. Lungs/Pleura: Focal airspace disease seen in the right upper lobe, likely due to pneumonia. No evidence of central endobronchial obstruction or pleural effusion. Musculoskeletal: No suspicious bone lesions identified. Review of the MIP images  confirms the above findings. CT ABDOMEN and PELVIS FINDINGS Hepatobiliary: No hepatic masses identified. Small benign cyst seen near junction of right and left hepatic lobes. Gallbladder is unremarkable. No evidence of biliary ductal dilatation. Pancreas:  No mass or inflammatory changes. Spleen: Within normal limits in size and appearance. Adrenals/Urinary Tract: No masses identified. No evidence of ureteral calculi or hydronephrosis. Stomach/Bowel: No evidence of obstruction, inflammatory process or abnormal fluid collections. Vascular/Lymphatic: No pathologically enlarged lymph nodes. No acute vascular findings. Reproductive: Prior hysterectomy noted. Adnexal regions are unremarkable in appearance. Other:  None. Musculoskeletal:  No suspicious bone lesions identified. Review of the MIP images confirms the above findings. IMPRESSION: No evidence of pulmonary embolism. Focal airspace disease in right upper lobe, likely due to pneumonia. No acute findings within the abdomen or pelvis. Aortic Atherosclerosis (ICD10-I70.0). Electronically Signed   By: JMarlaine HindM.D.   On: 01/09/2022 17:14   CT ABDOMEN PELVIS W CONTRAST  Result Date: 01/09/2022 CLINICAL DATA:  Dyspnea. Tachycardia. High clinical probability for pulmonary embolism. Abdominal pain. EXAM: CT ANGIOGRAPHY CHEST CT ABDOMEN AND PELVIS WITH CONTRAST TECHNIQUE: Multidetector CT imaging of the chest was performed using the standard protocol during bolus administration of intravenous contrast. Multiplanar CT image reconstructions and MIPs were obtained to evaluate the vascular anatomy. Multidetector CT imaging of the abdomen and pelvis was performed using the standard protocol during bolus administration of intravenous contrast. RADIATION DOSE REDUCTION: This exam was performed according to the departmental dose-optimization program which includes automated exposure control, adjustment of the mA and/or kV according to patient size and/or use of iterative  reconstruction technique. CONTRAST:  1023mOMNIPAQUE IOHEXOL 350 MG/ML SOLN COMPARISON:  None Available. FINDINGS: CTA CHEST FINDINGS Cardiovascular: Satisfactory opacification of pulmonary arteries noted, and no pulmonary emboli identified. Moderate cardiomegaly noted. Aortic and coronary atherosclerotic calcification incidentally noted. Mediastinum/Nodes: No masses or pathologically enlarged lymph nodes identified. Lungs/Pleura: Focal airspace disease seen in the right upper lobe, likely due to pneumonia. No evidence of central endobronchial obstruction or pleural effusion. Musculoskeletal: No suspicious bone lesions identified. Review of the MIP images confirms the above findings. CT ABDOMEN and PELVIS FINDINGS Hepatobiliary: No hepatic masses identified. Small benign cyst seen near junction of right and left hepatic lobes. Gallbladder is unremarkable. No evidence of biliary ductal dilatation. Pancreas:  No mass or inflammatory changes. Spleen: Within normal limits in size and appearance. Adrenals/Urinary Tract: No masses identified. No evidence of ureteral calculi or hydronephrosis. Stomach/Bowel: No evidence of obstruction, inflammatory process or abnormal fluid collections. Vascular/Lymphatic: No pathologically enlarged lymph nodes. No acute vascular findings.  Reproductive: Prior hysterectomy noted. Adnexal regions are unremarkable in appearance. Other:  None. Musculoskeletal:  No suspicious bone lesions identified. Review of the MIP images confirms the above findings. IMPRESSION: No evidence of pulmonary embolism. Focal airspace disease in right upper lobe, likely due to pneumonia. No acute findings within the abdomen or pelvis. Aortic Atherosclerosis (ICD10-I70.0). Electronically Signed   By: Marlaine Hind M.D.   On: 01/09/2022 17:14    Procedures Procedures   Medications Ordered in ED Medications  vancomycin (VANCOREADY) IVPB 1250 mg/250 mL (1,250 mg Intravenous New Bag/Given 01/09/22 1708)   acetaminophen (TYLENOL) tablet 650 mg (650 mg Oral Given 01/09/22 1610)  lactated ringers bolus 250 mL (0 mLs Intravenous Stopped 01/09/22 1720)  ceFEPIme (MAXIPIME) 2 g in sodium chloride 0.9 % 100 mL IVPB (0 g Intravenous Stopped 01/09/22 1738)  iohexol (OMNIPAQUE) 350 MG/ML injection 100 mL (100 mLs Intravenous Contrast Given 01/09/22 1655)    ED Course/ Medical Decision Making/ A&P                           Medical Decision Making Amount and/or Complexity of Data Reviewed Labs: ordered. Radiology: ordered.  Risk OTC drugs. Prescription drug management. Decision regarding hospitalization.   Megan Keith is a 61 y.o. female with history of HFrEF (last EF about 20%), GERD, atrial fibrillation on Eliquis, hyperthyroidism presenting to the ED with shortness of breath, cough, headache, dysuria, nausea, and burning chest pain and abdominal pain.  On exam, the patient is mildly tachycardic into the 100s and intermittently tachypneic into the upper 20s and lower 30s.  Systolic blood pressures are in the 90s which appears to be slightly lower than her normal.  Rectal temperature is 101.  She has diffuse tenderness over her abdomen but appears most focally tender over the suprapubic region.  Lungs are clear to auscultation without crackles.  She has no lower extremity edema.  Mucous membranes do appear dry.  Patient does not appear to be fluid up on exam given no crackles over her lungs and no lower extremity edema.  She does have dry mucous membranes and her Lasix dosage was recently increased so she may be slightly over diuresed.  We will administer 250 cc IV fluid bolus.  CBC is notable for a leukocytosis to 11.8.  Troponin is normal.  Her BNP is elevated to the 1400s, though patient does not appear fluid up on exam.  Lipase is normal.  COVID and flu testing is negative.  UA is not concerning for infection.  BMP notable for a bicarb of 21.  CT of the chest does show new opacities in  the right upper lobe concerning for pneumonia.  No pulmonary embolism.  CT abdomen/pelvis is negative for acute abnormality.  In the setting of patient's leukocytosis, fever, borderline hypotension, tachycardia, code sepsis was initiated.  She was initiated on vancomycin and cefepime given her recent admission to the hospital for coverage of HCAP.  Blood cultures obtained.  Hospitalist was contacted for admission the patient was admitted to their service in stable condition.  Final Clinical Impression(s) / ED Diagnoses Final diagnoses:  Pneumonia of right upper lobe due to infectious organism    Rx / DC Orders ED Discharge Orders     None         Sondra Come, MD 01/09/22 1755    Sherwood Gambler, MD 01/09/22 1807

## 2022-01-09 NOTE — ED Notes (Signed)
Assisted to restroom twice for bm.  Tolerated ambulating without difficulty.

## 2022-01-09 NOTE — ED Notes (Signed)
Orthostatics not done due to patient already hypostensive.

## 2022-01-09 NOTE — ED Notes (Signed)
Messaged Dr Pearletha Forge earlier in reference to low bp's.  He states as long as she remains asymptomatic to hold off on further IV fluids.

## 2022-01-10 ENCOUNTER — Inpatient Hospital Stay: Payer: Self-pay

## 2022-01-10 DIAGNOSIS — I5042 Chronic combined systolic (congestive) and diastolic (congestive) heart failure: Secondary | ICD-10-CM

## 2022-01-10 DIAGNOSIS — R6520 Severe sepsis without septic shock: Secondary | ICD-10-CM

## 2022-01-10 DIAGNOSIS — I480 Paroxysmal atrial fibrillation: Secondary | ICD-10-CM

## 2022-01-10 DIAGNOSIS — C50511 Malignant neoplasm of lower-outer quadrant of right female breast: Secondary | ICD-10-CM

## 2022-01-10 DIAGNOSIS — J159 Unspecified bacterial pneumonia: Secondary | ICD-10-CM

## 2022-01-10 DIAGNOSIS — D649 Anemia, unspecified: Secondary | ICD-10-CM

## 2022-01-10 DIAGNOSIS — I272 Pulmonary hypertension, unspecified: Secondary | ICD-10-CM

## 2022-01-10 DIAGNOSIS — F32A Depression, unspecified: Secondary | ICD-10-CM

## 2022-01-10 DIAGNOSIS — Z170 Estrogen receptor positive status [ER+]: Secondary | ICD-10-CM

## 2022-01-10 LAB — MRSA NEXT GEN BY PCR, NASAL: MRSA by PCR Next Gen: NOT DETECTED

## 2022-01-10 LAB — CBC
HCT: 35.7 % — ABNORMAL LOW (ref 36.0–46.0)
Hemoglobin: 11.7 g/dL — ABNORMAL LOW (ref 12.0–15.0)
MCH: 28.3 pg (ref 26.0–34.0)
MCHC: 32.8 g/dL (ref 30.0–36.0)
MCV: 86.4 fL (ref 80.0–100.0)
Platelets: 231 10*3/uL (ref 150–400)
RBC: 4.13 MIL/uL (ref 3.87–5.11)
RDW: 18.4 % — ABNORMAL HIGH (ref 11.5–15.5)
WBC: 9.1 10*3/uL (ref 4.0–10.5)
nRBC: 0 % (ref 0.0–0.2)

## 2022-01-10 LAB — LACTIC ACID, PLASMA
Lactic Acid, Venous: 1.1 mmol/L (ref 0.5–1.9)
Lactic Acid, Venous: 1 mmol/L (ref 0.5–1.9)

## 2022-01-10 LAB — COOXEMETRY PANEL
Carboxyhemoglobin: 1.4 % (ref 0.5–1.5)
Methemoglobin: <0.7 % (ref 0.0–1.5)
O2 Saturation: 59.5 %
Total hemoglobin: 11.5 g/dL — ABNORMAL LOW (ref 12.0–16.0)

## 2022-01-10 LAB — TROPONIN I (HIGH SENSITIVITY): Troponin I (High Sensitivity): 12 ng/L (ref <18)

## 2022-01-10 LAB — MAGNESIUM: Magnesium: 2.1 mg/dL (ref 1.7–2.4)

## 2022-01-10 LAB — BRAIN NATRIURETIC PEPTIDE: B Natriuretic Peptide: 1579.1 pg/mL — ABNORMAL HIGH (ref 0.0–100.0)

## 2022-01-10 LAB — CORTISOL-PM, BLOOD: Cortisol - PM: 9.6 ug/dL (ref <10.0)

## 2022-01-10 LAB — PROCALCITONIN: Procalcitonin: <0.1 ng/mL

## 2022-01-10 LAB — STREP PNEUMONIAE URINARY ANTIGEN: Strep Pneumo Urinary Antigen: NEGATIVE

## 2022-01-10 LAB — LACTATE DEHYDROGENASE: LDH: 170 U/L (ref 98–192)

## 2022-01-10 MED ORDER — MIDODRINE HCL 5 MG PO TABS
10.0000 mg | ORAL_TABLET | ORAL | Status: AC
  Administered 2022-01-10: 10 mg via ORAL
  Filled 2022-01-10: qty 2

## 2022-01-10 MED ORDER — AMIODARONE HCL 200 MG PO TABS
200.0000 mg | ORAL_TABLET | Freq: Every day | ORAL | Status: DC
  Administered 2022-01-11: 200 mg via ORAL
  Filled 2022-01-10: qty 1

## 2022-01-10 MED ORDER — PHENOL 1.4 % MT LIQD
1.0000 | OROMUCOSAL | Status: DC | PRN
  Administered 2022-01-10: 1 via OROMUCOSAL
  Filled 2022-01-10 (×2): qty 177

## 2022-01-10 MED ORDER — SODIUM CHLORIDE 0.9% FLUSH
10.0000 mL | Freq: Two times a day (BID) | INTRAVENOUS | Status: DC
  Administered 2022-01-10: 10 mL
  Administered 2022-01-10: 20 mL
  Administered 2022-01-11: 10 mL

## 2022-01-10 MED ORDER — SODIUM CHLORIDE 0.9 % IV BOLUS
500.0000 mL | INTRAVENOUS | Status: AC
  Administered 2022-01-10: 500 mL via INTRAVENOUS

## 2022-01-10 MED ORDER — SODIUM CHLORIDE 0.9 % IV BOLUS
500.0000 mL | Freq: Once | INTRAVENOUS | Status: AC
  Administered 2022-01-10: 500 mL via INTRAVENOUS

## 2022-01-10 MED ORDER — LACTATED RINGERS IV BOLUS
500.0000 mL | Freq: Once | INTRAVENOUS | Status: AC
  Administered 2022-01-10: 500 mL via INTRAVENOUS

## 2022-01-10 MED ORDER — MEXILETINE HCL 150 MG PO CAPS
300.0000 mg | ORAL_CAPSULE | Freq: Two times a day (BID) | ORAL | Status: DC
  Administered 2022-01-11: 300 mg via ORAL
  Filled 2022-01-10: qty 2

## 2022-01-10 MED ORDER — CHLORHEXIDINE GLUCONATE CLOTH 2 % EX PADS
6.0000 | MEDICATED_PAD | Freq: Every day | CUTANEOUS | Status: DC
  Administered 2022-01-10 – 2022-01-11 (×2): 6 via TOPICAL

## 2022-01-10 MED ORDER — SODIUM CHLORIDE 0.9% FLUSH: 10.0000 mL | INTRAVENOUS | Status: DC | PRN

## 2022-01-10 NOTE — Assessment & Plan Note (Signed)
Baseline BP limits GDMT - Hold furosemide, spiro

## 2022-01-10 NOTE — Progress Notes (Signed)
Notified Dr. Nevada Crane that patient is still hypotensive after Midodrine given, pt remains asymptomatic at this time. Will continue to monitor.

## 2022-01-10 NOTE — Assessment & Plan Note (Signed)
She has tachypnea, fever, and progressive hypotension to the 70s (her baseline is around 100s-110s, some 90s).  Her lactate cleared and she doesn't look "shocky" but has had a steady drop in BP despite fluids and midodrine now, and I do not think this hypotension is just within her normal range while resting.  - Continue antibiotics - Bolus 500 cc now, to complete 30cc/kg  - Consult CCM - Consult Adv HF - I discussed with CCM who will transfer to ICU so she can have close hemodynamic monitoring her response to this bolus and consider PICC for pressors/coox/inotroptes

## 2022-01-10 NOTE — Assessment & Plan Note (Signed)
-   Hold diuretics - Consult Adv HF

## 2022-01-10 NOTE — Hospital Course (Signed)
Mrs. [name] is a 61 y.o. F spanish-speaking, hx NICM and sdCHF EF 20%, recently admitted for CHF 2 weeks ago and diuresed, also hyperthyroidism from amio, no longer on Tapazole, pAF on Eliquis, pHTN, BrCA and cervical CA who presnted with fever, cough, SOB, sputum.  In the ER, CT chest ruled out PE, showed bilateral lower lobe pneumonia.    5/20: Admitted on antibiotics 5/21: Progressively hypotensive overnight, despite fluids, CCM consulted and transferred to ICU

## 2022-01-10 NOTE — Progress Notes (Signed)
  Progress Note   Patient: Megan Keith BWG:[id number] DOB: 1960-12-07 DOA: 01/09/2022     1 DOS: the patient was seen and examined on 01/10/2022 at 7:25AM      Brief hospital course: Mrs. [name] is a 61 y.o. F spanish-speaking, hx NICM and sdCHF EF 20%, recently admitted for CHF 2 weeks ago and diuresed, also hyperthyroidism from amio, no longer on Tapazole, pAF on Eliquis, pHTN, BrCA and cervical CA who presnted with fever, cough, SOB, sputum.  In the ER, CT chest ruled out PE, showed bilateral lower lobe pneumonia.    5/20: Admitted on antibiotics 5/21: Progressively hypotensive overnight, despite fluids, CCM consulted and transferred to ICU     Assessment and Plan: * Severe sepsis ([place]) She has tachypnea, fever, and progressive hypotension to the 70s (her baseline is around 100s-110s, some 90s).  Her lactate cleared and she doesn't look "shocky" but has had a steady drop in BP despite fluids and midodrine now, and I do not think this hypotension is just within her normal range while resting.  - Continue antibiotics - Bolus 500 cc now, to complete 30cc/kg  - Consult CCM - Consult Adv HF - I discussed with CCM who will transfer to ICU so she can have close hemodynamic monitoring her response to this bolus and consider PICC for pressors/coox/inotroptes  Paroxysmal atrial fibrillation ([facility]) - Continue amiodarone, mexiletine - Continue apixaban  Depression Not on treatment  Pulmonary HTN ([place]) - Hold diuretics - Consult Adv HF  CAP (community acquired pneumonia) See above  Chronic combined systolic and diastolic CHF (congestive heart failure) ([facility]) Baseline BP limits GDMT - Hold furosemide, spiro  Breast cancer ([place]) - Continue anaxtrozole          Subjective: Sleepy.  Denies malaise, confusion, pain.     Physical Exam: Vitals:   01/10/22 0524 01/10/22 0610 01/10/22 0706 01/10/22 0720  BP: (!) 84/77  (!) 72/51 (!) 70/51  Pulse: 87  76 80   Resp:  (!) 32  20  Temp:    98.6 F (37 C)  TempSrc:    Oral  SpO2:      Weight:  59.2 kg    Height:       Elderly female, lying in bed, sleeping.  Sluggish to arouse and falls back asleep. RRR no murmurs, no JVD, no peripheral edema.  Respiratory rate increased and shallow.  No rales or wheezing.   Abdomen soft without tenderness in all quadrants, no guarding, no ascites. Oriented to self, "hospital", year.  Moves upper extremities with normal coodrination.  OP moist.     Data Reviewed: Discussed with Cardiology, CCM.  Nursing notes reviewed, vital signs reviewed, prior notes reviewed in Epic. BMP and CBC reviewed, Cr normal, WBC slightly up, no anemia.   Lactates normal BNP elevated AST/ALT normal Cortisol indeterminate      Disposition: Status is: Inpatient         Author: Edwin Dada, MD 01/10/2022 8:02 AM  For on call review [email or web address].

## 2022-01-10 NOTE — Assessment & Plan Note (Signed)
Not on treatment

## 2022-01-10 NOTE — Plan of Care (Signed)
Patient remains Hypotensive, Midodrine ordered and given. Patient remains asymptomatic at this time. Cardiology consult placed.    Problem: Education: Goal: Knowledge of disease or condition will improve Outcome: Progressing Goal: Knowledge of the prescribed therapeutic regimen will improve Outcome: Not Progressing   Problem: Activity: Goal: Ability to tolerate increased activity will improve Outcome: Progressing Goal: Will verbalize the importance of balancing activity with adequate rest periods Outcome: Progressing   Problem: Respiratory: Goal: Ability to maintain a clear airway will improve Outcome: Progressing   Problem: Respiratory: Goal: Ability to maintain a clear airway will improve Outcome: Progressing Goal: Levels of oxygenation will improve Outcome: Progressing Goal: Ability to maintain adequate ventilation will improve Outcome: Progressing    Problem: Clinical Measurements: Goal: Ability to maintain a body temperature in the normal range will improve Outcome: Progressing   Problem: Respiratory: Goal: Ability to maintain adequate ventilation will improve Outcome: Progressing Goal: Ability to maintain a clear airway will improve Outcome: Progressing   Problem: Respiratory: Goal: Ability to maintain a clear airway will improve Outcome: Progressing

## 2022-01-10 NOTE — Progress Notes (Signed)
Open chat with Dr. Nevada Crane that patient continues to be hypotensive.  Dr. Loleta Books paged and verbally told him about patient and concern for her BP.  Dr. Loleta Books will come see patient . Patient remains alert and oriented.

## 2022-01-10 NOTE — Progress Notes (Signed)
Continues to have very low BP, but is alert, coherent and appears comfortable. Lying fully supine in bed without dyspnea. Procalcitonin is low.  Normal renal parameters and rapid normalization of lactic acid. Just received a PICC line. CVP may not be very useful if high (she has moderate to severe TR), but if low  it would mean that she remains hypervolemic. Will also check a Coox sat. If high, that would confirm this is primarily septic shock and she would benefit from a vasoconstrictor. I agree that norepi will be the best choice with her low EF.

## 2022-01-10 NOTE — Assessment & Plan Note (Signed)
-   Continue amiodarone, mexiletine - Continue apixaban

## 2022-01-10 NOTE — Progress Notes (Signed)
Received patient from ED, pt is hypotensive at this time and receiving a 500cc fluid bolus. Patient has tank top, pants and underwear in personal belonging bag. Red cooler containing patients home medications was discovered. Meds charted on home med list and sealed with another RN Leana Roe) to be given to Pharmacy, no family at bedside.  Please refer to Epic for full documentation. Patient oriented to room, call bell within reach and bed in lowest position.  Patient is  Spanish speaking.

## 2022-01-10 NOTE — Assessment & Plan Note (Signed)
See above

## 2022-01-10 NOTE — Consult Note (Addendum)
NAME:  Megan Keith, MRN:  [id number], DOB:  1961/08/05, LOS: 1 ADMISSION DATE:  01/09/2022, CONSULTATION DATE:  01/10/22 REFERRING MD:  Loleta Books - TRH, CHIEF COMPLAINT:  hypotension   History of Present Illness:   61 yo F PMH NICM EF 20% with baseline SBPs about 100, AF on eliquis,   admitted to [facility] 5/20 with cough SOB fever, and started tx for CAP. Was recently admitted for acute systolic HF requiring aggressive diuresis.  This admission, pt SBPs have been down-trending. She transiently responded to fluid boluses, and did have improvement in her LA-- was seen by cards early this morning at that time and felt to be septic shock with resolved shock after fluids.   Has since started down-trending again. At time of PCCM consult SBPs are 70s-80s, with adequate MAPs. She reports increasing lethargy and fatigue but feels this is from poor night's sleep.    After last admission, dw 126lb. At time of consultation wt 130  Pertinent  Medical History  NICM Chronic hypotension  pAF Chronic anticoagulation   Significant Hospital Events: Including procedures, antibiotic start and stop dates in addition to other pertinent events   5/20 admit to [facility] for PNA.  5/21 downtrending SBPs. Seen by cards overnight after fluid boluses given, with improvement in LA felt ok to stay on SDU. PCCM consult for subsequent downtrend again   Interim History / Subjective:  SBPs 70s  Objective   Blood pressure (!) 70/51, pulse 80, temperature 98.6 F (37 C), temperature source Oral, resp. rate 20, height '4\' 9"'$  (1.448 m), weight 59.2 kg, SpO2 97 %.        Intake/Output Summary (Last 24 hours) at 01/10/2022 0849 Last data filed at 01/10/2022 0410 Gross per 24 hour  Intake 1903.22 ml  Output 100 ml  Net 1803.22 ml   Filed Weights   01/10/22 0610  Weight: 59.2 kg    Examination: General: chronically ill appearing middle aged F NAD HENT: NCAT pink mmm Lungs: Incr RR. Symmetrical chest expansion, no  crackles Cardiovascular: rr. S1s2 cap refill < 3 sec  Abdomen: soft ndnt + bowel sounds  Extremities: no acute joint deformity. No pitting edema  Neuro: Lethargic. Awakens to voice, drifts to sleep easily during conversation. Following commands  GU: defer  Resolved Hospital Problem list     Assessment & Plan:   Acute on chronic hypotension -- question of developing septic shock.  Severe sepsis vs septic shock due to RUL CAP -Does not clinically looks like pure cardiogenic shock at this point, and doubt hypovolemic -- but could have mixed picture.  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 -baseline EF < 20% -intolerant of ARB/ARNi due to chronic hypotension -5/20 BNP 1500, up from 830 5/11 P -adv HF to see -order placed for PICC  -- coox cvp after placement -would defer further IV fluid resusc as above  -  hold diuresis for now   Hx Afib on Eliquis P -amio, mexilitine, eliquis   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 - arimodex  Best Practice (right click and "Reselect all SmartList Selections" daily)   Diet/type: Regular consistency (see orders) DVT prophylaxis: DOAC GI prophylaxis: N/A Lines: N/A -- order placed for PICC 5/21 Foley:  N/A Code Status:  full code Last date of multidisciplinary goals of care discussion [--]  Labs   CBC: Recent Labs  Lab  01/09/22 1420 01/10/22 0519  WBC 11.8* 9.1  NEUTROABS 10.3*  --   HGB 13.6 11.7*  HCT 43.0 35.7*  MCV 87.2 86.4  PLT 282 630    Basic Metabolic Panel: Recent Labs  Lab 01/09/22 1420 01/10/22 0519  NA 135  --   K 3.5  --   CL 103  --   CO2 21*  --   GLUCOSE 111*  --   BUN 12  --   CREATININE 0.88  --   CALCIUM 9.3  --   MG  --  2.1   GFR: Estimated Creatinine Clearance: 49.6 mL/min (by C-G formula based on SCr of 0.88 mg/dL). Recent Labs  Lab 01/09/22 1420 01/09/22 1650 01/09/22 1915 01/10/22 0519 01/10/22 0753  PROCALCITON  --   --   --  <0.10  --   WBC 11.8*  --   --  9.1  --   LATICACIDVEN  --  3.2* 1.4 1.1 1.0    Liver Function Tests: Recent Labs  Lab 01/09/22 1550  AST 30  ALT 30  ALKPHOS 104  BILITOT 0.8  PROT 7.7  ALBUMIN 4.2   Recent Labs  Lab 01/09/22 1550  LIPASE 28   No results for input(s): AMMONIA in the last 168 hours.  ABG    Component Value Date/Time   PHART 7.474 (H) 12/21/2021 1503   PCO2ART 39.2 12/21/2021 1503   PO2ART 38 (LL) 12/21/2021 1503   HCO3 28.8 (H) 12/21/2021 1503   TCO2 30 12/21/2021 1503   ACIDBASEDEF 3.0 (H) 03/12/2021 1158   O2SAT 76 12/21/2021 1503     Coagulation Profile: No results for input(s): INR, PROTIME in the last 168 hours.  Cardiac Enzymes: No results for input(s): CKTOTAL, CKMB, CKMBINDEX, TROPONINI in the last 168 hours.  HbA1C: Hgb A1c MFr Bld  Date/Time Value Ref Range Status  03/06/2019 05:22 AM 5.6 4.8 - 5.6 % Final    Comment:    (NOTE) Pre diabetes:          5.7%-6.4% Diabetes:              >6.4% Glycemic control for   <7.0% adults with diabetes   04/15/2017 04:31 PM 5.6 4.8 - 5.6 % Final    Comment:    (NOTE) Pre diabetes:          5.7%-6.4% Diabetes:              >6.4% Glycemic control for   <7.0% adults with diabetes     CBG: No results for input(s): GLUCAP in the last 168 hours.  Review of Systems:   Review of Systems  Constitutional:  Positive for  malaise/fatigue.  HENT: Negative.    Eyes: Negative.   Respiratory:  Positive for cough and shortness of breath.   Cardiovascular: Negative.   Gastrointestinal: Negative.   Genitourinary: Negative.   Musculoskeletal: Negative.   Skin: Negative.   Endo/Heme/Allergies: Negative.   Psychiatric/Behavioral: Negative.      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   Surgical History:   Past Surgical History:  Procedure Laterality Date   BREAST CYST EXCISION Left    CARDIOVERSION N/A 03/09/2019   Procedure: CARDIOVERSION;  Surgeon: Jolaine Artist, MD;  Location: [facility] ENDOSCOPY;  Service: Cardiovascular;  Laterality: N/A;   CARDIOVERSION N/A 07/05/2019   Procedure: CARDIOVERSION;  Surgeon: Thompson Grayer, MD;  Location: [place] CV LAB;  Service: Cardiovascular;  Laterality: N/A;   CORONARY/GRAFT ANGIOGRAPHY N/A 04/15/2017   Procedure: CORONARY/GRAFT ANGIOGRAPHY;  Surgeon: Nelva Bush, MD;  Location: [place] CV LAB;  Service: Cardiovascular;  Laterality: N/A;   RIGHT HEART CATH N/A 04/15/2017   Procedure: RIGHT HEART CATH;  Surgeon: Nelva Bush, MD;  Location: [place] CV LAB;  Service: Cardiovascular;  Laterality: N/A;   RIGHT HEART CATH N/A 03/12/2021   Procedure: RIGHT HEART CATH;  Surgeon: Jolaine Artist, MD;  Location: [place] CV LAB;  Service: Cardiovascular;  Laterality: N/A;   RIGHT HEART CATH N/A 12/21/2021   Procedure: RIGHT HEART CATH;  Surgeon: Jolaine Artist, MD;  Location: [place] CV LAB;  Service:  Cardiovascular;  Laterality: N/A;   TEE WITHOUT CARDIOVERSION N/A 03/09/2019   Procedure: TRANSESOPHAGEAL ECHOCARDIOGRAM (TEE);  Surgeon: Jolaine Artist, MD;  Location: [facility] ENDOSCOPY;  Service: Cardiovascular;  Laterality: N/A;   TEE WITHOUT CARDIOVERSION N/A 07/05/2019   Procedure: TRANSESOPHAGEAL ECHOCARDIOGRAM (TEE);  Surgeon: Thompson Grayer, MD;  Location: [place] CV LAB;  Service: Cardiovascular;  Laterality: N/A;   TEE WITHOUT CARDIOVERSION N/A 04/21/2021   Procedure: TRANSESOPHAGEAL ECHOCARDIOGRAM (TEE);  Surgeon: Fay Records, MD;  Location: [place];  Service: Cardiovascular;  Laterality: N/A;   TUBAL LIGATION       Social History:   reports that she has never smoked. She has never used smokeless tobacco. She reports that she does not currently use alcohol. She reports that she does not currently use drugs.   Family History:  Her family history includes Arthritis in her mother and sister; Diabetes in her father; Hypertension in her mother.   Allergies No Known Allergies   Home Medications  Prior to Admission medications   Medication Sig Start Date End Date Taking? Authorizing Provider  acetaminophen (TYLENOL) 500 MG tablet Take 500 mg by mouth every 6 (six) hours as needed for moderate pain or headache.    [provider]  amiodarone (PACERONE) 200 MG tablet TAKE 1 Tablet BY MOUTH ONCE EVERY DAY 01/06/22   Bensimhon, Shaune Pascal, MD  anastrozole (ARIMIDEX) 1 MG tablet Take 1 tablet (1 mg total) by mouth every evening. 12/14/21   Mosher, Vida Roller A, PA-C  apixaban (ELIQUIS) 5 MG TABS tablet Take 1 tablet (5 mg total) by mouth 2 (two) times daily. 10/28/21   Bensimhon, Shaune Pascal, MD  furosemide (LASIX) 20 MG tablet Take 3 tablets (60 mg total) by mouth in the morning AND 2 tablets (40 mg total) every evening. 12/31/21   Lyda Jester M, PA-C  mexiletine (MEXITIL) 150 MG capsule Take 2 capsules (300 mg total) by mouth every 12 (twelve) hours. 12/25/21 01/24/22  Arrien,  Jimmy Picket, MD  omeprazole (PRILOSEC) 20 MG capsule Take 20 mg by mouth daily.  [provider]  spironolactone (ALDACTONE) 25 MG tablet Take 1 tablet (25 mg total) by mouth daily. 12/26/21 01/25/22  Arrien, Jimmy Picket, MD     Critical care time: 40 min    CRITICAL CARE Performed by: Cristal Generous  Total critical care time: 40 minutes  Critical care time was exclusive of separately billable procedures and treating other patients. Critical care was necessary to treat or prevent imminent or life-threatening deterioration.  Critical care was time spent personally by me on the following activities: development of treatment plan with patient and/or surrogate as well as nursing, discussions with consultants, evaluation of patient's response to treatment, examination of patient, obtaining history from patient or surrogate, ordering and performing treatments and interventions, ordering and review of laboratory studies, ordering and review of radiographic studies, pulse oximetry and re-evaluation of patient's condition.  Eliseo Gum MSN, AGACNP-BC [place] for pager 01/10/2022, 8:49 AM

## 2022-01-10 NOTE — Assessment & Plan Note (Signed)
-   Continue anaxtrozole

## 2022-01-10 NOTE — Progress Notes (Signed)
Notified Dr. Nevada Crane that patient remains hypotensive after 500cc fluid bolus that was given in ED.  Midodrine ordered and given.  Will continue to closely monitor.

## 2022-01-10 NOTE — Progress Notes (Signed)
Vitals     Blood pressure (!) 70/51, pulse 80, temperature 98.6 F (37 C), temperature source Oral, resp. rate 20, height '4\' 9"'$  (1.448 m), weight 59.2 kg, SpO2 97 %.  Called at 7:22AM that patient was hypotensive progressively overnight.  After admission, BP trending steadily from baseline 201 mmHg systolic to 71 systolic now with MAP in 00F over last few hours.  Got 1.5L LR in the ER.  RRR on exam, no murmurs, no periphreal edema or JVD.  Respiratory rate fast and shallow but no rales or wheezing.  Cap refill normal, skin normal, no mottling.  PUlses seem relatively normal actually. She is sleepy but mentating where she is and what year and says just sleepy.   Give 500 cc LR now to complete 30 cc/kg bolus.  Continue antibiotics for pneumonia.  Adv HF are consulted and will see today.  Agree not in cardiogenic shock, but I am concerned about her dropping BP and not responding to fluids to this point.  I have spoken with CCM who will evaluate the patient now in consideration of transfer to ICU for pressors.

## 2022-01-10 NOTE — Consult Note (Signed)
Cardiology Consultation:   Patient ID: Megan Keith MRN: [id number]; DOB: [birth year]  Admit date: 01/09/2022 Date of Consult: 01/10/2022  Primary Care Provider: Earna Coder, NP Primary Cardiologist: Glori Bickers, MD  Primary Electrophysiologist:  None    Patient Profile:   Megan Keith is a 61 y.o. female with a hx of NICM with chronic systolic HFrEF (EF 40%), pAfib on eliquis, h/o VT on amidarone and mexiletine, prior breast cancer s/p radiation and hormonal therapy who is being seen today for the evaluation of concern for cardiogenic shock at the request of Dr. Nevada Crane / [facility].  History of Present Illness:   Megan Keith is a 61 y.o. female with a hx of NICM with chronic systolic HFrEF (EF 98%), pAfib on eliquis, h/o VT on amidarone and mexiletine, prior breast cancer s/p radiation and hormonal therapy who intially presented with fevers, hypotension and tachycardia. Was found to have a RUL PNA on chest imaging. Treated with vanc/cefe initiallion then transitioned to CTX/doxy.   Team felt she was hypovolemic on presentations fitting with sepsis, but also possible overdiuresis. She has received almost 2L of IV fluid rescuscitation since presentation. Initial lactate was 3.2 but normalized to 1.4 later in the evening.   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 ? PVC cardiomyopathy. right and left heart catheterization on 04/15/17, no CAD. Fick output/index 5.28/3.34.   Noncompliance    a. episodic  noncompliance.   Pneumonia 06/2016    Past Surgical History:  Procedure Laterality Date   BREAST CYST EXCISION Left    CARDIOVERSION N/A 03/09/2019   Procedure: CARDIOVERSION;  Surgeon: Jolaine Artist, MD;  Location: [facility] ENDOSCOPY;  Service: Cardiovascular;  Laterality: N/A;   CARDIOVERSION N/A 07/05/2019   Procedure: CARDIOVERSION;  Surgeon: Thompson Grayer, MD;  Location: [place] CV LAB;  Service: Cardiovascular;  Laterality: N/A;   CORONARY/GRAFT ANGIOGRAPHY N/A 04/15/2017   Procedure: CORONARY/GRAFT ANGIOGRAPHY;  Surgeon: Nelva Bush, MD;  Location: [place] CV LAB;  Service: Cardiovascular;  Laterality: N/A;   RIGHT HEART CATH N/A 04/15/2017   Procedure: RIGHT HEART CATH;  Surgeon: Nelva Bush, MD;  Location: [place] CV LAB;  Service: Cardiovascular;  Laterality: N/A;   RIGHT HEART CATH N/A 03/12/2021   Procedure: RIGHT HEART CATH;  Surgeon: Jolaine Artist, MD;  Location: [place] CV LAB;  Service: Cardiovascular;  Laterality: N/A;   RIGHT HEART CATH N/A 12/21/2021   Procedure: RIGHT HEART CATH;  Surgeon: Jolaine Artist, MD;  Location: [place] CV LAB;  Service: Cardiovascular;  Laterality: N/A;   TEE WITHOUT CARDIOVERSION N/A 03/09/2019   Procedure: TRANSESOPHAGEAL ECHOCARDIOGRAM (TEE);  Surgeon: Jolaine Artist, MD;  Location: [facility] ENDOSCOPY;  Service: Cardiovascular;  Laterality: N/A;   TEE WITHOUT CARDIOVERSION N/A 07/05/2019   Procedure: TRANSESOPHAGEAL ECHOCARDIOGRAM (TEE);  Surgeon: Thompson Grayer, MD;  Location: [place] CV LAB;  Service: Cardiovascular;  Laterality: N/A;   TEE WITHOUT CARDIOVERSION N/A 04/21/2021  Procedure: TRANSESOPHAGEAL ECHOCARDIOGRAM (TEE);  Surgeon: Fay Records, MD;  Location: [facility] ENDOSCOPY;  Service: Cardiovascular;  Laterality: N/A;   TUBAL LIGATION       Home Medications:  Prior to Admission medications   Medication Sig Start Date End Date Taking? Authorizing Provider  acetaminophen (TYLENOL) 500 MG tablet  Take 500 mg by mouth every 6 (six) hours as needed for moderate pain or headache.    [provider]  amiodarone (PACERONE) 200 MG tablet TAKE 1 Tablet BY MOUTH ONCE EVERY DAY 01/06/22   Bensimhon, Shaune Pascal, MD  anastrozole (ARIMIDEX) 1 MG tablet Take 1 tablet (1 mg total) by mouth every evening. 12/14/21   Mosher, Vida Roller A, PA-C  apixaban (ELIQUIS) 5 MG TABS tablet Take 1 tablet (5 mg total) by mouth 2 (two) times daily. 10/28/21   Bensimhon, Shaune Pascal, MD  furosemide (LASIX) 20 MG tablet Take 3 tablets (60 mg total) by mouth in the morning AND 2 tablets (40 mg total) every evening. 12/31/21   Lyda Jester M, PA-C  mexiletine (MEXITIL) 150 MG capsule Take 2 capsules (300 mg total) by mouth every 12 (twelve) hours. 12/25/21 01/24/22  Arrien, Jimmy Picket, MD  omeprazole (PRILOSEC) 20 MG capsule Take 20 mg by mouth daily.    [provider]  spironolactone (ALDACTONE) 25 MG tablet Take 1 tablet (25 mg total) by mouth daily. 12/26/21 01/25/22  Arrien, Jimmy Picket, MD    Inpatient Medications: Scheduled Meds:  [START ON 01/11/2022] amiodarone  200 mg Oral Daily   anastrozole  1 mg Oral QPM   apixaban  5 mg Oral BID   doxycycline  100 mg Oral Q12H   guaiFENesin  1,200 mg Oral BID   [START ON 01/11/2022] mexiletine  300 mg Oral Q12H   pantoprazole  40 mg Oral Daily   Continuous Infusions:  cefTRIAXone (ROCEPHIN)  IV 1 g (01/10/22 0410)   PRN Meds: acetaminophen, prochlorperazine  Allergies:   No Known Allergies  Social History:   Social History   Socioeconomic History   Marital status: Single    Spouse name: Not on file   Number of children: 4   Years of education: Not on file   Highest education level: Not on file  Occupational History   Not on file  Tobacco Use   Smoking status: Never   Smokeless tobacco: Never  Vaping Use   Vaping Use: Never used  Substance and Sexual Activity   Alcohol use: Not Currently    Comment: 04/15/2017 "might have a couple beers q  couple months"   Drug use: Not Currently   Sexual activity: Not Currently  Other Topics Concern   Not on file  Social History Narrative   ** Merged History Encounter **       N/A   Social Determinants of Health   Financial Resource Strain: Not on file  Food Insecurity: Not on file  Transportation Needs: Not on file  Physical Activity: Not on file  Stress: Not on file  Social Connections: Not on file  Intimate Partner Violence: Not on file    Family History:    Family History  Problem Relation Age of Onset   Hypertension Mother    Arthritis Mother    Diabetes Father    Arthritis Sister      Review of Systems: [y] = yes, '[ ]'$  = no    General: Weight gain '[ ]'$ ; Weight loss '[ ]'$ ; Anorexia '[ ]'$ ; Fatigue [ y]; Fever '[ ]'$ ; Chills [  y]; Weakness [ y]  Cardiac: Chest pain/pressure '[ ]'$ ; Resting SOB '[ ]'$ ; Exertional SOB '[ ]'$ ; Orthopnea '[ ]'$ ; Pedal Edema '[ ]'$ ; Palpitations '[ ]'$ ; Syncope '[ ]'$ ; Presyncope '[ ]'$ ; Paroxysmal nocturnal dyspnea'[ ]'$   Pulmonary: Cough [ y]; Wheezing'[ ]'$ ; Hemoptysis'[ ]'$ ; Sputum [ y]; Snoring '[ ]'$   GI: Vomiting'[ ]'$ ; Dysphagia'[ ]'$ ; Melena'[ ]'$ ; Hematochezia '[ ]'$ ; Heartburn'[ ]'$ ; Abdominal pain '[ ]'$ ; Constipation '[ ]'$ ; Diarrhea '[ ]'$ ; BRBPR '[ ]'$   GU: Hematuria'[ ]'$ ; Dysuria '[ ]'$ ; Nocturia'[ ]'$   Vascular: Pain in legs with walking '[ ]'$ ; Pain in feet with lying flat '[ ]'$ ; Non-healing sores '[ ]'$ ; Stroke '[ ]'$ ; TIA '[ ]'$ ; Slurred speech '[ ]'$ ;  Neuro: Headaches'[ ]'$ ; Vertigo'[ ]'$ ; Seizures'[ ]'$ ; Paresthesias'[ ]'$ ;Blurred vision '[ ]'$ ; Diplopia '[ ]'$ ; Vision changes '[ ]'$   Ortho/Skin: Arthritis '[ ]'$ ; Joint pain '[ ]'$ ; Muscle pain '[ ]'$ ; Joint swelling '[ ]'$ ; Back Pain '[ ]'$ ; Rash '[ ]'$   Psych: Depression'[ ]'$ ; Anxiety'[ ]'$   Heme: Bleeding problems '[ ]'$ ; Clotting disorders '[ ]'$ ; Anemia '[ ]'$   Endocrine: Diabetes '[ ]'$ ; Thyroid dysfunction'[ ]'$   Physical Exam/Data:   Vitals:   01/10/22 0340 01/10/22 0415 01/10/22 0416 01/10/22 0439  BP: (!) 70/57 (!) 70/50 (!) 79/58 (!) 77/62  Pulse: 68  87 84  Resp: (!) 28   18  Temp: 98.4 F (36.9 C)      TempSrc: Oral     SpO2: 96%   97%  Height:        Intake/Output Summary (Last 24 hours) at 01/10/2022 0516 Last data filed at 01/10/2022 0410 Gross per 24 hour  Intake 1903.22 ml  Output 100 ml  Net 1803.22 ml   There were no vitals filed for this visit. Body mass index is 28.18 kg/m.  General: in no acute distress HEENT: normal Lymph: no adenopathy Neck: no JVD Endocrine:  No thryomegaly Vascular: No carotid bruits; FA pulses 2+ bilaterally without bruits  Cardiac:  normal S1, S2; RRR; no murmur  Lungs:  coarse breath sounds, no increased work of breathing  Abd: soft, nontender, no hepatomegaly  Ext: no edema Musculoskeletal:  No deformities, BUE and BLE strength normal and equal Skin: warm and dry  Neuro:  CNs 2-12 intact, no focal abnormalities noted Psych:  Normal affect   EKG:  The EKG was personally reviewed and demonstrates:  NSR Telemetry:  Telemetry was personally reviewed and demonstrates:  NSR  Relevant CV Studies: Cardiac MRI 5/1: IMPRESSION: 1.  Severe LV dilatation with severe systolic dysfunction (EF 50%)   2.  Mild RV dilatation with moderate systolic dysfunction (EF 53%)   3. Midwall LGE in LV lateral wall, which is a scar pattern from nonischemic etiology such as prior myocarditis   4. RV insertion site LGE, which is a nonspecific scar pattern often seen in setting of elevated pulmonary pressures   5.  Moderate pericardial effusion   6.  Moderate mitral regurgitation (regurgitant fraction 32%)   7. Moderate to severe tricuspid regurgitation (was not quantified)   Laboratory Data:  Chemistry Recent Labs  Lab 01/09/22 1420  NA 135  K 3.5  CL 103  CO2 21*  GLUCOSE 111*  BUN 12  CREATININE 0.88  CALCIUM 9.3  GFRNONAA >60  ANIONGAP 11    Recent Labs  Lab 01/09/22 1550  PROT 7.7  ALBUMIN 4.2  AST 30  ALT 30  ALKPHOS 104  BILITOT 0.8   Hematology Recent Labs  Lab 01/09/22 1420  WBC 11.8*  RBC 4.93  HGB 13.6  HCT 43.0   MCV 87.2  MCH 27.6  MCHC 31.6  RDW 18.1*  PLT 282   Cardiac EnzymesNo results for input(s): TROPONINI in the last 168 hours. No results for input(s): TROPIPOC in the last 168 hours.  BNP Recent Labs  Lab 01/09/22 1550  BNP 1,479.2*    DDimer No results for input(s): DDIMER in the last 168 hours.  Radiology/Studies:  DG Chest 2 View  Result Date: 01/09/2022 CLINICAL DATA:  Shortness of breath, chest pain, cough EXAM: CHEST - 2 VIEW COMPARISON:  Previous studies including the examination of 12/17/2021 FINDINGS: Transverse diameter of heart is increased. There is prominence of interstitial markings in the parahilar regions. There is no focal consolidation. There are no signs of alveolar pulmonary edema. There is no pleural effusion or pneumothorax. IMPRESSION: Cardiomegaly. There is prominence of interstitial markings in the parahilar regions suggesting mild interstitial edema or interstitial pneumonia. There is no focal pulmonary consolidation. There is no pleural effusion or pneumothorax. Electronically Signed   By: Elmer Picker M.D.   On: 01/09/2022 15:02   DG Knee 1-2 Views Right  Result Date: 01/09/2022 CLINICAL DATA:  Left knee pain.  Patient reports a fall 3 years ago. EXAM: RIGHT KNEE - 1-2 VIEW COMPARISON:  None Available. FINDINGS: No evidence of acute fracture or dislocation. There is a trace joint effusion. There is chronic appearing depression of the lateral tibial plateau with subchondral sclerosis and apex medial angulation of the knee joint. IMPRESSION: 1. Chronic appearing depression of the lateral tibial plateau and apex medial angulation of the knee joint. 2. Trace joint effusion. Electronically Signed   By: Zerita Boers M.D.   On: 01/09/2022 18:39   CT Angio Chest PE W and/or Wo Contrast  Result Date: 01/09/2022 CLINICAL DATA:  Dyspnea. Tachycardia. High clinical probability for pulmonary embolism. Abdominal pain. EXAM: CT ANGIOGRAPHY CHEST CT ABDOMEN AND PELVIS  WITH CONTRAST TECHNIQUE: Multidetector CT imaging of the chest was performed using the standard protocol during bolus administration of intravenous contrast. Multiplanar CT image reconstructions and MIPs were obtained to evaluate the vascular anatomy. Multidetector CT imaging of the abdomen and pelvis was performed using the standard protocol during bolus administration of intravenous contrast. RADIATION DOSE REDUCTION: This exam was performed according to the departmental dose-optimization program which includes automated exposure control, adjustment of the mA and/or kV according to patient size and/or use of iterative reconstruction technique. CONTRAST:  120m OMNIPAQUE IOHEXOL 350 MG/ML SOLN COMPARISON:  None Available. FINDINGS: CTA CHEST FINDINGS Cardiovascular: Satisfactory opacification of pulmonary arteries noted, and no pulmonary emboli identified. Moderate cardiomegaly noted. Aortic and coronary atherosclerotic calcification incidentally noted. Mediastinum/Nodes: No masses or pathologically enlarged lymph nodes identified. Lungs/Pleura: Focal airspace disease seen in the right upper lobe, likely due to pneumonia. No evidence of central endobronchial obstruction or pleural effusion. Musculoskeletal: No suspicious bone lesions identified. Review of the MIP images confirms the above findings. CT ABDOMEN and PELVIS FINDINGS Hepatobiliary: No hepatic masses identified. Small benign cyst seen near junction of right and left hepatic lobes. Gallbladder is unremarkable. No evidence of biliary ductal dilatation. Pancreas:  No mass or inflammatory changes. Spleen: Within normal limits in size and appearance. Adrenals/Urinary Tract: No masses identified. No evidence of ureteral calculi or hydronephrosis. Stomach/Bowel: No evidence of obstruction, inflammatory process or abnormal fluid collections. Vascular/Lymphatic: No pathologically enlarged lymph nodes. No acute vascular findings. Reproductive: Prior hysterectomy  noted. Adnexal regions are unremarkable in appearance. Other:  None. Musculoskeletal:  No  suspicious bone lesions identified. Review of the MIP images confirms the above findings. IMPRESSION: No evidence of pulmonary embolism. Focal airspace disease in right upper lobe, likely due to pneumonia. No acute findings within the abdomen or pelvis. Aortic Atherosclerosis (ICD10-I70.0). Electronically Signed   By: Marlaine Hind M.D.   On: 01/09/2022 17:14   CT ABDOMEN PELVIS W CONTRAST  Result Date: 01/09/2022 CLINICAL DATA:  Dyspnea. Tachycardia. High clinical probability for pulmonary embolism. Abdominal pain. EXAM: CT ANGIOGRAPHY CHEST CT ABDOMEN AND PELVIS WITH CONTRAST TECHNIQUE: Multidetector CT imaging of the chest was performed using the standard protocol during bolus administration of intravenous contrast. Multiplanar CT image reconstructions and MIPs were obtained to evaluate the vascular anatomy. Multidetector CT imaging of the abdomen and pelvis was performed using the standard protocol during bolus administration of intravenous contrast. RADIATION DOSE REDUCTION: This exam was performed according to the departmental dose-optimization program which includes automated exposure control, adjustment of the mA and/or kV according to patient size and/or use of iterative reconstruction technique. CONTRAST:  117m OMNIPAQUE IOHEXOL 350 MG/ML SOLN COMPARISON:  None Available. FINDINGS: CTA CHEST FINDINGS Cardiovascular: Satisfactory opacification of pulmonary arteries noted, and no pulmonary emboli identified. Moderate cardiomegaly noted. Aortic and coronary atherosclerotic calcification incidentally noted. Mediastinum/Nodes: No masses or pathologically enlarged lymph nodes identified. Lungs/Pleura: Focal airspace disease seen in the right upper lobe, likely due to pneumonia. No evidence of central endobronchial obstruction or pleural effusion. Musculoskeletal: No suspicious bone lesions identified. Review of the MIP  images confirms the above findings. CT ABDOMEN and PELVIS FINDINGS Hepatobiliary: No hepatic masses identified. Small benign cyst seen near junction of right and left hepatic lobes. Gallbladder is unremarkable. No evidence of biliary ductal dilatation. Pancreas:  No mass or inflammatory changes. Spleen: Within normal limits in size and appearance. Adrenals/Urinary Tract: No masses identified. No evidence of ureteral calculi or hydronephrosis. Stomach/Bowel: No evidence of obstruction, inflammatory process or abnormal fluid collections. Vascular/Lymphatic: No pathologically enlarged lymph nodes. No acute vascular findings. Reproductive: Prior hysterectomy noted. Adnexal regions are unremarkable in appearance. Other:  None. Musculoskeletal:  No suspicious bone lesions identified. Review of the MIP images confirms the above findings. IMPRESSION: No evidence of pulmonary embolism. Focal airspace disease in right upper lobe, likely due to pneumonia. No acute findings within the abdomen or pelvis. Aortic Atherosclerosis (ICD10-I70.0). Electronically Signed   By: JMarlaine HindM.D.   On: 01/09/2022 17:14    Assessment and Plan:   #Severe sepsis.  Initially presenting in septic shock with fevers, leukocytosis, evidence of PNA on CT, hypotension, and lactate 3.2. However the lactate resolved with mild fluid resuscitation and the patient is now hemodynamically stable. Does not appear to be clinically in shock and no signs of ongoing tissue hypoperfusion evidenced by normalized lactate . Patient has a history of low BP reflected by inability to tolerate initiation of GDMT. I would not start diuresis back as of now, would give her another 12-24 hours before trialing diuresis again. Do not want to hold too long so that she does not become volume overloaded. Agree with continuing amio, mexiletine, and apixaban. Continue holding spiro. Defer infectious management to hospital medicine team.   - no evidence of cardiogenic  shock at this time. She appears to have presented with septic shock that resolved with fluid administration.  - continue to hold diuresis for now; may consider restarting PO maintenance diuretics tomorrow  - hold spironolactone  - continue amiodarone, mexiletine, and apixaban      For questions or updates,  please contact [place] Please consult [email or web address] for contact info under     Signed, Doyne Keel, MD  01/10/2022 5:16 AM

## 2022-01-10 NOTE — Progress Notes (Incomplete)
When RN started shift pt's BP 72/51 and pt had red MEWS. This RN and pm shift RN paged MD, who called back immediately and came to bedside. Bolus was ordered and started, then d/c'd and stopped. Pt a&o but drowsy. Pt follows commands. Pt to be trx to ICU. RN continuing to monitor

## 2022-01-10 NOTE — Progress Notes (Signed)
Peripherally Inserted Central Catheter Placement  The IV Nurse has discussed with the patient and/or persons authorized to consent for the patient, the purpose of this procedure and the potential benefits and risks involved with this procedure.  The benefits include less needle sticks, lab draws from the catheter, and the patient may be discharged home with the catheter. Risks include, but not limited to, infection, bleeding, blood clot (thrombus formation), and puncture of an artery; nerve damage and irregular heartbeat and possibility to perform a PICC exchange if needed/ordered by physician.  Alternatives to this procedure were also discussed.  Bard Power PICC patient education guide, fact sheet on infection prevention and patient information card has been provided to patient /or left at bedside. Discussed PICC at length via [place] and granddaughter Jim Desanctis via telephone. Pt and family agreeable to place PICC and telephone consent received.  PICC Placement Documentation  PICC Double Lumen 59/09/31 Right Basilic 37 cm 0 cm (Active)  Indication for Insertion or Continuance of Line Vasoactive infusions 01/10/22 1210  Exposed Catheter (cm) 0 cm 01/10/22 1210  Site Assessment Clean, Dry, Intact 01/10/22 1210  Lumen #1 Status Flushed;Saline locked;Blood return noted 01/10/22 1210  Lumen #2 Status Flushed;Saline locked;Blood return noted 01/10/22 1210  Dressing Type Transparent;Securing device 01/10/22 1210  Dressing Status Antimicrobial disc in place 01/10/22 1210  Safety Lock Not Applicable 08/08/23 4695  Line Care Connections checked and tightened 01/10/22 1210  Line Adjustment (NICU/IV Team Only) No 01/10/22 1210  Dressing Intervention New dressing 01/10/22 1210  Dressing Change Due 01/17/22 01/10/22 [place] 01/10/2022, 12:11 PM

## 2022-01-11 ENCOUNTER — Other Ambulatory Visit (HOSPITAL_COMMUNITY): Payer: Self-pay

## 2022-01-11 ENCOUNTER — Encounter: Payer: Self-pay | Admitting: Oncology

## 2022-01-11 LAB — CBC
HCT: 36.8 % (ref 36.0–46.0)
Hemoglobin: 11.9 g/dL — ABNORMAL LOW (ref 12.0–15.0)
MCH: 28.7 pg (ref 26.0–34.0)
MCHC: 32.3 g/dL (ref 30.0–36.0)
MCV: 88.7 fL (ref 80.0–100.0)
Platelets: 199 10*3/uL (ref 150–400)
RBC: 4.15 MIL/uL (ref 3.87–5.11)
RDW: 18.6 % — ABNORMAL HIGH (ref 11.5–15.5)
WBC: 6.4 10*3/uL (ref 4.0–10.5)
nRBC: 0 % (ref 0.0–0.2)

## 2022-01-11 LAB — BASIC METABOLIC PANEL
Anion gap: 4 — ABNORMAL LOW (ref 5–15)
BUN: 9 mg/dL (ref 8–23)
CO2: 19 mmol/L — ABNORMAL LOW (ref 22–32)
Calcium: 8.5 mg/dL — ABNORMAL LOW (ref 8.9–10.3)
Chloride: 114 mmol/L — ABNORMAL HIGH (ref 98–111)
Creatinine, Ser: 0.77 mg/dL (ref 0.44–1.00)
GFR, Estimated: >60 mL/min (ref >60)
Glucose, Bld: 105 mg/dL — ABNORMAL HIGH (ref 70–99)
Potassium: 3.5 mmol/L (ref 3.5–5.1)
Sodium: 137 mmol/L (ref 135–145)

## 2022-01-11 LAB — MYCOPLASMA PNEUMONIAE ANTIBODY, IGM: Mycoplasma pneumo IgM: <770 U/mL (ref 0–769)

## 2022-01-11 MED ORDER — DOXYCYCLINE HYCLATE 100 MG PO TABS
100.0000 mg | ORAL_TABLET | Freq: Two times a day (BID) | ORAL | 0 refills | Status: DC
  Filled 2022-01-11 (×2): qty 9, 5d supply, fill #0

## 2022-01-11 MED ORDER — CEFPODOXIME PROXETIL 200 MG PO TABS
200.0000 mg | ORAL_TABLET | Freq: Two times a day (BID) | ORAL | 0 refills | Status: DC
  Filled 2022-01-11 (×2): qty 8, 4d supply, fill #0

## 2022-01-11 MED ORDER — GUAIFENESIN ER 600 MG PO TB12
1200.0000 mg | ORAL_TABLET | Freq: Two times a day (BID) | ORAL | 0 refills | Status: DC | PRN
  Filled 2022-01-11 (×2): qty 20, 5d supply, fill #0

## 2022-01-11 MED ORDER — FUROSEMIDE 40 MG PO TABS
40.0000 mg | ORAL_TABLET | Freq: Every day | ORAL | 0 refills | Status: DC
  Filled 2022-01-11 (×2): qty 30, 30d supply, fill #0

## 2022-01-11 NOTE — Progress Notes (Addendum)
Progress Note  Patient Name: Megan Keith Date of Encounter: 01/11/2022  [facility] Cardiologist: Glori Bickers, MD   Subjective   Ambulating to the bathroom.  Appears well.  No shortness of breath.  Inpatient Medications    Scheduled Meds:  amiodarone  200 mg Oral Daily   anastrozole  1 mg Oral QPM   apixaban  5 mg Oral BID   Chlorhexidine Gluconate Cloth  6 each Topical Daily   doxycycline  100 mg Oral Q12H   guaiFENesin  1,200 mg Oral BID   mexiletine  300 mg Oral Q12H   pantoprazole  40 mg Oral Daily   sodium chloride flush  10-40 mL Intracatheter Q12H   Continuous Infusions:  cefTRIAXone (ROCEPHIN)  IV 1 g (01/11/22 0500)   PRN Meds: acetaminophen, phenol, prochlorperazine, sodium chloride flush   Vital Signs    Vitals:   01/10/22 1900 01/11/22 0000 01/11/22 0400 01/11/22 0733  BP: 101/66 121/73 100/62 116/61  Pulse: 94 85 72 76  Resp: '20 17 19 19  '$ Temp: 98.5 F (36.9 C) 98.4 F (36.9 C) 97.9 F (36.6 C) 98.4 F (36.9 C)  TempSrc: Oral Oral    SpO2: 99% 100% 94% 98%  Weight:      Height:        Intake/Output Summary (Last 24 hours) at 01/11/2022 0806 Last data filed at 01/10/2022 1627 Gross per 24 hour  Intake 138.59 ml  Output --  Net 138.59 ml      01/10/2022    6:10 AM 12/31/2021    9:49 AM 12/25/2021    3:35 AM  Last 3 Weights  Weight (lbs) 130 lb 8.2 oz 130 lb 3.2 oz 127 lb 4.8 oz  Weight (kg) 59.2 kg 59.058 kg 57.743 kg      Telemetry    No ventricular tachycardia occasional PVCs- Personally Reviewed  ECG    As described below- Personally Reviewed  Physical Exam   GEN: No acute distress.   Neck: No JVD Cardiac: RRR, no murmurs, rubs, or gallops.  Respiratory: Clear to auscultation bilaterally. GI: Soft, nontender, non-distended  MS: No edema; No deformity. Neuro:  Nonfocal  Psych: Normal affect   Labs    High Sensitivity Troponin:   Recent Labs  Lab 12/17/21 2142 01/09/22 1550 01/10/22 0519   TROPONINIHS '13 13 12     '$ Chemistry Recent Labs  Lab 01/09/22 1420 01/09/22 1550 01/10/22 0519 01/11/22 0045  NA 135  --   --  137  K 3.5  --   --  3.5  CL 103  --   --  114*  CO2 21*  --   --  19*  GLUCOSE 111*  --   --  105*  BUN 12  --   --  9  CREATININE 0.88  --   --  0.77  CALCIUM 9.3  --   --  8.5*  MG  --   --  2.1  --   PROT  --  7.7  --   --   ALBUMIN  --  4.2  --   --   AST  --  30  --   --   ALT  --  30  --   --   ALKPHOS  --  104  --   --   BILITOT  --  0.8  --   --   GFRNONAA >60  --   --  >60  ANIONGAP 11  --   --  4*    Lipids No results for input(s): CHOL, TRIG, HDL, LABVLDL, LDLCALC, CHOLHDL in the last 168 hours.  Hematology Recent Labs  Lab 01/09/22 1420 01/10/22 0519 01/11/22 0045  WBC 11.8* 9.1 6.4  RBC 4.93 4.13 4.15  HGB 13.6 11.7* 11.9*  HCT 43.0 35.7* 36.8  MCV 87.2 86.4 88.7  MCH 27.6 28.3 28.7  MCHC 31.6 32.8 32.3  RDW 18.1* 18.4* 18.6*  PLT 282 231 199   Thyroid No results for input(s): TSH, FREET4 in the last 168 hours.  BNP Recent Labs  Lab 01/09/22 1550 01/10/22 0519  BNP 1,479.2* 1,579.1*    DDimer No results for input(s): DDIMER in the last 168 hours.   Radiology    DG Chest 2 View  Result Date: 01/09/2022 CLINICAL DATA:  Shortness of breath, chest pain, cough EXAM: CHEST - 2 VIEW COMPARISON:  Previous studies including the examination of 12/17/2021 FINDINGS: Transverse diameter of heart is increased. There is prominence of interstitial markings in the parahilar regions. There is no focal consolidation. There are no signs of alveolar pulmonary edema. There is no pleural effusion or pneumothorax. IMPRESSION: Cardiomegaly. There is prominence of interstitial markings in the parahilar regions suggesting mild interstitial edema or interstitial pneumonia. There is no focal pulmonary consolidation. There is no pleural effusion or pneumothorax. Electronically Signed   By: Elmer Picker M.D.   On: 01/09/2022 15:02   DG Knee  1-2 Views Right  Result Date: 01/09/2022 CLINICAL DATA:  Left knee pain.  Patient reports a fall 3 years ago. EXAM: RIGHT KNEE - 1-2 VIEW COMPARISON:  None Available. FINDINGS: No evidence of acute fracture or dislocation. There is a trace joint effusion. There is chronic appearing depression of the lateral tibial plateau with subchondral sclerosis and apex medial angulation of the knee joint. IMPRESSION: 1. Chronic appearing depression of the lateral tibial plateau and apex medial angulation of the knee joint. 2. Trace joint effusion. Electronically Signed   By: Zerita Boers M.D.   On: 01/09/2022 18:39   CT Angio Chest PE W and/or Wo Contrast  Result Date: 01/09/2022 CLINICAL DATA:  Dyspnea. Tachycardia. High clinical probability for pulmonary embolism. Abdominal pain. EXAM: CT ANGIOGRAPHY CHEST CT ABDOMEN AND PELVIS WITH CONTRAST TECHNIQUE: Multidetector CT imaging of the chest was performed using the standard protocol during bolus administration of intravenous contrast. Multiplanar CT image reconstructions and MIPs were obtained to evaluate the vascular anatomy. Multidetector CT imaging of the abdomen and pelvis was performed using the standard protocol during bolus administration of intravenous contrast. RADIATION DOSE REDUCTION: This exam was performed according to the departmental dose-optimization program which includes automated exposure control, adjustment of the mA and/or kV according to patient size and/or use of iterative reconstruction technique. CONTRAST:  141m OMNIPAQUE IOHEXOL 350 MG/ML SOLN COMPARISON:  None Available. FINDINGS: CTA CHEST FINDINGS Cardiovascular: Satisfactory opacification of pulmonary arteries noted, and no pulmonary emboli identified. Moderate cardiomegaly noted. Aortic and coronary atherosclerotic calcification incidentally noted. Mediastinum/Nodes: No masses or pathologically enlarged lymph nodes identified. Lungs/Pleura: Focal airspace disease seen in the right upper  lobe, likely due to pneumonia. No evidence of central endobronchial obstruction or pleural effusion. Musculoskeletal: No suspicious bone lesions identified. Review of the MIP images confirms the above findings. CT ABDOMEN and PELVIS FINDINGS Hepatobiliary: No hepatic masses identified. Small benign cyst seen near junction of right and left hepatic lobes. Gallbladder is unremarkable. No evidence of biliary ductal dilatation. Pancreas:  No mass or inflammatory changes. Spleen: Within normal limits in size and  appearance. Adrenals/Urinary Tract: No masses identified. No evidence of ureteral calculi or hydronephrosis. Stomach/Bowel: No evidence of obstruction, inflammatory process or abnormal fluid collections. Vascular/Lymphatic: No pathologically enlarged lymph nodes. No acute vascular findings. Reproductive: Prior hysterectomy noted. Adnexal regions are unremarkable in appearance. Other:  None. Musculoskeletal:  No suspicious bone lesions identified. Review of the MIP images confirms the above findings. IMPRESSION: No evidence of pulmonary embolism. Focal airspace disease in right upper lobe, likely due to pneumonia. No acute findings within the abdomen or pelvis. Aortic Atherosclerosis (ICD10-I70.0). Electronically Signed   By: Marlaine Hind M.D.   On: 01/09/2022 17:14   CT ABDOMEN PELVIS W CONTRAST  Result Date: 01/09/2022 CLINICAL DATA:  Dyspnea. Tachycardia. High clinical probability for pulmonary embolism. Abdominal pain. EXAM: CT ANGIOGRAPHY CHEST CT ABDOMEN AND PELVIS WITH CONTRAST TECHNIQUE: Multidetector CT imaging of the chest was performed using the standard protocol during bolus administration of intravenous contrast. Multiplanar CT image reconstructions and MIPs were obtained to evaluate the vascular anatomy. Multidetector CT imaging of the abdomen and pelvis was performed using the standard protocol during bolus administration of intravenous contrast. RADIATION DOSE REDUCTION: This exam was performed  according to the departmental dose-optimization program which includes automated exposure control, adjustment of the mA and/or kV according to patient size and/or use of iterative reconstruction technique. CONTRAST:  139m OMNIPAQUE IOHEXOL 350 MG/ML SOLN COMPARISON:  None Available. FINDINGS: CTA CHEST FINDINGS Cardiovascular: Satisfactory opacification of pulmonary arteries noted, and no pulmonary emboli identified. Moderate cardiomegaly noted. Aortic and coronary atherosclerotic calcification incidentally noted. Mediastinum/Nodes: No masses or pathologically enlarged lymph nodes identified. Lungs/Pleura: Focal airspace disease seen in the right upper lobe, likely due to pneumonia. No evidence of central endobronchial obstruction or pleural effusion. Musculoskeletal: No suspicious bone lesions identified. Review of the MIP images confirms the above findings. CT ABDOMEN and PELVIS FINDINGS Hepatobiliary: No hepatic masses identified. Small benign cyst seen near junction of right and left hepatic lobes. Gallbladder is unremarkable. No evidence of biliary ductal dilatation. Pancreas:  No mass or inflammatory changes. Spleen: Within normal limits in size and appearance. Adrenals/Urinary Tract: No masses identified. No evidence of ureteral calculi or hydronephrosis. Stomach/Bowel: No evidence of obstruction, inflammatory process or abnormal fluid collections. Vascular/Lymphatic: No pathologically enlarged lymph nodes. No acute vascular findings. Reproductive: Prior hysterectomy noted. Adnexal regions are unremarkable in appearance. Other:  None. Musculoskeletal:  No suspicious bone lesions identified. Review of the MIP images confirms the above findings. IMPRESSION: No evidence of pulmonary embolism. Focal airspace disease in right upper lobe, likely due to pneumonia. No acute findings within the abdomen or pelvis. Aortic Atherosclerosis (ICD10-I70.0). Electronically Signed   By: JMarlaine HindM.D.   On: 01/09/2022  17:14   [place]KG SITE RITE  Result Date: 01/10/2022 If Site Rite image not attached, placement could not be confirmed due to current cardiac rhythm.   Cardiac Studies   Cardiac MRI EF left ventricular 17%.,  Moderate mitral regurgitation moderate to severe tricuspid regurgitation.  Moderate pericardial effusion.  Patient Profile     61y.o. female with hypotension, presumed sepsis, chronic systolic heart failure EF 20% nonischemic cardiomyopathy seen in advanced heart failure clinic  Assessment & Plan    Ventricular tachycardia - On both amiodarone as well as mexiletine -No evidence of nonsustained ventricular tachycardia on telemetry.  Occasional PVCs noted.  Previously had high PVC burden as outpatient.  On mexiletine.  Paroxysmal atrial fibrillation - On Eliquis, amiodarone, mexiletine -Currently in sinus rhythm 98 bpm on original EKG.  No evidence of atrial fibrillation.  Right upper lobe pneumonia - Treated with ceftriaxone and doxycycline -White count improved, lactic acidosis improved -Fever has resolved, no further chills.  As was seen on admission.  Procalcitonin low.  Hypotension, chronic systolic heart failure - No evidence of pulmonary embolism.  On CT. -Felt to be septic originally.  Stabilized with fluid resuscitation.  Also could have degree of cardiogenic shock as well.  Diuresis on hold on admission, spironolactone on hold.  Previous right heart cath have shown normal RV.  Previous wedge pressure 30 markedly elevated. -Co. ox from PICC line 60.,  Not significantly elevated as would be seen in the setting of sepsis.  Back on 12/21/2021 Co. ox was 76.  Prior to that in 2020 Co. ox has been mostly in the 52s. -Prior discharge weight 126 pounds.  She was taking Lasix 60 mg in the morning 40 mg in the evening -BP currently improved 116/61.  Back to baseline.  She will likely need for p.o. Lasix perhaps at slightly lower dose tomorrow.  Left-sided breast  cancer Adenocarcinoma of cervix -Followed by [facility], hysterectomy  I think it is reasonable for her to go home.  She looks well currently.  Instead of 60/40 of Lasix, lets place her on 40 mg daily with continue daily weights.  Discussed with Dr. Loleta Books.  Has close follow-up with advanced heart failure clinic.  She may need further increase in Lasix once again.   For questions or updates, please contact [place] Please consult [email or web address] for contact info under        Signed, Candee Furbish, MD  01/11/2022, 8:06 AM

## 2022-01-11 NOTE — Discharge Summary (Signed)
Physician Discharge Summary   Patient: Megan Keith MRN: [id number] DOB: June 12, 1961  Admit date:     01/09/2022  Discharge date: 01/11/22  Discharge Physician: Edwin Dada   PCP: Earna Coder, NP     Recommendations at discharge:  Complete 5 days antibiotics for pneumonia with cefpodoxime/doxycycline Follow up with PCP [facility] [place] in 1 week Reduce furosemide to 40 mg daily, weigh daily Follow up with Advanced Heart Failure service in 2 weeks at previously scheduled appointment [facility] [employer]: Please obtain BMP in 1 week     Discharge Diagnoses: Principal Problem:   Severe sepsis due to community acquired pneumonia of both lower lobes Active Problems:   CAP (community acquired pneumonia)   Paroxysmal atrial fibrillation (HCC)   Depression   Breast cancer ([place])   Chronic combined systolic and diastolic CHF (congestive heart failure) ([place])   Pulmonary HTN [facility])       Hospital Course: [name] is a 61 y.o. F spanish-speaking, hx NICM and sdCHF EF 20%, recently admitted for CHF 2 weeks ago and diuresed, also hyperthyroidism from amio, no longer on Tapazole, pAF on Eliquis, pHTN, BrCA and cervical CA who presnted with fever, cough, SOB, sputum.  In the ER, CT chest ruled out PE, showed bilateral lower lobe pneumonia.         * Severe sepsis due to community acquired pneumonia of both lower lobes Presented with tachypnea, fever, and and 40 pt drop in BP from baseline 110s and lactate >3.    She was given fluids and antibiotics and improved.  She had a PICC placed and was evaluated by Cardiology and CCM, noted to have good perfusion, cleared lactate and ScvO2 <70%, appeared hypovolemic.  Septic shock ruled out.  With fluids resuscitation and antibiotics (2L total fluids and Rocephin/doxy) she improved.  Today, Patient now mentating at baseline, taking orals.  Temp < 100 F, heart rate < 100bpm, RR < 24, SpO2 at baseline.   Stable for discharge.                 The [facility] Controlled Substances Registry was reviewed for this patient prior to discharge.   Consultants: Critical Care, Cardiology Procedures performed:  CTA chest Disposition: Home Diet recommendation:  Discharge Diet Orders (From admission, onward)     Start     Ordered   01/11/22 0000  Diet - low sodium heart healthy        01/11/22 1148             DISCHARGE MEDICATION: Allergies as of 01/11/2022   No Known Allergies      Medication List     TAKE these medications    acetaminophen 500 MG tablet Commonly known as: TYLENOL Take 500 mg by mouth every 6 (six) hours as needed for moderate pain or headache.   amiodarone 200 MG tablet Commonly known as: PACERONE TAKE 1 Tablet BY MOUTH ONCE EVERY DAY   anastrozole 1 MG tablet Commonly known as: ARIMIDEX Take 1 tablet (1 mg total) by mouth every evening.   apixaban 5 MG Tabs tablet Commonly known as: ELIQUIS Take 1 tablet (5 mg total) by mouth 2 (two) times daily.   cefpodoxime 200 MG tablet Commonly known as: VANTIN Take 1 tablet (200 mg total) by mouth 2 (two) times daily.   doxycycline 100 MG tablet Commonly known as: VIBRA-TABS Take 1 tablet (100 mg total) by mouth every 12 (twelve) hours.   furosemide 40 MG tablet Commonly known  as: LASIX Tome 1 tableta (40 mg en total) por va oral diariamente. (Take 1 tablet (40 mg total) by mouth daily.) What changed:  medication strength See the new instructions.   mexiletine 150 MG capsule Commonly known as: MEXITIL Take 2 capsules (300 mg total) by mouth every 12 (twelve) hours.   omeprazole 20 MG capsule Commonly known as: PRILOSEC Take 20 mg by mouth daily.   SM Mucus Relief 600 MG 12 hr tablet Generic drug: guaiFENesin Take 2 tablets (1,200 mg total) by mouth 2 (two) times daily as needed for to loosen phlegm.   spironolactone 25 MG tablet Commonly known as: ALDACTONE Tome 1 tableta (25 mg en total) por va oral  diariamente. (Take 1 tablet (25 mg total) by mouth daily.)        Follow-up Information     Earna Coder, NP. Schedule an appointment as soon as possible for a visit in 1 week(s).   Specialty: Family Medicine Contact information: [address] [place] [address] [phone]                 Discharge Instructions     Diet - low sodium heart healthy   Complete by: As directed    Discharge instructions   Complete by: As directed    From Dr. Loleta Books: You were admitted for pneumonia Here, you had a fever, you were coughing up new mucus, and the CT scan of your chest (the big x-ray machine) showed a pneumonia.  You were treated with antibiotics and got a lot better. Finish the course of antibiotics with: Doxycycline 100 mg twice daily (starting tonight) Cefpodoxime 200 mg twice daily (starting tomorrow morning, Tuesday)  Take them until they are gone, four more days  For the cough: Take Mucinex as needed (or another over-the-counter cough product containing guiafenesin, you can find these at any drug store)  Take the Mucinex pills up to twice per day, they will help to loosen mucus  If the cough is bothersome, take the Tessalon perles These will suppress the cough  With time, the cough will get better over the next 4-5 weeks    Go see your heart doctor Dr. Missy Sabins in 2 weeks on June 5 at [place] (see below in the To Do section)  Go call your primary care doctor to be seen in 1 week to make sure you are doing okay)  ONE OTHER IMPORTANT CHANGE: Keep taking your furosemide, but LOWER the dose to 40 mg daily for now Weigh yourself daily and call Dr. Missy Sabins if you gain MORE than 5 lbs   Increase activity slowly   Complete by: As directed        Discharge Exam: Filed Weights   01/10/22 0610  Weight: 59.2 kg    General: Pt is alert, awake, not in acute distress Cardiovascular: RRR, nl S1-S2, no murmurs appreciated.   No LE edema.    Respiratory: Normal respiratory rate and rhythm.  CTAB without rales or wheezes. Abdominal: Abdomen soft and non-tender.  No distension or HSM.   Neuro/Psych: Strength symmetric in upper and lower extremities.  Judgment and insight appear normal through videophonic interpreter   Condition at discharge: good  The results of significant diagnostics from this hospitalization (including imaging, microbiology, ancillary and laboratory) are listed below for reference.   Imaging Studies: DG Chest 2 View  Result Date: 01/09/2022 CLINICAL DATA:  Shortness of breath, chest pain, cough EXAM: CHEST - 2 VIEW COMPARISON:  Previous studies including  the examination of 12/17/2021 FINDINGS: Transverse diameter of heart is increased. There is prominence of interstitial markings in the parahilar regions. There is no focal consolidation. There are no signs of alveolar pulmonary edema. There is no pleural effusion or pneumothorax. IMPRESSION: Cardiomegaly. There is prominence of interstitial markings in the parahilar regions suggesting mild interstitial edema or interstitial pneumonia. There is no focal pulmonary consolidation. There is no pleural effusion or pneumothorax. Electronically Signed   By: Elmer Picker M.D.   On: 01/09/2022 15:02   DG Chest 2 View  Result Date: 12/17/2021 CLINICAL DATA:  Shortness of breath EXAM: CHEST - 2 VIEW COMPARISON:  Correlation is made to CT of the chest dated December 10, 2021 and chest x-ray dated March 04, 2021 FINDINGS: There is moderate cardiomegaly. There is mild pulmonary vascular congestion. No focal consolidation or pleural effusion seen. Bony thorax is unremarkable. IMPRESSION: Moderate cardiomegaly and mild pulmonary vascular congestion Electronically Signed   By: Frazier Richards M.D.   On: 12/17/2021 16:34   DG Knee 1-2 Views Right  Result Date: 01/09/2022 CLINICAL DATA:  Left knee pain.  Patient reports a fall 3 years ago. EXAM: RIGHT KNEE - 1-2 VIEW COMPARISON:   None Available. FINDINGS: No evidence of acute fracture or dislocation. There is a trace joint effusion. There is chronic appearing depression of the lateral tibial plateau with subchondral sclerosis and apex medial angulation of the knee joint. IMPRESSION: 1. Chronic appearing depression of the lateral tibial plateau and apex medial angulation of the knee joint. 2. Trace joint effusion. Electronically Signed   By: Zerita Boers M.D.   On: 01/09/2022 18:39   CT Angio Chest PE W and/or Wo Contrast  Result Date: 01/09/2022 CLINICAL DATA:  Dyspnea. Tachycardia. High clinical probability for pulmonary embolism. Abdominal pain. EXAM: CT ANGIOGRAPHY CHEST CT ABDOMEN AND PELVIS WITH CONTRAST TECHNIQUE: Multidetector CT imaging of the chest was performed using the standard protocol during bolus administration of intravenous contrast. Multiplanar CT image reconstructions and MIPs were obtained to evaluate the vascular anatomy. Multidetector CT imaging of the abdomen and pelvis was performed using the standard protocol during bolus administration of intravenous contrast. RADIATION DOSE REDUCTION: This exam was performed according to the departmental dose-optimization program which includes automated exposure control, adjustment of the mA and/or kV according to patient size and/or use of iterative reconstruction technique. CONTRAST:  159m OMNIPAQUE IOHEXOL 350 MG/ML SOLN COMPARISON:  None Available. FINDINGS: CTA CHEST FINDINGS Cardiovascular: Satisfactory opacification of pulmonary arteries noted, and no pulmonary emboli identified. Moderate cardiomegaly noted. Aortic and coronary atherosclerotic calcification incidentally noted. Mediastinum/Nodes: No masses or pathologically enlarged lymph nodes identified. Lungs/Pleura: Focal airspace disease seen in the right upper lobe, likely due to pneumonia. No evidence of central endobronchial obstruction or pleural effusion. Musculoskeletal: No suspicious bone lesions  identified. Review of the MIP images confirms the above findings. CT ABDOMEN and PELVIS FINDINGS Hepatobiliary: No hepatic masses identified. Small benign cyst seen near junction of right and left hepatic lobes. Gallbladder is unremarkable. No evidence of biliary ductal dilatation. Pancreas:  No mass or inflammatory changes. Spleen: Within normal limits in size and appearance. Adrenals/Urinary Tract: No masses identified. No evidence of ureteral calculi or hydronephrosis. Stomach/Bowel: No evidence of obstruction, inflammatory process or abnormal fluid collections. Vascular/Lymphatic: No pathologically enlarged lymph nodes. No acute vascular findings. Reproductive: Prior hysterectomy noted. Adnexal regions are unremarkable in appearance. Other:  None. Musculoskeletal:  No suspicious bone lesions identified. Review of the MIP images confirms the above findings. IMPRESSION: No evidence  of pulmonary embolism. Focal airspace disease in right upper lobe, likely due to pneumonia. No acute findings within the abdomen or pelvis. Aortic Atherosclerosis (ICD10-I70.0). Electronically Signed   By: Marlaine Hind M.D.   On: 01/09/2022 17:14   CT ABDOMEN PELVIS W CONTRAST  Result Date: 01/09/2022 CLINICAL DATA:  Dyspnea. Tachycardia. High clinical probability for pulmonary embolism. Abdominal pain. EXAM: CT ANGIOGRAPHY CHEST CT ABDOMEN AND PELVIS WITH CONTRAST TECHNIQUE: Multidetector CT imaging of the chest was performed using the standard protocol during bolus administration of intravenous contrast. Multiplanar CT image reconstructions and MIPs were obtained to evaluate the vascular anatomy. Multidetector CT imaging of the abdomen and pelvis was performed using the standard protocol during bolus administration of intravenous contrast. RADIATION DOSE REDUCTION: This exam was performed according to the departmental dose-optimization program which includes automated exposure control, adjustment of the mA and/or kV according to  patient size and/or use of iterative reconstruction technique. CONTRAST:  180m OMNIPAQUE IOHEXOL 350 MG/ML SOLN COMPARISON:  None Available. FINDINGS: CTA CHEST FINDINGS Cardiovascular: Satisfactory opacification of pulmonary arteries noted, and no pulmonary emboli identified. Moderate cardiomegaly noted. Aortic and coronary atherosclerotic calcification incidentally noted. Mediastinum/Nodes: No masses or pathologically enlarged lymph nodes identified. Lungs/Pleura: Focal airspace disease seen in the right upper lobe, likely due to pneumonia. No evidence of central endobronchial obstruction or pleural effusion. Musculoskeletal: No suspicious bone lesions identified. Review of the MIP images confirms the above findings. CT ABDOMEN and PELVIS FINDINGS Hepatobiliary: No hepatic masses identified. Small benign cyst seen near junction of right and left hepatic lobes. Gallbladder is unremarkable. No evidence of biliary ductal dilatation. Pancreas:  No mass or inflammatory changes. Spleen: Within normal limits in size and appearance. Adrenals/Urinary Tract: No masses identified. No evidence of ureteral calculi or hydronephrosis. Stomach/Bowel: No evidence of obstruction, inflammatory process or abnormal fluid collections. Vascular/Lymphatic: No pathologically enlarged lymph nodes. No acute vascular findings. Reproductive: Prior hysterectomy noted. Adnexal regions are unremarkable in appearance. Other:  None. Musculoskeletal:  No suspicious bone lesions identified. Review of the MIP images confirms the above findings. IMPRESSION: No evidence of pulmonary embolism. Focal airspace disease in right upper lobe, likely due to pneumonia. No acute findings within the abdomen or pelvis. Aortic Atherosclerosis (ICD10-I70.0). Electronically Signed   By: JMarlaine HindM.D.   On: 01/09/2022 17:14   CARDIAC CATHETERIZATION  Result Date: 12/21/2021 Findings: RA = 4 RV = 59/7 PA = 62/30 (42) PCW = 30 (v waves to 35-40) Fick cardiac  output/index = 4.7/3.1 PVR = 2.6 WU Ao sat = 99% PA sat = 74%, 75% SVC sat = 76% PAPi = 8.0 Assessment: 1. Normal cardiac output with markedly elevated left-sided filling pressures. Plan/Discussion: Continue diuresis. DGlori Bickers MD 7:24 PM  MR CARDIAC MORPHOLOGY W WO CONTRAST  Result Date: 12/21/2021 CLINICAL DATA:  20F with NICM, EF 20% on echo EXAM: CARDIAC MRI TECHNIQUE: The patient was scanned on a 1.5 Tesla Siemens magnet. A dedicated cardiac coil was used. Functional imaging was done using Fiesta sequences. 2,3, and 4 chamber views were done to assess for RWMA's. Modified Simpson's rule using a short axis stack was used to calculate an ejection fraction on a dedicated work sConservation officer, nature The patient received 8 cc of Gadavist. After 10 minutes inversion recovery sequences were used to assess for infiltration and scar tissue. CONTRAST:  8 cc  of Gadavist FINDINGS: Left ventricle: -Severe dilatation -Severe systolic dysfunction -ECV elevation (38%) -Lateral wall midwall LGE -RV insertion site LGE LV EF:  17% (  Normal 56-78%) Absolute volumes: LV EDV: 267m (Normal 52-141 mL) LV ESV: 1775m(Normal 13-51 mL) LV SV: 3741mNormal 33-97 mL) CO: 3.4L/min (Normal 2.7-6.0 L/min) Indexed volumes: LV EDV: 140m25m-m (Normal 41-81 mL/sq-m) LV ESV: 116mL19mm (Normal 12-21 mL/sq-m) LV SV: 24mL/63m (Normal 26-56 mL/sq-m) CI: 2.2L/min/sq-m (Normal 1.8-3.8 L/min/sq-m) Right ventricle: Mild dilatation with moderate systolic dysfunction RV EF: 30% (Normal 47-80%) Absolute volumes: RV EDV: 145mL (40mal 58-154 mL) RV ESV: 101mL (N53ml 12-68 mL) RV SV: 44mL (No81m 35-98 mL) CO: 4.0L/min (Normal 2.7-6 L/min) Indexed volumes: RV EDV: 95mL/sq-m31mrmal 48-87 mL/sq-m) RV ESV: 66mL/sq-m 67mmal 11-28 mL/sq-m) RV SV: 29mL/sq-m (75mal 27-57 mL/sq-m) CI: 2.7L/min/sq-m (Normal 1.8-3.8 L/min/sq-m) Left atrium: Severe enlargement Right atrium: Mild enlargement Mitral valve: Moderate regurgitation (regurgitant  fraction 32%) Aortic valve: Trivial regurgitation Tricuspid valve: Moderate to severe regurgitation Pulmonic valve: Trivial regurgitation Aorta: Normal proximal ascending aorta Pericardium: Moderate effusion measuring up to 12mm adjacen41m LV lateral wall IMPRESSION: 1.  Severe LV dilatation with severe systolic dysfunction (EF 17%) 2.  Mild48% dilatation with moderate systolic dysfunction (EF 30%) 3. Midwa25%LGE in LV lateral wall, which is a scar pattern from nonischemic etiology such as prior myocarditis 4. RV insertion site LGE, which is a nonspecific scar pattern often seen in setting of elevated pulmonary pressures 5.  Moderate pericardial effusion 6.  Moderate mitral regurgitation (regurgitant fraction 32%) 7. Moderate to severe tricuspid regurgitation (was not quantified) Electronically Signed   By: Amenda Duclos  Oswaldo Milian5/08/2021 22:50   US EKG SITE RKoreaE  Result Date: 01/10/2022 If Site Rite image not attached, placement could not be confirmed due to current cardiac rhythm.  US Abdomen LiKoreated RUQ (LIVER/GB)  Result Date: 12/17/2021 CLINICAL DATA:  Epigastric pain EXAM: ULTRASOUND ABDOMEN LIMITED RIGHT UPPER QUADRANT COMPARISON:  None. FINDINGS: Gallbladder: No gallstones or wall thickening visualized. No sonographic Murphy sign noted by sonographer. Common bile duct: Diameter: Normal caliber, 5 mm Liver: Heterogeneous echotexture throughout the liver. Subtle nodularity to the liver surface suggests the possibility of cirrhosis. No focal hepatic abnormality. Portal vein is patent on color Doppler imaging with normal direction of blood flow towards the liver. Other: None. IMPRESSION: No cholelithiasis or acute cholecystitis. Heterogeneous echotexture and subtle nodularity to the liver surface suggests the possibility of cirrhosis. Recommend clinical correlation. Electronically Signed   By: Kevin  Dover Rolm Baptise4/27/2023 20:00    Microbiology: Results for orders placed or performed  during the hospital encounter of 01/09/22  Resp Panel by RT-PCR (Flu A&B, Covid) Nasopharyngeal Swab     Status: None   Collection Time: 01/09/22  3:08 PM   Specimen: Nasopharyngeal Swab; Nasopharyngeal(NP) swabs in vial transport medium  Result Value Ref Range Status   SARS Coronavirus 2 by RT PCR NEGATIVE NEGATIVE Final    Comment: (NOTE) SARS-CoV-2 target nucleic acids are NOT DETECTED.  The SARS-CoV-2 RNA is generally detectable in upper respiratory specimens during the acute phase of infection. The lowest concentration of SARS-CoV-2 viral copies this assay can detect is 138 copies/mL. A negative result does not preclude SARS-Cov-2 infection and should not be used as the sole basis for treatment or other patient management decisions. A negative result may occur with  improper specimen collection/handling, submission of specimen other than nasopharyngeal swab, presence of viral mutation(s) within the areas targeted by this assay, and inadequate number of viral copies(<138 copies/mL). A negative result must be combined with clinical observations, patient history, and epidemiological information. The expected result is Negative.  Fact Sheet for Patients:  EntrepreneurPulse.com.au  Fact Sheet for Healthcare Providers:  IncredibleEmployment.be  This test is no t yet approved or cleared by the [place] FDA and  has been authorized for detection and/or diagnosis of SARS-CoV-2 by FDA under an Emergency Use Authorization (EUA). This EUA will remain  in effect (meaning this test can be used) for the duration of the COVID-19 declaration under Section 564(b)(1) of the Act, 21 U.S.C.section 360bbb-3(b)(1), unless the authorization is terminated  or revoked sooner.       Influenza A by PCR NEGATIVE NEGATIVE Final   Influenza B by PCR NEGATIVE NEGATIVE Final    Comment: (NOTE) The Xpert Xpress SARS-CoV-2/FLU/RSV plus assay is intended as an  aid in the diagnosis of influenza from Nasopharyngeal swab specimens and should not be used as a sole basis for treatment. Nasal washings and aspirates are unacceptable for Xpert Xpress SARS-CoV-2/FLU/RSV testing.  Fact Sheet for Patients: EntrepreneurPulse.com.au  Fact Sheet for Healthcare Providers: IncredibleEmployment.be  This test is not yet approved or cleared by the [place] FDA and has been authorized for detection and/or diagnosis of SARS-CoV-2 by FDA under an Emergency Use Authorization (EUA). This EUA will remain in effect (meaning this test can be used) for the duration of the COVID-19 declaration under Section 564(b)(1) of the Act, 21 U.S.C. section 360bbb-3(b)(1), unless the authorization is terminated or revoked.  Performed at [facility], [place] [address] [place], [place] [address]   Culture, blood (routine x 2)     Status: None (Preliminary result)   Collection Time: 01/09/22  4:50 PM   Specimen: BLOOD RIGHT ARM  Result Value Ref Range Status   Specimen Description BLOOD RIGHT ARM  Final   Special Requests   Final    BOTTLES DRAWN AEROBIC AND ANAEROBIC Blood Culture adequate volume   Culture   Final    NO GROWTH 2 DAYS Performed at [facility], [place] [address], [place], [place] [address]    Report Status PENDING  Incomplete  MRSA Next Gen by PCR, Nasal     Status: None   Collection Time: 01/10/22  3:47 AM   Specimen: Nasal Mucosa; Nasal Swab  Result Value Ref Range Status   MRSA by PCR Next Gen NOT DETECTED NOT DETECTED Final    Comment: (NOTE) The GeneXpert MRSA Assay (FDA approved for NASAL specimens only), is one component of a comprehensive MRSA colonization surveillance program. It is not intended to diagnose MRSA infection nor to guide or monitor treatment for MRSA infections. Test performance is not FDA approved in patients less than 42 years old. Performed at [facility], [place] [address], [place], [place] [address]   Culture, blood (routine x 2)     Status: None (Preliminary result)   Collection Time: 01/10/22  5:27 AM   Specimen: BLOOD RIGHT HAND  Result Value Ref Range Status   Specimen Description BLOOD RIGHT HAND  Final   Special Requests   Final    BOTTLES DRAWN AEROBIC AND ANAEROBIC Blood Culture adequate volume   Culture   Final    NO GROWTH 1 DAY Performed at [facility], [place] [address] [place], [place] [address]    Report Status PENDING  Incomplete    Labs: CBC: Recent Labs  Lab 01/09/22 1420 01/10/22 0519 01/11/22 0045  WBC 11.8* 9.1 6.4  NEUTROABS 10.3*  --   --   HGB 13.6 11.7* 11.9*  HCT 43.0 35.7* 36.8  MCV 87.2 86.4 88.7  PLT 282 231 382   Basic Metabolic Panel: Recent Labs  Lab 01/09/22 1420 01/10/22 0519 01/11/22 0045  NA 135  --  137  K 3.5  --  3.5  CL 103  --  114*  CO2 21*  --  19*  GLUCOSE 111*  --  105*  BUN 12  --  9  CREATININE 0.88  --  0.77  CALCIUM 9.3  --  8.5*  MG  --  2.1  --    Liver Function Tests: Recent Labs  Lab 01/09/22 1550  AST 30  ALT 30  ALKPHOS 104  BILITOT 0.8  PROT 7.7  ALBUMIN 4.2   CBG: No results for input(s): GLUCAP in the last 168 hours.  Discharge time spent: approximately 40 minutes spent on discharge counseling, evaluation of patient on day of discharge, and coordination of discharge planning with nursing, social work, pharmacy and case management  Signed: Edwin Dada, MD [facility] 01/11/2022

## 2022-01-11 NOTE — Plan of Care (Signed)
?  Problem: Education: ?Goal: Knowledge of disease or condition will improve ?Outcome: Adequate for Discharge ?Goal: Knowledge of the prescribed therapeutic regimen will improve ?Outcome: Adequate for Discharge ?Goal: Individualized Educational Video(s) ?Outcome: Adequate for Discharge ?  ?Problem: Activity: ?Goal: Ability to tolerate increased activity will improve ?Outcome: Adequate for Discharge ?Goal: Will verbalize the importance of balancing activity with adequate rest periods ?Outcome: Adequate for Discharge ?  ?Problem: Respiratory: ?Goal: Ability to maintain a clear airway will improve ?Outcome: Adequate for Discharge ?Goal: Levels of oxygenation will improve ?Outcome: Adequate for Discharge ?Goal: Ability to maintain adequate ventilation will improve ?Outcome: Adequate for Discharge ?  ?Problem: Activity: ?Goal: Ability to tolerate increased activity will improve ?Outcome: Adequate for Discharge ?  ?Problem: Clinical Measurements: ?Goal: Ability to maintain a body temperature in the normal range will improve ?Outcome: Adequate for Discharge ?  ?Problem: Respiratory: ?Goal: Ability to maintain adequate ventilation will improve ?Outcome: Adequate for Discharge ?Goal: Ability to maintain a clear airway will improve ?Outcome: Adequate for Discharge ?  ?

## 2022-01-12 LAB — RESPIRATORY PANEL BY PCR

## 2022-01-14 LAB — CULTURE, BLOOD (ROUTINE X 2)
Culture: NO GROWTH
Special Requests: ADEQUATE

## 2022-01-15 LAB — CULTURE, BLOOD (ROUTINE X 2)
Culture: NO GROWTH
Special Requests: ADEQUATE

## 2022-01-25 ENCOUNTER — Ambulatory Visit (HOSPITAL_COMMUNITY)
Admission: RE | Admit: 2022-01-25 | Discharge: 2022-01-25 | Disposition: A | Discharge status: Home / Self Care | Payer: Self-pay | Source: Ambulatory Visit | Attending: Family Medicine | Admitting: Family Medicine

## 2022-01-25 ENCOUNTER — Encounter (HOSPITAL_COMMUNITY): Payer: Self-pay

## 2022-01-25 ENCOUNTER — Other Ambulatory Visit: Payer: Self-pay | Admitting: Internal Medicine

## 2022-01-25 VITALS — BP 102/78 | HR 81 | Wt 131.6 lb

## 2022-01-25 DIAGNOSIS — I471 Supraventricular tachycardia: Secondary | ICD-10-CM | POA: Insufficient documentation

## 2022-01-25 DIAGNOSIS — Z8616 Personal history of COVID-19: Secondary | ICD-10-CM | POA: Insufficient documentation

## 2022-01-25 DIAGNOSIS — E064 Drug-induced thyroiditis: Secondary | ICD-10-CM

## 2022-01-25 DIAGNOSIS — C539 Malignant neoplasm of cervix uteri, unspecified: Secondary | ICD-10-CM

## 2022-01-25 DIAGNOSIS — I493 Ventricular premature depolarization: Secondary | ICD-10-CM

## 2022-01-25 DIAGNOSIS — I081 Rheumatic disorders of both mitral and tricuspid valves: Secondary | ICD-10-CM | POA: Insufficient documentation

## 2022-01-25 DIAGNOSIS — I5022 Chronic systolic (congestive) heart failure: Secondary | ICD-10-CM | POA: Insufficient documentation

## 2022-01-25 DIAGNOSIS — Z923 Personal history of irradiation: Secondary | ICD-10-CM | POA: Insufficient documentation

## 2022-01-25 DIAGNOSIS — Z853 Personal history of malignant neoplasm of breast: Secondary | ICD-10-CM | POA: Insufficient documentation

## 2022-01-25 DIAGNOSIS — I480 Paroxysmal atrial fibrillation: Secondary | ICD-10-CM | POA: Insufficient documentation

## 2022-01-25 DIAGNOSIS — Z7901 Long term (current) use of anticoagulants: Secondary | ICD-10-CM | POA: Insufficient documentation

## 2022-01-25 DIAGNOSIS — Z8541 Personal history of malignant neoplasm of cervix uteri: Secondary | ICD-10-CM | POA: Insufficient documentation

## 2022-01-25 DIAGNOSIS — I5042 Chronic combined systolic (congestive) and diastolic (congestive) heart failure: Secondary | ICD-10-CM

## 2022-01-25 DIAGNOSIS — I428 Other cardiomyopathies: Secondary | ICD-10-CM | POA: Insufficient documentation

## 2022-01-25 DIAGNOSIS — I340 Nonrheumatic mitral (valve) insufficiency: Secondary | ICD-10-CM

## 2022-01-25 DIAGNOSIS — E0590 Thyrotoxicosis, unspecified without thyrotoxic crisis or storm: Secondary | ICD-10-CM

## 2022-01-25 DIAGNOSIS — Z79899 Other long term (current) drug therapy: Secondary | ICD-10-CM | POA: Insufficient documentation

## 2022-01-25 LAB — BRAIN NATRIURETIC PEPTIDE: B Natriuretic Peptide: 1400.6 pg/mL — ABNORMAL HIGH (ref 0.0–100.0)

## 2022-01-25 LAB — BASIC METABOLIC PANEL
Anion gap: 6 (ref 5–15)
BUN: 12 mg/dL (ref 8–23)
CO2: 21 mmol/L — ABNORMAL LOW (ref 22–32)
Calcium: 9 mg/dL (ref 8.9–10.3)
Chloride: 111 mmol/L (ref 98–111)
Creatinine, Ser: 0.92 mg/dL (ref 0.44–1.00)
GFR, Estimated: >60 mL/min (ref >60)
Glucose, Bld: 110 mg/dL — ABNORMAL HIGH (ref 70–99)
Potassium: 3.7 mmol/L (ref 3.5–5.1)
Sodium: 138 mmol/L (ref 135–145)

## 2022-01-25 MED ORDER — FUROSEMIDE 40 MG PO TABS: 60.0000 mg | ORAL_TABLET | Freq: Every day | ORAL | 0 refills | Status: DC

## 2022-01-25 MED ORDER — MEXILETINE HCL 200 MG PO CAPS: 200.0000 mg | ORAL_CAPSULE | Freq: Two times a day (BID) | ORAL | 6 refills | Status: DC

## 2022-01-25 NOTE — Progress Notes (Signed)
Advanced [facility] Progress Note    PCP: Earna Coder, NP PCP-Cardiologist: Glori Bickers, MD   Reason for Visit: [facility] F/u for Systolic Heart Failure  HPI:  Megan Keith is a 61 y.o.female with chronic systolic CHF (Echo EF 92-11% in 03/2017)  PAF, cervical cancer, PVCs, and hyperthyroidisum.      Admitted to [facility] 8/18 with ADHF. Prior EF with Dr. [name] was 35%. Repeat echo EF 15%. Transferred to [facility]. R/LHC  no CAD and fick output/index 5.3/3.3. Noted to have >20% PVC on telemetry. Started on amiodarone which she susequently stopped.    Admitted 7/20 with ADHF with new-onset AF in the setting of hyperthyroidism. TEE with EF 10% RV ok. Started on amiodarone and diuresed. Underwent TEE/DC-CV and felt much better. Also treated with methimazole and prednisone.    Admitted 10/20 for COVID 19 infection c/b recurrent afib w/ RVR. Loaded on amio and converted.   Admitted from HF clinic 11/20 with A Fib RVR -> ADHF.Methimazole and prednisone continued for hyperthyroidism.  EP was consulted. Initial plans were for PVI ablation but procedure canceled after TEE showed severely reduced EF at 5% and severe LAE. Had DC-CV with conversion to NSR.  She was also treated w/ Losartan (BP too soft for Entesto), spironolactone and digoxin.    Saw EP 10/22/19 and was still in NSR. Discussed possible AF ablation. There was concern over degree of MR and LAE.    She finished treatment for hyperthyroidism with Dr. [name] 5/21.     Admitted [name] 9/21 She was reportedly treated for mild hemoptysis in setting of PNA and CHF. Treated w/ abx and lasix. Had low BP and was taken off losartan and Coreg.     11/21 High PVC burden so Mexitil 200 mg bid started.   Repeat Echo (4/22) EF 20-25%    Zio (5/22) (on mexiletine). Occasional PVCs, burden low at 3.3%. Also had 32 runs of SVT, the longest lasting 10 beats w/ average HR 104 bpm.    Diagnosed w/ cervical cancer  7/22.   Underwent RHC 03/12/21 elevated filling pressures and preserved CO (see below). She was given metolazone 2.5 x 2 days and lasix increased to 60 mg daily.   S/p total hysertectomy 05/08/21 for cervical cancer.  Lasix was held with low BP with instructions to restart when sBP>100.   Found to be in ectopic AT, amiodarone restarted 1/23. Follow up 2/23 breathing improved and weight was down. Amiodarone reduced.   Echo 2/23 showed EF 20%, severe LV dysfunction, grade III DD, RV moderately reduced, moderate MR, TR moderate to severe   Acute visit 10/27/21, volume mildly up, lasix stopped and torsemide 40 mg started. She was switched back to lasix because she had back pain.  Follow up 4/23, felt poorly. Was in atrial tach, arranged for DCCV.  Presented for planned procedure on 4/17 and was back in NSR. Procedure canceled.  Admitted 9/41/74 w/ a/c systolic heart failure and volume overload. She was in NSR. Diuresed w/ IV Lasix. RHC showed normal cardiac output w/ markedly elevated left-sided filling pressures (see data below). She was diuresed further w/ IV Lasix. CMRI showed severe LV dilation. EF 17%, mod RV dysfunction.  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   After diuresis w/ IV lasix transitioned to PO, 60 mg daily. Unable to tolerate ARB/ARNi due to hypotension. Digoxin discontinued given elevated level. Discharged home on 5/5. D/c wt 126  lb.   Follow up 5/23, feeling poorly. NYHA II-early III symptoms, mild volume overload and Lasix increased to 60/40.   Admitted 5/23 with CAP. Received IVF and abx. No changes to GDMT, discharged home, weight 130 lbs.  Today she returns for HF follow up with interpretor. Overall feeling fair. She is SOB walking on flat ground after 20 minutes, remains weak and fatigued from recent hospitalization for pneumonia. Continues to cough some. Says mexiletine makes her feel poorly (nausea and fatigue after she  takes it). She continues with dizziness but no falls. Legs are more swollen. Denies CP, palpitations, or PND/Orthopnea. Appetite ok. No fever or chills. Weight at home 130 pounds. Taking all medications.   Cardiac Studies:  - RHC 5/23 RA = 4 RV = 59/7 PA = 62/30 (42) PCW = 30 (v waves to 35-40) Fick cardiac output/index = 4.7/3.1 PVR = 2.6 WU Ao sat = 99% PA sat = 74%, 75% SVC sat = 76%   PAPi = 8.0   Assessment: 1. Normal cardiac output with markedly elevated left-sided filling pressures.  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 ? PVC cardiomyopathy. right and left heart catheterization on 04/15/17, no CAD. Fick output/index 5.28/3.34.   Noncompliance    a. episodic noncompliance.   Pneumonia 06/2016   Current Outpatient Medications  Medication Sig Dispense Refill   acetaminophen (TYLENOL) 500 MG tablet Take 500 mg by mouth every 6 (six) hours as needed for moderate pain or headache.     amiodarone (PACERONE) 200 MG tablet TAKE 1 Tablet BY MOUTH ONCE EVERY DAY 30 tablet 6   anastrozole (ARIMIDEX) 1 MG tablet Take 1 tablet (1 mg total) by mouth every evening. 90 tablet 3   apixaban (ELIQUIS) 5 MG TABS tablet Take 1 tablet (5 mg total) by mouth 2 (two) times daily. 60 tablet 3   furosemide (LASIX) 40 MG tablet Take 1 tablet (40 mg total) by mouth daily. 30 tablet 0   guaiFENesin (MUCINEX) 600 MG 12 hr tablet Take 2 tablets (1,200 mg total) by mouth 2 (two) times daily as needed for to loosen phlegm. 20 tablet 0   mexiletine  (MEXITIL) 150 MG capsule Take 2 capsules (300 mg total) by mouth every 12 (twelve) hours. 120 capsule 0   OMEPRAZOLE PO Take 20 mg by mouth daily as needed.     spironolactone (ALDACTONE) 25 MG tablet Take 1 tablet (25 mg total) by mouth daily. 30 tablet 0   No current facility-administered medications for this encounter.   No Known Allergies  Social History   Socioeconomic History   Marital status: Single    Spouse name: Not on file   Number of children: 4   Years of education: Not on file   Highest education level: Not on file  Occupational History   Not on file  Tobacco Use   Smoking status: Never   Smokeless tobacco: Never  Vaping Use   Vaping  Use: Never used  Substance and Sexual Activity   Alcohol use: Not Currently    Comment: 04/15/2017 "might have a couple beers q couple months"   Drug use: Not Currently   Sexual activity: Not Currently  Other Topics Concern   Not on file  Social History Narrative   ** Merged History Encounter **       N/A   Social Determinants of Health   Financial Resource Strain: Not on file  Food Insecurity: Not on file  Transportation Needs: Not on file  Physical Activity: Not on file  Stress: Not on file  Social Connections: Not on file  Intimate Partner Violence: Not on file   Family History  Problem Relation Age of Onset   Hypertension Mother    Arthritis Mother    Diabetes Father    Arthritis Sister    BP 102/78   Pulse 81   Wt 59.7 kg   SpO2 99%   BMI 28.48 kg/m   PHYSICAL EXAM: General:  NAD. No resp difficulty HEENT: Normal Neck: Supple.JVP 7-8. Carotids 2+ bilat; no bruits. No lymphadenopathy or thryomegaly appreciated. Cor: PMI nondisplaced. Regular rate & rhythm. No rubs, gallops or murmurs. Lungs: Rhonchi RLL Abdomen: Soft, nontender, nondistended. No hepatosplenomegaly. No bruits or masses. Good bowel sounds. Extremities: No cyanosis, clubbing, rash, 1+ BLE edema Neuro: Alert & oriented x 3, cranial nerves  grossly intact. Moves all 4 extremities w/o difficulty. Affect pleasant.  ECG (personally reviewed): NSR 86 bpm  ASSESSMENT & PLAN: 1. Chronic systolic HF: NICM, normal cors in 03/2017. 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  - Echo (03/2017):  EF 15-20%  - TEE (11/20): EF 5% in setting of AF - Echo (2/21): EF 30-35% - Echo (4/22): 20-25%  - RHC (7/22): markedly elevated filling pressures and preserved CO, prominent v-waves - Echo (2/23): EF 20%, severe LV dysfunction, grade III DD, RV moderately reduced, moderate MR, TR moderate to severe - Recent admit 4/23 with marked volume overload. - RHC 5/23 normal cardiac output, CI 3.1, PAPi 8.0  + markedly elevated left sided filling pressures - CMRI(5/23): Severe LV dilation. LVEF 17%, mod RV dysfunction, RVEF 30%.  Mid wall LGE in LV lateral wall suggestive of prior myocarditis, mod MR, and mod/severe TR.  - NYHA Class II-early III, functional status difficult in setting of recent PNA. She is mildly volume overloaded today. - Increase Lasix to 60 mg daily. OK to take extra 40 mg as needed. (Goal weight ~ 126 lbs). - Continue spiro 25 mg daily.  - Intolerant to Farxiga.  - No Entresto given soft BP and positional dizziness - No beta blocker with recent acute exacerbation - Digoxin d/c d/t elevated level - Have not proceeded with ICD/Barostim given no insurance and she is not a citizen.  - She is not a candidate for advanced therapies given immigration status. Her granddaughter reports she has applied for a [employer]. May take another couple of years before approved. - BMET today.   2. PAF/ectopic atrial tachycardia - S/p previous DC-CV in 7/20. 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   - Previously was on amio for PVCs but developed hyperthyroidism, treated w/ methimazole.  - 06/2019 recurrent AF w/ RVR. Loaded w/amio. Evaluated by EP. Initial plans were for PVI ablation but cancelled after TEE showed EF 5% and severe LAE. Pt underwent DCCV instead.    - Saw Dr. Rayann Heman again in 2/21. Planned for AF ablation but does not have insurance coverage for AF ablation.  - ECG  today shows NSR 86 bpm  - Continue medical treatment. No good options given lack of insurance  - Continue amiodarone 200 mg daily. - Continue Eliquis 5 bid. No  bleeding issues.   3. Moderate to severe mitral regurgitation - Prominent v-waves in PCWP tracing on RHC 12/21/21 similar to [place] 2022.  - Moderate on echo 2/23 and cMRI (5/23) - HF optimization per above    4. 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  - Per Dr. Jasmine December, no further intervention needed. - LFTs and TFTs recent hospitalization WNL    5. H/o Frequent PVCs - Zio patch (8/20) with 5% PVC burden; Zio patch (3 days)  -11/21 with 16% PVC burden. - Zio (5/22) w/ only occasional PVCs, burden 3.3% on amio + mexilitine. - No PVCs on ECG today  - With fatigue and nausea, decrease mexiletine to 200 mg bid to see if this helps. I asked her to call me if she notices palpitations resume - Continue amiodarone 200 mg daily. - Not candidate for ablation due to severe MR and LAE   6. Left Breast Cancer - Treated w/ XRT.   7. Adenocarcinoma in situ of cervix  - Followed by [facility]. - s/p hysertectomy.   Follow up in 2-3 months with Dr. Haroldine Laws.  Z[place]man, FNP 01/25/22

## 2022-01-25 NOTE — Patient Instructions (Signed)
Thank you for coming in today  Labs were done today, if any labs are abnormal the clinic will call you No news is good news  INCREASE Lasix to 60 mg daily can take a extra dose of 40 mg for For weight gain of 3 lbs in 24 hours or 5 lbs in a week  DECREASE Mexitil to 200 mg twice daily  Your physician recommends that you schedule a follow-up appointment in:  3 month with Dr. Haroldine Laws  You have been given a prescription for a blood pressure cuff  At the [facility], you and your health needs are our priority. As part of our continuing mission to provide you with exceptional heart care, we have created designated Provider Care Teams. These Care Teams include your primary Cardiologist (physician) and Advanced Practice Providers (APPs- Physician Assistants and Nurse Practitioners) who all work together to provide you with the care you need, when you need it.   You may see any of the following providers on your designated Care Team at your next follow up: Dr Glori Bickers Dr Haynes Kerns, NP Lyda Jester, [place] [facility] [place], [place] Audry Riles, PharmD   Please be sure to bring in all your medications bottles to every appointment.   If you have any questions or concerns before your next appointment please send [place] a message through [place] or call our office at [phone].    TO LEAVE A MESSAGE FOR THE NURSE SELECT OPTION 2, PLEASE LEAVE A MESSAGE INCLUDING: YOUR NAME DATE OF BIRTH CALL BACK NUMBER REASON FOR CALL**this is important as we prioritize the call backs  YOU WILL RECEIVE A CALL BACK THE SAME DAY AS LONG AS YOU CALL BEFORE 4:00 PM

## 2022-02-02 ENCOUNTER — Other Ambulatory Visit: Payer: Self-pay

## 2022-02-08 ENCOUNTER — Other Ambulatory Visit (HOSPITAL_COMMUNITY): Payer: Self-pay

## 2022-02-08 ENCOUNTER — Encounter: Payer: Self-pay | Admitting: Oncology

## 2022-02-11 ENCOUNTER — Other Ambulatory Visit (HOSPITAL_COMMUNITY): Payer: Self-pay | Admitting: *Deleted

## 2022-02-11 MED ORDER — MEXILETINE HCL 200 MG PO CAPS: 200.0000 mg | ORAL_CAPSULE | Freq: Two times a day (BID) | ORAL | 6 refills | Status: DC

## 2022-02-11 MED ORDER — SPIRONOLACTONE 25 MG PO TABS: 25.0000 mg | ORAL_TABLET | Freq: Every day | ORAL | 6 refills | Status: DC

## 2022-02-12 ENCOUNTER — Other Ambulatory Visit (HOSPITAL_BASED_OUTPATIENT_CLINIC_OR_DEPARTMENT_OTHER): Payer: Self-pay | Admitting: Family Medicine

## 2022-02-12 DIAGNOSIS — M79605 Pain in left leg: Secondary | ICD-10-CM

## 2022-02-15 ENCOUNTER — Other Ambulatory Visit (HOSPITAL_COMMUNITY): Payer: Self-pay

## 2022-02-15 ENCOUNTER — Ambulatory Visit (HOSPITAL_BASED_OUTPATIENT_CLINIC_OR_DEPARTMENT_OTHER): Admission: RE | Admit: 2022-02-15 | Payer: Self-pay | Source: Ambulatory Visit

## 2022-02-15 MED ORDER — MEXILETINE HCL 200 MG PO CAPS: 200.0000 mg | ORAL_CAPSULE | Freq: Two times a day (BID) | ORAL | 6 refills | Status: DC

## 2022-02-16 ENCOUNTER — Ambulatory Visit (HOSPITAL_BASED_OUTPATIENT_CLINIC_OR_DEPARTMENT_OTHER)
Admission: RE | Admit: 2022-02-16 | Discharge: 2022-02-16 | Disposition: A | Discharge status: Home / Self Care | Payer: Self-pay | Source: Ambulatory Visit | Attending: Family Medicine | Admitting: Family Medicine

## 2022-02-16 DIAGNOSIS — M79605 Pain in left leg: Secondary | ICD-10-CM | POA: Insufficient documentation

## 2022-02-17 ENCOUNTER — Emergency Department (HOSPITAL_COMMUNITY)
Admission: EM | Admit: 2022-02-17 | Discharge: 2022-02-18 | Disposition: A | Discharge status: Home / Self Care | Payer: Self-pay | Attending: Emergency Medicine | Admitting: Emergency Medicine

## 2022-02-17 ENCOUNTER — Emergency Department (HOSPITAL_COMMUNITY): Payer: Self-pay

## 2022-02-17 ENCOUNTER — Encounter (HOSPITAL_COMMUNITY): Payer: Self-pay | Admitting: Pharmacy Technician

## 2022-02-17 ENCOUNTER — Telehealth (HOSPITAL_COMMUNITY): Payer: Self-pay | Admitting: *Deleted

## 2022-02-17 DIAGNOSIS — Z7901 Long term (current) use of anticoagulants: Secondary | ICD-10-CM | POA: Insufficient documentation

## 2022-02-17 DIAGNOSIS — K529 Noninfective gastroenteritis and colitis, unspecified: Secondary | ICD-10-CM | POA: Insufficient documentation

## 2022-02-17 LAB — COMPREHENSIVE METABOLIC PANEL
ALT: 24 U/L (ref 0–44)
AST: 49 U/L — ABNORMAL HIGH (ref 15–41)
Albumin: 4 g/dL (ref 3.5–5.0)
Alkaline Phosphatase: 106 U/L (ref 38–126)
Anion gap: 12 (ref 5–15)
BUN: 12 mg/dL (ref 8–23)
CO2: 17 mmol/L — ABNORMAL LOW (ref 22–32)
Calcium: 9.3 mg/dL (ref 8.9–10.3)
Chloride: 109 mmol/L (ref 98–111)
Creatinine, Ser: 0.95 mg/dL (ref 0.44–1.00)
GFR, Estimated: >60 mL/min (ref >60)
Glucose, Bld: 108 mg/dL — ABNORMAL HIGH (ref 70–99)
Potassium: 4.8 mmol/L (ref 3.5–5.1)
Sodium: 138 mmol/L (ref 135–145)
Total Bilirubin: 2.1 mg/dL — ABNORMAL HIGH (ref 0.3–1.2)
Total Protein: 6.6 g/dL (ref 6.5–8.1)

## 2022-02-17 LAB — CBC WITH DIFFERENTIAL/PLATELET
Abs Immature Granulocytes: 0.01 10*3/uL (ref 0.00–0.07)
Basophils Absolute: 0 10*3/uL (ref 0.0–0.1)
Basophils Relative: 0 %
Eosinophils Absolute: 0 10*3/uL (ref 0.0–0.5)
Eosinophils Relative: 0 %
HCT: 38 % (ref 36.0–46.0)
Hemoglobin: 12.4 g/dL (ref 12.0–15.0)
Immature Granulocytes: 0 %
Lymphocytes Relative: 8 %
Lymphs Abs: 0.6 10*3/uL — ABNORMAL LOW (ref 0.7–4.0)
MCH: 30 pg (ref 26.0–34.0)
MCHC: 32.6 g/dL (ref 30.0–36.0)
MCV: 92 fL (ref 80.0–100.0)
Monocytes Absolute: 0.5 10*3/uL (ref 0.1–1.0)
Monocytes Relative: 7 %
Neutro Abs: 6.1 10*3/uL (ref 1.7–7.7)
Neutrophils Relative %: 85 %
Platelets: 354 10*3/uL (ref 150–400)
RBC: 4.13 MIL/uL (ref 3.87–5.11)
RDW: 20.8 % — ABNORMAL HIGH (ref 11.5–15.5)
WBC: 7.2 10*3/uL (ref 4.0–10.5)
nRBC: 0.3 % — ABNORMAL HIGH (ref 0.0–0.2)

## 2022-02-17 LAB — LIPASE, BLOOD: Lipase: 24 U/L (ref 11–51)

## 2022-02-17 MED ORDER — IOHEXOL 300 MG/ML  SOLN
100.0000 mL | Freq: Once | INTRAMUSCULAR | Status: AC | PRN
  Administered 2022-02-17: 100 mL via INTRAVENOUS

## 2022-02-17 MED ORDER — OXYCODONE-ACETAMINOPHEN 5-325 MG PO TABS
2.0000 | ORAL_TABLET | Freq: Once | ORAL | Status: AC
  Administered 2022-02-17: 2 via ORAL
  Filled 2022-02-17: qty 2

## 2022-02-17 NOTE — ED Triage Notes (Signed)
Pt here via pov with reports of abdominal pain onset Monday. Now pain is radiating up into her chest. Pt visibly uncomfortable.

## 2022-02-17 NOTE — Telephone Encounter (Signed)
Pts grand daughter left vm for return call. I called back no answer/left vm.

## 2022-02-17 NOTE — ED Provider Triage Note (Signed)
Emergency Medicine Provider Triage Evaluation Note  Sumi Lye , a 61 y.o. female  was evaluated in triage.  Pt complains of abd pain. Recurrent abd pain ongoing x 3 days, appears uncomfortable.  Unsure of n/v/d or fever or dysuria  Review of Systems  Positive: As above Negative: As above  Physical Exam  BP 115/81 (BP Location: Right Arm)   Pulse 85   Temp 97.9 F (36.6 C) (Oral)   Resp 19   SpO2 100%  Gen:   Awake, appears uncomfortable Resp:  Normal effort  MSK:   Moves extremities without difficulty  Other:    Medical Decision Making  Medically screening exam initiated at 6:07 PM.  Appropriate orders placed.  Maryetta Shafer was informed that the remainder of the evaluation will be completed by another provider, this initial triage assessment does not replace that evaluation, and the importance of remaining in the ED until their evaluation is complete.     Domenic Moras, PA-C 02/17/22 1810

## 2022-02-18 ENCOUNTER — Emergency Department (HOSPITAL_COMMUNITY): Payer: Self-pay

## 2022-02-18 LAB — TROPONIN I (HIGH SENSITIVITY)
Troponin I (High Sensitivity): 7 ng/L (ref <18)
Troponin I (High Sensitivity): 6 ng/L (ref <18)

## 2022-02-18 MED ORDER — ONDANSETRON 4 MG PO TBDP
ORAL_TABLET | ORAL | 0 refills | Status: DC
Start: 2022-02-18 — End: 2022-03-09

## 2022-02-18 MED ORDER — FENTANYL CITRATE PF 50 MCG/ML IJ SOSY
100.0000 ug | PREFILLED_SYRINGE | Freq: Once | INTRAMUSCULAR | Status: AC
  Administered 2022-02-18: 100 ug via INTRAVENOUS
  Filled 2022-02-18: qty 2

## 2022-02-18 MED ORDER — FUROSEMIDE 10 MG/ML IJ SOLN
40.0000 mg | Freq: Once | INTRAMUSCULAR | Status: AC
Start: 2022-02-18 — End: 2022-02-18
  Administered 2022-02-18: 40 mg via INTRAVENOUS
  Filled 2022-02-18: qty 4

## 2022-02-18 MED ORDER — OXYCODONE-ACETAMINOPHEN 5-325 MG PO TABS: 1.0000 | ORAL_TABLET | Freq: Four times a day (QID) | ORAL | 0 refills | Status: DC | PRN

## 2022-02-18 MED ORDER — LACTATED RINGERS IV BOLUS
1000.0000 mL | Freq: Once | INTRAVENOUS | Status: AC
  Administered 2022-02-18: 1000 mL via INTRAVENOUS

## 2022-02-18 MED ORDER — HYDROMORPHONE HCL 1 MG/ML IJ SOLN
1.0000 mg | Freq: Once | INTRAMUSCULAR | Status: AC
  Administered 2022-02-18: 1 mg via INTRAVENOUS
  Filled 2022-02-18: qty 1

## 2022-02-18 MED ORDER — AMOXICILLIN-POT CLAVULANATE 875-125 MG PO TABS: 1.0000 | ORAL_TABLET | Freq: Two times a day (BID) | ORAL | 0 refills | Status: DC

## 2022-02-18 MED ORDER — LACTATED RINGERS IV BOLUS: 1000.0000 mL | Freq: Once | INTRAVENOUS | Status: DC

## 2022-02-18 MED ORDER — SODIUM CHLORIDE 0.9 % IV SOLN
3.0000 g | Freq: Once | INTRAVENOUS | Status: AC
  Administered 2022-02-18: 3 g via INTRAVENOUS
  Filled 2022-02-18: qty 8

## 2022-02-18 MED ORDER — ONDANSETRON HCL 4 MG/2ML IJ SOLN
4.0000 mg | Freq: Once | INTRAMUSCULAR | Status: AC
  Administered 2022-02-18: 4 mg via INTRAVENOUS
  Filled 2022-02-18: qty 2

## 2022-02-18 MED ORDER — PANTOPRAZOLE SODIUM 40 MG IV SOLR
40.0000 mg | Freq: Once | INTRAVENOUS | Status: AC
Start: 2022-02-18 — End: 2022-02-18
  Administered 2022-02-18: 40 mg via INTRAVENOUS
  Filled 2022-02-18: qty 10

## 2022-02-18 NOTE — ED Provider Notes (Signed)
[facility] EMERGENCY DEPARTMENT Provider Note   CSN: [id number] Arrival date & time: 02/17/22  1755     History  Chief Complaint  Patient presents with   Abdominal Pain    Megan Keith is a 61 y.o. female.  61 yo F here epigastric and RUQ pain states it has been goin gon for a few days, worse today. Some nausea, no emesis. No fevers. Had had this problem before and came here and was told nothing was wrong. On exam of the records it appears she was diagnosed with chf and thought to have congestive heart failure.    Abdominal Pain      Home Medications Prior to Admission medications   Medication Sig Start Date End Date Taking? Authorizing Provider  ondansetron (ZOFRAN-ODT) 4 MG disintegrating tablet '4mg'$  ODT q4 hours prn nausea/vomit 02/18/22  Yes Makella Buckingham, Corene Cornea, MD  acetaminophen (TYLENOL) 500 MG tablet Take 500 mg by mouth every 6 (six) hours as needed for moderate pain or headache.    [provider]  amiodarone (PACERONE) 200 MG tablet TAKE 1 Tablet BY MOUTH ONCE EVERY DAY 01/06/22   Bensimhon, Shaune Pascal, MD  amoxicillin-clavulanate (AUGMENTIN) 875-125 MG tablet Take 1 tablet by mouth every 12 (twelve) hours. 02/18/22   Sherrel Ploch, Corene Cornea, MD  anastrozole (ARIMIDEX) 1 MG tablet Take 1 tablet (1 mg total) by mouth every evening. 12/14/21   Mosher, Vida Roller A, PA-C  apixaban (ELIQUIS) 5 MG TABS tablet Take 1 tablet (5 mg total) by mouth 2 (two) times daily. 10/28/21   Bensimhon, Shaune Pascal, MD  furosemide (LASIX) 40 MG tablet Take 1.5 tablets (60 mg total) by mouth daily. 01/25/22   [place]ford, Maricela Bo, FNP  guaiFENesin (MUCINEX) 600 MG 12 hr tablet Take 2 tablets (1,200 mg total) by mouth 2 (two) times daily as needed for to loosen phlegm. 01/11/22   Danford, Suann Larry, MD  mexiletine (MEXITIL) 200 MG capsule Take 1 capsule (200 mg total) by mouth 2 (two) times daily. 02/15/22   Bensimhon, Shaune Pascal, MD  OMEPRAZOLE PO Take 20 mg by mouth daily as needed.     [provider]  oxyCODONE-acetaminophen (PERCOCET) 5-325 MG tablet Take 1 tablet by mouth every 6 (six) hours as needed for severe pain. 02/18/22   Lucella Pommier, Corene Cornea, MD  spironolactone (ALDACTONE) 25 MG tablet Take 1 tablet (25 mg total) by mouth daily. 02/11/22   Bensimhon, Shaune Pascal, MD      Allergies    Patient has no known allergies.    Review of Systems   Review of Systems  Gastrointestinal:  Positive for abdominal pain.    Physical Exam Updated Vital Signs BP 91/66   Pulse 70   Temp 98 F (36.7 C)   Resp 17   SpO2 95%  Physical Exam Vitals and nursing note reviewed.  Constitutional:      Appearance: She is well-developed.  HENT:     Head: Normocephalic and atraumatic.     Mouth/Throat:     Mouth: Mucous membranes are moist.     Pharynx: Oropharynx is clear.  Eyes:     Pupils: Pupils are equal, round, and reactive to light.  Cardiovascular:     Rate and Rhythm: Normal rate and regular rhythm.  Pulmonary:     Effort: No respiratory distress.     Breath sounds: No stridor.  Abdominal:     General: There is no distension.     Tenderness: There is generalized abdominal tenderness.  Musculoskeletal:  General: No swelling or tenderness. Normal range of motion.     Cervical back: Normal range of motion.  Skin:    General: Skin is warm and dry.  Neurological:     General: No focal deficit present.     Mental Status: She is alert.     ED Results / Procedures / Treatments   Labs (all labs ordered are listed, but only abnormal results are displayed) Labs Reviewed  CBC WITH DIFFERENTIAL/PLATELET - Abnormal; Notable for the following components:      Result Value   RDW 20.8 (*)    nRBC 0.3 (*)    Lymphs Abs 0.6 (*)    All other components within normal limits  COMPREHENSIVE METABOLIC PANEL - Abnormal; Notable for the following components:   CO2 17 (*)    Glucose, Bld 108 (*)    AST 49 (*)    Total Bilirubin 2.1 (*)    All other components within  normal limits  LIPASE, BLOOD  URINALYSIS, ROUTINE W REFLEX MICROSCOPIC  BRAIN NATRIURETIC PEPTIDE  TROPONIN I (HIGH SENSITIVITY)  TROPONIN I (HIGH SENSITIVITY)    EKG None  Radiology DG Chest 2 View  Result Date: 02/18/2022 CLINICAL DATA:  Abdominal pain. EXAM: CHEST - 2 VIEW COMPARISON:  01/09/2022 FINDINGS: Diffuse cardiac enlargement with mild central vascular congestion. Perihilar and basilar interstitial changes likely representing edema. No pleural effusions. No pneumothorax. Mild progression since prior study. IMPRESSION: Cardiac enlargement with mild vascular congestion and perihilar interstitial edema. Electronically Signed   By: Lucienne Capers M.D.   On: 02/18/2022 03:32   US Abdomen Limited RUQ (LIVER/GB)  Result Date: 02/18/2022 CLINICAL DATA:  Abdominal pain EXAM: ULTRASOUND ABDOMEN LIMITED RIGHT UPPER QUADRANT COMPARISON:  CT 02/17/2022 FINDINGS: Gallbladder: No stones or sludge. Mild gallbladder wall thickening at 4 mm diameter, nonspecific. No edema. Murphy's sign is negative. Common bile duct: Diameter: 4 mm, normal Liver: No focal lesion identified. Within normal limits in parenchymal echogenicity. Portal vein is patent on color Doppler imaging with normal direction of blood flow towards the liver. Other: None. IMPRESSION: Mild gallbladder wall thickening is nonspecific. No sonographic evidence of acute cholecystitis or cholelithiasis. Electronically Signed   By: Lucienne Capers M.D.   On: 02/18/2022 03:26   CT ABDOMEN PELVIS W CONTRAST  Result Date: 02/17/2022 CLINICAL DATA:  Abdomen pain EXAM: CT ABDOMEN AND PELVIS WITH CONTRAST TECHNIQUE: Multidetector CT imaging of the abdomen and pelvis was performed using the standard protocol following bolus administration of intravenous contrast. RADIATION DOSE REDUCTION: This exam was performed according to the departmental dose-optimization program which includes automated exposure control, adjustment of the mA and/or kV according  to patient size and/or use of iterative reconstruction technique. CONTRAST:  131m OMNIPAQUE IOHEXOL 300 MG/ML  SOLN COMPARISON:  CT 01/09/2022 FINDINGS: Lower chest: Lung bases demonstrate no acute consolidation or effusion. Cardiomegaly with biatrial enlargement. Reflux of contrast into the hepatic veins consistent with elevated right heart pressure. Hepatobiliary: Hypodense cyst near the gallbladder fossa. Hyperdense material in the gallbladder lumen with possible gallbladder wall thickening. Pancreas: Unremarkable. No pancreatic ductal dilatation or surrounding inflammatory changes. Spleen: Normal in size without focal abnormality. Adrenals/Urinary Tract: Adrenal glands are unremarkable. Kidneys are normal, without renal calculi, focal lesion, or hydronephrosis. Bladder is unremarkable. Stomach/Bowel: Stomach is within normal limits. Appendix appears normal. Negative appendix. Possible mild wall thickening/colitis type changes of the rectosigmoid colon. Vascular/Lymphatic: Nonaneurysmal aorta.  No suspicious lymph nodes Reproductive: Status post hysterectomy. No adnexal masses. Other: Negative for pelvic effusion or  free air. Musculoskeletal: No acute or significant osseous findings. IMPRESSION: 1. Hyperdense material in the gallbladder lumen, question sludge. Possible gallbladder wall thickening, this may be correlated with ultrasound if symptoms are referable to the right upper quadrant 2. Possible mild colitis type changes of the rectosigmoid colon, correlate for any history of diarrhea 3. Cardiomegaly with biatrial enlargement, reflux of contrast into the hepatic veins suggests elevated right heart pressure. Electronically Signed   By: Donavan Foil M.D.   On: 02/17/2022 21:36   US Venous Img Lower Bilateral (DVT)  Result Date: 02/17/2022 CLINICAL DATA:  Bilateral lower extremity pain, left greater right. Evaluate for DVT. EXAM: BILATERAL LOWER EXTREMITY VENOUS DOPPLER ULTRASOUND TECHNIQUE: Gray-scale  sonography with graded compression, as well as color Doppler and duplex ultrasound were performed to evaluate the lower extremity deep venous systems from the level of the common femoral vein and including the common femoral, femoral, profunda femoral, popliteal and calf veins including the posterior tibial, peroneal and gastrocnemius veins when visible. The superficial great saphenous vein was also interrogated. Spectral Doppler was utilized to evaluate flow at rest and with distal augmentation maneuvers in the common femoral, femoral and popliteal veins. COMPARISON:  None Available. FINDINGS: RIGHT LOWER EXTREMITY Common Femoral Vein: No evidence of thrombus. Normal compressibility, respiratory phasicity and response to augmentation. Saphenofemoral Junction: No evidence of thrombus. Normal compressibility and flow on color Doppler imaging. Profunda Femoral Vein: No evidence of thrombus. Normal compressibility and flow on color Doppler imaging. Femoral Vein: No evidence of thrombus. Normal compressibility, respiratory phasicity and response to augmentation. Popliteal Vein: No evidence of thrombus. Normal compressibility, respiratory phasicity and response to augmentation. Calf Veins: No evidence of thrombus. Normal compressibility and flow on color Doppler imaging. Superficial Great Saphenous Vein: No evidence of thrombus. Normal compressibility. Other Findings: Pulsatile venous flow is demonstrated throughout the entirety of the right lower extremity venous system. Note is made of an approximately 6.1 x 3.3 x 0.8 cm minimally complex fluid collection with the right popliteal fossa compatible with a Baker's cyst. LEFT LOWER EXTREMITY Common Femoral Vein: No evidence of thrombus. Normal compressibility, respiratory phasicity and response to augmentation. Saphenofemoral Junction: No evidence of thrombus. Normal compressibility and flow on color Doppler imaging. Profunda Femoral Vein: No evidence of thrombus. Normal  compressibility and flow on color Doppler imaging. Femoral Vein: No evidence of thrombus. Normal compressibility, respiratory phasicity and response to augmentation. Popliteal Vein: No evidence of thrombus. Normal compressibility, respiratory phasicity and response to augmentation. Calf Veins: No evidence of thrombus. Normal compressibility and flow on color Doppler imaging. Superficial Great Saphenous Vein: No evidence of thrombus. Normal compressibility. Other Findings: Pulsatile venous flow is demonstrated throughout the entirety of the left lower extremity venous system. IMPRESSION: 1. No evidence of DVT within either lower extremity. 2. Pulsatile venous flow is demonstrated throughout the bilateral lower extremity venous systems, nonspecific though could be seen in the setting of right-sided heart failure. Clinical correlation is advised. 3. Incidentally noted approximately 6.1 cm minimally complex right-sided Baker's cyst. Electronically Signed   By: Sandi Mariscal M.D.   On: 02/17/2022 07:31    Procedures Procedures    Medications Ordered in ED Medications  oxyCODONE-acetaminophen (PERCOCET/ROXICET) 5-325 MG per tablet 2 tablet (2 tablets Oral Given 02/17/22 1812)  iohexol (OMNIPAQUE) 300 MG/ML solution 100 mL (100 mLs Intravenous Contrast Given 02/17/22 2126)  fentaNYL (SUBLIMAZE) injection 100 mcg (100 mcg Intravenous Given 02/18/22 0256)  lactated ringers bolus 1,000 mL (0 mLs Intravenous Stopped 02/18/22 0622)  pantoprazole (PROTONIX)  injection 40 mg (40 mg Intravenous Given 02/18/22 0255)  furosemide (LASIX) injection 40 mg (40 mg Intravenous Given 02/18/22 0429)  ondansetron (ZOFRAN) injection 4 mg (4 mg Intravenous Given 02/18/22 0428)  Ampicillin-Sulbactam (UNASYN) 3 g in sodium chloride 0.9 % 100 mL IVPB (0 g Intravenous Stopped 02/18/22 0521)  HYDROmorphone (DILAUDID) injection 1 mg (1 mg Intravenous Given 02/18/22 0436)    ED Course/ Medical Decision Making/ A&P                            Medical Decision Making Amount and/or Complexity of Data Reviewed Labs: ordered. Radiology: ordered. ECG/medicine tests: ordered.  Risk Prescription drug management.   Colitis as possible cause on ct scan? Questionable biliary on CT but [place] reasssuring.  Possible congestive heart failure? Will initiate meds for same.   Colitis on ct. Improved with meds here. Will dc on same. Tolerating PO well. Pain gone.    Final Clinical Impression(s) / ED Diagnoses Final diagnoses:  Colitis    Rx / DC Orders ED Discharge Orders          Ordered    amoxicillin-clavulanate (AUGMENTIN) 875-125 MG tablet  Every 12 hours,   Status:  Discontinued        02/18/22 0613    oxyCODONE-acetaminophen (PERCOCET) 5-325 MG tablet  Every 6 hours PRN,   Status:  Discontinued        02/18/22 0613    amoxicillin-clavulanate (AUGMENTIN) 875-125 MG tablet  Every 12 hours        02/18/22 0615    oxyCODONE-acetaminophen (PERCOCET) 5-325 MG tablet  Every 6 hours PRN        02/18/22 0615    ondansetron (ZOFRAN-ODT) 4 MG disintegrating tablet        02/18/22 0615              Vasiliy Mccarry, Corene Cornea, MD 02/18/22 [phone]

## 2022-03-03 ENCOUNTER — Inpatient Hospital Stay (HOSPITAL_COMMUNITY)
Admission: EM | Admit: 2022-03-03 | Discharge: 2022-03-09 | DRG: 378 | Disposition: A | Discharge status: Home / Self Care | Payer: Self-pay | Attending: Internal Medicine | Admitting: Internal Medicine

## 2022-03-03 ENCOUNTER — Emergency Department (HOSPITAL_COMMUNITY): Payer: Self-pay

## 2022-03-03 ENCOUNTER — Encounter (HOSPITAL_COMMUNITY): Payer: Self-pay | Admitting: Emergency Medicine

## 2022-03-03 DIAGNOSIS — E0590 Thyrotoxicosis, unspecified without thyrotoxic crisis or storm: Secondary | ICD-10-CM | POA: Diagnosis present

## 2022-03-03 DIAGNOSIS — Z7901 Long term (current) use of anticoagulants: Secondary | ICD-10-CM

## 2022-03-03 DIAGNOSIS — Z853 Personal history of malignant neoplasm of breast: Secondary | ICD-10-CM

## 2022-03-03 DIAGNOSIS — I5022 Chronic systolic (congestive) heart failure: Secondary | ICD-10-CM | POA: Diagnosis present

## 2022-03-03 DIAGNOSIS — I493 Ventricular premature depolarization: Secondary | ICD-10-CM | POA: Diagnosis present

## 2022-03-03 DIAGNOSIS — K5791 Diverticulosis of intestine, part unspecified, without perforation or abscess with bleeding: Principal | ICD-10-CM | POA: Diagnosis present

## 2022-03-03 DIAGNOSIS — Z8249 Family history of ischemic heart disease and other diseases of the circulatory system: Secondary | ICD-10-CM

## 2022-03-03 DIAGNOSIS — Z8541 Personal history of malignant neoplasm of cervix uteri: Secondary | ICD-10-CM

## 2022-03-03 DIAGNOSIS — R748 Abnormal levels of other serum enzymes: Secondary | ICD-10-CM | POA: Diagnosis present

## 2022-03-03 DIAGNOSIS — Z8616 Personal history of COVID-19: Secondary | ICD-10-CM

## 2022-03-03 DIAGNOSIS — K5792 Diverticulitis of intestine, part unspecified, without perforation or abscess without bleeding: Principal | ICD-10-CM | POA: Diagnosis present

## 2022-03-03 DIAGNOSIS — Z90710 Acquired absence of both cervix and uterus: Secondary | ICD-10-CM

## 2022-03-03 DIAGNOSIS — I081 Rheumatic disorders of both mitral and tricuspid valves: Secondary | ICD-10-CM | POA: Diagnosis present

## 2022-03-03 DIAGNOSIS — I959 Hypotension, unspecified: Secondary | ICD-10-CM | POA: Diagnosis present

## 2022-03-03 DIAGNOSIS — I471 Supraventricular tachycardia: Secondary | ICD-10-CM | POA: Diagnosis present

## 2022-03-03 DIAGNOSIS — K921 Melena: Secondary | ICD-10-CM

## 2022-03-03 DIAGNOSIS — F4320 Adjustment disorder, unspecified: Secondary | ICD-10-CM | POA: Diagnosis present

## 2022-03-03 DIAGNOSIS — Z923 Personal history of irradiation: Secondary | ICD-10-CM

## 2022-03-03 DIAGNOSIS — Z79811 Long term (current) use of aromatase inhibitors: Secondary | ICD-10-CM

## 2022-03-03 DIAGNOSIS — I480 Paroxysmal atrial fibrillation: Secondary | ICD-10-CM | POA: Diagnosis present

## 2022-03-03 DIAGNOSIS — Z91141 Patient's other noncompliance with medication regimen due to financial hardship: Secondary | ICD-10-CM

## 2022-03-03 DIAGNOSIS — Z79899 Other long term (current) drug therapy: Secondary | ICD-10-CM

## 2022-03-03 DIAGNOSIS — I428 Other cardiomyopathies: Secondary | ICD-10-CM | POA: Diagnosis present

## 2022-03-03 DIAGNOSIS — T462X5A Adverse effect of other antidysrhythmic drugs, initial encounter: Secondary | ICD-10-CM | POA: Diagnosis present

## 2022-03-03 DIAGNOSIS — D509 Iron deficiency anemia, unspecified: Secondary | ICD-10-CM | POA: Diagnosis present

## 2022-03-03 DIAGNOSIS — Z86718 Personal history of other venous thrombosis and embolism: Secondary | ICD-10-CM

## 2022-03-03 DIAGNOSIS — M549 Dorsalgia, unspecified: Secondary | ICD-10-CM | POA: Diagnosis present

## 2022-03-03 DIAGNOSIS — K922 Gastrointestinal hemorrhage, unspecified: Secondary | ICD-10-CM

## 2022-03-03 LAB — CBC
HCT: 35.8 % — ABNORMAL LOW (ref 36.0–46.0)
Hemoglobin: 11.3 g/dL — ABNORMAL LOW (ref 12.0–15.0)
MCH: 28.5 pg (ref 26.0–34.0)
MCHC: 31.6 g/dL (ref 30.0–36.0)
MCV: 90.2 fL (ref 80.0–100.0)
Platelets: 327 10*3/uL (ref 150–400)
RBC: 3.97 MIL/uL (ref 3.87–5.11)
RDW: 19.5 % — ABNORMAL HIGH (ref 11.5–15.5)
WBC: 7.9 10*3/uL (ref 4.0–10.5)
nRBC: 0 % (ref 0.0–0.2)

## 2022-03-03 LAB — URINALYSIS, ROUTINE W REFLEX MICROSCOPIC
Bilirubin Urine: NEGATIVE
Glucose, UA: NEGATIVE mg/dL
Hgb urine dipstick: NEGATIVE
Ketones, ur: NEGATIVE mg/dL
Leukocytes,Ua: NEGATIVE
Nitrite: NEGATIVE
Protein, ur: NEGATIVE mg/dL
Specific Gravity, Urine: 1.002 — ABNORMAL LOW (ref 1.005–1.030)
pH: 7 (ref 5.0–8.0)

## 2022-03-03 LAB — COMPREHENSIVE METABOLIC PANEL
ALT: 42 U/L (ref 0–44)
AST: 38 U/L (ref 15–41)
Albumin: 3.6 g/dL (ref 3.5–5.0)
Alkaline Phosphatase: 189 U/L — ABNORMAL HIGH (ref 38–126)
Anion gap: 12 (ref 5–15)
BUN: 10 mg/dL (ref 8–23)
CO2: 19 mmol/L — ABNORMAL LOW (ref 22–32)
Calcium: 9.2 mg/dL (ref 8.9–10.3)
Chloride: 106 mmol/L (ref 98–111)
Creatinine, Ser: 0.91 mg/dL (ref 0.44–1.00)
GFR, Estimated: >60 mL/min (ref >60)
Glucose, Bld: 103 mg/dL — ABNORMAL HIGH (ref 70–99)
Potassium: 4.6 mmol/L (ref 3.5–5.1)
Sodium: 137 mmol/L (ref 135–145)
Total Bilirubin: 1.2 mg/dL (ref 0.3–1.2)
Total Protein: 6.3 g/dL — ABNORMAL LOW (ref 6.5–8.1)

## 2022-03-03 LAB — LIPASE, BLOOD: Lipase: 31 U/L (ref 11–51)

## 2022-03-03 MED ORDER — IOHEXOL 300 MG/ML  SOLN
80.0000 mL | Freq: Once | INTRAMUSCULAR | Status: AC | PRN
  Administered 2022-03-03: 80 mL via INTRAVENOUS

## 2022-03-03 MED ORDER — ONDANSETRON 4 MG PO TBDP
4.0000 mg | ORAL_TABLET | Freq: Once | ORAL | Status: AC
  Administered 2022-03-06: 4 mg via ORAL
  Filled 2022-03-03: qty 1

## 2022-03-03 NOTE — ED Provider Triage Note (Signed)
Emergency Medicine Provider Triage Evaluation Note  Liddy Deam , a 61 y.o. female  was evaluated in triage.  Pt complains of abdominal pain and blood in stool.  A few days prior patient had minimal blood noted after having a bowel movement.  Patient states that today she started having more amounts of bright red blood with bowel movements.  Denies any bleeding outside of bowel movements.  Patient also reports that yesterday she started having abdominal pain.  Pain is located throughout her entire abdomen.  Pain is getting progressively worse.  Patient endorses associated nausea.  Patient denies any pain with urination however states that she feels like it is difficult to urinate at times due to her stomach pain.  Review of Systems  Positive: Nausea, abdominal pain, blood in stool, fatigue Negative: Fever, chills, constipation, diarrhea, melena, chest pain, shortness of breath  Physical Exam  BP 97/75   Pulse 82   Temp 98.8 F (37.1 C)   Resp 16   SpO2 100%  Gen:   Awake, no distress   Resp:  Normal effort,  MSK:   Moves extremities without difficulty  Other:  Abdomen soft, nondistended, diffuse tenderness throughout entire abdomen.  Medical Decision Making  Medically screening exam initiated at 5:36 PM.  Appropriate orders placed.  Kriste Broman was informed that the remainder of the evaluation will be completed by another provider, this initial triage assessment does not replace that evaluation, and the importance of remaining in the ED until their evaluation is complete.     Loni Beckwith, [place] 03/03/22 1737

## 2022-03-03 NOTE — ED Triage Notes (Signed)
Patient here with complaint of abdominal pain, itching, and rectal bleeding that started yesterday. Patient takes eliquis. Patient is alert and oriented at this time.

## 2022-03-04 ENCOUNTER — Encounter (HOSPITAL_COMMUNITY): Payer: Self-pay | Admitting: Internal Medicine

## 2022-03-04 ENCOUNTER — Other Ambulatory Visit: Payer: Self-pay

## 2022-03-04 DIAGNOSIS — K921 Melena: Secondary | ICD-10-CM

## 2022-03-04 DIAGNOSIS — K5792 Diverticulitis of intestine, part unspecified, without perforation or abscess without bleeding: Secondary | ICD-10-CM | POA: Diagnosis present

## 2022-03-04 LAB — TYPE AND SCREEN
ABO/RH(D): O POS
Antibody Screen: NEGATIVE

## 2022-03-04 LAB — HEMOGLOBIN AND HEMATOCRIT, BLOOD
HCT: 35.6 % — ABNORMAL LOW (ref 36.0–46.0)
Hemoglobin: 10.7 g/dL — ABNORMAL LOW (ref 12.0–15.0)

## 2022-03-04 MED ORDER — ACETAMINOPHEN 650 MG RE SUPP: 650.0000 mg | Freq: Four times a day (QID) | RECTAL | Status: DC | PRN

## 2022-03-04 MED ORDER — SODIUM CHLORIDE 0.9 % IV SOLN: INTRAVENOUS | Status: AC

## 2022-03-04 MED ORDER — ANASTROZOLE 1 MG PO TABS
1.0000 mg | ORAL_TABLET | Freq: Every evening | ORAL | Status: DC
  Administered 2022-03-04 – 2022-03-08 (×5): 1 mg via ORAL
  Filled 2022-03-04 (×7): qty 1

## 2022-03-04 MED ORDER — AMIODARONE HCL 200 MG PO TABS
200.0000 mg | ORAL_TABLET | Freq: Every day | ORAL | Status: DC
  Administered 2022-03-04 – 2022-03-09 (×6): 200 mg via ORAL
  Filled 2022-03-04 (×6): qty 1

## 2022-03-04 MED ORDER — ACETAMINOPHEN 325 MG PO TABS
650.0000 mg | ORAL_TABLET | Freq: Four times a day (QID) | ORAL | Status: DC | PRN
  Administered 2022-03-05: 650 mg via ORAL

## 2022-03-04 MED ORDER — METRONIDAZOLE 500 MG/100ML IV SOLN
500.0000 mg | Freq: Once | INTRAVENOUS | Status: AC
  Administered 2022-03-04: 500 mg via INTRAVENOUS
  Filled 2022-03-04: qty 100

## 2022-03-04 MED ORDER — FENTANYL CITRATE PF 50 MCG/ML IJ SOSY
50.0000 ug | PREFILLED_SYRINGE | Freq: Once | INTRAMUSCULAR | Status: AC
  Administered 2022-03-04: 50 ug via INTRAVENOUS
  Filled 2022-03-04: qty 1

## 2022-03-04 MED ORDER — CEFTRIAXONE SODIUM 2 G IJ SOLR
2.0000 g | Freq: Once | INTRAMUSCULAR | Status: AC
  Administered 2022-03-04: 2 g via INTRAVENOUS
  Filled 2022-03-04: qty 20

## 2022-03-04 MED ORDER — FUROSEMIDE 40 MG PO TABS
60.0000 mg | ORAL_TABLET | Freq: Every day | ORAL | Status: DC
  Administered 2022-03-04 – 2022-03-09 (×6): 60 mg via ORAL
  Filled 2022-03-04 (×5): qty 1
  Filled 2022-03-04: qty 3

## 2022-03-04 MED ORDER — PANTOPRAZOLE SODIUM 40 MG PO TBEC
40.0000 mg | DELAYED_RELEASE_TABLET | Freq: Every day | ORAL | Status: DC
  Administered 2022-03-04 – 2022-03-09 (×6): 40 mg via ORAL
  Filled 2022-03-04 (×6): qty 1

## 2022-03-04 MED ORDER — AMOXICILLIN-POT CLAVULANATE 875-125 MG PO TABS
1.0000 | ORAL_TABLET | Freq: Two times a day (BID) | ORAL | Status: DC
Start: 2022-03-05 — End: 2022-03-06
  Administered 2022-03-05 (×2): 1 via ORAL
  Filled 2022-03-04 (×3): qty 1

## 2022-03-04 MED ORDER — MEXILETINE HCL 200 MG PO CAPS: 200.0000 mg | ORAL_CAPSULE | Freq: Two times a day (BID) | ORAL | Status: DC

## 2022-03-04 NOTE — H&P (Addendum)
Date: 03/04/2022               Patient Name:  Megan Keith MRN: [id number]  DOB: [birth year] Age / Sex: 60 y.o., female   PCP: Earna Coder, NP         Medical Service: Internal Medicine Teaching Service         Attending Physician: Dr. Charise Killian, MD    First Contact: Dr. Simeon Craft Pager: [phone]  Second Contact: Dr. 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  Patient is Spanish-speaking and history was obtained with the help of a [employer] interpreter, Quillian Quince [phone].  Patient reports diffuse abdominal pain with associated nausea for the past 2 weeks.  States that she came to the emergency department on 6/28 and was prescribed medications but did not realize this was antibiotics.  She did not complete the prescribed course of Augmentin.  She states that the abdominal pain continued to worsen and she started having dark red blood in her stools.  Yesterday evening she had a large amount of bright red blood in her stool as well as when she wiped.  She reports that this is never happened before. She has been compliant with all of her medications including Eliquis.  States she had a colonoscopy many years ago but does not recall when and states that it was normal at that time.  Denies any family history of colon cancer or other cancers.  She does endorse some lightheadedness  and dizziness intermittently since last November when she had her total hysterectomy.  She does endorse intermittent constipation, states sometimes it is difficult for her to go but denies any history of hemorrhoids.  She denies any chest pain or shortness of breath but feels that her chest and abdomen are inflamed.  She reports early satiety but denies any weight loss or vomiting.   Meds:  No current facility-administered medications on file prior to encounter.   Current Outpatient Medications on File Prior to Encounter  Medication Sig Dispense Refill   amiodarone (PACERONE) 200 MG tablet TAKE 1 Tablet BY MOUTH ONCE EVERY DAY (Patient taking differently: Take 200 mg by mouth daily.) 30 tablet 6   anastrozole (ARIMIDEX) 1 MG tablet Take 1 tablet (1 mg total) by mouth every evening. 90 tablet 3   apixaban (ELIQUIS) 5 MG TABS tablet Take 1 tablet (5 mg total) by mouth 2 (two) times daily. 60 tablet 3   furosemide (LASIX) 40 MG tablet Take 1.5 tablets (60 mg total) by mouth daily. 30 tablet 0   OMEPRAZOLE PO Take 20 mg by mouth daily.     spironolactone (ALDACTONE) 25 MG tablet Take 1 tablet (25 mg total) by mouth daily. 30 tablet 6   amoxicillin-clavulanate (AUGMENTIN) 875-125 MG tablet Take 1 tablet by mouth every 12 (twelve) hours. (Patient not taking: Reported on 03/04/2022) 14 tablet 0   guaiFENesin (MUCINEX) 600 MG 12 hr tablet Take 2  tablets (1,200 mg total) by mouth 2 (two) times daily as needed for to loosen phlegm. (Patient not taking: Reported on 03/04/2022) 20 tablet 0   mexiletine (MEXITIL) 200 MG capsule Take 1 capsule (200 mg total) by mouth 2 (two) times daily. 60 capsule 6   ondansetron (ZOFRAN-ODT) 4 MG disintegrating tablet 44m ODT q4 hours prn nausea/vomit (Patient not taking: Reported on 03/04/2022) 30 tablet 0   oxyCODONE-acetaminophen (PERCOCET) 5-325 MG tablet Take 1 tablet by mouth every 6 (six) hours as needed for severe pain. (Patient not taking: Reported on 03/04/2022) 20  tablet 0    Current Meds  Medication Sig   amiodarone (PACERONE) 200 MG tablet TAKE 1 Tablet BY MOUTH ONCE EVERY DAY (Patient taking differently: Take 200 mg by mouth daily.)   anastrozole (ARIMIDEX) 1 MG tablet Take 1 tablet (1 mg total) by mouth every evening.   apixaban (ELIQUIS) 5 MG TABS tablet Take 1 tablet (5 mg total) by mouth 2 (two) times daily.   furosemide (LASIX) 40 MG tablet Take 1.5 tablets (60 mg total) by mouth daily.   OMEPRAZOLE PO Take 20 mg by mouth daily.   spironolactone (ALDACTONE) 25 MG tablet Take 1 tablet (25 mg total) by mouth daily.     Allergies: Allergies as of 03/03/2022   (No Known Allergies)   Past Medical History:  Diagnosis Date   A-fib ([facility]    Abnormal TSH    Anemia    Arthritis    "knees, hands" (04/15/2017)   Breast cancer (HCC)    CHF (congestive heart failure) (HCC)    Chronic knee pain    Chronic systolic CHF (congestive heart failure) ([facility]    COVID-19    Does not have health insurance    Frequent PVCs    Hyperthyroidism 03/05/2019   Hyperthyroidism 03/05/2019   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 ([facility] [place] 04/15/2017   NICM (nonischemic cardiomyopathy) ([facility]    a. ? PVC cardiomyopathy. right and left heart catheterization on 04/15/17, no CAD. Fick output/index 5.28/3.34.   Noncompliance    a. episodic noncompliance.   Pneumonia 06/2016    Family History: Denies any family history of colon, breast or cervical cancer.  Mother is deceased, had hypertension.  Father is living and has diabetes  Social History: Patient has applied for her green card, currently is not a citizen.  Denies any tobacco alcohol or illicit drug use.  Review of Systems: A complete ROS was negative except as per HPI.   Physical Exam: Blood pressure 96/72, pulse 81, temperature (!) 97.4 F (36.3 C), resp. rate 19, SpO2 98 %. Physical Exam General: alert, appears stated age, in no acute distress HEENT: Normocephalic, atraumatic, EOM intact, conjunctiva  normal CV: Regular rate and rhythm, no murmurs rubs or gallops Pulm: Clear to auscultation bilaterally, normal work of breathing Abdomen: Soft, nondistended, diffuse tenderness to palpation bowel sounds present MSK: Trace lower extremity edema Skin: Warm and dry Neuro: Alert and oriented x3   EKG: personally reviewed my interpretation is normal sinus rhythm, borderline prolonged PR interval  CXR: N/A  CT abdomen pelvis with contrast: Right colonic diverticulosis. Stranding adjacent to the ascending colon and adjacent liver edge with small amount of fluid adjacent to the liver and in the cul-de-sac. Findings most compatible with active diverticulitis.  Assessment & Plan by Problem: Principal Problem:   Acute diverticulitis Active Problems:   Hematochezia   Melena  Acute uncomplicated diverticulitis Patient presented to the ER on 6/28 and was diagnosed with colitis at  the time discharged home on Augmentin.  Patient states that she did not realize she was prescribed antibiotics and did not take this prescription medication.  She returned to the ER due to worsening diffuse abdominal pain with associated nausea.  CT abdomen pelvis showed right colonic diverticulosis with adjacent stranding and free fluid consistent with acute diverticulitis.  Patient was started on ceftriaxone and metronidazole in the ED.  Can likely transition to oral antibiotics as long as patient is tolerating p.o. States that her pain is controlled at this time after receiving fentanyl in the ED. Although there is uncertainty for the need of antibiotics in the setting of uncomplicated diverticulitis will opt to continue oral antibiotics given the patient has had worsening abdominal pain for the past 2 weeks without treatment.  Of note patient did have pelvic radiation for her cervical cancer and reports that her cancer doctors at [facility] told her that her bleeding could be a side effect of this. -Switch to oral antibiotics,  Augmentin -IV fluids -Full liquid diet  Hematochezia Melena Normocytic anemia Patient reportedly had dark stools with dark red blood in her stool for the past 2 weeks.  Yesterday evening she had a large amount of bright red blood per rectum when she wiped as well as in her stool.  Hemoglobin 11.3> 10.7, decreased from 12.4 2 weeks ago.  Would expect microcytic anemia in the setting of GI bleed, MCV is within normal limits.  Blood pressures remain soft with SBP's in the 80s to 90s, though maps well above 65.  Per chart review patient's blood pressures do tend to remain in the low 100s per previous hospitalizations and office visit notes.  Patient states that she has a prior colonoscopy but no record of this.  Denies any history of colon cancer or family history of colon cancer.  She is hemodynamically stable at this time.  Discussed the case with GI who recommended close observation at this time.  Given acute diverticulitis would not perform a colonoscopy however if symptoms persist and/or worsen recommended an endoscopy in the setting of melena.  Of note patient does have an appointment with [facility] on 7/28. -Trend CBC -Iron studies -Monitor for s/sxs of bleeding -Daily PPI  Hypotension SBP 80s to 90s, typically ranges in the low 100s.  Patient denies any lightheadedness, dizziness or syncope at this time.  Holding spironolactone but resume furosemide. -CTM  Paroxysmal atrial fibrillation on Eliquis S/p previous DCCV in 7/20 with recurrent A-fib with COVID in 2020.was previously on amiodarone for PVCs but developed hyperthyroidism.  06/2019 patient had recurrent A-fib with RVR, with plans for ablation but canceled after EF of 5% and severe LAE was seen.  She was restarted on amiodarone 200 mg daily and Eliquis 5 mg twice daily.  Continue amiodarone.  We will hold Eliquis in the setting of possible GI bleed and monitor. -Continue amiodarone 200 mg daily -Hold Eliquis 5 mg twice daily in the  setting of possible GI bleed  History of frequent PVCs On amiodarone and mexiletine per medication list but patient states she is not taking mexiletine due to cost. -Continue amiodarone -Continue cardiac monitor  HFrEF Nonischemic cardiomyopathy Valvular disease Cardiac MRI 5/23 showed severe LV dilatation with EF of 17%.  Grade 3 diastolic dysfunction, RV moderately reduced, moderate MR and moderate to severe TR. Home medications include furosemide 60 mg daily and spironolactone 25 mg daily.  Unable to tolerate ARB/ARNI due to hypotension.  Digoxin was discontinued due to elevated levels.  Goal weight is approximately 126 pounds. -Continue furosemide 60 mg daily -Hold spironolactone in the setting of hypotension and possible GI bleed, low threshold to resume if pressures increase or patients volume status worsens  Hx of breast cancer s/p radiation therapy  -Continue anastrozole  Hx of cervical cancer Diagnosed 7/22 status post total hysterectomy.  Hyperthyroidism Previously treated with methimazole, completed treatment 5/21.  Adjustment disorder Presumed to be in the setting of health illnesses.  She is receiving CBT  Elevated Alk Phos 189. Trend CMP.  Diet: Full liquid diet, advance as tolerated VTE prophylaxis: SCDs, holding Eliquis in the setting of possible GI bleed Full code  Dispo: Admit patient to Observation with expected length of stay less than 2 midnights.  Signed: Aianna Fahs, Charlsie Quest, DO 03/04/2022, 3:20 PM  After 5pm on weekdays and 1pm on weekends: On Call pager: [phone]

## 2022-03-04 NOTE — Plan of Care (Signed)

## 2022-03-04 NOTE — ED Provider Notes (Signed)
[facility] EMERGENCY DEPARTMENT Provider Note   CSN: [id number] Arrival date & time: 03/03/22  1637     History  Chief Complaint  Patient presents with   Abdominal Pain    Megan Keith is a 61 y.o. female.  The history is provided by the patient. A language interpreter was used.  Abdominal Pain  Megan Keith is a 61 y.o. female who presents to the Emergency Department complaining of abdominal pain.  She presents to the emergency department for evaluation of 15 days of abdominal pain that she rates at 7 out of 10.  She states that she has poor appetite.  She was seen in the emergency department for similar pain and was treated with Augmentin for colitis.  She states that overall her pain is worsening.  She has been experiencing 1 week of bright red blood when she wipes and did have 2 grossly bloody bowel movement with clots today.  She has associated nausea, no vomiting.  No fever no chest pain no shortness of breath.  She has a history of CHF, paroxysmal A-fib on Eliquis, cervical cancer Does not have a gastroenterologist.  She is scheduled to follow-up with a gastroenterologist on June 21.  She does not know who this person is.  Home Medications Prior to Admission medications   Medication Sig Start Date End Date Taking? Authorizing Provider  acetaminophen (TYLENOL) 500 MG tablet Take 500 mg by mouth every 6 (six) hours as needed for moderate pain or headache.    [provider]  amiodarone (PACERONE) 200 MG tablet TAKE 1 Tablet BY MOUTH ONCE EVERY DAY 01/06/22   Bensimhon, Shaune Pascal, MD  amoxicillin-clavulanate (AUGMENTIN) 875-125 MG tablet Take 1 tablet by mouth every 12 (twelve) hours. 02/18/22   Mesner, Corene Cornea, MD  anastrozole (ARIMIDEX) 1 MG tablet Take 1 tablet (1 mg total) by mouth every evening. 12/14/21   Mosher, Vida Roller A, PA-C  apixaban (ELIQUIS) 5 MG TABS tablet Take 1 tablet (5 mg total) by mouth 2 (two) times daily. 10/28/21    Bensimhon, Shaune Pascal, MD  furosemide (LASIX) 40 MG tablet Take 1.5 tablets (60 mg total) by mouth daily. 01/25/22   [place], Maricela Bo, FNP  guaiFENesin (MUCINEX) 600 MG 12 hr tablet Take 2 tablets (1,200 mg total) by mouth 2 (two) times daily as needed for to loosen phlegm. 01/11/22   Danford, Suann Larry, MD  mexiletine (MEXITIL) 200 MG capsule Take 1 capsule (200 mg total) by mouth 2 (two) times daily. 02/15/22   Bensimhon, Shaune Pascal, MD  OMEPRAZOLE PO Take 20 mg by mouth daily as needed.    [provider]  ondansetron (ZOFRAN-ODT) 4 MG disintegrating tablet '4mg'$  ODT q4 hours prn nausea/vomit 02/18/22   Mesner, Corene Cornea, MD  oxyCODONE-acetaminophen (PERCOCET) 5-325 MG tablet Take 1 tablet by mouth every 6 (six) hours as needed for severe pain. 02/18/22   Mesner, Corene Cornea, MD  spironolactone (ALDACTONE) 25 MG tablet Take 1 tablet (25 mg total) by mouth daily. 02/11/22   Bensimhon, Shaune Pascal, MD      Allergies    Patient has no known allergies.    Review of Systems   Review of Systems  Gastrointestinal:  Positive for abdominal pain.  All other systems reviewed and are negative.   Physical Exam Updated Vital Signs BP 94/71   Pulse 82   Temp (!) 97.4 F (36.3 C)   Resp 18   SpO2 100%  Physical Exam Vitals and nursing note reviewed.  Constitutional:      Appearance: She is well-developed.  HENT:     Head: Normocephalic and atraumatic.  Cardiovascular:     Rate and Rhythm: Normal rate and regular rhythm.     Heart sounds: No murmur heard. Pulmonary:     Effort: Pulmonary effort is normal. No respiratory distress.     Breath sounds: Normal breath sounds.  Abdominal:     Palpations: Abdomen is soft.     Tenderness: There is no guarding or rebound.     Comments: Moderate upper abdominal tenderness without guarding or rebound  Genitourinary:    Comments: Nontender nonthrombosed external hemorrhoid with no gross blood on DRE. Musculoskeletal:        General: No tenderness.      Comments: Trace nonpitting edema to bilateral lower extremities  Skin:    General: Skin is warm and dry.  Neurological:     Mental Status: She is alert and oriented to person, place, and time.  Psychiatric:        Behavior: Behavior normal.     ED Results / Procedures / Treatments   Labs (all labs ordered are listed, but only abnormal results are displayed) Labs Reviewed  COMPREHENSIVE METABOLIC PANEL - Abnormal; Notable for the following components:      Result Value   CO2 19 (*)    Glucose, Bld 103 (*)    Total Protein 6.3 (*)    Alkaline Phosphatase 189 (*)    All other components within normal limits  CBC - Abnormal; Notable for the following components:   Hemoglobin 11.3 (*)    HCT 35.8 (*)    RDW 19.5 (*)    All other components within normal limits  URINALYSIS, ROUTINE W REFLEX MICROSCOPIC - Abnormal; Notable for the following components:   Color, Urine STRAW (*)    Specific Gravity, Urine 1.002 (*)    All other components within normal limits  HEMOGLOBIN AND HEMATOCRIT, BLOOD - Abnormal; Notable for the following components:   Hemoglobin 10.7 (*)    HCT 35.6 (*)    All other components within normal limits  URINE CULTURE  LIPASE, BLOOD  TYPE AND SCREEN    EKG None  Radiology CT ABDOMEN PELVIS W CONTRAST  Result Date: 03/03/2022 CLINICAL DATA:  Abdominal pain, acute, nonlocalized EXAM: CT ABDOMEN AND PELVIS WITH CONTRAST TECHNIQUE: Multidetector CT imaging of the abdomen and pelvis was performed using the standard protocol following bolus administration of intravenous contrast. RADIATION DOSE REDUCTION: This exam was performed according to the departmental dose-optimization program which includes automated exposure control, adjustment of the mA and/or kV according to patient size and/or use of iterative reconstruction technique. CONTRAST:  13m OMNIPAQUE IOHEXOL 300 MG/ML  SOLN COMPARISON:  02/17/2022 FINDINGS: Lower chest: Cardiomegaly.  No acute abnormality.  Hepatobiliary: No focal hepatic abnormality. Gallbladder unremarkable. Pancreas: No focal abnormality or ductal dilatation. Spleen: No focal abnormality.  Normal size. Adrenals/Urinary Tract: No adrenal abnormality. No focal renal abnormality. No stones or hydronephrosis. Urinary bladder is unremarkable. Stomach/Bowel: Right colonic diverticulosis. Stranding noted adjacent to the ascending colon and liver edge suggestive of acute diverticulitis. Stomach and small bowel decompressed, unremarkable. Vascular/Lymphatic: No evidence of aneurysm or adenopathy. Reproductive: Prior hysterectomy.  No adnexal masses. Other: Small amount of free fluid adjacent to the liver and in the cul-de-sac. No free air. Musculoskeletal: No acute bony abnormality. IMPRESSION: Right colonic diverticulosis. Stranding adjacent to the ascending colon and adjacent liver edge with small amount of fluid adjacent to the liver and  in the cul-de-sac. Findings most compatible with active diverticulitis. Electronically Signed   By: Rolm Baptise M.D.   On: 03/03/2022 23:15    Procedures Procedures    Medications Ordered in ED Medications  ondansetron (ZOFRAN-ODT) disintegrating tablet 4 mg (has no administration in time range)  cefTRIAXone (ROCEPHIN) 2 g in sodium chloride 0.9 % 100 mL IVPB (2 g Intravenous New Bag/Given 03/04/22 0718)    And  metroNIDAZOLE (FLAGYL) IVPB 500 mg (500 mg Intravenous New Bag/Given 03/04/22 0719)  iohexol (OMNIPAQUE) 300 MG/ML solution 80 mL (80 mLs Intravenous Contrast Given 03/03/22 2250)  fentaNYL (SUBLIMAZE) injection 50 mcg (50 mcg Intravenous Given 03/04/22 0175)    ED Course/ Medical Decision Making/ A&P                           Medical Decision Making Amount and/or Complexity of Data Reviewed Labs: ordered.  Risk Prescription drug management. Decision regarding hospitalization.   Patient with history of CHF, paroxysmal A-fib on Eliquis here for evaluation of persistent abdominal pain for 2  weeks as well as hematochezia.  She states blood with wiping for the last week with 2 grossly bloody bowel movements in the last 24 hours.  Last dose of Eliquis was Wednesday morning.  Initially on evaluation patient stated she had taken her antibiotics that she was recently prescribed but on repeat questioning patient states that she did not fill the antibiotic.  CT scan concerning for diverticulitis-we will start antibiotics.  Hemoglobin is downtrending during her ED stay.  Patient has borderline blood pressures but is well perfused-blood pressure appears to be at her baseline.  Given her anticoagulation, comorbidities and dropping hemoglobin recommend observation admission.  Medicine consulted for admission.        Final Clinical Impression(s) / ED Diagnoses Final diagnoses:  Acute diverticulitis  Lower GI bleed    Rx / DC Orders ED Discharge Orders     None         Quintella Reichert, MD 03/04/22 0725

## 2022-03-05 LAB — CBC WITH DIFFERENTIAL/PLATELET
Abs Immature Granulocytes: 0.01 10*3/uL (ref 0.00–0.07)
Basophils Absolute: 0 10*3/uL (ref 0.0–0.1)
Basophils Relative: 1 %
Eosinophils Absolute: 0.1 10*3/uL (ref 0.0–0.5)
Eosinophils Relative: 2 %
HCT: 32.3 % — ABNORMAL LOW (ref 36.0–46.0)
Hemoglobin: 10.4 g/dL — ABNORMAL LOW (ref 12.0–15.0)
Immature Granulocytes: 0 %
Lymphocytes Relative: 14 %
Lymphs Abs: 0.7 10*3/uL (ref 0.7–4.0)
MCH: 28.2 pg (ref 26.0–34.0)
MCHC: 32.2 g/dL (ref 30.0–36.0)
MCV: 87.5 fL (ref 80.0–100.0)
Monocytes Absolute: 0.5 10*3/uL (ref 0.1–1.0)
Monocytes Relative: 10 %
Neutro Abs: 3.5 10*3/uL (ref 1.7–7.7)
Neutrophils Relative %: 73 %
Platelets: 264 10*3/uL (ref 150–400)
RBC: 3.69 MIL/uL — ABNORMAL LOW (ref 3.87–5.11)
RDW: 19.2 % — ABNORMAL HIGH (ref 11.5–15.5)
WBC: 4.7 10*3/uL (ref 4.0–10.5)
nRBC: 0 % (ref 0.0–0.2)

## 2022-03-05 LAB — CBC
HCT: 39.4 % (ref 36.0–46.0)
Hemoglobin: 12.5 g/dL (ref 12.0–15.0)
MCH: 28.3 pg (ref 26.0–34.0)
MCHC: 31.7 g/dL (ref 30.0–36.0)
MCV: 89.3 fL (ref 80.0–100.0)
Platelets: 325 10*3/uL (ref 150–400)
RBC: 4.41 MIL/uL (ref 3.87–5.11)
RDW: 19.7 % — ABNORMAL HIGH (ref 11.5–15.5)
WBC: 6.7 10*3/uL (ref 4.0–10.5)
nRBC: 0 % (ref 0.0–0.2)

## 2022-03-05 LAB — BASIC METABOLIC PANEL
Anion gap: 11 (ref 5–15)
BUN: 10 mg/dL (ref 8–23)
CO2: 21 mmol/L — ABNORMAL LOW (ref 22–32)
Calcium: 8.4 mg/dL — ABNORMAL LOW (ref 8.9–10.3)
Chloride: 108 mmol/L (ref 98–111)
Creatinine, Ser: 0.96 mg/dL (ref 0.44–1.00)
GFR, Estimated: >60 mL/min (ref >60)
Glucose, Bld: 87 mg/dL (ref 70–99)
Potassium: 3.2 mmol/L — ABNORMAL LOW (ref 3.5–5.1)
Sodium: 140 mmol/L (ref 135–145)

## 2022-03-05 LAB — MAGNESIUM: Magnesium: 2 mg/dL (ref 1.7–2.4)

## 2022-03-05 LAB — URINE CULTURE

## 2022-03-05 LAB — IRON AND TIBC
Iron: 19 ug/dL — ABNORMAL LOW (ref 28–170)
Saturation Ratios: 5 % — ABNORMAL LOW (ref 10.4–31.8)
TIBC: 405 ug/dL (ref 250–450)
UIBC: 386 ug/dL

## 2022-03-05 LAB — FERRITIN: Ferritin: 11 ng/mL (ref 11–307)

## 2022-03-05 MED ORDER — POTASSIUM CHLORIDE CRYS ER 20 MEQ PO TBCR
40.0000 meq | EXTENDED_RELEASE_TABLET | Freq: Two times a day (BID) | ORAL | Status: AC
  Administered 2022-03-05 (×2): 40 meq via ORAL
  Filled 2022-03-05 (×2): qty 2

## 2022-03-05 MED ORDER — APIXABAN 5 MG PO TABS
5.0000 mg | ORAL_TABLET | Freq: Two times a day (BID) | ORAL | Status: DC
  Administered 2022-03-05 – 2022-03-09 (×9): 5 mg via ORAL
  Filled 2022-03-05 (×9): qty 1

## 2022-03-05 NOTE — Progress Notes (Addendum)
Hospital day#0 Subjective:   Overnight Events: No acute events overnight  Patient was tearful given that she has been hospitalized for multiple times and not understanding why she keeps coming back to the hospital.  We explained the misunderstanding about not knowing that she needed to take antibiotics.  Mentioned that she has been experiencing bouts of pain that have worsened over the past 2 weeks.  She also endorsed dark stools over the past 2 weeks but none since admission.  She also denied bright blood per rectum.  Has been able to eat more, drink more, feels a little anxious and sometimes is hard to breathe, but that this does not feel like when she has a lot of fluid in her body.  She does not feel swollen.  She had 2 bowel movements yesterday one of them formed and the other one a little looser with some mucus in it, but no diarrhea or blood in it.  Reexplained what diverticulitis is and what the process of using antibiotics is.  Denied any fevers, chills, headaches, palpitations, chest pain, shortness of breath, diarrhea.  Objective:  Vital signs in last 24 hours: Vitals:   03/04/22 1553 03/04/22 2036 03/05/22 0449 03/05/22 0824  BP:  94/78 93/71 99/74   Pulse:  80 63 74  Resp:  19 18 16   Temp:  98.4 F (36.9 C) 98 F (36.7 C) 98.4 F (36.9 C)  TempSrc:  Oral  Oral  SpO2:  100% 97% 100%  Weight: 59.7 kg     Height: 4' 9"  (3.818 m)      Supplemental O2: Room Air SpO2: 100 % Filed Weights   03/04/22 1553  Weight: 59.7 kg    Physical Exam:  Constitutional:ill-appearing woman sitting in bed, in mild acute distress HENT: normocephalic atraumatic, moist mucous membranes Cardiovascular: regular rate and rhythm, no m/r/g Pulmonary/Chest: normal work of breathing on room air, lungs clear to auscultation bilaterally. No wheezing Abdominal: normal BS, soft, mild distention, some tenderness to deep palpation on the right upper quadrant.   MSK: normal bulk and tone no Pitting  edema.  Neurological: alert & oriented x 3 Skin: warm and dry Psych: Tearful mood and affect    Intake/Output Summary (Last 24 hours) at 03/05/2022 1632 Last data filed at 03/05/2022 1227 Gross per 24 hour  Intake 240 ml  Output --  Net 240 ml   Net IO Since Admission: 894.12 mL [03/05/22 1632]  Pertinent Labs:    Latest Ref Rng & Units 03/05/2022    4:04 AM 03/04/2022    5:20 AM 03/03/2022    5:20 PM  CBC  WBC 4.0 - 10.5 K/uL 4.7   7.9   Hemoglobin 12.0 - 15.0 g/dL 10.4  10.7  11.3   Hematocrit 36.0 - 46.0 % 32.3  35.6  35.8   Platelets 150 - 400 K/uL 264   327        Latest Ref Rng & Units 03/05/2022    4:04 AM 03/03/2022    5:20 PM 02/17/2022    6:10 PM  CMP  Glucose 70 - 99 mg/dL 87  103  108   BUN 8 - 23 mg/dL 10  10  12    Creatinine 0.44 - 1.00 mg/dL 0.96  0.91  0.95   Sodium 135 - 145 mmol/L 140  137  138   Potassium 3.5 - 5.1 mmol/L 3.2  4.6  4.8   Chloride 98 - 111 mmol/L 108  106  109   CO2  22 - 32 mmol/L 21  19  17    Calcium 8.9 - 10.3 mg/dL 8.4  9.2  9.3   Total Protein 6.5 - 8.1 g/dL  6.3  6.6   Total Bilirubin 0.3 - 1.2 mg/dL  1.2  2.1   Alkaline Phos 38 - 126 U/L  189  106   AST 15 - 41 U/L  38  49   ALT 0 - 44 U/L  42  24     Imaging: No results found.  Assessment/Plan:   Principal Problem:   Acute diverticulitis Active Problems:   Hematochezia   Melena   Patient Summary: Cyrstal Leitz is a 61 y.o. 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  Acute uncomplicated diverticulitis Patient presented with abdominal pain, diffusely, with nausea and anorexia.  CT abdomen and pelvis on admission showed  right colonic diverticulosis with adjacent stranding and free fluid consistent with acute diverticulitis.  Patient was started on ceftriaxone and Flagyl on 7/13.  Today patient denies nausea, vomiting.  Reports improving appetite, and improvement in abdominal pain, though still is present. -Transitioned to Augmentin -Transition to regular diet  Hematochezia Melena Normocytic anemia Patient has been reporting melanotic stools with some bright blood for the past 2 weeks.  Hemoglobin on admission was 11.3 > 10.7> 10.4 today 7/14.  Blood pressure continues to be stable for this patient with SBP's in the 90s, MAP above 65.  She continues to be hemodynamically stable at this time.  GI was consulted, recommended close observation but given acute diverticulitis they would not consider scoping this patient.  However if melena persists or hemodynamically unstable, they will consider endoscopy.  Iron studies revealed ferritin of 11 and iron saturation less than 5%.  Considered IV iron supplementation, but will defer in the setting of acute infection. -Repeat CBC tonight after restarting Eliquis -Monitor for signs and symptoms of bleeding -Continue PPI -Consider starting on p.o. iron supplementation  Hypotension Patient's SBP baseline in the low 100s.  Patient denying any lightheadedness dizziness or syncope during our conversation today.  Is tolerating p.o. and drinking well.  SBP's have remained stable -Continue to monitor vital signs   Paroxysmal atrial fibrillation on Eliquis S/p previous DCCV in 7/20 with recurrent A-fib with COVID in 2020.was previously on amiodarone for PVCs but developed hyperthyroidism.  06/2019 patient had recurrent A-fib with RVR, with plans for ablation but canceled after EF of 5% and severe LAE was seen.  She was restarted on amiodarone 200 mg daily and Eliquis 5 mg twice daily.  Patient has continued on amiodarone during this hospitalization, restarted Eliquis today given that  hemoglobin has remained stable.  We will continue to monitor for signs or symptoms of bleeding -Continue amiodarone 200 mg daily - Restarted Eliquis 5 mg twice daily   History of frequent PVCs On amiodarone and mexiletine per medication list but patient states she is not taking mexiletine due to cost.  Contacted cardiology today to notify of mexiletine noncompliance.  Cardiology recommended reengagement if patient has signs or symptoms of high PVC burden again. -Continue amiodarone -Continue cardiac monitor   HFrEF Nonischemic cardiomyopathy Valvular disease Cardiac MRI 5/23 showed severe LV dilatation with EF of 17%.  Grade 3 diastolic dysfunction, RV moderately reduced, moderate MR and moderate to severe TR. Home medications include furosemide 60 mg daily and spironolactone 25 mg daily.  Unable to tolerate ARB/ARNI due to hypotension.  Digoxin was discontinued due to elevated levels.  Goal weight is approximately 126 pounds.  No evidence of volume overload today. -Continue furosemide 60 mg daily - Continue to hold spironolactone today, likely will restart tomorrow if vital signs continue stable   Hx of breast cancer s/p radiation therapy  -Continue anastrozole   Hx of cervical cancer Diagnosed 7/22 status post total hysterectomy.   Hyperthyroidism Previously treated with methimazole, completed treatment 5/21.   Adjustment disorder Tearful today, patient does not understand all of her medical problems, requires reorientation and careful explanation of each one of them.  Feels isolated whenever she comes to the hospital as she does not understand [place].  She is receiving CBT per admission note.   Elevated Alk Phos 189. Trend CMP.    Diet: Full liquid diet, advance as tolerated VTE prophylaxis: On Eliquis now  Code status: full code   Dispo: Anticipated discharge to Home in 1 days pending clinical improvement.   Romana Juniper, MD Internal Medicine Resident  PGY-1 Please contact the on call pager after 5 pm and on weekends at [phone].

## 2022-03-05 NOTE — TOC Initial Note (Signed)
Transition of Care [facility]) - Initial/Assessment Note    Patient Details  Name: Megan Keith MRN: [id number] Date of Birth: [birth year]  [facility] CM/SW Contact:    Tom-Johnson, Megan Keith Phone Number: 03/05/2022, 1:20 PM  Clinical Narrative:                  CM spoke with patient with the aide of her grand daughter, Megan Keith via phone. Admitted for Acute Diverticulitis.  From home with Megan Keith and her family. Undocumented, working on her [place]. Has Four supporting children. Has a cane at home. On Eliquis for PAF. On Oral Abx.  PCP is Megan Coder, NP. Does not have [employer], states she gets her prescription medications through [employer], a Research scientist (life sciences).  Family to transport at discharge. No TOC needs or recommendations noted. CM will continue to follow with needs.      Barriers to Discharge: Continued Medical Work up   Patient Goals and CMS Choice Patient states their goals for this hospitalization and ongoing recovery are:: To return home CMS Medicare.gov Compare Post Acute Care list provided to:: Patient    Expected Discharge Plan and Services     Discharge Planning Services: CM Consult   Living arrangements for the past 2 months: Single Family Home                                      Prior Living Arrangements/Services Living arrangements for the past 2 months: Single Family Home Lives with:: Adult Children ([place] daughter) Patient language and need for interpreter reviewed:: Yes Do you feel safe going back to the place where you live?: Yes      Need for Family Participation in Patient Care: Yes (Comment) Care giver support system in place?: Yes (comment)   Criminal Activity/Legal Involvement Pertinent to Current Situation/Hospitalization: No - Comment as needed  Activities of Daily Living Home Assistive Devices/Equipment: None ADL Screening (condition at time of admission) Patient's cognitive ability adequate  to safely complete daily activities?: Yes Is the patient deaf or have difficulty hearing?: No Does the patient have difficulty seeing, even when wearing glasses/contacts?: No Does the patient have difficulty concentrating, remembering, or making decisions?: No Patient able to express need for assistance with ADLs?: Yes Does the patient have difficulty dressing or bathing?: No Independently performs ADLs?: Yes (appropriate for developmental age) Does the patient have difficulty walking or climbing stairs?: No Weakness of Legs: None Weakness of Arms/Hands: None  Permission Sought/Granted Permission sought to share information with : Case Manager, Customer service manager, Family Supports Permission granted to share information with : Yes, Verbal Permission Granted              Emotional Assessment Appearance:: Appears stated age Attitude/Demeanor/Rapport: Engaged, Gracious Affect (typically observed): Accepting, Appropriate, Calm, Hopeful Orientation: : Oriented to Self, Oriented to Place, Oriented to  Time, Oriented to Situation Alcohol / Substance Use: Not Applicable Psych Involvement: No (comment)  Admission diagnosis:  Lower GI bleed [K92.2] Acute diverticulitis [K57.92] Patient Active Problem List   Diagnosis Date Noted   Acute diverticulitis 03/04/2022   Hematochezia 03/04/2022   Melena 03/04/2022   Pulmonary HTN ([place]) 01/10/2022   CAP (community acquired pneumonia) 01/09/2022   Severe sepsis ([place]) 01/09/2022   Depression 12/24/2021   AKI (acute kidney injury) ([place]) 12/23/2021   Hyperthyroidism 12/23/2021   Cervical cancer ([place]) 12/23/2021   Acute on  chronic systolic CHF (congestive heart failure) ([place]) 12/18/2021   Vaginal itching 08/03/2021   Osteoporosis 08/07/2020   Paroxysmal atrial fibrillation ([place]) 08/06/2019   COVID-19 06/06/2019   Chronic combined systolic and diastolic CHF (congestive heart failure) ([facility])    Atrial fibrillation with RVR ([place])  03/05/2019   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   GERD (gastroesophageal reflux disease) 02/05/2018   Breast cancer ([place]) 02/05/2018   Headache 02/05/2018   Nausea & vomiting 02/05/2018   Epigastric abdominal pain 02/05/2018   Sore throat 02/05/2018   Frequent PVCs    DCM (dilated cardiomyopathy) ([place])    Acute on chronic HFrEF (heart failure with reduced ejection fraction) ([place]) 04/15/2017   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 [place] [address] Phone: [phone] Fax: [place] [address], [place] [address] [place] [address] Phone: [phone] Fax: [phone]  [facility] [place], [place] [address] Phone: [phone] Fax: [phone]     Social Determinants of Health (SDOH) Interventions    Readmission Risk Interventions    07/10/2019    2:17 PM  Readmission Risk Prevention Plan  Transportation Screening Complete  PCP or Specialist Appt within 5-7 Days Complete  Home Care Screening Complete  Medication Review (Keith CM) Complete

## 2022-03-05 NOTE — Hospital Course (Addendum)
She is frustrated that she has been needing to come to the hospital often recently. Her stomach pain is feeling worse, feels more inflamed. Did have a regular BM yesterday afternoon with no blood or darkness noted. Was not sure what diverticulitis is, we discussed this with her. No chest pain or palpitations. She would like to eat something more substantial, feels hungry.

## 2022-03-06 LAB — BASIC METABOLIC PANEL
Anion gap: 6 (ref 5–15)
BUN: 14 mg/dL (ref 8–23)
CO2: 20 mmol/L — ABNORMAL LOW (ref 22–32)
Calcium: 8.8 mg/dL — ABNORMAL LOW (ref 8.9–10.3)
Chloride: 109 mmol/L (ref 98–111)
Creatinine, Ser: 0.92 mg/dL (ref 0.44–1.00)
GFR, Estimated: >60 mL/min (ref >60)
Glucose, Bld: 101 mg/dL — ABNORMAL HIGH (ref 70–99)
Potassium: 4.6 mmol/L (ref 3.5–5.1)
Sodium: 135 mmol/L (ref 135–145)

## 2022-03-06 LAB — CBC
HCT: 33.6 % — ABNORMAL LOW (ref 36.0–46.0)
Hemoglobin: 11 g/dL — ABNORMAL LOW (ref 12.0–15.0)
MCH: 28.3 pg (ref 26.0–34.0)
MCHC: 32.7 g/dL (ref 30.0–36.0)
MCV: 86.4 fL (ref 80.0–100.0)
Platelets: 274 10*3/uL (ref 150–400)
RBC: 3.89 MIL/uL (ref 3.87–5.11)
RDW: 19.6 % — ABNORMAL HIGH (ref 11.5–15.5)
WBC: 5.9 10*3/uL (ref 4.0–10.5)
nRBC: 0 % (ref 0.0–0.2)

## 2022-03-06 MED ORDER — METRONIDAZOLE 500 MG PO TABS
500.0000 mg | ORAL_TABLET | Freq: Three times a day (TID) | ORAL | Status: DC
  Administered 2022-03-06 – 2022-03-08 (×6): 500 mg via ORAL
  Filled 2022-03-06 (×6): qty 1

## 2022-03-06 MED ORDER — CIPROFLOXACIN HCL 500 MG PO TABS
500.0000 mg | ORAL_TABLET | Freq: Two times a day (BID) | ORAL | Status: DC
  Administered 2022-03-06 – 2022-03-09 (×7): 500 mg via ORAL
  Filled 2022-03-06 (×7): qty 1

## 2022-03-06 NOTE — Progress Notes (Signed)
Hospital day#1 Subjective:   Overnight Events: No acute overnight events.  She is doing better today, able to eat and drink fine. She is having some diarrhea after taking augmentin, states she has gone 3 times today with loose stool and also having some belching. She is okay with switching antibiotics. She is still having some epigastric discomfort, but slightly improving gradually.  She has a primary care doctor in the outpatient setting.   Objective:  Vital signs in last 24 hours: Vitals:   03/05/22 1724 03/05/22 2036 03/06/22 0544 03/06/22 0939  BP: 1'02/82 94/78 95/76 '$ 100/69  Pulse: 81 76 83 81  Resp: '16 18 20 17  '$ Temp: 98.3 F (36.8 C) 98.9 F (37.2 C) (!) 97.5 F (36.4 C) 98.2 F (36.8 C)  TempSrc: Oral Oral Oral   SpO2: 100% 100%  100%  Weight:      Height:       Supplemental O2: Room Air SpO2: 100 % Filed Weights   03/04/22 1553  Weight: 59.7 kg    Physical Exam:  General: elderly female, sitting up in bed, NAD. HENT: moist mucus membranes CV: normal rate and regular rhythm, no m/r/g. Pulm: CTABL, normal WOB on RA. Abdomen: soft, nondistended, normoactive BS. Mild TTP with deep palpation in epigastric region. MSK: trace edema. Neuro: AAOx3, no focal deficits. Skin: warm and dry.    Intake/Output Summary (Last 24 hours) at 03/06/2022 1059 Last data filed at 03/06/2022 0800 Gross per 24 hour  Intake 597 ml  Output 0 ml  Net 597 ml   Net IO Since Admission: 1,371.12 mL [03/06/22 1059]  Pertinent Labs:    Latest Ref Rng & Units 03/06/2022    2:23 AM 03/05/2022    6:36 PM 03/05/2022    4:04 AM  CBC  WBC 4.0 - 10.5 K/uL 5.9  6.7  4.7   Hemoglobin 12.0 - 15.0 g/dL 11.0  12.5  10.4   Hematocrit 36.0 - 46.0 % 33.6  39.4  32.3   Platelets 150 - 400 K/uL 274  325  264        Latest Ref Rng & Units 03/06/2022    2:23 AM 03/05/2022    4:04 AM 03/03/2022    5:20 PM  CMP  Glucose 70 - 99 mg/dL 101  87  103   BUN 8 - 23 mg/dL '14  10  10   '$ Creatinine  0.44 - 1.00 mg/dL 0.92  0.96  0.91   Sodium 135 - 145 mmol/L 135  140  137   Potassium 3.5 - 5.1 mmol/L 4.6  3.2  4.6   Chloride 98 - 111 mmol/L 109  108  106   CO2 22 - 32 mmol/L '20  21  19   '$ Calcium 8.9 - 10.3 mg/dL 8.8  8.4  9.2   Total Protein 6.5 - 8.1 g/dL   6.3   Total Bilirubin 0.3 - 1.2 mg/dL   1.2   Alkaline Phos 38 - 126 U/L   189   AST 15 - 41 U/L   38   ALT 0 - 44 U/L   42     Imaging: No results found.  Assessment/Plan:   Principal Problem:   Acute diverticulitis Active Problems:   Hematochezia   Melena   Patient Summary: Megan Keith is a 61 y.o. 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  Acute uncomplicated diverticulitis Patient presented with abdominal pain, diffusely, with nausea and anorexia.  CT abdomen and pelvis on admission showed right colonic diverticulosis with adjacent stranding and free fluid consistent with acute diverticulitis.  Received 2 days of antibiotic coverage thus far. No N/V today, but having some diarrhea and belching likely from augmentin as this started after initiation. Will transition to cipro and flagyl for coverage. Doing well with regular diet. -Transitioned to cipro '500mg'$  BID and flagyl '500mg'$  TID -continue regular diet -possible DC later today if feeling better  Hematochezia Melena Normocytic anemia Patient has been reporting melanotic stools with some bright blood for the past 2 weeks, likely from diverticular bleeding that transitioned to diverticulitis this admission. No longer having blood in stool.  Hgb stable after initiation of eliquis. Hemodynamically stable. Iron studies  revealed ferritin of 11 and iron saturation less than 5%.  Considered IV iron supplementation, but will defer in the setting of acute infection. -Monitor for signs and symptoms of bleeding -Continue PPI -Consider starting on p.o. iron supplementation  Hypotension Patient's SBP baseline in the low 100s, asymptomatic.  Is tolerating p.o. and drinking well.  SBP's have remained stable -Continue to monitor vital signs   Paroxysmal atrial fibrillation on Eliquis S/p previous DCCV in 7/20 with recurrent A-fib with COVID in 2020.was previously on amiodarone for PVCs but developed hyperthyroidism.  06/2019 patient had recurrent A-fib with RVR, with plans for ablation but canceled after EF of 5% and severe LAE was seen.  She was restarted on amiodarone 200 mg daily and Eliquis 5 mg twice daily.  Patient has continued on amiodarone during this hospitalization, restarted Eliquis yesterday and hgb is stable (mild drop but likely around baseline)  We will continue to monitor for signs or symptoms of bleeding -Continue amiodarone 200 mg daily - Continue Eliquis 5 mg twice daily   History of frequent PVCs On amiodarone and mexiletine per medication list but patient states she is not taking mexiletine due to cost.  Contacted cardiology to notify of mexiletine noncompliance.  Cardiology recommended reengagement if patient has signs or symptoms of high PVC burden again. -Continue amiodarone -Continue cardiac monitor   HFrEF Nonischemic cardiomyopathy Valvular disease Cardiac MRI 5/23 showed severe LV dilatation with EF of 17%.  Grade 3 diastolic dysfunction, RV moderately reduced, moderate MR and moderate to severe TR. Home medications include furosemide 60 mg daily and spironolactone 25 mg daily.  Unable to tolerate ARB/ARNI due to hypotension.  Digoxin was discontinued due to elevated levels.  Goal weight is approximately 126 pounds.  Trace edema in lower extremities today. -Continue furosemide 60 mg daily -  Continue to hold spironolactone today, consider restarting at discharge if vital signs stable   Hx of breast cancer s/p radiation therapy  -Continue anastrozole   Hx of cervical cancer Diagnosed 7/22 status post total hysterectomy.   Hyperthyroidism Previously treated with methimazole, completed treatment 5/21.    Diet: regular diet VTE prophylaxis: Eliquis  Code status: full code   Dispo: Anticipated discharge to Home in 0-1 days pending clinical improvement.   Virl Axe, MD Internal Medicine Resident PGY-2 Please contact the on call pager after 5 pm and on weekends at [phone].

## 2022-03-07 DIAGNOSIS — K5792 Diverticulitis of intestine, part unspecified, without perforation or abscess without bleeding: Secondary | ICD-10-CM

## 2022-03-07 DIAGNOSIS — I5020 Unspecified systolic (congestive) heart failure: Secondary | ICD-10-CM

## 2022-03-07 DIAGNOSIS — I493 Ventricular premature depolarization: Secondary | ICD-10-CM

## 2022-03-07 LAB — CBC
HCT: 33.3 % — ABNORMAL LOW (ref 36.0–46.0)
Hemoglobin: 11 g/dL — ABNORMAL LOW (ref 12.0–15.0)
MCH: 27.9 pg (ref 26.0–34.0)
MCHC: 33 g/dL (ref 30.0–36.0)
MCV: 84.5 fL (ref 80.0–100.0)
Platelets: 300 10*3/uL (ref 150–400)
RBC: 3.94 MIL/uL (ref 3.87–5.11)
RDW: 19.2 % — ABNORMAL HIGH (ref 11.5–15.5)
WBC: 5.6 10*3/uL (ref 4.0–10.5)
nRBC: 0 % (ref 0.0–0.2)

## 2022-03-07 LAB — BASIC METABOLIC PANEL
Anion gap: 12 (ref 5–15)
BUN: 10 mg/dL (ref 8–23)
CO2: 22 mmol/L (ref 22–32)
Calcium: 8.7 mg/dL — ABNORMAL LOW (ref 8.9–10.3)
Chloride: 105 mmol/L (ref 98–111)
Creatinine, Ser: 0.99 mg/dL (ref 0.44–1.00)
GFR, Estimated: >60 mL/min (ref >60)
Glucose, Bld: 88 mg/dL (ref 70–99)
Potassium: 3.5 mmol/L (ref 3.5–5.1)
Sodium: 139 mmol/L (ref 135–145)

## 2022-03-07 LAB — TSH: TSH: 0.624 u[IU]/mL (ref 0.350–4.500)

## 2022-03-07 LAB — MAGNESIUM: Magnesium: 2.2 mg/dL (ref 1.7–2.4)

## 2022-03-07 MED ORDER — HYDROXYZINE HCL 10 MG PO TABS
10.0000 mg | ORAL_TABLET | Freq: Three times a day (TID) | ORAL | Status: DC | PRN
Start: 2022-03-07 — End: 2022-03-09

## 2022-03-07 MED ORDER — POTASSIUM CHLORIDE 20 MEQ PO PACK
40.0000 meq | PACK | Freq: Two times a day (BID) | ORAL | Status: AC
  Administered 2022-03-07 (×2): 40 meq via ORAL
  Filled 2022-03-07 (×2): qty 2

## 2022-03-07 MED ORDER — MEXILETINE HCL 200 MG PO CAPS
200.0000 mg | ORAL_CAPSULE | Freq: Two times a day (BID) | ORAL | Status: DC
  Administered 2022-03-07 – 2022-03-09 (×4): 200 mg via ORAL
  Filled 2022-03-07 (×5): qty 1

## 2022-03-07 NOTE — Progress Notes (Signed)
BP is 78/52 manually, RN messaged MD on call to make aware.  Foster Simpson Shunna Mikaelian

## 2022-03-07 NOTE — Progress Notes (Signed)
Hospital day#2 Subjective:   Overnight Events: No acute events. Telemetry reads with high PVC burden and ventricular bigeminy  Patient tolerating PO intake today. No diarrhea episodes since switching medications (augmentin to cipro/flagyl). Denies lightheadedness, worsened weakness, palpitations. Endorses burning sensation on her epigastric area that has been there since yesterday.  Denies fevers, chills, night sweating. Reports abdominal pain is improving.    Objective:  Vital signs in last 24 hours: Vitals:   03/07/22 0445 03/07/22 0635 03/07/22 0636 03/07/22 0930  BP: (!) 78/52  96/79 93/73  Pulse:   73 83  Resp:   17 17  Temp:   98.6 F (37 C) 98.3 F (36.8 C)  TempSrc:   Oral   SpO2:   100% 100%  Weight:  57.8 kg    Height:       Supplemental O2: Room Air SpO2: 100 % Filed Weights   03/04/22 1553 03/07/22 0052 03/07/22 0635  Weight: 59.7 kg 58 kg 57.8 kg    Physical Exam:  General: elderly female, sitting up in bed, NAD. HENT: moist mucus membranes CV: normal rate and regular rhythm, no m/r/g. Pulm: normal WOB, clear to auscultation bilaterally. Abdomen: soft, nondistended, normoactive BS. Decreased tenderness to palpation on L upperquadrant. MSK: trace edema Neuro: AAOx3, no focal deficits. Skin: warm and dry.    Intake/Output Summary (Last 24 hours) at 03/07/2022 1159 Last data filed at 03/07/2022 0800 Gross per 24 hour  Intake 1020 ml  Output 1 ml  Net 1019 ml   Net IO Since Admission: 2,745.12 mL [03/07/22 1159]  Pertinent Labs:    Latest Ref Rng & Units 03/07/2022    1:10 AM 03/06/2022    2:23 AM 03/05/2022    6:36 PM  CBC  WBC 4.0 - 10.5 K/uL 5.6  5.9  6.7   Hemoglobin 12.0 - 15.0 g/dL 11.0  11.0  12.5   Hematocrit 36.0 - 46.0 % 33.3  33.6  39.4   Platelets 150 - 400 K/uL 300  274  325        Latest Ref Rng & Units 03/07/2022    1:10 AM 03/06/2022    2:23 AM 03/05/2022    4:04 AM  CMP  Glucose 70 - 99 mg/dL 88  101  87   BUN 8 - 23  mg/dL '10  14  10   '$ Creatinine 0.44 - 1.00 mg/dL 0.99  0.92  0.96   Sodium 135 - 145 mmol/L 139  135  140   Potassium 3.5 - 5.1 mmol/L 3.5  4.6  3.2   Chloride 98 - 111 mmol/L 105  109  108   CO2 22 - 32 mmol/L '22  20  21   '$ Calcium 8.9 - 10.3 mg/dL 8.7  8.8  8.4     Imaging: No results found.  Assessment/Plan:   Principal Problem:   Acute diverticulitis Active Problems:   Hematochezia   Melena   Patient Summary: Megan Keith is a 61 y.o. 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  CT abdomen and pelvis on admission showed right colonic diverticulosis with adjacent stranding and free fluid consistent  with acute diverticulitis.  Received 3 days of antibiotic coverage thus far. No diarrhea or belching episodes today, tolerating transition from Augmentin to Cipro/Flagyl. Tolerating PO intake without nausea or vomiting today. -Transitioned to cipro '500mg'$  BID and flagyl '500mg'$  TID -continue regular diet -possible DC later today if feeling better   History of frequent PVCs On amiodarone and mexiletine per medication list but patient states she is not taking mexiletine due to cost.  Contacted cardiology to notify of mexiletine noncompliance.  Cardiology recommended reengagement if patient has signs or symptoms of high PVC burden again. Patient with increased PVC burden over the last 24 hours. Cardiology re-engaged, appreciate recommendations. Patient previously not a PVC ablation candidate given MR and LAE. Patient with hx of  hyperthyroidism 2/2 to amiodarone use. Rechecking TSH, pending. Mg 2.2 this am. K 3.5, with a goal of 4.  -Continue amiodarone 200 mg daily -Continue cardiac monitor -TSH pending - trend Mg and K, supplement as necessary -Consult case management for medication assistance  Hypotension Patient's SBP baseline in the low 100s, asymptomatic.  Is tolerating p.o. and drinking well.  SBP's have remained stable -Continue to monitor vital signs   HFrEF Nonischemic cardiomyopathy Valvular disease Cardiac MRI 5/23 showed severe LV dilatation with EF of 17%.  Grade 3 diastolic dysfunction, RV moderately reduced, moderate MR and moderate to severe TR. Home medications include furosemide 60 mg daily and spironolactone 25 mg daily.  Unable to tolerate ARB/ARNI due to hypotension.  Digoxin was discontinued due to elevated levels.  Goal weight is approximately 126 pounds.  Trace edema in lower extremities today, otherwise euvolemic today. -Continue furosemide 60 mg daily - Holding spironolactone 2/2 hypotension - Consider continuing spironolactone at discharge at 12.'5mg'$  daily dose per cardiology  Paroxysmal atrial fibrillation on Eliquis S/p previous DCCV in 7/20 with recurrent A-fib with COVID in 2020. Was previously on amiodarone for PVCs but developed hyperthyroidism.  06/2019 patient had recurrent A-fib with RVR, with plans for ablation but canceled after EF of 5% and severe LAE was seen.  She was restarted on amiodarone 200 mg daily and Eliquis 5 mg twice daily.  Patient has continued on amiodarone during this hospitalization, restarted Eliquis 12/14 and hgb is stable (mild drop but likely around baseline) . We will continue to monitor for signs or symptoms of bleeding -Continue amiodarone 200 mg daily - Continue Eliquis 5 mg twice daily - Start on ron supplementation at discharge  Hematochezia Melena Iron deficiency anemia Patient has been reporting melanotic stools with some bright blood for the  past 2 weeks, likely from diverticular bleeding that transitioned to diverticulitis this admission. No longer having blood in stool.  Hgb stable after initiation of eliquis. Hemodynamically stable. Iron studies revealed Iron 19, ferritin of 11 and iron saturation less than 5%.  Considered IV iron supplementation, but will defer in the setting of acute infection. -Monitor for signs and symptoms of bleeding -Continue PPI -Consider starting on p.o. iron supplementation at discharge  Hyperthyroidism Previously treated with methimazole, completed treatment 5/21. - TSH, pending  Hx of breast cancer s/p radiation therapy  -Continue anastrozole   Hx of cervical cancer Diagnosed 7/22 status post total hysterectomy.     Diet: regular diet VTE prophylaxis: Eliquis  Code status: full code   Dispo: Anticipated discharge to Home in 1-2 days pending clinical improvement and medication changes.  Romana Juniper, MD Internal Medicine Resident PGY-1 Please contact the on call pager after 5 pm and on weekends at [phone].

## 2022-03-07 NOTE — Consult Note (Addendum)
Cardiology Consultation:   Patient ID: Megan Keith MRN: [id number]; DOB: Jul 01, 1961  Admit date: 03/03/2022 Date of Consult: 03/07/2022  PCP:  Earna Coder, NP   [facility] Providers Cardiologist:  Glori Bickers, MD        Patient Profile:   Megan Keith is a 61 y.o. female with a hx of HFrEF/NICM (EF 15-20% in 03/2017 with cath showing no evidence of CAD, EF 20% by echo in 09/2021), PAF/ectopic atrial tachycardia, moderate to severe MR, hyperthyroidism (secondary to Amiodarone), history of breast cancer and history of cervical cancer who is being seen 03/07/2022 for the evaluation of PVC's at the request of Dr. Saverio Danker.  History of Present Illness:   [name] was last examined in the [facility] on 01/25/2022 and reported overall doing well since her recent admission. She reported still having dyspnea and fatigue but denied any chest pain or palpitations. She was felt to be mildly volume overloaded and Lasix was increased to 60 mg daily with a goal weight of 126 lbs. She was continued on Spironolactone 25 mg daily.  She was not on Entresto due to soft BP/dizziness, not on a beta-blocker due to recent CHF exacerbation, no longer on Digoxin due to elevated dig level and was previously intolerant to [place]. Was not a candidate for advanced therapies at that time as she was not currently a [place] citizen and had applied for a [employer]. For her PVC's, she was continued on Amiodarone 200 mg daily and Mexiletine was reduced to 200 mg twice daily given her reported fatigue and nausea with the medication.  She presented to [facility] ED on 03/03/2022 for evaluation of worsening abdominal pain and rectal bleeding which started the day prior to admission. Initial labs showed WBC 7.9, Hgb 11.3, platelets 327, Na+ 137, K+ 4.6 and creatinine 0.91. AST 38 and ALT 42 with alk phos elevated to 189. CT Abdomen was obtained and consistent with acute  diverticulitis.  She was started on Ceftriaxone and Metronidazole while in the ED. She was continued on Amiodarone at the time of admission but Mexiletine was held as she reported not taking this secondary to cost.   In talking with the patient today, a spanish interpreter was used (ID# [phone]). She reports her main issue has been abdominal pain but this is starting to improve. No nausea or vomiting. Feels that her heart has been doing well and says her breathing has been stable. No specific orthopnea, PND or pitting edema. Does have paraesthesias in her legs at times. Experiences occasional palpitations but no recent progression of symptoms. Says she has been taking Amiodarone, Eliquis and Lasix but was unable to afford Mexiletine.    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 ? PVC cardiomyopathy. right and left heart catheterization on 04/15/17, no CAD. Fick output/index 5.28/3.34.   Noncompliance    a. episodic noncompliance.   Pneumonia 06/2016    Past  Surgical History:  Procedure Laterality Date   BREAST CYST EXCISION Left    CARDIOVERSION N/A 03/09/2019   Procedure: CARDIOVERSION;  Surgeon: Jolaine Artist, MD;  Location: [facility] ENDOSCOPY;  Service: Cardiovascular;  Laterality: N/A;   CARDIOVERSION N/A 07/05/2019   Procedure: CARDIOVERSION;  Surgeon: Thompson Grayer, MD;  Location: [place] CV LAB;  Service: Cardiovascular;  Laterality: N/A;   CORONARY/GRAFT ANGIOGRAPHY N/A 04/15/2017   Procedure: CORONARY/GRAFT ANGIOGRAPHY;  Surgeon: Nelva Bush, MD;  Location: [place] CV LAB;  Service: Cardiovascular;  Laterality: N/A;   RIGHT HEART CATH N/A 04/15/2017   Procedure: RIGHT HEART CATH;  Surgeon: Nelva Bush, MD;  Location: [place] CV LAB;  Service: Cardiovascular;  Laterality: N/A;   RIGHT HEART CATH N/A 03/12/2021   Procedure: RIGHT HEART CATH;  Surgeon: Jolaine Artist, MD;  Location: [place] CV LAB;  Service: Cardiovascular;  Laterality: N/A;   RIGHT HEART CATH N/A 12/21/2021   Procedure: RIGHT HEART CATH;  Surgeon: Jolaine Artist, MD;  Location: [place] CV LAB;  Service: Cardiovascular;  Laterality: N/A;   TEE WITHOUT CARDIOVERSION N/A 03/09/2019   Procedure: TRANSESOPHAGEAL ECHOCARDIOGRAM (TEE);  Surgeon: Jolaine Artist, MD;  Location: [facility] ENDOSCOPY;  Service: Cardiovascular;  Laterality: N/A;   TEE WITHOUT CARDIOVERSION N/A 07/05/2019   Procedure: TRANSESOPHAGEAL ECHOCARDIOGRAM (TEE);  Surgeon: Thompson Grayer, MD;  Location: [place] CV LAB;  Service: Cardiovascular;  Laterality: N/A;   TEE WITHOUT CARDIOVERSION N/A 04/21/2021   Procedure: TRANSESOPHAGEAL ECHOCARDIOGRAM (TEE);  Surgeon: Fay Records, MD;  Location: [facility] ENDOSCOPY;  Service: Cardiovascular;  Laterality: N/A;   TUBAL LIGATION       Home Medications:  Prior to Admission medications   Medication Sig Start Date End Date Taking? Authorizing Provider  amiodarone (PACERONE) 200 MG tablet TAKE 1 Tablet BY MOUTH ONCE EVERY DAY Patient taking differently: Take 200 mg by mouth daily. 01/06/22  Yes Bensimhon, Shaune Pascal, MD  anastrozole (ARIMIDEX) 1 MG tablet Take 1 tablet (1 mg total) by mouth every evening. 12/14/21  Yes Mosher, Vida Roller A, PA-C  apixaban (ELIQUIS) 5 MG TABS tablet Take 1 tablet (5 mg total) by mouth 2 (two) times daily. 10/28/21  Yes Bensimhon, Shaune Pascal, MD  furosemide (LASIX) 40 MG tablet Take 1.5 tablets (60 mg total) by mouth daily. 01/25/22  Yes Milford, Maricela Bo, FNP  OMEPRAZOLE PO Take 20 mg by mouth daily.   Yes  [provider]  spironolactone (ALDACTONE) 25 MG tablet Take 1 tablet (25 mg total) by mouth daily. 02/11/22  Yes Bensimhon, Shaune Pascal, MD  amoxicillin-clavulanate (AUGMENTIN) 875-125 MG tablet Take 1 tablet by mouth every 12 (twelve) hours. Patient not taking: Reported on 03/04/2022 02/18/22   Mesner, Corene Cornea, MD  guaiFENesin (MUCINEX) 600 MG 12 hr tablet Take 2 tablets (1,200 mg total) by mouth 2 (two) times daily as needed for to loosen phlegm. Patient not taking: Reported on 03/04/2022 01/11/22   Edwin Dada, MD  mexiletine (MEXITIL) 200 MG capsule Take 1 capsule (200 mg total) by mouth 2 (two) times daily. 02/15/22   Bensimhon, Shaune Pascal, MD  ondansetron (ZOFRAN-ODT) 4 MG disintegrating tablet 85m ODT q4 hours prn nausea/vomit Patient not taking: Reported on 03/04/2022 02/18/22   Mesner, JCorene Cornea MD  oxyCODONE-acetaminophen (PERCOCET) 5-325 MG tablet Take 1 tablet by mouth every 6 (six) hours as needed for severe pain. Patient not taking: Reported on 03/04/2022 02/18/22   MMerrily Pew MD    Inpatient Medications: Scheduled Meds:  amiodarone  200 mg  Oral Daily   anastrozole  1 mg Oral QPM   apixaban  5 mg Oral BID   ciprofloxacin  500 mg Oral BID   furosemide  60 mg Oral Daily   metroNIDAZOLE  500 mg Oral Q8H   pantoprazole  40 mg Oral Daily   potassium chloride  40 mEq Oral BID   Continuous Infusions:  PRN Meds: acetaminophen **OR** acetaminophen, hydrOXYzine  Allergies:   No Known Allergies  Social History:   Social History   Socioeconomic History   Marital status: Single    Spouse name: Not on file   Number of children: 4   Years of education: Not on file   Highest education level: Not on file  Occupational History   Not on file  Tobacco Use   Smoking status: Never   Smokeless tobacco: Never  Vaping Use   Vaping Use: Never used  Substance and Sexual Activity   Alcohol use: Not Currently    Comment: 04/15/2017 "might have a couple beers q couple months"    Drug use: Not Currently   Sexual activity: Not Currently  Other Topics Concern   Not on file  Social History Narrative   ** Merged History Encounter **       N/A   Social Determinants of Health   Financial Resource Strain: High Risk (10/30/2019)   Overall Financial Resource Strain (CARDIA)    Difficulty of Paying Living Expenses: Very hard  Food Insecurity: Unknown (06/06/2019)   Hunger Vital Sign    Worried About Running Out of Food in the Last Year: Patient refused    [place] in the Last Year: Patient refused  Transportation Needs: Unknown (06/06/2019)   [place] - Transportation    Lack of Transportation (Medical): Patient refused    Lack of Transportation (Non-Medical): Patient refused  Physical Activity: Unknown (07/07/2019)   Exercise Vital Sign    Days of Exercise per Week: Patient refused    Minutes of Exercise per Session: Patient refused  Stress: No Stress Concern Present (06/06/2019)   [employer] [place]    Feeling of Stress : Only a little  Social Connections: Unknown (07/07/2019)   Social Connection and Isolation Panel [NHANES]    Frequency of Communication with Friends and Family: Patient refused    Frequency of Social Gatherings with Friends and Family: Patient refused    Attends Religious Services: Patient refused    Active Member of Clubs or Organizations: Patient refused    Attends Archivist Meetings: Patient refused    Marital Status: Patient refused  Intimate Partner Violence: Unknown (07/07/2019)   Humiliation, Afraid, Rape, and Kick questionnaire    Fear of Current or Ex-Partner: Patient refused    Emotionally Abused: Patient refused    Physically Abused: Patient refused    Sexually Abused: Patient refused    Family History:    Family History  Problem Relation Age of Onset   Hypertension Mother    Arthritis Mother    Diabetes Father    Arthritis Sister      ROS:   Please see the history of present illness.   All other ROS reviewed and negative.     Physical Exam/Data:   Vitals:   03/07/22 0445 03/07/22 0635 03/07/22 0636 03/07/22 0930  BP: (!) 78/52  96/79 93/73  Pulse:   73 83  Resp:   17 17  Temp:   98.6 F (37 C) 98.3 F (36.8 C)  TempSrc:   Oral   SpO2:   100% 100%  Weight:  57.8 kg    Height:        Intake/Output Summary (Last 24 hours) at 03/07/2022 1201 Last data filed at 03/07/2022 0800 Gross per 24 hour  Intake 1020 ml  Output 1 ml  Net 1019 ml      03/07/2022    6:35 AM 03/07/2022   12:52 AM 03/04/2022    3:53 PM  Last 3 Weights  Weight (lbs) 127 lb 8 oz 127 lb 13.9 oz 131 lb 9.8 oz  Weight (kg) 57.834 kg 58 kg 59.7 kg     Body mass index is 27.59 kg/m.  General: Pleasant female appearing in no acute distress HEENT: normal Neck: no JVD Vascular: No carotid bruits; Distal pulses 2+ bilaterally Cardiac:  normal S1, S2; RRR; no murmur.  Lungs:  clear to auscultation bilaterally, no wheezing, rhonchi or rales  Abd: soft, nontender, no hepatomegaly  Ext: no pitting edema Musculoskeletal:  No deformities, BUE and BLE strength normal and equal Skin: warm and dry  Neuro:  CNs 2-12 intact, no focal abnormalities noted Psych:  Normal affect   EKG: No new tracings this admission.   Telemetry:  Telemetry was personally reviewed and demonstrates: NSR, HR in 60's to 70's. Episodes of ventricular bigeminy and quadrigeminy overnight. No sustained VT.   Relevant CV Studies:  Echocardiogram: 09/2021 IMPRESSIONS     1. Left ventricular ejection fraction, by estimation, is 20%. The left  ventricle has severely decreased function. The left ventricle demonstrates  global hypokinesis. The left ventricular internal cavity size was mildly  to moderately dilated. Left  ventricular diastolic parameters are consistent with Grade III diastolic  dysfunction (restrictive).   2. Right ventricular systolic function is moderately  reduced. The right  ventricular size is normal. There is moderately elevated pulmonary artery  systolic pressure.   3. Left atrial size was severely dilated.   4. Right atrial size was moderately dilated.   5. The mitral valve is normal in structure. Moderate mitral valve  regurgitation. No evidence of mitral stenosis.   6. Tricuspid valve regurgitation is moderate to severe.   7. The aortic valve is normal in structure. Aortic valve regurgitation is  not visualized. No aortic stenosis is present.   8. The inferior vena cava is normal in size with greater than 50%  respiratory variability, suggesting right atrial pressure of 3 mmHg.   cMRI: 12/2021 IMPRESSION: 1.  Severe LV dilatation with severe systolic dysfunction (EF 25%)   2.  Mild RV dilatation with moderate systolic dysfunction (EF 42%)   3. Midwall LGE in LV lateral wall, which is a scar pattern from nonischemic etiology such as prior myocarditis   4. RV insertion site LGE, which is a nonspecific scar pattern often seen in setting of elevated pulmonary pressures   5.  Moderate pericardial effusion   6.  Moderate mitral regurgitation (regurgitant fraction 32%)   7. Moderate to severe tricuspid regurgitation (was not quantified)  RHC: 12/2021 Findings:   RA = 4 RV = 59/7 PA = 62/30 (42) PCW = 30 (v waves to 35-40) Fick cardiac output/index = 4.7/3.1 PVR = 2.6 WU Ao sat = 99% PA sat = 74%, 75% SVC sat = 76%   PAPi = 8.0   Assessment: 1. Normal cardiac output with markedly elevated left-sided filling pressures.    Plan/Discussion:   Continue diuresis.    Laboratory Data:  High Sensitivity Troponin:   Recent  Labs  Lab 02/18/22 0244 02/18/22 0444  TROPONINIHS 7 6     Chemistry Recent Labs  Lab 03/05/22 0404 03/06/22 0223 03/07/22 0110  NA 140 135 139  K 3.2* 4.6 3.5  CL 108 109 105  CO2 21* 20* 22  GLUCOSE 87 101* 88  BUN _0 CREATININE 0.96 0.92 0.99  CALCIUM 8.4* 8.8* 8.7*  MG 2.0   --  2.2  GFRNONAA >60 >60 >60  ANIONGAP _1 Recent Labs  Lab 03/03/22 1720  PROT 6.3*  ALBUMIN 3.6  AST 38  ALT 42  ALKPHOS 189*  BILITOT 1.2   Lipids No results for input(s): "CHOL", "TRIG", "HDL", "LABVLDL", "LDLCALC", "CHOLHDL" in the last 168 hours.  Hematology Recent Labs  Lab 03/05/22 1836 03/06/22 0223 03/07/22 0110  WBC 6.7 5.9 5.6  RBC 4.41 3.89 3.94  HGB 12.5 11.0* 11.0*  HCT 39.4 33.6* 33.3*  MCV 89.3 86.4 84.5  MCH 28.3 28.3 27.9  MCHC 31.7 32.7 33.0  RDW 19.7* 19.6* 19.2*  PLT 325 274 300   Thyroid  Recent Labs  Lab 03/07/22 1047  TSH 0.624    BNPNo results for input(s): "BNP", "PROBNP" in the last 168 hours.  DDimer No results for input(s): "DDIMER" in the last 168 hours.   Radiology/Studies:  CT ABDOMEN PELVIS W CONTRAST  Result Date: 03/03/2022 CLINICAL DATA:  Abdominal pain, acute, nonlocalized EXAM: CT ABDOMEN AND PELVIS WITH CONTRAST TECHNIQUE: Multidetector CT imaging of the abdomen and pelvis was performed using the standard protocol following bolus administration of intravenous contrast. RADIATION DOSE REDUCTION: This exam was performed according to the departmental dose-optimization program which includes automated exposure control, adjustment of the mA and/or kV according to patient size and/or use of iterative reconstruction technique. CONTRAST:  57m OMNIPAQUE IOHEXOL 300 MG/ML  SOLN COMPARISON:  02/17/2022 FINDINGS: Lower chest: Cardiomegaly.  No acute abnormality. Hepatobiliary: No focal hepatic abnormality. Gallbladder unremarkable. Pancreas: No focal abnormality or ductal dilatation. Spleen: No focal abnormality.  Normal size. Adrenals/Urinary Tract: No adrenal abnormality. No focal renal abnormality. No stones or hydronephrosis. Urinary bladder is unremarkable. Stomach/Bowel: Right colonic diverticulosis. Stranding noted adjacent to the ascending colon and liver edge suggestive of acute diverticulitis. Stomach and small bowel  decompressed, unremarkable. Vascular/Lymphatic: No evidence of aneurysm or adenopathy. Reproductive: Prior hysterectomy.  No adnexal masses. Other: Small amount of free fluid adjacent to the liver and in the cul-de-sac. No free air. Musculoskeletal: No acute bony abnormality. IMPRESSION: Right colonic diverticulosis. Stranding adjacent to the ascending colon and adjacent liver edge with small amount of fluid adjacent to the liver and in the cul-de-sac. Findings most compatible with active diverticulitis. Electronically Signed   By: KRolm BaptiseM.D.   On: 03/03/2022 23:15     Assessment and Plan:   1. PVC's/Ventricular Bigeminy - She has a long-standing history of PVC's with prior monitor in 2021 showing a 16.2% burden but down to 3.3% burden in 01/2021. TSH is pending but Mg was at 2.2 this AM with K+ at 3.5. Additional K+ has already been ordered by the admitting team and would try to keep K+ ~ 4.0. - She was on Amiodarone 2056mdaily and Mexiletine 20022mID previously but says she has been unable to afford Mexiletine. By review of Good Rx, the lowest price is $20.87 at [facility]. Will consult Case Management to see if any other patient assistance options are available. She receives several of her medications through  [place] Mexiletine  is not available by review of their website. Continue Amiodarone 2364m daily. Previously not felt to be a PVC ablation candidate given her MR and LAE.   2. HFrEF/NICM - Her EF was at EF 15-20% in 03/2017 with cath showing no evidence of CAD and similar results of 20% by echo in 09/2021.  - She does not appear volume overloaded on examination today and her weight has been stable on her home scales. BP limits titration of medication therapy (at 93/73 on most recent check, therefore she has not been on ACE-I/ARB/ARNi/Spiro/BB. Previously on Digoxin but developed elevated dig levels and previously intolerant to SGLT2 inhibitor therapy. Remains on PO Lasix 6764mdaily.  Spironolactone currently held due to hypotension but can hopefully restart at a lower dose of 12.64m67maily at discharge.   3. PAF/Ectopic Atrial Tachycardia - She has maintained NSR this admission. Remains on Amiodarone 200m64mily. On Eliquis 64mg 51m for anticoagulation. She does have an iron deficiency anemia as discussed below and would recommend iron supplementation.   4. Mitral Regurgitation - This was moderate by echo in 09/2021. Continue to follow as an outpatient.   5. Anemia - Hgb at 11.3 on admission, similar at 11.0 today. Found to have iron deficiency anemia (Ferritin 11 and Iron 19). Defer management to the admitting team but would recommend iron supplementation at discharge.   6. Acute Diverticulitis - She remains on Cipro and Flagyl. Management per the admitting team.   For questions or updates, please contact [facility] consult [email or web address] for contact info under    Signed, BrittErma HeritageC  03/07/2022 12:01 PM  EP attending  Patient seen and examined.  Agree with the findings as noted above.  The patient is a very pleasant 61 ye19 old woman with a history of nonischemic cardiomyopathy and biventricular heart failure.  She has been bothered by PVCs.  The patient has been on both amiodarone and mexiletine but most recently her mexiletine was discontinued secondary to increased cost.  She denies findings above by BrittMauritaniamented in the best cost.  The patient was admitted to the hospital with diverticulitis.  She is gradually improving.  She was noted to have fairly dense ventricular ectopy and is referred for additional evaluation.  Her exam is notable for a pleasant middle-age woman in no distress.  Cardiovascular exam revealed irregular rhythm.  Lungs reveal scattered rales.  Extremities were warm.  Neuro was nonfocal.  Telemetry demonstrated sinus rhythm with PVCs. Assessment and plan 1.  PVCs -at this point continue amiodarone and restart  mexiletine 200 mg twice daily.  We will work to see if social work can help us [place] her obtain this medication. 2.  Chronic systolic heart failure.  She appears to be well compensated on guideline directed medical therapy.  No change in medications otherwise. 3.  Acute diverticulitis continue antibiotic therapy under the recommendations of the admitting team.  GreggCristopher [place]

## 2022-03-07 NOTE — Progress Notes (Addendum)
Paged due to hypotension to 78/52.  [name] is a 61 year old person with PMH of atrial fibrillation on amiodarone and apixaban, HFrEF (EF20% 2/23), who is admitted for acute uncomplicated diverticulitis.  Patient assessed at bedside. Interpretive services used throughout encounter. She states that she feels better than yesterday. She had some diarrhea, but that has resolved. She has been able to tolerate PO intake without nausea. Denies chest pain, palpitations, and shortness of breath.  O: BP 79/53 then 78/52, HR 51, RR 17 Constitutional: well-appearing, sitting up in bed, in no acute distress Cardiovascular: regular rate and rhythm, no m/r/g Pulmonary/Chest: normal work of breathing on room air, lungs clear to auscultation bilaterally MSK: no edema present to lower extremities bilaterally, extremities are warm and dry Neurological: alert & oriented x 3  Telemetry reviewed and showed high PVC burden, currently in ventricular bigeminy. AM BMP showed K of 3.2. Hgb stable. A: Patient has previously been on amiodarone and mexiletine due to frequent PVCs. She is asymptomatic and alert and oriented x3. She appears euvolemic. P: Replete K, check mag Patient will likely need ot reengage with cardiology in outpatient setting.

## 2022-03-07 NOTE — Progress Notes (Signed)
   03/07/22 0445  Assess: MEWS Score  BP (!) 78/52  Assess: MEWS Score  MEWS Temp 0  MEWS Systolic 2  MEWS Pulse 0  MEWS RR 0  MEWS LOC 0  MEWS Score 2  MEWS Score Color Yellow  Assess: if the MEWS score is Yellow or Red  Were vital signs taken at a resting state? Yes  Focused Assessment Change from prior assessment (see assessment flowsheet)  Does the patient meet 2 or more of the SIRS criteria? No  MEWS guidelines implemented *See Row Information* Yes  Treat  Pain Scale 0-10  Pain Score 0  Take Vital Signs  Increase Vital Sign Frequency  Yellow: Q 2hr X 2 then Q 4hr X 2, if remains yellow, continue Q 4hrs  Escalate  MEWS: Escalate Yellow: discuss with charge nurse/RN and consider discussing with provider and RRT  Notify: Charge Nurse/RN  Name of Charge Nurse/RN Notified Malachy Mood RN  Date Charge Nurse/RN Notified 03/07/22  Time Charge Nurse/RN Notified 0445  Notify: Provider  Provider Name/Title Snyder  Date Provider Notified 03/07/22  Time Provider Notified [phone]  Method of Notification Page  Notification Reason Other (Comment) (Bp low)  Provider response Other (Comment) (MD will review everything and informed RN to call if pt starts showing symptoms. Pt currently asymptomatic.)  Date of Provider Response 03/07/22  Time of Provider Response 0451  Document  Patient Outcome Other (Comment) (pt is asymptomatic)  Progress note created (see row info) Yes

## 2022-03-07 NOTE — Progress Notes (Signed)
Pt c/o itching all over and is wanting something for it. RN messaged MD on call to make aware. Awaiting on response.   Megan Keith Fayez Sturgell

## 2022-03-08 LAB — BASIC METABOLIC PANEL
Anion gap: 8 (ref 5–15)
BUN: 14 mg/dL (ref 8–23)
CO2: 23 mmol/L (ref 22–32)
Calcium: 8.9 mg/dL (ref 8.9–10.3)
Chloride: 105 mmol/L (ref 98–111)
Creatinine, Ser: 1.05 mg/dL — ABNORMAL HIGH (ref 0.44–1.00)
GFR, Estimated: >60 mL/min (ref >60)
Glucose, Bld: 108 mg/dL — ABNORMAL HIGH (ref 70–99)
Potassium: 4.2 mmol/L (ref 3.5–5.1)
Sodium: 136 mmol/L (ref 135–145)

## 2022-03-08 LAB — CBC
HCT: 35.4 % — ABNORMAL LOW (ref 36.0–46.0)
Hemoglobin: 11.2 g/dL — ABNORMAL LOW (ref 12.0–15.0)
MCH: 27.7 pg (ref 26.0–34.0)
MCHC: 31.6 g/dL (ref 30.0–36.0)
MCV: 87.4 fL (ref 80.0–100.0)
Platelets: 309 10*3/uL (ref 150–400)
RBC: 4.05 MIL/uL (ref 3.87–5.11)
RDW: 18.9 % — ABNORMAL HIGH (ref 11.5–15.5)
WBC: 5.4 10*3/uL (ref 4.0–10.5)
nRBC: 0 % (ref 0.0–0.2)

## 2022-03-08 LAB — MAGNESIUM: Magnesium: 2.2 mg/dL (ref 1.7–2.4)

## 2022-03-08 MED ORDER — ONDANSETRON HCL 4 MG/2ML IJ SOLN
4.0000 mg | Freq: Four times a day (QID) | INTRAMUSCULAR | Status: DC | PRN
Start: 2022-03-08 — End: 2022-03-09
  Administered 2022-03-08: 4 mg via INTRAVENOUS
  Filled 2022-03-08: qty 2

## 2022-03-08 MED ORDER — METRONIDAZOLE 500 MG PO TABS
500.0000 mg | ORAL_TABLET | Freq: Three times a day (TID) | ORAL | Status: DC
  Administered 2022-03-08 – 2022-03-09 (×3): 500 mg via ORAL
  Filled 2022-03-08 (×3): qty 1

## 2022-03-08 MED ORDER — ORAL CARE MOUTH RINSE: 15.0000 mL | OROMUCOSAL | Status: DC | PRN

## 2022-03-08 MED ORDER — METHOCARBAMOL 500 MG PO TABS
500.0000 mg | ORAL_TABLET | Freq: Two times a day (BID) | ORAL | Status: DC | PRN
  Administered 2022-03-08: 500 mg via ORAL
  Filled 2022-03-08: qty 1

## 2022-03-08 NOTE — Progress Notes (Signed)
Heart Failure Navigator Progress Note  Assessed for Heart & Vascular TOC clinic readiness.  Patient does not meet criteria due to Advanced Heart Failure patient.    Apoorva Bugay, BSN, RN Heart Failure Nurse Navigator Secure Chat Only   

## 2022-03-08 NOTE — TOC Progression Note (Signed)
Transition of Care [facility]) - Progression Note    Patient Details  Name: Megan Keith MRN: [id number] Date of Birth: 02-Aug-1961  Transition of Care [facility]) CM/SW Contact  Tom-Johnson, Renea Ee, RN Phone Number: 03/08/2022, 2:05 PM  Clinical Narrative:     CM consulted for medication assistance for Mexiletine. Patient was given a 30 day coupon last admission and unable to afford the next month. Patient is uninsured, unemployed and grand daughter she is staying with is also unemployed at this time. CM searched online to see if patient could enroll in any patient assistance program and each site states "The drug you are searching for is not available on any programs we know about". Patient gets her medication from [facility] and they do not cover this medication. CM reached out to [facility], Heart Failure CM and request a consult placed as they are not following patient at this time. MD notified to place consult.  CM will continue to follow with needs.     Barriers to Discharge: Continued Medical Work up  Expected Discharge Plan and Services     Discharge Planning Services: CM Consult   Living arrangements for the past 2 months: [place] Determinants of Health (SDOH) Interventions    Readmission Risk Interventions    07/10/2019    2:17 PM  Readmission Risk Prevention Plan  Transportation Screening Complete  PCP or Specialist Appt within 5-7 Days Complete  Home Care Screening Complete  Medication Review (RN CM) Complete

## 2022-03-08 NOTE — Progress Notes (Signed)
Hospital day#3 Subjective:   Overnight Events: 1x episode of nausea at 5am and increased back pain today. Cardiology evaluated the patient and started her on Mexiletine  Patient feeling better today from the abdominal pain stand point. Continues to pass formed-semi formed brown/dark brown bowel movements. Was able to eat and drink during the day yesterday. Has been feeling some heartburn, that has continued to improve with antiacid medication. However, patient reports that this morning she was given one of her antibiotic medications and she became nauseated. She's afraid to eat for fear of her presenting symptoms returning. She is also endorsing some back pain that is constant, and is worsened when she moves, takes deep breathes or coughs.   She is also worried that she will not get better. She is sad that she's been hospitalized "so many times" in the past six months and wants to get better. She also is afraid because she doesn't understand what is happening most of the time. Patient asked for the rounding team to talk with her granddaughter, who usually obtains and manages the patient's medications.  Addendum 1:44 PM: Called patient's granddaughter today.  Granddaughter confirmed patient has been able to get access to mexiletine with a coupon provided at discharge during the last hospitalization.  The problem was actually trying to get a second prescription of this, as the coupon did not work anymore and when these prescription was sent to [facility] a cost of over $100.  Patient lives with granddaughter, who is unemployed.  Patient has no to little income, and is able to afford medications through community pharmacy program.  Mexiletine is the only medication that is not covered, and it is difficult to say whether the patient or her granddaughter can commit to higher prices.  When asked if $20 was feasible, there was hesitation in the answer.  It seems that these is difficult given employment  status.     Objective:  Vital signs in last 24 hours: Vitals:   03/07/22 2058 03/08/22 0500 03/08/22 0544 03/08/22 0955  BP: 98/77  96/69 (!) 81/67  Pulse: 81  70 82  Resp: '18  18 17  '$ Temp: 97.7 F (36.5 C)  98.4 F (36.9 C) 98 F (36.7 C)  TempSrc: Oral  Oral   SpO2: 100%  100% 100%  Weight:  57.8 kg    Height:       Supplemental O2: Room Air SpO2: 100 % Filed Weights   03/07/22 0052 03/07/22 0635 03/08/22 0500  Weight: 58 kg 57.8 kg 57.8 kg    Physical Exam:  General: elderly female, sitting up in bed, NAD. HENT: moist mucus membranes CV: normal rate and regular rhythm, no m/r/g. Pulm: normal WOB, clear to auscultation bilaterally. Abdomen: soft, nondistended, normoactive BS. Decreased tenderness to palpation on L upperquadrant. MSK: trace edema. Tender to palpation on upper and lower back without exquisite CVA tenderness. Pain is reproducible on exam. Neuro: AAOx3, no focal deficits. Skin: warm and dry.    Intake/Output Summary (Last 24 hours) at 03/08/2022 1405 Last data filed at 03/08/2022 0600 Gross per 24 hour  Intake 540 ml  Output 0 ml  Net 540 ml   Net IO Since Admission: 3,585.12 mL [03/08/22 1405]  Pertinent Labs:    Latest Ref Rng & Units 03/08/2022    2:45 AM 03/07/2022    1:10 AM 03/06/2022    2:23 AM  CBC  WBC 4.0 - 10.5 K/uL 5.4  5.6  5.9   Hemoglobin 12.0 -  15.0 g/dL 11.2  11.0  11.0   Hematocrit 36.0 - 46.0 % 35.4  33.3  33.6   Platelets 150 - 400 K/uL 309  300  274        Latest Ref Rng & Units 03/08/2022    2:45 AM 03/07/2022    1:10 AM 03/06/2022    2:23 AM  CMP  Glucose 70 - 99 mg/dL 108  88  101   BUN 8 - 23 mg/dL '14  10  14   '$ Creatinine 0.44 - 1.00 mg/dL 1.05  0.99  0.92   Sodium 135 - 145 mmol/L 136  139  135   Potassium 3.5 - 5.1 mmol/L 4.2  3.5  4.6   Chloride 98 - 111 mmol/L 105  105  109   CO2 22 - 32 mmol/L '23  22  20   '$ Calcium 8.9 - 10.3 mg/dL 8.9  8.7  8.8     Imaging: No results found.  Assessment/Plan:    Principal Problem:   Acute diverticulitis Active Problems:   Hematochezia   Melena   Patient Summary: Megan Keith is a 61 y.o. 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  CT abdomen and pelvis on admission showed right colonic diverticulosis with adjacent stranding and free fluid consistent with acute diverticulitis.  Received 4 days of antibiotic coverage thus far. No diarrhea today. However, 1x episode of nausea with PO flagyl this am.  PRN zofran administered this am, with improvement. If patient's nausea continues to progress, will consider d/c antibiotic therapy as patient has uncomplicated diverticulitis, is improving, and has received 4 days of therapy. --Transitioned to cipro '500mg'$  BID and flagyl '500mg'$  TID --Will dose Flagyl with meals to decrease nausea on empty stomach --continue regular diet  History of frequent PVCs On amiodarone and mexiletine per medication list but patient states she is not taking mexiletine due to cost.  Contacted cardiology to notify of mexiletine noncompliance.  Cardiology recommended reengagement if patient has signs or symptoms of high PVC burden again. Patient with increased PVC burden over the last 24 hours. Cardiology re-engaged, appreciate recommendations. Patient previously not a PVC ablation candidate given MR and LAE. Patient with hx of hyperthyroidism 2/2 to amiodarone use. 7/16 TSH 0.6. This am: K 4.4, Mg 2.2. Patient discussed with case management given difficulty affording  mexiletine. Patient previously with coupon that only lasted one refill. Per Case Management, unable to provide more assistance. Appreciate Cardiology recommendations regarding alternative therapies, if any available. -Continue amiodarone 200 mg daily --Continue on Mexiletine 200 mg BID --Cardiology following, appreciate recommendations especially with Mexiletine alternatives -Continue cardiac monitor --Trend Mg and K, supplement as necessary --Consult case management for medication assistance  Hypotension Patient's SBP baseline in the low 100s, asymptomatic.  Is tolerating p.o. and drinking well.  No lightheadedness today. SBP's have remained stable. SBPs in upper 80s-90s today.  --Continue to monitor vital signs   HFrEF Nonischemic cardiomyopathy Valvular disease Cardiac MRI 5/23 showed severe LV dilatation with EF of 17%.  Grade 3 diastolic dysfunction, RV moderately reduced, moderate MR and moderate to severe TR. Home medications include furosemide 60  mg daily and spironolactone 25 mg daily.  Unable to tolerate ARB/ARNI due to hypotension.  Digoxin was discontinued due to elevated levels.  Goal weight is approximately 126 pounds.  Trace edema in lower extremities today, otherwise euvolemic today. -Continue furosemide 60 mg daily - Holding spironolactone 2/2 hypotension --Consider continuing spironolactone at discharge at 12.'5mg'$  daily dose per cardiology  Paroxysmal atrial fibrillation on Eliquis S/p previous DCCV in 7/20 with recurrent A-fib with COVID in 2020. Was previously on amiodarone for PVCs but developed hyperthyroidism.  06/2019 patient had recurrent A-fib with RVR, with plans for ablation but canceled after EF of 5% and severe LAE was seen.  She was restarted on amiodarone 200 mg daily and Eliquis 5 mg twice daily.  Patient has continued on amiodarone during this hospitalization, restarted Eliquis 12/14 and hgb is stable (mild drop but likely around baseline) . We will continue to  monitor for signs or symptoms of bleeding -Continue amiodarone 200 mg daily - Continue Eliquis 5 mg twice daily - Start on ron supplementation at discharge  Back pain Seems musculoskeletal in nature, reproducible on palpation during exam. Lungs cleat to auscultation, no overt CVA tenderness, no urinary symptoms, or laboratory data suggesting infection. Suspect muscle spasms. --Will trial Robaxin PRN and reassess in the am for improvement  Hematochezia Melena Iron deficiency anemia Patient has been reporting melanotic stools with some bright blood for the past 2 weeks, likely from diverticular bleeding that transitioned to diverticulitis this admission. No longer having blood in stool.  Hgb stable after initiation of eliquis. Hemodynamically stable. Iron studies revealed Iron 19, ferritin of 11 and iron saturation less than 5%.  Considered IV iron supplementation, but will defer in the setting of acute infection. -Monitor for signs and symptoms of bleeding -Continue PPI -Consider starting on p.o. iron supplementation at discharge  Hyperthyroidism Previously treated with methimazole, completed treatment 5/21. TSH 0.60 7/16.  Hx of breast cancer s/p radiation therapy  -Continue anastrozole   Hx of cervical cancer Diagnosed 7/22 status post total hysterectomy.    Diet: regular diet VTE prophylaxis: Eliquis  Code status: full code   Dispo: Anticipated discharge to Home in 1-2 days pending clinical improvement and medication changes.  Romana Juniper, MD Internal Medicine Resident PGY-1 Please contact the on call pager after 5 pm and on weekends at [phone].

## 2022-03-09 ENCOUNTER — Encounter: Payer: Self-pay | Admitting: Oncology

## 2022-03-09 ENCOUNTER — Other Ambulatory Visit (HOSPITAL_COMMUNITY): Payer: Self-pay

## 2022-03-09 LAB — CBC
HCT: 32.4 % — ABNORMAL LOW (ref 36.0–46.0)
Hemoglobin: 10.7 g/dL — ABNORMAL LOW (ref 12.0–15.0)
MCH: 28.2 pg (ref 26.0–34.0)
MCHC: 33 g/dL (ref 30.0–36.0)
MCV: 85.5 fL (ref 80.0–100.0)
Platelets: 288 10*3/uL (ref 150–400)
RBC: 3.79 MIL/uL — ABNORMAL LOW (ref 3.87–5.11)
RDW: 18.8 % — ABNORMAL HIGH (ref 11.5–15.5)
WBC: 5 10*3/uL (ref 4.0–10.5)
nRBC: 0 % (ref 0.0–0.2)

## 2022-03-09 LAB — BASIC METABOLIC PANEL
Anion gap: 8 (ref 5–15)
BUN: 9 mg/dL (ref 8–23)
CO2: 25 mmol/L (ref 22–32)
Calcium: 8.8 mg/dL — ABNORMAL LOW (ref 8.9–10.3)
Chloride: 103 mmol/L (ref 98–111)
Creatinine, Ser: 0.99 mg/dL (ref 0.44–1.00)
GFR, Estimated: >60 mL/min (ref >60)
Glucose, Bld: 93 mg/dL (ref 70–99)
Potassium: 3.3 mmol/L — ABNORMAL LOW (ref 3.5–5.1)
Sodium: 136 mmol/L (ref 135–145)

## 2022-03-09 LAB — MAGNESIUM: Magnesium: 2 mg/dL (ref 1.7–2.4)

## 2022-03-09 MED ORDER — METRONIDAZOLE 500 MG PO TABS
500.0000 mg | ORAL_TABLET | Freq: Three times a day (TID) | ORAL | 0 refills | Status: AC
  Filled 2022-03-09: qty 4, 2d supply, fill #0

## 2022-03-09 MED ORDER — MEXILETINE HCL 200 MG PO CAPS
200.0000 mg | ORAL_CAPSULE | Freq: Two times a day (BID) | ORAL | 0 refills | Status: AC
  Filled 2022-03-09: qty 120, 60d supply, fill #0

## 2022-03-09 MED ORDER — FUROSEMIDE 20 MG PO TABS
60.0000 mg | ORAL_TABLET | Freq: Every day | ORAL | 0 refills | Status: AC
  Filled 2022-03-09: qty 180, 60d supply, fill #0

## 2022-03-09 MED ORDER — CIPROFLOXACIN HCL 500 MG PO TABS
500.0000 mg | ORAL_TABLET | Freq: Two times a day (BID) | ORAL | 0 refills | Status: AC
  Filled 2022-03-09: qty 3, 2d supply, fill #0

## 2022-03-09 MED ORDER — SPIRONOLACTONE 25 MG PO TABS
12.5000 mg | ORAL_TABLET | Freq: Every day | ORAL | 0 refills | Status: DC
  Filled 2022-03-09: qty 30, 60d supply, fill #0

## 2022-03-09 MED ORDER — AMIODARONE HCL 200 MG PO TABS
200.0000 mg | ORAL_TABLET | Freq: Every day | ORAL | 0 refills | Status: AC
  Filled 2022-03-09: qty 60, 60d supply, fill #0

## 2022-03-09 NOTE — TOC CM/SW Note (Addendum)
MATCH completed for abx for home. Pt used in the past but reinstated de to abx needed at dc.   HF TOC CM spoke to pt and grand-dtr, Ms Brynda Rim via phone. States pt lives at home with her and she assist her with transportation and taking medications. Pt had medications in room. She receives Amiodarone, Spironolactone, and Furosemide from [facility]. [facility] does not provide Mexiletine. Dtr states the medication is over $100 at [facility] and they cannot afford. Pt goes to AHF clinic and pt will be able to received medication through HF fund at dc. Requested meds be sent to [facility]. Pt can use the [facility] for Mexiletine to receive free through HF fund as long as she continues with AHF clinic. Explained to pt's grand-dtr and will review again once she is here for dc. Grand-Dtr states she does continue to follow up with AHF clinic. Pt's PCP, Dr Owens Shark in [place]. Pt receives Eliquis through [employer] patient assistance program for free for a year (program renewable). Attending updated. [place], Heart Failure TOC CM [phone]

## 2022-03-09 NOTE — Progress Notes (Signed)
   03/09/22 0933  Assess: MEWS Score  Temp 98 F (36.7 C)  BP (!) 78/49  MAP (mmHg) (!) 59  Pulse Rate 84  Resp 17  SpO2 100 %  O2 Device Room Air  Assess: MEWS Score  MEWS Temp 0  MEWS Systolic 2  MEWS Pulse 0  MEWS RR 0  MEWS LOC 0  MEWS Score 2  MEWS Score Color Yellow  Assess: if the MEWS score is Yellow or Red  Were vital signs taken at a resting state? Yes  Focused Assessment No change from prior assessment  Does the patient meet 2 or more of the SIRS criteria? No  MEWS guidelines implemented *See Row Information* Yes  Treat  MEWS Interventions Other (Comment)  Pain Scale 0-10  Pain Score 0  Take Vital Signs  Increase Vital Sign Frequency  Yellow: Q 2hr X 2 then Q 4hr X 2, if remains yellow, continue Q 4hrs  Escalate  MEWS: Escalate Yellow: discuss with charge nurse/RN and consider discussing with provider and RRT  Notify: Charge Nurse/RN  Name of Charge Nurse/RN Notified Seth Bake  Date Charge Nurse/RN Notified 03/09/22  Time Charge Nurse/RN Notified 0935  Notify: Provider  Provider Name/Title [facility]  Date Provider Notified 03/09/22  Time Provider Notified 0940  Method of Notification Face-to-face;Rounds  Notification Reason Other (Comment) (bp low)  Provider response No new orders  Date of Provider Response 03/09/22  Time of Provider Response 0940  Document  Patient Outcome Other (Comment) (pt asymptomatic, will continue to monitor)  Progress note created (see row info) Yes  Assess: SIRS CRITERIA  SIRS Temperature  0  SIRS Pulse 0  SIRS Respirations  0  SIRS WBC 1  SIRS Score Sum  1

## 2022-03-09 NOTE — Plan of Care (Signed)

## 2022-03-09 NOTE — Progress Notes (Signed)
DISCHARGE NOTE HOME Megan Keith to be discharged Home per MD order. Discussed prescriptions and follow up appointments with the patient and granddaughter. Prescriptions given to patient; medication list explained in detail. Patient verbalized understanding.  Skin clean, dry and intact without evidence of skin break down, no evidence of skin tears noted. IV catheter discontinued intact. Site without signs and symptoms of complications. Dressing and pressure applied. Pt denies pain at the site currently. No complaints noted.  Patient free of lines, drains, and wounds.   An After Visit Summary (AVS) was printed and given to the patient. Patient escorted via wheelchair, and discharged home via private auto.  Vira Agar, RN

## 2022-03-09 NOTE — Discharge Instructions (Addendum)
Megan Keith,  Usted fue ingresada al hospital con dolor abdominal y le tratamos con atibiotiocos porque encontramos que usted tiene una inflamacion que se llama Diverticulitis. Usted fue tratada con antibioticos; su dolor ha mejorado, y usted ha podido comer y beber y creemos que usted esta estable y puede ser dada de alta del hospital.   Para su infeccion intestinal, debe continuar tomandose los siguientes antibioticos: Ciprofloxacin Tomese una pastilla esta noche 7/18 con la cena Tomese una pastilla manana 7/19 con el desayuno y la cena  Metronidazole Tomese una pastilla esta noche 7/18 con la cena Tomese [place] 7/19 con el desayuno, el almuerzo y la cena   [place], [place] consultado con el grupo de Archivist. Es muy importante que usted siga las siguientes instrucciones: - Continue siguiendo sus controles con Dr. Haroldine Laws en la clinica de falla cardiaca para que le sigan ayudando con el costo de los medicamentos. Tiene cita con el el 05/06/2022. - Le hemos recetado la amiodarona y Mexiletine. - Continue tomandose la furosemide (diuretico) 60 mg diarios - Cambio: Tomese la mitad de la pastilla de spironolactone (12.5 mg total) al dia -continue tomandose el [employer] veces al dia  Es importante que se haga seguimiento con su medico de cabecera. Por favor llame para hacerse seguimiento en una semana.  Recuerde que tiene cita con el gastroenterologo el 03/19/2022 a las 3 PM.

## 2022-03-09 NOTE — Discharge Summary (Signed)
Name: Megan Keith MRN: [id number] DOB: 03/02/1961 61 y.o. PCP: Earna Coder, NP  Date of Admission: 03/03/2022  5:14 PM Date of Discharge: 03/09/2022 5:10 PM Attending Physician: Dr. Philipp Ovens  Discharge Diagnosis: Principal Problem:   Acute diverticulitis Active Problems:   Hematochezia   Melena    Discharge Medications: Allergies as of 03/09/2022   No Known Allergies      Medication List     STOP taking these medications    amoxicillin-clavulanate 875-125 MG tablet Commonly known as: AUGMENTIN   ondansetron 4 MG disintegrating tablet Commonly known as: ZOFRAN-ODT   SM Mucus Relief 600 MG 12 hr tablet Generic drug: guaiFENesin       TAKE these medications    amiodarone 200 MG tablet Commonly known as: PACERONE Take 1 tablet (200 mg total) by mouth daily. TAKE 1 Tablet BY MOUTH ONCE EVERY DAY Strength: 200 mg What changed: See the new instructions.   anastrozole 1 MG tablet Commonly known as: ARIMIDEX Take 1 tablet (1 mg total) by mouth every evening.   apixaban 5 MG Tabs tablet Commonly known as: ELIQUIS Take 1 tablet (5 mg total) by mouth 2 (two) times daily.   ciprofloxacin 500 MG tablet Commonly known as: CIPRO Take 1 tablet by mouth 2 (two) times daily for 3 doses. Tomese una tableta en la noche del Frannie 7/18. Tomeses una tableta en la manana y en la noche 7/19   furosemide 20 MG tablet Commonly known as: LASIX Take 3 tablets (60 mg total) by mouth daily. Start taking on: March 10, 2022 What changed: medication strength   metroNIDAZOLE 500 MG tablet Commonly known as: FLAGYL Warden/ranger con comidas. Una tableta en la noche del 03/09/22, [place] miercol[place] 7/19 Una tableta con el desayuno, con el almuerzo y la ultima con la cena   mexiletine 200 MG capsule Commonly known as: MEXITIL Take 1 capsule (200 mg total) by mouth 2 (two) times daily.   OMEPRAZOLE PO Take 20 mg by mouth daily.   oxyCODONE-acetaminophen 5-325 MG  tablet Commonly known as: Percocet Take 1 tablet by mouth every 6 (six) hours as needed for severe pain.   spironolactone 25 MG tablet Commonly known as: ALDACTONE Take 0.5 tablets (12.5 mg total) by mouth daily. Tomese la mitad de la tableta de esta medicina diariamente What changed:  how much to take additional instructions        Disposition and follow-up:   Megan Keith was discharged from [facility] in Stable condition.  At the hospital follow up visit please address:  1.  Follow-up:  Follow up for resolution of acute uncomplicated diverticulitis. Assess BM and GI symptoms Follow up anemia and concern for recurrent lower GI bleeding. Patient to follow up with GI for colonoscopy.  Consider starting on IV or p.o. iron supplementation Follow with Dr. Haroldine Laws for CHF medical management given medication changes, notably adding Mexiletine and lowering spironolactone to 12.5 mg daily.  Assess blood pressure for medication management. Patient with chronic low blood pressures Follow up PVC burden and paroxysmal atrial fibrillation with 200 mg amiodarone and re-starting Mexiletine Follow TSH for hyperthyroidism  Follow electrolytes. Goal for K is 4, and Mg 2.2   2.  Labs / imaging needed at time of follow-up: cbc, bmp, TSH, blood pressure, colonoscopy  3.  Pending labs/ test needing follow-up: none  4.  Medication Changes  STOPPED  -Ciprofloxacin  -Flagyl   ADDED  -Mexiletine 200 mg   MODIFIED  -  Spironolactone 12.5 mg daily at discharge     Follow-up Appointments: 03/19/2022 3:00 PM Leotis [facility]  05/06/2022 11:40 AM Bensimhon, Shaune Pascal, MD [place]   05/24/2022 2:00 PM Marice Potter, MD [place] [facility]   08/05/2022 3:00 PM (Arrive by 2:45 PM) Philemon Kingdom, MD [facility] Course by problem  list: Megan Keith is a 61 y.o. 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  Patient received 6-day course of inpatient antibiotic coverage.  Patient discharged with p.o. ciprofloxacin and Flagyl to be completed by 7/19.    History of frequent PVCs Patient with a history of high PVC burden and a V. tach history previously on amiodarone and mexiletine.  Patient noncompliant with mexiletine secondary to out-of-pocket cost .  Cardiology was reengaged during this hospitalization given high PVC burden and ventricular bigeminy.  Patient was restarted on both amiodarone and mexiletine, with better control of PVC burden.  It was noted that as long as patient is able to follow-up with the heart failure clinic, she will be able to access medications.  Patient was discharged with a 60-day supply until she is able to follow with cardiologist in September 2023.  Recommend following magnesium and potassium with a goal of 2.2 and 4.0 respectively.  Continue to monitor TSH as patient has a history of hyperthyroidism secondary to amiodarone.   Hypotension Patient's SBP baseline in the low 100s.  Patient denied lightheadedness today.  Continue to monitor vital signs.  Given patient's history with hypotension, spironolactone was decreased to 12.5 mg daily.  Continue to monitor for signs of hypotension on follow-up.   HFrEF Nonischemic cardiomyopathy Valvular disease Cardiac MRI 5/23 showed severe LV  dilatation with EF of 17%.  Grade 3 diastolic dysfunction, RV moderately reduced, moderate MR and moderate to severe TR. patient is unable to tolerate ARB/ARNI due to history of hypotension.  Patient has trialed digoxin in the past, but was discontinued due to high levels.  Patient goal weight is 12 6 pounds.  No evidence of exacerbation on these admission.  Euvolemic today .  Continue on furosemide 60 mg and spironolactone decreased to 12.5 mg daily.    Paroxysmal atrial fibrillation on Eliquis S/p previous DCCV in 7/20 with recurrent A-fib with COVID in 2020. 06/2019 patient had recurrent A-fib with RVR, with plans for ablation but canceled after EF of 5% and severe LAE was seen.  She was restarted on amiodarone 200 mg daily and Eliquis 5 mg twice daily.  Now on mexiletine.  Monitor for signs of bleeding    Hematochezia Melena Iron deficiency anemia Patient reported melanotic stools with some bright blood for the past 2 weeks, likely from diverticular bleeding that transitioned to diverticulitis this admission.  Patient did not have bloody stools during this admission.  Initially held Eliquis, now back on therapy.  Hemoglobin has continued stable.  However, iron studies revealed Iron 19, ferritin of 11 and iron saturation less than 5%.  Did not supplement  iron given acute diverticulitis.  Recommend considering IV iron supplementation or p.o. on follow-up   Hyperthyroidism Previously treated with methimazole, completed treatment 5/21. TSH 0.60 7/16.   Hx of breast cancer s/p radiation therapy  -Continue anastrozole  Hx of cervical cancer Diagnosed 7/22 status post total hysterectomy.  Discharge Subjective:  Patient feeling much better today.  Feeling stronger, tolerating p.o. intake, no nausea or vomiting.  Was able to sleep better, without back pain.  Was able to pass a BM, formed, brown, Without blood or mucus.  Feels ready to go home.  Explained the importance to follow-up in heart failure  clinic.  Patient also understands to continue antibiotics until 7/19.  Discharge Exam:   Blood pressure (!) 84/66, pulse 75, temperature 98 F (36.7 C), temperature source Oral, resp. rate 18, height '4\' 9"'$  (1.448 m), weight 57.8 kg, SpO2 100 %.  Constitutional:well-appearing woman sitting in bed, in no acute distress HENT: normocephalic atraumatic, mucous membranes moist Cardiovascular: regular rate and rhythm, no m/r/g, no JVD Pulmonary/Chest: normal work of breathing on room air, lungs clear to auscultation bilaterally. No crackles  Abdominal: soft, non-tender, non-distended. No fluid wave. Neurological: alert & oriented x 3 MSK: no gross abnormalities. Trace pitting edema Skin: warm and dry Psych: Normal mood and affect  Pertinent Labs, Studies, and Procedures:     Latest Ref Rng & Units 03/09/2022    6:12 AM 03/08/2022    2:45 AM 03/07/2022    1:10 AM  CBC  WBC 4.0 - 10.5 K/uL 5.0  5.4  5.6   Hemoglobin 12.0 - 15.0 g/dL 10.7  11.2  11.0   Hematocrit 36.0 - 46.0 % 32.4  35.4  33.3   Platelets 150 - 400 K/uL 288  309  300        Latest Ref Rng & Units 03/09/2022    6:12 AM 03/08/2022    2:45 AM 03/07/2022    1:10 AM  CMP  Glucose 70 - 99 mg/dL 93  108  88   BUN 8 - 23 mg/dL '9  14  10   '$ Creatinine 0.44 - 1.00 mg/dL 0.99  1.05  0.99   Sodium 135 - 145 mmol/L 136  136  139   Potassium 3.5 - 5.1 mmol/L 3.3  4.2  3.5   Chloride 98 - 111 mmol/L 103  105  105   CO2 22 - 32 mmol/L '25  23  22   '$ Calcium 8.9 - 10.3 mg/dL 8.8  8.9  8.7     CT ABDOMEN PELVIS W CONTRAST  Result Date: 03/03/2022 CLINICAL DATA:  Abdominal pain, acute, nonlocalized EXAM: CT ABDOMEN AND PELVIS WITH CONTRAST TECHNIQUE: Multidetector CT imaging of the abdomen and pelvis was performed using the standard protocol following bolus administration of intravenous contrast. RADIATION DOSE REDUCTION: This exam was performed according to the departmental dose-optimization program which includes automated exposure  control, adjustment of the mA and/or kV according to patient size and/or use of iterative reconstruction technique. CONTRAST:  71m OMNIPAQUE IOHEXOL 300 MG/ML  SOLN COMPARISON:  02/17/2022 FINDINGS: Lower chest: Cardiomegaly.  No acute abnormality. Hepatobiliary: No focal hepatic abnormality. Gallbladder unremarkable. Pancreas: No focal abnormality or ductal dilatation. Spleen: No focal abnormality.  Normal size. Adrenals/Urinary Tract: No adrenal abnormality. No focal renal abnormality. No stones or hydronephrosis. Urinary bladder is unremarkable. Stomach/Bowel: Right colonic diverticulosis. Stranding noted adjacent to the ascending colon and liver edge suggestive of acute diverticulitis. Stomach and small bowel decompressed, unremarkable. Vascular/Lymphatic: No evidence of aneurysm or  adenopathy. Reproductive: Prior hysterectomy.  No adnexal masses. Other: Small amount of free fluid adjacent to the liver and in the cul-de-sac. No free air. Musculoskeletal: No acute bony abnormality. IMPRESSION: Right colonic diverticulosis. Stranding adjacent to the ascending colon and adjacent liver edge with small amount of fluid adjacent to the liver and in the cul-de-sac. Findings most compatible with active diverticulitis. Electronically Signed   By: Rolm Baptise M.D.   On: 03/03/2022 23:15     Discharge Instructions: Discharge Instructions     (HEART FAILURE PATIENTS) Call MD:  Anytime you have any of the following symptoms: 1) 3 pound weight gain in 24 hours or 5 pounds in 1 week 2) shortness of breath, with or without a dry hacking cough 3) swelling in the hands, feet or stomach 4) if you have to sleep on extra pillows at night in order to breathe.   Complete by: As directed    Diet - low sodium heart healthy   Complete by: As directed    Increase activity slowly   Complete by: As directed      Instructions given to the patient in [place].  Signed: Romana Juniper, MD [facility] -  PGY1 Pager: [phone] 03/09/2022, 5:10 PM

## 2022-03-19 ENCOUNTER — Ambulatory Visit (INDEPENDENT_AMBULATORY_CARE_PROVIDER_SITE_OTHER): Payer: Self-pay | Admitting: Physician Assistant

## 2022-03-19 ENCOUNTER — Encounter: Payer: Self-pay | Admitting: Physician Assistant

## 2022-03-19 VITALS — BP 90/70 | HR 104 | Ht <= 58 in | Wt 131.0 lb

## 2022-03-19 DIAGNOSIS — B370 Candidal stomatitis: Secondary | ICD-10-CM

## 2022-03-19 DIAGNOSIS — K5900 Constipation, unspecified: Secondary | ICD-10-CM

## 2022-03-19 DIAGNOSIS — I5042 Chronic combined systolic (congestive) and diastolic (congestive) heart failure: Secondary | ICD-10-CM

## 2022-03-19 DIAGNOSIS — K5792 Diverticulitis of intestine, part unspecified, without perforation or abscess without bleeding: Secondary | ICD-10-CM

## 2022-03-19 MED ORDER — NYSTATIN 100000 UNIT/ML MT SUSP: 5.0000 mL | Freq: Four times a day (QID) | OROMUCOSAL | 0 refills | Status: AC

## 2022-03-19 NOTE — Progress Notes (Signed)
Subjective:    Patient ID: Megan Keith, female    DOB: [birth year], 61 y.o.   MRN: [id number]  HPI Harjot is a 60 year old non-English-speaking Hispanic female, established with Dr. Loletha Carrow, when she was seen by myself in April 2023 after she had had an ER visit with complaints of chest pain and abdominal pain as well as shortness of breath.  On the day of that office visit she was acutely ill with congestive heart failure and was sent to the emergency room for admission.  We have not seen her back since.  Patient has known severe right heart failure, nonischemic cardiomyopathy with EF of 17% as of MRI May 2023, history of atrial fibrillation, pulmonary hypertension mitral regurgitation.  She is followed by the heart failure clinic.  She had a repeat hospital admission 03/04/2022 through 03/09/2022 when she presented to the emergency room with complaints of 2-week history of abdominal pain. She had CT of the abdomen and pelvis done which showed probable right colon diverticulitis with stranding adjacent to the a sending colon suggestive of diverticulitis there was a small amount of fluid adjacent to the liver and in the cul-de-sac. She was followed by the internal medicine service, treated with IV antibiotics and then discharged home to complete a course of Cipro and Flagyl.  She was not seen by GI during that admission.  Per  discharge summary she was to return to GI for colonoscopy.  She comes in today with multiple complaints today eating that in general she feels very poorly continued significant issues with fatigue and shortness of breath, says she is unable to sleep most nights.  She seems to have increasing shortness of breath at nighttime.  Also complaining of lack of appetite, and says that currently she does not have much taste.  She has had some mild constipation and continues to complain of some pain in her abdomen which she says is more in the right abdomen.  She is also  complaining of coughing, and back discomfort with coughing. Was able to complete the antibiotics, she is really unable to tell me if she feels much different since taking the antibiotics.  Recent labs 03/09/2022 with WBC 5.0/hemoglobin 10.7/hematocrit 32.4 Potassium 3.3/creatinine 0.89 Ferritin 11/serum iron 19/TIBC 405/iron sat 5  She has not been on iron supplementation.   Review of Systems Pertinent positive and negative review of systems were noted in the above HPI section.  All other review of systems was otherwise negative.   Outpatient Encounter Medications as of 03/19/2022  Medication Sig   amiodarone (PACERONE) 200 MG tablet Take 1 tablet (200 mg total) by mouth daily. TAKE 1 Tablet BY MOUTH ONCE EVERY DAY Strength: 200 mg   anastrozole (ARIMIDEX) 1 MG tablet Take 1 tablet (1 mg total) by mouth every evening.   apixaban (ELIQUIS) 5 MG TABS tablet Take 1 tablet (5 mg total) by mouth 2 (two) times daily.   furosemide (LASIX) 20 MG tablet Take 3 tablets (60 mg total) by mouth daily.   mexiletine (MEXITIL) 200 MG capsule Take 1 capsule (200 mg total) by mouth 2 (two) times daily.   nystatin (MYCOSTATIN) 100000 UNIT/ML suspension Take 5 mLs (500,000 Units total) by mouth 4 (four) times daily for 14 days. Swish in the mouth and swallow   spironolactone (ALDACTONE) 25 MG tablet Take 0.5 tablets (12.5 mg total) by mouth daily. Tomese la mitad de la tableta de esta medicina diariamente   oxyCODONE-acetaminophen (PERCOCET) 5-325 MG tablet Take 1 tablet by  mouth every 6 (six) hours as needed for severe pain. (Patient not taking: Reported on 03/04/2022)   [DISCONTINUED] OMEPRAZOLE PO Take 20 mg by mouth daily.   No facility-administered encounter medications on file as of 03/19/2022.   No Known Allergies Patient Active Problem List   Diagnosis Date Noted   Acute diverticulitis 03/04/2022   Hematochezia 03/04/2022   Melena 03/04/2022   Pulmonary HTN ([place]) 01/10/2022   CAP (community  acquired pneumonia) 01/09/2022   Severe sepsis ([place]) 01/09/2022   Depression 12/24/2021   AKI (acute kidney injury) ([place]) 12/23/2021   Hyperthyroidism 12/23/2021   Cervical cancer (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   GERD (gastroesophageal reflux disease) 02/05/2018   Breast cancer ([place]) 02/05/2018   Headache 02/05/2018   Nausea & vomiting 02/05/2018   Epigastric abdominal pain 02/05/2018   Sore throat 02/05/2018   Frequent PVCs    DCM (dilated cardiomyopathy) ([place])    Acute on chronic HFrEF (heart failure with reduced ejection fraction) ([place]) 04/15/2017   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 ([place]) 04/15/2017   Social History   Socioeconomic History   Marital status: Single    Spouse name: Not on file   Number of children: 4   Years of education: Not on file   Highest education level: Not on file  Occupational History   Not on file  Tobacco Use   Smoking status: Never   Smokeless tobacco: Never  Vaping Use   Vaping Use: Never used  Substance and Sexual Activity   Alcohol use: Not Currently    Comment: 04/15/2017 "might have a couple beers q couple months"   Drug use: Not Currently   Sexual activity: Not Currently  Other Topics Concern   Not on file  Social History Narrative   ** Merged History Encounter **       N/A   Social Determinants of Health   Financial Resource Strain: High Risk (10/30/2019)   Overall Financial Resource Strain (CARDIA)    Difficulty of Paying Living Expenses: Very hard  Food Insecurity: Unknown (06/06/2019)   Hunger Vital Sign    Worried About Running Out of Food in the Last Year:  Patient refused    Ran Out of Food in the Last Year: Patient refused  Transportation Needs: Unknown (06/06/2019)   PRAPARE - Transportation    Lack of Transportation (Medical): Patient refused    Lack of Transportation (Non-Medical): Patient refused  Physical Activity: Unknown (07/07/2019)   Exercise Vital Sign    Days of Exercise per Week: Patient refused    Minutes of Exercise per Session: Patient refused  Stress: No Stress Concern Present (06/06/2019)   [employer] [place]    Feeling of Stress : Only a little  Social Connections: Unknown (07/07/2019)   Social Connection and Isolation Panel [NHANES]    Frequency of Communication with Friends and Family: Patient refused    Frequency of Social Gatherings with Friends and Family: Patient refused    Attends Religious Services: Patient refused    Active Member of Clubs or Organizations: Patient refused    Attends Archivist Meetings: Patient refused    Marital Status: Patient  refused  Intimate Partner Violence: Unknown (07/07/2019)   Humiliation, Afraid, Rape, and Kick questionnaire    Fear of Current or Ex-Partner: Patient refused    Emotionally Abused: Patient refused    Physically Abused: Patient refused    Sexually Abused: Patient refused    Ms. Benjume Rodriguez's family history includes Arthritis in her mother and sister; Diabetes in her father; Hypertension in her mother.      Objective:    Vitals:   03/19/22 1507  BP: 90/70  Pulse: (!) 104    Physical Exam Well-developed , chronically ill-appearing non-English-speaking Hispanic female in no acute distress.  Accompanied by interpreter today  Weight, 131 BMI 29.3  HEENT; nontraumatic normocephalic, EOMI, PE R LA, sclera anicteric. Oropharynx; white coating on tongue, no buccal mucosa plaques noted Neck; supple, +JVD to the ear Cardiovascular; iregular rate and rhythm with K5-T9, diastolic murmur,  tachycardic Pulmonary; Clear bilaterally Abdomen; soft, nondistended, no fluid wave, she has mild rather generalized tenderness which is not focal no guarding no palpable mass or hepatosplenomegaly, bowel sounds are active Rectal; not done today Skin; benign exam, no jaundice rash or appreciable lesions Extremities; no clubbing cyanosis or edema skin warm and dry Neuro/Psych; alert and oriented x4, grossly nonfocal mood and affect appropriate        Assessment & Plan:   #67 61 year old with recent admission 7/13 through 03/09/2022 with abdominal pain, and CT findings suggestive of a right colon inflammatory process, possible diverticulitis. She was treated with a course of IV antibiotics and then discharged to complete a course of Cipro and Flagyl.  She was not seen by GI during this admission, followed by the internal medicine service. Per discharge summary to return to GI for colonoscopy.  History is difficult due to language barrier despite interpreter.  Patient feels poorly in general and has multiple general complaints, of fatigue, shortness of breath, coughing, lack of appetite, lack of taste And some persistence of abdominal pain which at this time is not localized as well as some mild constipation.  I do not think she has persistent diverticulitis, and I am not convinced that she has actually had diverticulitis during this admission though CT was suggestive.  She has severe right heart failure, and may have had mild right colon ischemia accounting for the CT changes.  #2 oral thrush post antibiotics #3 severe nonischemic cardiomyopathy, heart failure with EF 15 to 17%-not a candidate for advanced therapies in the past due to citizenship issues.  Patient is followed by the heart failure clinic. Ongoing complaints of fatigue, dyspnea and lack of appetite are all secondary to her severe heart failure  #3 atrial tachycardia, atrial fibrillation-on anticoagulation #4 mild anemia, iron  deficiency-not currently on iron replacement-not sure if she will tolerate oral iron #5 atrial regurgitation #6 prior history of cervical cancer and breast cancer  Plan; patient is not an appropriate candidate for colonoscopy with severe cardiomyopathy and EF of 15 to 17%.  We will treat oral thrush with Mycostatin oral suspension 5 cc swish and spit or swish and swallow 4 times daily x2 weeks Encouraged patient to push oral intake despite lack of appetite Start trial of MiraLAX 17 g in 8 ounces of water for constipation We have contacted the heart failure clinic to see if she can be seen in follow-up there sooner than mid September. I tried to explain to her that more than likely most of her GI symptoms are also secondary to severe heart failure. She will follow-up with  GI as needed, again focus needs to be on her cardiac management Will start oral iron ferrous sulfate twice daily. She should have follow-up CBC and iron studies in 3 months.    Ikram Riebe Genia Harold PA-C 03/19/2022   Cc: Earna Coder, NP

## 2022-03-19 NOTE — Patient Instructions (Addendum)
If you are age 61 or younger, your body mass index should be between 19-25. Your Body mass index is 29.37 kg/m. If this is out of the aformentioned range listed, please consider follow up with your Primary Care Provider.  ________________________________________________________  The [facility] providers would like to encourage you to use [facility] to communicate with providers for non-urgent requests or questions.  Due to long hold times on the telephone, sending your provider a message by [place] may be a faster and more efficient way to get a response.  Please allow 48 business hours for a response.  Please remember that this is for non-urgent requests.  _______________________________________________________  Use Nystatin suspension 5 mL by mouth 4 times daily (Swish around and swallow), for 2 weeks  Use Miralax 1 capful in 8 ounces of water or juice to help move bowels.  Try to follow up with Dr. Haroldine Laws at the CHF clinic sooner. We will be sending a message to them as well.  Follow up as needed.  Thank you for entrusting me with your care and choosing [facility].  Amy Esterwood, PA-C

## 2022-03-22 NOTE — Progress Notes (Signed)
____________________________________________________________  Attending physician addendum:  Thank you for sending this case to me. I have reviewed the entire note and agree with the plan.  I reviewed the images from the inpatient CT abdomen pelvis and agree this does not appear to be diverticulitis, and are more suggestive of passive congestion/localized ascites in this patient.  I agree she is NOT a candidate for colonoscopy given the severity of her cardiopulmonary condition.  Wilfrid Lund, MD  ____________________________________________________________

## 2022-03-23 ENCOUNTER — Telehealth: Payer: Self-pay

## 2022-03-23 ENCOUNTER — Emergency Department (HOSPITAL_COMMUNITY): Payer: Self-pay

## 2022-03-23 ENCOUNTER — Other Ambulatory Visit: Payer: Self-pay

## 2022-03-23 ENCOUNTER — Inpatient Hospital Stay (HOSPITAL_COMMUNITY)
Admission: EM | Admit: 2022-03-23 | Discharge: 2022-03-26 | DRG: 292 | Disposition: A | Discharge status: Home / Self Care | Payer: Self-pay | Attending: Internal Medicine | Admitting: Internal Medicine

## 2022-03-23 DIAGNOSIS — B370 Candidal stomatitis: Secondary | ICD-10-CM | POA: Diagnosis present

## 2022-03-23 DIAGNOSIS — Z923 Personal history of irradiation: Secondary | ICD-10-CM

## 2022-03-23 DIAGNOSIS — D509 Iron deficiency anemia, unspecified: Secondary | ICD-10-CM | POA: Diagnosis present

## 2022-03-23 DIAGNOSIS — R45851 Suicidal ideations: Secondary | ICD-10-CM | POA: Diagnosis present

## 2022-03-23 DIAGNOSIS — I071 Rheumatic tricuspid insufficiency: Secondary | ICD-10-CM | POA: Diagnosis present

## 2022-03-23 DIAGNOSIS — I440 Atrioventricular block, first degree: Secondary | ICD-10-CM | POA: Diagnosis not present

## 2022-03-23 DIAGNOSIS — E875 Hyperkalemia: Secondary | ICD-10-CM | POA: Diagnosis not present

## 2022-03-23 DIAGNOSIS — Z7901 Long term (current) use of anticoagulants: Secondary | ICD-10-CM

## 2022-03-23 DIAGNOSIS — R1084 Generalized abdominal pain: Secondary | ICD-10-CM | POA: Diagnosis present

## 2022-03-23 DIAGNOSIS — D500 Iron deficiency anemia secondary to blood loss (chronic): Secondary | ICD-10-CM

## 2022-03-23 DIAGNOSIS — Z79899 Other long term (current) drug therapy: Secondary | ICD-10-CM

## 2022-03-23 DIAGNOSIS — I493 Ventricular premature depolarization: Secondary | ICD-10-CM | POA: Diagnosis present

## 2022-03-23 DIAGNOSIS — I509 Heart failure, unspecified: Principal | ICD-10-CM

## 2022-03-23 DIAGNOSIS — F430 Acute stress reaction: Secondary | ICD-10-CM

## 2022-03-23 DIAGNOSIS — Z853 Personal history of malignant neoplasm of breast: Secondary | ICD-10-CM

## 2022-03-23 DIAGNOSIS — F419 Anxiety disorder, unspecified: Secondary | ICD-10-CM | POA: Diagnosis present

## 2022-03-23 DIAGNOSIS — N179 Acute kidney failure, unspecified: Secondary | ICD-10-CM | POA: Diagnosis not present

## 2022-03-23 DIAGNOSIS — Z8541 Personal history of malignant neoplasm of cervix uteri: Secondary | ICD-10-CM

## 2022-03-23 DIAGNOSIS — E876 Hypokalemia: Secondary | ICD-10-CM | POA: Diagnosis not present

## 2022-03-23 DIAGNOSIS — F5105 Insomnia due to other mental disorder: Secondary | ICD-10-CM | POA: Diagnosis present

## 2022-03-23 DIAGNOSIS — I5023 Acute on chronic systolic (congestive) heart failure: Principal | ICD-10-CM | POA: Diagnosis present

## 2022-03-23 DIAGNOSIS — Z20822 Contact with and (suspected) exposure to covid-19: Secondary | ICD-10-CM | POA: Diagnosis present

## 2022-03-23 DIAGNOSIS — E0590 Thyrotoxicosis, unspecified without thyrotoxic crisis or storm: Secondary | ICD-10-CM | POA: Diagnosis present

## 2022-03-23 DIAGNOSIS — I480 Paroxysmal atrial fibrillation: Secondary | ICD-10-CM | POA: Diagnosis present

## 2022-03-23 LAB — COMPREHENSIVE METABOLIC PANEL WITH GFR
ALT: 20 U/L (ref 0–44)
AST: 29 U/L (ref 15–41)
Albumin: 3.6 g/dL (ref 3.5–5.0)
Alkaline Phosphatase: 137 U/L — ABNORMAL HIGH (ref 38–126)
Anion gap: 10 (ref 5–15)
BUN: 10 mg/dL (ref 8–23)
CO2: 22 mmol/L (ref 22–32)
Calcium: 9 mg/dL (ref 8.9–10.3)
Chloride: 104 mmol/L (ref 98–111)
Creatinine, Ser: 1.15 mg/dL — ABNORMAL HIGH (ref 0.44–1.00)
GFR, Estimated: 54 mL/min — ABNORMAL LOW
Glucose, Bld: 117 mg/dL — ABNORMAL HIGH (ref 70–99)
Potassium: 3.3 mmol/L — ABNORMAL LOW (ref 3.5–5.1)
Sodium: 136 mmol/L (ref 135–145)
Total Bilirubin: 1.1 mg/dL (ref 0.3–1.2)
Total Protein: 6.2 g/dL — ABNORMAL LOW (ref 6.5–8.1)

## 2022-03-23 LAB — I-STAT CHEM 8, ED
BUN: 13 mg/dL (ref 8–23)
Calcium, Ion: 1.03 mmol/L — ABNORMAL LOW (ref 1.15–1.40)
Chloride: 102 mmol/L (ref 98–111)
Creatinine, Ser: 1 mg/dL (ref 0.44–1.00)
Glucose, Bld: 115 mg/dL — ABNORMAL HIGH (ref 70–99)
HCT: 37 % (ref 36.0–46.0)
Hemoglobin: 12.6 g/dL (ref 12.0–15.0)
Potassium: 3.3 mmol/L — ABNORMAL LOW (ref 3.5–5.1)
Sodium: 137 mmol/L (ref 135–145)
TCO2: 23 mmol/L (ref 22–32)

## 2022-03-23 LAB — CBC WITH DIFFERENTIAL/PLATELET
Abs Immature Granulocytes: 0.02 10*3/uL (ref 0.00–0.07)
Basophils Absolute: 0 10*3/uL (ref 0.0–0.1)
Basophils Relative: 0 %
Eosinophils Absolute: 0 10*3/uL (ref 0.0–0.5)
Eosinophils Relative: 0 %
HCT: 33 % — ABNORMAL LOW (ref 36.0–46.0)
Hemoglobin: 10.7 g/dL — ABNORMAL LOW (ref 12.0–15.0)
Immature Granulocytes: 0 %
Lymphocytes Relative: 9 %
Lymphs Abs: 0.6 10*3/uL — ABNORMAL LOW (ref 0.7–4.0)
MCH: 27.4 pg (ref 26.0–34.0)
MCHC: 32.4 g/dL (ref 30.0–36.0)
MCV: 84.6 fL (ref 80.0–100.0)
Monocytes Absolute: 0.6 10*3/uL (ref 0.1–1.0)
Monocytes Relative: 8 %
Neutro Abs: 5.5 10*3/uL (ref 1.7–7.7)
Neutrophils Relative %: 83 %
Platelets: 340 10*3/uL (ref 150–400)
RBC: 3.9 MIL/uL (ref 3.87–5.11)
RDW: 18.9 % — ABNORMAL HIGH (ref 11.5–15.5)
WBC: 6.7 10*3/uL (ref 4.0–10.5)
nRBC: 0 % (ref 0.0–0.2)

## 2022-03-23 LAB — BRAIN NATRIURETIC PEPTIDE: B Natriuretic Peptide: 2074.9 pg/mL — ABNORMAL HIGH (ref 0.0–100.0)

## 2022-03-23 LAB — TYPE AND SCREEN
ABO/RH(D): O POS
Antibody Screen: NEGATIVE

## 2022-03-23 LAB — TROPONIN I (HIGH SENSITIVITY)
Troponin I (High Sensitivity): 9 ng/L
Troponin I (High Sensitivity): 9 ng/L

## 2022-03-23 MED ORDER — FUROSEMIDE 10 MG/ML IJ SOLN
40.0000 mg | Freq: Once | INTRAMUSCULAR | Status: AC
  Administered 2022-03-23: 40 mg via INTRAVENOUS
  Filled 2022-03-23: qty 4

## 2022-03-23 MED ORDER — ACETAMINOPHEN 325 MG PO TABS
650.0000 mg | ORAL_TABLET | Freq: Four times a day (QID) | ORAL | Status: DC | PRN
  Administered 2022-03-25: 650 mg via ORAL
  Filled 2022-03-23: qty 2

## 2022-03-23 MED ORDER — POTASSIUM CHLORIDE CRYS ER 20 MEQ PO TBCR
40.0000 meq | EXTENDED_RELEASE_TABLET | Freq: Once | ORAL | Status: AC
Start: 2022-03-23 — End: 2022-03-23
  Administered 2022-03-23: 40 meq via ORAL
  Filled 2022-03-23: qty 2

## 2022-03-23 MED ORDER — ANASTROZOLE 1 MG PO TABS
1.0000 mg | ORAL_TABLET | Freq: Every evening | ORAL | Status: DC
  Administered 2022-03-24 – 2022-03-25 (×3): 1 mg via ORAL
  Filled 2022-03-23 (×4): qty 1

## 2022-03-23 MED ORDER — FUROSEMIDE 10 MG/ML IJ SOLN: 30.0000 mg | Freq: Two times a day (BID) | INTRAMUSCULAR | Status: DC

## 2022-03-23 MED ORDER — AMIODARONE HCL 200 MG PO TABS
200.0000 mg | ORAL_TABLET | Freq: Every day | ORAL | Status: DC
  Administered 2022-03-24 – 2022-03-26 (×3): 200 mg via ORAL
  Filled 2022-03-23 (×3): qty 1

## 2022-03-23 MED ORDER — ACETAMINOPHEN 650 MG RE SUPP: 650.0000 mg | Freq: Four times a day (QID) | RECTAL | Status: DC | PRN

## 2022-03-23 MED ORDER — APIXABAN 5 MG PO TABS
5.0000 mg | ORAL_TABLET | Freq: Two times a day (BID) | ORAL | Status: DC
  Administered 2022-03-24 – 2022-03-26 (×6): 5 mg via ORAL
  Filled 2022-03-23 (×6): qty 1

## 2022-03-23 MED ORDER — MEXILETINE HCL 200 MG PO CAPS
200.0000 mg | ORAL_CAPSULE | Freq: Two times a day (BID) | ORAL | Status: DC
  Administered 2022-03-24 – 2022-03-26 (×6): 200 mg via ORAL
  Filled 2022-03-23 (×10): qty 1

## 2022-03-23 NOTE — Telephone Encounter (Signed)
-----   Message from Alfredia Ferguson, [place] sent at 03/19/2022  5:26 PM EDT ----- Regarding: new med Beth, please call patient next week and let her know that I wanted to start her on an iron supplement as she was found to be iron deficient when she was in the hospital recently. I believe she has a daughter or granddaughter who speaks English  Would like her to start on ferrous sulfate 325 mg twice daily she can take this with food    Will need repeat CBC and iron studies in 3 months  Thank you

## 2022-03-23 NOTE — Hospital Course (Addendum)
At time of hospital follow-up visit, please address the following:  - Examine patient's volume status on her home HF regimen - Reassess patient's response to new sleep and anxiety medications (buspar, hydroxyzine, melatonin, remeron), consider SSRI - Recheck WBC for leukocytosis - Recheck BMP for potassium imbalance - Examine for oral thrush (course of nystatin oral swish ends on 8/11) - Goals of care discussions, consider involving palliative care  ----------  Megan Keith is a 61 y.o. female with PMHx of HFrEF, afib on eliquis, iron deficiency anemia, hyperthyroidism, and breast and cervical cancer who was admitted to [facility] on 03/23/2022 with dyspnea consistent with acute on chronic HFrEF exacerbation.   Please see below for problem-based hospital course.   # Acute on chronic HFrEF exacerbation Patient is followed by the [facility] and her disease is classified as NYHA II-early III. CMRI on 12/21/2021 showed severe LV dilation, LVEF 17%, moderate RV dysfunction, RVEF 30, mid wall LGE in LV lateral wall suggestive of prior myocarditis, moderate MR, and moderate to severe TR. As per AHF note from 01/25/2022, patient has not been a candidate for advanced therapies given her immigration status, and she is unable to be on beta blockers, ARB/ARNI, farxiga, entresto, or digoxin. She was on coreg, lasix, and spironolactone prior to this admission. Patient was felt to be cold and wet on initial evaluation for which she was diuresed with IV lasix. She subsequently remained warm on exam and with only trace edema. She has significant JVD which we suspect is due to her TR. We held further diuresis during her hospital stay. Repeat TTE showed no significant changes in her severe heart disease and RA pressure was 15. Her blood pressures have been soft in the 32'R systolic. Will continue her home lasix and hold her spironolactone at discharge with plans to reexamine at  her PCP follow up. She has also been scheduled for hospital follow up with the AHF team.   # Potassium imbalance Patient's K+ on admission was 3.3 for which she received KCl 40 mEq. Her next K+ at ~3 PM on 8/2 was 4.9 and at ~3:30 AM on 8/3 was 5.7 Telemetry showed no arrhythmias but EKG on the morning of 8/3 showed prolonged PR interval compared to prior EKG on 8/1. Given her severely reduced EF, patient was at high risk for arrhythmia so hyperkalemic emergency treatment was initiated with calcium gluconate, sodium bicarb, insulin/ dextrose, albuterol, and lokelma. K+ subsequently normalized. Patient remained stable on exam without any acute distress or changes in mentation/ volume status. We will have her recheck labs at her PCP follow up appointment.    # Anxiety and insomnia Patient reports ongoing anxiety and difficulty sleeping at night due to concerns about her declining health. She was not on any anxiolytics or antidepressants before this admission. We started her on nightly melatonin and remeron without much improvement. There were some concerns of patient having suicidal thoughts for which she was briefly placed on precautions, but on reevaluation she denied ever having any SI and instead was just worried about her acute on chronic illnesses. We added buspar BID and hydroxyzine PRN on 8/3 which did help ease her anxiety and insomnia. She will be discharged on these four medications and will follow up with her PCP to reevaluate/ optimize her regimen.   # Leukocytosis Patient's WBC remained normal until 8/4 when it rose from 8.8 to 12.9. Smear showed neutrophil predominance. She had complained of dysuria on the night  of 8/2 but UA was non-inflammatory and UCx showed lactobacillus which was likely contaminant. She had no further dysuria at time of discharge. She also denied any fevers, chills, new pain symptoms, or N/V/D. There was no clear source of possible infection. Patient had no other sources  of infection and no symptoms such as fevers, chills, any new pains, or N/V/D. We will have patient recheck labs at PCP follow up.   # Oral thrush Patient was started on nystatin oral swish at the time of GI follow up on 7/28, likely in the setting of having recently received antibiotics. She continued the oral swish this admission and will complete the course as outpatient.   # Paroxysmal afib Patient remained in NSR this admission with occasional tachycardia and rare PVCs. We conitnued her home amiodarone, mexiletine, and eliquis.   # History of cervical cancer s/p hysterectomy # History of breast cancer s/p lumpectomy and radiation Continued home anastrozole.  # Iron deficiency anemia Continued home ferrous sulfate 325 daily.  # Abdominal pain # Elevated alk phos Patient has ongoing abdominal pain and alk phos elevation for at least the past several months. She was also recently admitted with diverticulitis but at the time of hospital follow up, GI suspected her symptoms were instead due to mild colonic ischemia in the setting of her severe heart failure. Her abdominal pain remained stable during this admission and did not necessitate any additional testing or management.   ----------  Subjective No acute events overnight. Vitals signs remained stable. Patient reports she slept well through the night. Her anxiety, appetite, and SOB have also improved some. Her last BM was yesterday. She denies any more dysuria. No fevers, chest pain, or N/V.   Objective Blood pressure 107/73, pulse (!) 116, temperature 98.7 F (37.1 C), temperature source Oral, resp. rate 18, height 4' 8"  (1.422 m), weight 57.6 kg, SpO2 96 %.  Constitutional: Alert and oriented. No acute distress. Converses and answers questions appropriately. Eyes: Sclera anicteric. EOM grossly intact.  HENT: Normocephalic and atraumatic. MMM. White coating on tongue, no buccal plaques noted.  Neck: JVD to mandible. No cervical  lymphadenopathy or thyromegaly. Cardiovascular: Tachycardic, regular rhythm. S4 present. 4/6 murmur at LLSB.  Respiratory: CTAB. No crackles or wheezes. Normal work of breathing.  Gastrointestinal: Soft. NT/ND. Normoactive bowel sounds.  Musculoskeletal: Trace BLE edema. +2 pulses in bilateral upper and lower extremities. Skin: Skin is warm, dry, and intact. No rashes or lesions noted on clothed exam. Neurologic: Normal speech and language. No gross focal neurologic deficits are appreciated. Psychiatric: Normal mood and affect. Normal speech and behavior.  Labs, studies, and imaging from the last 24 hours per EMR and personally reviewed.

## 2022-03-23 NOTE — H&P (Incomplete)
Date: 03/23/2022               Patient Name:  Megan Keith MRN: [id number]  DOB: 10-04-1960 Age / Sex: 61 y.o., female   PCP: Earna Coder, NP         Medical Service: Internal Medicine Teaching Service         Attending Physician: Dr. Sid Falcon, MD    First Contact: Megan Blamer, DO      Pager: Lavone Nian [phone]      Second Contact: Sanjuana Letters, DO      Pager: Maryjean Morn [phone]           After Hours (After 5p/  First Contact Pager: [phone]  weekends / holidays): Second Contact Pager: [phone]   SUBJECTIVE   Chief Complaint: Shortness of breath  History of Present Illness: [employer] translator present for interview.  Megan Keith is a 61 y.o. female history of A-fib on Eliquis, HFrEF with EF 20%, cervical cancer status post hysterectomy, hyperthyroidism, recent admission for diverticulitis by IMTS here presenting with shortness of breath  She started experiencing this of breath 4 days ago and is progressively worsened over that interval.  Yesterday she mentions that when she bends over during the day, she felt like she was choking.  She also endorsed some worsening trouble breathing when she was lying flat at night.  This seems to been worse in the past couple days. She also had dry cough during this time.   She says her lungs feel "inflamed".  She endorses poor appetite which improved 2 days ago following her admission for diverticulitis and completion of p.o. antibiotics.  She additionally felt like her abdomen was distended and that she had increased lower extremity swelling.  She mentions she has been only getting 1 to 2 hours of sleep at night for approximately the past year and attributes this primarily to anxiety regarding her cervical cancer.  She additionally is having some generalized weakness and especially feels like she has no strength in her legs.  She denies any fever, chest pain, palpitations, skipped beats, urinary frequency or  dysuria.  She says she has not had any recent changes to her heart failure regimen.  Her only new meds recently are the antibiotics which she was discharged on for her previous admission as well as iron and B12 supplementation started by GI.  Has been abiding by her fluid restriction and her only dietary changes that she started eating more couple days ago, but she attempts to abide by her salt restriction.  She mentions that she does not take the spironolactone due to cost restriction, but her granddaughter says that she has a spironolactone at home and that she has been taking it.  She has an extensive history with the advanced heart failure team and multiple presentations for heart failure exacerbation and A-fib (see heart failure note from 01/25/2022 for full history).  Primarily, she was not able to tolerate ARB's or Arni's due to hypotension, she is intolerant to [place], not on beta-blockers due to multiple recent CHF exacerbations with EF less than 20%, has a history of digoxin toxicity, and has been reported to not be a candidate for ICD or advanced therapies because of her insurance/immigration status.  She was noted to also not be a candidate for A-fib ablation for the same reason.  ED Course: She was tachypneic, hemodynamically stable, satting 100% on room air in the ED.  Initial exam findings and BNP/chest x-ray revealed concern  for heart failure exacerbation.  She was given 40 mg of IV Lasix initially in the ED. Meds:  -Amiodarone 200 mg daily - Mexiletine 200 mg twice daily (prescribed by TOC after last admission) - Anastrozole 1 mg at night - Eliquis 5 mg twice daily - Lasix 60 mg p.o. daily - Spironolactone 25 mg p.o. daily  Past Medical History  Past Surgical History:  Procedure Laterality Date  . BREAST CYST EXCISION Left   . CARDIOVERSION N/A 03/09/2019   Procedure: CARDIOVERSION;  Surgeon: Jolaine Artist, MD;  Location: [facility] ENDOSCOPY;  Service: Cardiovascular;  Laterality:  N/A;  . CARDIOVERSION N/A 07/05/2019   Procedure: CARDIOVERSION;  Surgeon: Thompson Grayer, MD;  Location: [place] CV LAB;  Service: Cardiovascular;  Laterality: N/A;  . CORONARY/GRAFT ANGIOGRAPHY N/A 04/15/2017   Procedure: CORONARY/GRAFT ANGIOGRAPHY;  Surgeon: Nelva Bush, MD;  Location: [place] CV LAB;  Service: Cardiovascular;  Laterality: N/A;  . RIGHT HEART CATH N/A 04/15/2017   Procedure: RIGHT HEART CATH;  Surgeon: Nelva Bush, MD;  Location: [place] CV LAB;  Service: Cardiovascular;  Laterality: N/A;  . RIGHT HEART CATH N/A 03/12/2021   Procedure: RIGHT HEART CATH;  Surgeon: Jolaine Artist, MD;  Location: [place] CV LAB;  Service: Cardiovascular;  Laterality: N/A;  . RIGHT HEART CATH N/A 12/21/2021   Procedure: RIGHT HEART CATH;  Surgeon: Jolaine Artist, MD;  Location: [place] CV LAB;  Service: Cardiovascular;  Laterality: N/A;  . TEE WITHOUT CARDIOVERSION N/A 03/09/2019   Procedure: TRANSESOPHAGEAL ECHOCARDIOGRAM (TEE);  Surgeon: Jolaine Artist, MD;  Location: [facility] ENDOSCOPY;  Service: Cardiovascular;  Laterality: N/A;  . TEE WITHOUT CARDIOVERSION N/A 07/05/2019   Procedure: TRANSESOPHAGEAL ECHOCARDIOGRAM (TEE);  Surgeon: Thompson Grayer, MD;  Location: [place] CV LAB;  Service: Cardiovascular;  Laterality: N/A;  . TEE WITHOUT CARDIOVERSION N/A 04/21/2021   Procedure: TRANSESOPHAGEAL ECHOCARDIOGRAM (TEE);  Surgeon: Fay Records, MD;  Location: [facility] ENDOSCOPY;  Service: Cardiovascular;  Laterality: N/A;  . TUBAL LIGATION    Multiple cardioversions, multiple right heart caths, multiple TEE's, cervical cancer status post hysterectomy, breast cancer status post lumpectomy and adjuvant radiation treated with aromatase inhibitor maintenance  Social:  Lives With: Granddaughter Occupation: Not working because of her cardiac issues Support: Support from granddaughter and 2 sons.  Family helps with transportation.  She reports some financial concerns with  medication access.  She is uninsured and not a [place] citizen and has been denied from certain treatment possibilities for this reason. Level of Function: Ambulatory at baseline, capable of managing most ADLs. [id number] Contrego  Substances: Denies smoking history, alcohol use, other substance use  Family History:  Family History  Problem Relation Age of Onset  . Hypertension Mother   . Arthritis Mother   . Diabetes Father   . Arthritis Sister      Allergies: Allergies as of 03/23/2022  . (No Known Allergies)    Review of Systems: A complete ROS was negative except as per HPI.   OBJECTIVE:   Physical Exam: Blood pressure 103/79, pulse 84, temperature 98.4 F (36.9 C), temperature source Oral, resp. rate 18, SpO2 100 %.  Constitutional: Tired-appearing female laying in bed, in no acute distress. Cardiovascular: RRR, 4 out of 6 holosystolic murmur in the left lower sternal border, 2 out of 6 holosystolic murmur in the apex.  Jugular venous distention up to the angle of the mandible with hepatojugular reflux Pulmonary/Chest: Mildly increased work of breathing on room air, lungs clear to auscultation bilaterally, mildly  decreased breath sounds in lower lung fields during expiratory phase. Abdominal: soft, non-tender, non-distended  Labs: CBC    Component Value Date/Time   WBC 6.7 03/23/2022 1901   RBC 3.90 03/23/2022 1901   HGB 12.6 03/23/2022 1948   HCT 37.0 03/23/2022 1948   PLT 340 03/23/2022 1901   MCV 84.6 03/23/2022 1901   MCH 27.4 03/23/2022 1901   MCHC 32.4 03/23/2022 1901   RDW 18.9 (H) 03/23/2022 1901   LYMPHSABS 0.6 (L) 03/23/2022 1901   MONOABS 0.6 03/23/2022 1901   EOSABS 0.0 03/23/2022 1901   BASOSABS 0.0 03/23/2022 1901     CMP     Component Value Date/Time   NA 137 03/23/2022 1948   K 3.3 (L) 03/23/2022 1948   CL 102 03/23/2022 1948   CO2 22 03/23/2022 1901   GLUCOSE 115 (H) 03/23/2022 1948   BUN 13 03/23/2022 1948   CREATININE 1.00 03/23/2022  1948   CALCIUM 9.0 03/23/2022 1901   PROT 6.2 (L) 03/23/2022 1901   ALBUMIN 3.6 03/23/2022 1901   AST 29 03/23/2022 1901   ALT 20 03/23/2022 1901   ALKPHOS 137 (H) 03/23/2022 1901   BILITOT 1.1 03/23/2022 1901   GFRNONAA 54 (L) 03/23/2022 1901   GFRAA >60 05/20/2020 1523    Imaging:   EKG: personally reviewed my interpretation is***. Prior EKG***  ASSESSMENT & PLAN:   Assessment & Plan by Problem: Principal Problem:   Acute exacerbation of CHF (congestive heart failure) (HCC)   Marliyah Reid [name] is a 61 y.o. person living with a history of *** who presented with *** and admitted for *** on hospital day 0  #*** ***  #*** ***  #*** ***  Diet: {NAMES:3044014::"Normal","Heart Healthy","Carb-Modified","Renal","Carb/Renal","NPO","TPN","Tube Feeds"} VTE: {NAMES:3044014::"Heparin","Enoxaparin","SCDs","NOAC","None"} IVF: {NAMES:3044014::"None","NS","1/2 NS","LR","D5","D10"},{NAMES:3044014::"None","10cc/hr","25cc/hr","50cc/hr","75cc/hr","100cc/hr","110cc/hr","125cc/hr","Bolus"} Code: {NAMES:3044014::"Full","DNR","DNI","DNR/DNI","Comfort Care","Unknown"}  Prior to Admission Living Arrangement: {NAMES:3044014::"Home, living ***","SNF, ***","Homeless","***"} Anticipated Discharge Location: {NAMES:3044014::"Home","SNF","CIR","***"} Barriers to Discharge: ***  Dispo: Admit patient to {STATUS:3044014::"Observation with expected length of stay less than 2 midnights.","Inpatient with expected length of stay greater than 2 midnights."}  Signed: Linus Galas, MD Internal Medicine Resident PGY-1  03/23/2022, 11:36 PM

## 2022-03-23 NOTE — ED Provider Notes (Signed)
[facility] EMERGENCY DEPARTMENT Provider Note   CSN: [id number] Arrival date & time: 03/23/22  4259     History  Chief Complaint  Patient presents with   Shortness of Breath    Maddyson Keil is a 61 y.o. female history of diverticulitis, A-fib on Eliquis, heart failure here presenting with shortness of breath.  Patient was recently admitted for diverticulitis.  Patient states that she was discharged about 2 weeks ago.  She still has melanotic stools until about 2 to 3 days ago.  Patient states that she started having shortness of breath when she lays down.  Also has progressive leg swelling.  She felt that her heart failure may be getting worse.  Patient is taking Lasix 60 mg daily  The history is provided by the patient.       Home Medications Prior to Admission medications   Medication Sig Start Date End Date Taking? Authorizing Provider  amiodarone (PACERONE) 200 MG tablet Take 1 tablet (200 mg total) by mouth daily. TAKE 1 Tablet BY MOUTH ONCE EVERY DAY Strength: 200 mg 03/09/22 05/08/22  Romana Juniper, MD  anastrozole (ARIMIDEX) 1 MG tablet Take 1 tablet (1 mg total) by mouth every evening. 12/14/21   Mosher, Vida Roller A, PA-C  apixaban (ELIQUIS) 5 MG TABS tablet Take 1 tablet (5 mg total) by mouth 2 (two) times daily. 10/28/21   Bensimhon, Shaune Pascal, MD  furosemide (LASIX) 20 MG tablet Take 3 tablets (60 mg total) by mouth daily. 03/10/22 05/09/22  Romana Juniper, MD  mexiletine (MEXITIL) 200 MG capsule Take 1 capsule (200 mg total) by mouth 2 (two) times daily. 03/09/22 05/08/22  Romana Juniper, MD  nystatin (MYCOSTATIN) 100000 UNIT/ML suspension Take 5 mLs (500,000 Units total) by mouth 4 (four) times daily for 14 days. Swish in the mouth and swallow 03/19/22 04/02/22  Esterwood, Amy S, PA-C  oxyCODONE-acetaminophen (PERCOCET) 5-325 MG tablet Take 1 tablet by mouth every 6 (six) hours as needed for severe pain. Patient not taking: Reported  on 03/04/2022 02/18/22   Mesner, Corene Cornea, MD  spironolactone (ALDACTONE) 25 MG tablet Take 0.5 tablets (12.5 mg total) by mouth daily. Tomese la mitad de la tableta de esta medicina diariamente 03/09/22 05/08/22  Romana Juniper, MD      Allergies    Patient has no known allergies.    Review of Systems   Review of Systems  Respiratory:  Positive for shortness of breath.   All other systems reviewed and are negative.   Physical Exam Updated Vital Signs BP 99/76   Pulse 87   Temp 98.4 F (36.9 C) (Oral)   Resp (!) 21   SpO2 100%  Physical Exam Vitals and nursing note reviewed.  Constitutional:      Comments: Tachypneic  HENT:     Head: Normocephalic.     Mouth/Throat:     Mouth: Mucous membranes are moist.  Eyes:     Extraocular Movements: Extraocular movements intact.     Pupils: Pupils are equal, round, and reactive to light.  Cardiovascular:     Rate and Rhythm: Normal rate and regular rhythm.  Pulmonary:     Comments: Tachypneic, crackles bilateral bases Abdominal:     General: Bowel sounds are normal.     Palpations: Abdomen is soft.  Musculoskeletal:     Cervical back: Normal range of motion and neck supple.     Comments: 2+ edema bilaterally  Skin:    General: Skin is warm.  Capillary Refill: Capillary refill takes less than 2 seconds.  Neurological:     General: No focal deficit present.     Mental Status: She is oriented to person, place, and time.  Psychiatric:        Mood and Affect: Mood normal.        Behavior: Behavior normal.    ED Results / Procedures / Treatments   Labs (all labs ordered are listed, but only abnormal results are displayed) Labs Reviewed  CBC WITH DIFFERENTIAL/PLATELET - Abnormal; Notable for the following components:      Result Value   Hemoglobin 10.7 (*)    HCT 33.0 (*)    RDW 18.9 (*)    Lymphs Abs 0.6 (*)    All other components within normal limits  COMPREHENSIVE METABOLIC PANEL - Abnormal; Notable for the  following components:   Potassium 3.3 (*)    Glucose, Bld 117 (*)    Creatinine, Ser 1.15 (*)    Total Protein 6.2 (*)    Alkaline Phosphatase 137 (*)    GFR, Estimated 54 (*)    All other components within normal limits  BRAIN NATRIURETIC PEPTIDE - Abnormal; Notable for the following components:   B Natriuretic Peptide 2,074.9 (*)    All other components within normal limits  I-STAT CHEM 8, ED - Abnormal; Notable for the following components:   Potassium 3.3 (*)    Glucose, Bld 115 (*)    Calcium, Ion 1.03 (*)    All other components within normal limits  SARS CORONAVIRUS 2 BY RT PCR  TYPE AND SCREEN  TROPONIN I (HIGH SENSITIVITY)  TROPONIN I (HIGH SENSITIVITY)    EKG EKG Interpretation  Date/Time:  Tuesday March 23 2022 18:50:41 EDT Ventricular Rate:  102 PR Interval:  178 QRS Duration: 96 QT Interval:  306 QTC Calculation: 398 R Axis:   -30 Text Interpretation: Sinus tachycardia Left axis deviation Possible Anterior infarct , age undetermined Abnormal ECG When compared with ECG of 18-Feb-2022 03:37, PREVIOUS ECG IS PRESENT Confirmed by Wandra Arthurs ([id number]) on 03/23/2022 8:16:07 PM  Radiology DG Chest Port 1 View  Result Date: 03/23/2022 CLINICAL DATA:  Shortness of breath EXAM: PORTABLE CHEST 1 VIEW COMPARISON:  02/18/2022 FINDINGS: Cardiac shadow is enlarged but stable. Aortic calcifications are again seen. The lungs are well aerated bilaterally. No acute bony abnormality is noted. IMPRESSION: Stable cardiomegaly.  No other focal abnormality is noted. Electronically Signed   By: Inez Catalina M.D.   On: 03/23/2022 19:16    Procedures Procedures    Medications Ordered in ED Medications  furosemide (LASIX) injection 40 mg (40 mg Intravenous Given 03/23/22 2037)    ED Course/ Medical Decision Making/ A&P                           Medical Decision Making Tamisha Nordstrom [name] is a 61 y.o. female here presenting with shortness of breath and leg swelling.  Has history  of CHF and recently admitted for diverticulitis.  Concern for possible CHF exacerbation.  Plan to get CBC and CMP and troponin and BNP and chest x-ray  9:22 PM I reviewed patient's labs and independently interpreted chest x-ray.  Hemoglobin is 10.7 which is stable.  Patient's BNP is elevated at 2000.  Chest x-ray showed cardiomegaly.  Patient was given Lasix 40 mg IV.  Patient will be admitted for heart failure exacerbation.   Problems Addressed: Acute on chronic congestive heart failure, unspecified  heart failure type [place]): acute illness or injury  Amount and/or Complexity of Data Reviewed Labs: ordered. Decision-making details documented in ED Course. Radiology: ordered and independent interpretation performed. Decision-making details documented in ED Course. ECG/medicine tests: ordered and independent interpretation performed. Decision-making details documented in ED Course.  Risk Prescription drug management. Decision regarding hospitalization.    Final Clinical Impression(s) / ED Diagnoses Final diagnoses:  None    Rx / DC Orders ED Discharge Orders     None         Drenda Freeze, MD 03/23/22 2124

## 2022-03-23 NOTE — Telephone Encounter (Signed)
Spoke with granddaughter. Reviewed recommendations. Agrees to this plan. Return to lab in 3 months.

## 2022-03-23 NOTE — Telephone Encounter (Signed)
Left message on the voicemail requesting a return call.

## 2022-03-23 NOTE — ED Triage Notes (Signed)
Pt here from home for shob and bilateral leg edema x3 days. Pt states she's noticed increased swelling. Pt has increased WOB in triage, reports chest tightness.

## 2022-03-23 NOTE — H&P (Addendum)
Date: 03/23/2022               Patient Name:  Megan Keith MRN: [id number]  DOB: [birth year] Age / Sex: 61 y.o., female   PCP: Earna Coder, NP         Medical Service: Internal Medicine Teaching Service         Attending Physician: Dr. Sid Falcon, MD    First Contact: Johny Blamer, DO      Pager: Lavone Nian [phone]      Second Contact: Sanjuana Letters, DO      Pager: Maryjean Morn [phone]           After Hours (After 5p/  First Contact Pager: [phone]  weekends / holidays): Second Contact Pager: [phone]   SUBJECTIVE   Chief Complaint: Shortness of breath  History of Present Illness: [employer] translator present for interview.  Megan Keith is a 61 y.o. female history of A-fib on Eliquis, HFrEF with EF 20%, hyperthyroidism, recent admission for diverticulitis by IMTS here presenting with shortness of breath  She started experiencing this of breath 4 days ago and is progressively worsened over that interval.  Yesterday she mentions that when she bends over during the day, she felt like she was choking.  She also endorsed some worsening trouble breathing when she was lying flat at night.  This seems to been worse in the past couple days. She also had dry cough during this time.   She says her lungs feel "inflamed".  She endorses poor appetite which improved 2 days ago following her admission for diverticulitis and completion of p.o. antibiotics.  She additionally felt like her abdomen was distended and that she had increased lower extremity swelling.  She mentions she has been only getting 1 to 2 hours of sleep at night for approximately the past year and attributes this primarily to anxiety regarding her cervical cancer.  She additionally is having some generalized weakness and especially feels like she has no strength in her legs.  She denies any fever, chest pain, palpitations, skipped beats, urinary frequency or dysuria.  She says she has not had any  recent changes to her heart failure regimen.  Her only new meds recently are the antibiotics which she was discharged on for her previous admission as well as iron and B12 supplementation started by GI.  Has been abiding by her fluid restriction and her only dietary changes that she started eating more couple days ago, but she attempts to abide by her salt restriction.  She mentions that she does not take the spironolactone due to cost restriction, but her granddaughter says that she has a spironolactone at home and that she has been taking it.  She has an extensive history with the advanced heart failure team and multiple presentations for heart failure exacerbation and A-fib (see heart failure note from 01/25/2022 for full history).  Primarily, she was not able to tolerate ARB's or Arni's due to hypotension, she is intolerant to [place], not on beta-blockers due to multiple recent CHF exacerbations with EF less than 20%, has a history of digoxin toxicity, and has been reported to not be a candidate for ICD or advanced therapies because of her insurance/immigration status.  She was noted to also not be a candidate for A-fib ablation for the same reason.  ED Course: She was tachypneic, hemodynamically stable, satting 100% on room air in the ED.  Initial exam findings and BNP/chest x-ray revealed concern for heart failure exacerbation.  She was given 40 mg of IV Lasix initially in the ED. Meds:  -Amiodarone 200 mg daily - Mexiletine 200 mg twice daily (prescribed by TOC after last admission) - Anastrozole 1 mg at night - Eliquis 5 mg twice daily - Lasix 60 mg p.o. daily - Spironolactone 25 mg p.o. daily  Past Medical History  Past Surgical History:  Procedure Laterality Date   BREAST CYST EXCISION Left    CARDIOVERSION N/A 03/09/2019   Procedure: CARDIOVERSION;  Surgeon: Jolaine Artist, MD;  Location: [facility] ENDOSCOPY;  Service: Cardiovascular;  Laterality: N/A;   CARDIOVERSION N/A 07/05/2019    Procedure: CARDIOVERSION;  Surgeon: Thompson Grayer, MD;  Location: [place] CV LAB;  Service: Cardiovascular;  Laterality: N/A;   CORONARY/GRAFT ANGIOGRAPHY N/A 04/15/2017   Procedure: CORONARY/GRAFT ANGIOGRAPHY;  Surgeon: Nelva Bush, MD;  Location: [place] CV LAB;  Service: Cardiovascular;  Laterality: N/A;   RIGHT HEART CATH N/A 04/15/2017   Procedure: RIGHT HEART CATH;  Surgeon: Nelva Bush, MD;  Location: [place] CV LAB;  Service: Cardiovascular;  Laterality: N/A;   RIGHT HEART CATH N/A 03/12/2021   Procedure: RIGHT HEART CATH;  Surgeon: Jolaine Artist, MD;  Location: [place] CV LAB;  Service: Cardiovascular;  Laterality: N/A;   RIGHT HEART CATH N/A 12/21/2021   Procedure: RIGHT HEART CATH;  Surgeon: Jolaine Artist, MD;  Location: [place] CV LAB;  Service: Cardiovascular;  Laterality: N/A;   TEE WITHOUT CARDIOVERSION N/A 03/09/2019   Procedure: TRANSESOPHAGEAL ECHOCARDIOGRAM (TEE);  Surgeon: Jolaine Artist, MD;  Location: [facility] ENDOSCOPY;  Service: Cardiovascular;  Laterality: N/A;   TEE WITHOUT CARDIOVERSION N/A 07/05/2019   Procedure: TRANSESOPHAGEAL ECHOCARDIOGRAM (TEE);  Surgeon: Thompson Grayer, MD;  Location: [place] CV LAB;  Service: Cardiovascular;  Laterality: N/A;   TEE WITHOUT CARDIOVERSION N/A 04/21/2021   Procedure: TRANSESOPHAGEAL ECHOCARDIOGRAM (TEE);  Surgeon: Fay Records, MD;  Location: [facility] ENDOSCOPY;  Service: Cardiovascular;  Laterality: N/A;   TUBAL LIGATION    Multiple cardioversions, multiple right heart caths, multiple TEE's, cervical cancer status post hysterectomy, breast cancer status post lumpectomy and adjuvant radiation treated with aromatase inhibitor maintenance  Social:  Lives With: Granddaughter Occupation: Not working because of her cardiac issues Support: Support from granddaughter and 2 sons.  Family helps with transportation.  She reports some financial concerns with medication access.  She is uninsured and not a [place]  citizen and has been denied from certain treatment possibilities for this reason. Level of Function: Ambulatory at baseline, capable of managing most ADLs. WJX:[id number] Contrego  Substances: Denies smoking history, alcohol use, other substance use  Family History:  Family History  Problem Relation Age of Onset   Hypertension Mother    Arthritis Mother    Diabetes Father    Arthritis Sister      Allergies: Allergies as of 03/23/2022   (No Known Allergies)    Review of Systems: A complete ROS was negative except as per HPI.   OBJECTIVE:   Physical Exam: Blood pressure 103/79, pulse 84, temperature 98.4 F (36.9 C), temperature source Oral, resp. rate 18, SpO2 100 %.  Constitutional: Tired-appearing female laying in bed, in no acute distress. Cardiovascular: RRR, 4 out of 6 holosystolic murmur in the left lower sternal border, 2 out of 6 holosystolic murmur in the apex.  Jugular venous distention up to the angle of the mandible with hepatojugular reflux Pulmonary/Chest: Mildly increased work of breathing on room air, lungs clear to auscultation bilaterally, mildly decreased breath sounds in lower  lung fields most pronounced during expiratory phase. Abdominal: soft, tender to palpation in epigastric and right upper quadrant, non-distended Extremities: 1+ pitting edema up to level of the knee  bilaterally.  Pronounced veins.  Extremities cool, pulses 1+. Skin: Scattered ecchymoses in left upper extremity Psych: Patient interactive and pleasant, but occasionally tearful during interview when discussing her cancer diagnosis and heart failure specifically  Labs:    Latest Ref Rng & Units 03/23/2022    7:48 PM 03/23/2022    7:01 PM 03/09/2022    6:12 AM 03/08/2022    2:45 AM 03/07/2022    1:10 AM 03/06/2022    2:23 AM 03/05/2022    6:36 PM  CBC  Hb 12.0 - 15.0 g/dL 12.6  10.7  10.7  11.2  11.0  11.0  12.5   Hct 36.0 - 46.0 % 37.0  33.0  32.4  35.4  33.3  33.6  39.4   MCV 80.0 - 100.0  fL  84.6  85.5  87.4  84.5  86.4  89.3   Plts 150 - 400 K/uL  340  288  309  300  274  325       Latest Ref Rng & Units 03/23/2022    7:48 PM 03/23/2022    7:01 PM 03/09/2022    6:12 AM 03/08/2022    2:45 AM 03/07/2022    1:10 AM 03/06/2022    2:23 AM 03/05/2022    4:04 AM  CMP  Glucose 70 - 99 mg/dL 115  117  93  108  88  101  87   BUN 8 - 23 mg/dL 13  10  9  14  10  14  10    Creatinine 0.44 - 1.00 mg/dL 1.00  1.15  0.99  1.05  0.99  0.92  0.96   Sodium 135 - 145 mmol/L 137  136  136  136  139  135  140   Potassium 3.5 - 5.1 mmol/L 3.3  3.3  3.3  4.2  3.5  4.6  3.2   Chloride 98 - 111 mmol/L 102  104  103  105  105  109  108   CO2 22 - 32 mmol/L  22  25  23  22  20  21    Calcium 8.9 - 10.3 mg/dL  9.0  8.8  8.9  8.7  8.8  8.4   Total Protein 6.5 - 8.1 g/dL  6.2        Total Bilirubin 0.3 - 1.2 mg/dL  1.1        Alkaline Phos 38 - 126 U/L  137        AST 15 - 41 U/L  29        ALT 0 - 44 U/L  20        Troponins 9, pending BNP 2074, higher than baseline  Imaging: Chest x-ray (03/23/2022): Cardiomegaly consistent with prior chest x-rays, minimal pulmonary vascular congestion.  No clear pleural effusions.  EKG: Borderline sinus tachycardia and left axis deviation, otherwise similar to prior EKG on 02/18/2022  ASSESSMENT & PLAN:  Megan Keith is a 61 y.o. female with last medical history of A-fib on Eliquis, HFrEF with EF 20%, cervical cancer status post hysterectomy, breast cancer status post left lumpectomy and adjuvant radiation on anastrozole maintenance, hyperthyroidism, iron deficiency anemia, recent admission for diverticulitis by IMTS who presented with gradually worsening shortness of breath and admitted for HFrEF exacerbation on hospital day 0.  #HFrEF exacerbation #  Nonischemic cardiomyopathy TTE on 09/23/2021 showing LVEF 86%, grade 3 diastolic dysfunction, RV moderately reduced systolic function, LA/RA dilation, moderate to severe MR/TR.  Similar findings and cardiac MRI  12/21/2021 as well as mid wall LGE showing scar pattern from possible prior myocarditis right heart cath on 12/21/2021 showing normal cardiac output with markedly elevated left-sided filling pressures.  Patient presents today with worsening dyspnea, orthopnea, lower extremity swelling and appears to be mildly volume overloaded on exam.  Ruled out new arrhythmia or worsening A-fib, medication changes or missed doses as etiologies.  Low suspicion for infection or ACS at this time.  Patient did endorse improving appetite as of a few days ago following her admission for diverticulitis and her antibiotic course, but her diet is well controlled and seems to be an unlikely cause of exacerbation.  She has extensive history with CHF exacerbation as mentioned in subjective and has failed multiple trials of GDMT and in general is noted not to be a candidate for advanced therapies or ICD because of her insurance/citizenship status (see heart failure note 01/25/2022).  Blood pressures in the 75Q to 492E systolic today, likely will not tolerate Entresto or ARB.  Initial creatinine 1.15 from baseline of 0.9-1, slight bump likely due to volume overload/cardiorenal etiology. Status post 40 units IV Lasix in the ED, we will continue IV diuresis and hold spironolactone for now, will likely restart at discharge. - IV Lasix 30 mg twice daily - Hold spironolactone 25 mg daily, consider resuming closer to discharge if creatinine normalizes and pressures are stable - Trend daily electrolytes and creatinine - Continuous monitoring - Strict I's and O's, heart healthy diet, daily weights - Consider further options for advanced heart failure treatment  #Paroxysmal A-fib with PVCs Patient in normal sinus rhythm during this admission with occasional tachycardia and rare PVCs. - Continue home amiodarone 200, mexiletine 200 twice daily - Continue Eliquis 5 twice daily - Continuous monitoring  #Hypokalemia Potassium 3.3 on admission,  status post 40 mEq KCl - Follow-up potassium, mag  #Anxiety #Insomnia Patient endorses symptoms of anxiety for more than a year due to her cancer diagnoses.  She reports that she stays awake at night worrying about her cancer metastasizing.  She is only been able to sleep 1 to 2 hours per night for the past year or so.  She is tearful during the interview when discussing this topic.  Of note, TSH was normal during the previous admission.  Can consider trazodone or Remeron for treatment of insomnia likely due to anxiety, however risk-benefit discussion is needed with the patient before starting either refuse due to potential of interacting with amiodarone and causing prolonged QTc/worsening arrhythmias.  We will trial melatonin tonight. - Start melatonin, consider trazodone or Remeron nightly - Consider SSRI in the outpatient setting to treat her anxiety.  #Abdominal Pain #Elevated Alk Phos Alk phos has been elevated since 12/17/21. Patient recently had ascending colon diverticulitis and imaging during the admission showed fat stranding, fluid adjacent to the liver and gallbladder wall thickening, but no gallstones. Isolated alk phos elevation without LFT increase at this admission. Likely secondary to aforementioned changes. -can consider GGT and repeat RUQ [place] if abd pain worsens or concern for hepatobiliary pathology increases.  #Cervical cancer status post hysterectomy #Breast cancer status post lumpectomy and radiation Condition stable at this time, will continue maintenance therapy for breast cancer. - Continue home anastrozole  #Iron deficiency anemia  IDA based on iron panel during the previous admission, patient started on  p.o. supplementation, will continue during this admission. - Continue p.o. ferrous sulfate.  Diet: Heart Healthy VTE:  Eliquis 5 twice daily IVF: None,None Code: Full  Prior to Admission Living Arrangement: Home, living with granddaughter Anticipated Discharge  Location: Home Barriers to Discharge: Normalization of volume status  Dispo: Admit patient to Observation with expected length of stay less than 2 midnights.  Signed: Linus Galas, MD Internal Medicine Resident PGY-1  03/23/2022, 11:36 PM

## 2022-03-24 ENCOUNTER — Observation Stay (HOSPITAL_COMMUNITY): Payer: Self-pay

## 2022-03-24 ENCOUNTER — Encounter (HOSPITAL_COMMUNITY): Payer: Self-pay

## 2022-03-24 DIAGNOSIS — I5023 Acute on chronic systolic (congestive) heart failure: Secondary | ICD-10-CM

## 2022-03-24 LAB — BASIC METABOLIC PANEL
Anion gap: 9 (ref 5–15)
Anion gap: 12 (ref 5–15)
BUN: 13 mg/dL (ref 8–23)
BUN: 16 mg/dL (ref 8–23)
CO2: 23 mmol/L (ref 22–32)
CO2: 20 mmol/L — ABNORMAL LOW (ref 22–32)
Calcium: 8.7 mg/dL — ABNORMAL LOW (ref 8.9–10.3)
Calcium: 8.9 mg/dL (ref 8.9–10.3)
Chloride: 106 mmol/L (ref 98–111)
Chloride: 104 mmol/L (ref 98–111)
Creatinine, Ser: 0.94 mg/dL (ref 0.44–1.00)
Creatinine, Ser: 1.08 mg/dL — ABNORMAL HIGH (ref 0.44–1.00)
GFR, Estimated: >60 mL/min (ref >60)
GFR, Estimated: 58 mL/min — ABNORMAL LOW (ref >60)
Glucose, Bld: 98 mg/dL (ref 70–99)
Glucose, Bld: 157 mg/dL — ABNORMAL HIGH (ref 70–99)
Potassium: 3 mmol/L — ABNORMAL LOW (ref 3.5–5.1)
Potassium: 4.9 mmol/L (ref 3.5–5.1)
Sodium: 138 mmol/L (ref 135–145)
Sodium: 136 mmol/L (ref 135–145)

## 2022-03-24 LAB — URINALYSIS, ROUTINE W REFLEX MICROSCOPIC
Bilirubin Urine: NEGATIVE
Glucose, UA: NEGATIVE mg/dL
Hgb urine dipstick: NEGATIVE
Ketones, ur: NEGATIVE mg/dL
Leukocytes,Ua: NEGATIVE
Nitrite: NEGATIVE
Protein, ur: 100 mg/dL — AB
Specific Gravity, Urine: 1.025 (ref 1.005–1.030)
pH: 5 (ref 5.0–8.0)

## 2022-03-24 LAB — ECHOCARDIOGRAM COMPLETE
Area-P 1/2: 6.71 cm2
Height: 56 in
MV M vel: 3.53 m/s
MV Peak grad: 49.8 mmHg
Radius: 0.5 cm
S' Lateral: 5.1 cm
Weight: 2038.81 oz

## 2022-03-24 LAB — SARS CORONAVIRUS 2 BY RT PCR: SARS Coronavirus 2 by RT PCR: NEGATIVE

## 2022-03-24 LAB — PHOSPHORUS: Phosphorus: 3.7 mg/dL (ref 2.5–4.6)

## 2022-03-24 LAB — MAGNESIUM: Magnesium: 2.1 mg/dL (ref 1.7–2.4)

## 2022-03-24 MED ORDER — NYSTATIN 100000 UNIT/ML MT SUSP
5.0000 mL | Freq: Four times a day (QID) | OROMUCOSAL | Status: DC
  Administered 2022-03-24 – 2022-03-26 (×9): 500000 [IU] via ORAL
  Filled 2022-03-24 (×7): qty 5

## 2022-03-24 MED ORDER — FUROSEMIDE 10 MG/ML IJ SOLN
40.0000 mg | Freq: Every day | INTRAMUSCULAR | Status: DC
Start: 2022-03-24 — End: 2022-03-24
  Filled 2022-03-24: qty 4

## 2022-03-24 MED ORDER — DEXTROMETHORPHAN POLISTIREX ER 30 MG/5ML PO SUER
15.0000 mg | Freq: Two times a day (BID) | ORAL | Status: DC | PRN
  Administered 2022-03-24 – 2022-03-26 (×4): 15 mg via ORAL
  Filled 2022-03-24 (×5): qty 5

## 2022-03-24 MED ORDER — GUAIFENESIN 100 MG/5ML PO LIQD
5.0000 mL | ORAL | Status: DC | PRN
  Administered 2022-03-24 (×3): 5 mL via ORAL
  Filled 2022-03-24 (×3): qty 10

## 2022-03-24 MED ORDER — POTASSIUM CHLORIDE CRYS ER 20 MEQ PO TBCR
40.0000 meq | EXTENDED_RELEASE_TABLET | ORAL | Status: AC
Start: 2022-03-24 — End: 2022-03-24
  Administered 2022-03-24 (×2): 40 meq via ORAL
  Filled 2022-03-24 (×2): qty 2

## 2022-03-24 MED ORDER — MENTHOL 3 MG MT LOZG
1.0000 | LOZENGE | OROMUCOSAL | Status: DC | PRN
  Administered 2022-03-25: 3 mg via ORAL
  Filled 2022-03-24: qty 9

## 2022-03-24 MED ORDER — MELATONIN 5 MG PO TABS
5.0000 mg | ORAL_TABLET | Freq: Every day | ORAL | Status: DC
  Administered 2022-03-24 – 2022-03-25 (×2): 5 mg via ORAL
  Filled 2022-03-24 (×2): qty 1

## 2022-03-24 MED ORDER — TRAZODONE HCL 50 MG PO TABS
50.0000 mg | ORAL_TABLET | Freq: Every day | ORAL | Status: DC
Start: 2022-03-24 — End: 2022-03-24

## 2022-03-24 MED ORDER — MIRTAZAPINE 15 MG PO TABS
7.5000 mg | ORAL_TABLET | Freq: Every day | ORAL | Status: DC
  Administered 2022-03-24 – 2022-03-25 (×2): 7.5 mg via ORAL
  Filled 2022-03-24 (×2): qty 1

## 2022-03-24 MED ORDER — FERROUS SULFATE 325 (65 FE) MG PO TABS
325.0000 mg | ORAL_TABLET | Freq: Every day | ORAL | Status: DC
  Administered 2022-03-24 – 2022-03-26 (×3): 325 mg via ORAL
  Filled 2022-03-24 (×3): qty 1

## 2022-03-24 MED ORDER — ONDANSETRON HCL 4 MG/2ML IJ SOLN
4.0000 mg | Freq: Three times a day (TID) | INTRAMUSCULAR | Status: DC | PRN
  Administered 2022-03-25: 4 mg via INTRAVENOUS
  Filled 2022-03-24: qty 2

## 2022-03-24 NOTE — Consult Note (Addendum)
   [facility] CM Inpatient Consult   03/24/2022  Enaya Howze 23-Sep-1960 [id number]  Patient discussed in unit progression meeting as showing on [place]   *Patient has no insurance plan listed with [facility]   Reason:  Not a beneficiary currently attributed to one of the [place] Registry populations.  Membership roster was used to verify patient status as patient is NOT listed.  For additional questions or referrals please contact:    *Will sign off.  For questions, please call:  Natividad Brood, RN BSN [facility] Liaison  [phone] business mobile phone Toll free office [phone]  Fax number: [phone] [email or web address]

## 2022-03-24 NOTE — Plan of Care (Signed)
  Problem: Acute Rehab OT Goals (only OT should resolve) Goal: Pt/Caregiver Will Perform Home Exercise Program Flowsheets (Taken 03/24/2022 1219) Pt/caregiver will Perform Home Exercise Program:  Increased ROM  Increased strength  Both right and left upper extremity  With Supervision  With written HEP provided Goal: OT Additional ADL Goal #1 Flowsheets (Taken 03/24/2022 1219) Additional ADL Goal #1: Pt will decrease left shoulder fascial restrictions to minimal amount in order to increase ability to use LUE to reach overhead with less difficulty during UB ADL tasks. Goal: OT Additional ADL Goal #2 Flowsheets (Taken 03/24/2022 1219) Additional ADL Goal #2: Pt will report a pain level of approximately 2/10 or less in left shoulder while completing self care tasks.

## 2022-03-24 NOTE — Progress Notes (Signed)
Pt called nurse with complaint of having burning sensation when urinating. Out put of 50cc. Urine is orange in color and cloudy. MD paged to make aware. Per MD to continue monitor dysuria.

## 2022-03-24 NOTE — Progress Notes (Signed)
Echocardiogram 2D Echocardiogram has been performed.  Oneal Deputy Sicily Zaragoza RDCS 03/24/2022, 2:58 PM

## 2022-03-24 NOTE — Progress Notes (Signed)
Nursing rounding on pt and found pt tearful.   Pt told nurse:"take my medications because otherwise I will take them all".  Pt stated " I am very sick, I can't pee, my heart is not working, I am hurting. I want to go to heaven".  Nurse stayed with pt to offer emotional support.  MD was paged to make aware.  Suicide precautions placed by MD.   Nurse sent medications to pharmacy.  Charge RN aware. Tech sitting with pt.

## 2022-03-24 NOTE — Evaluation (Signed)
Occupational Therapy Evaluation Patient Details Name: Megan Keith MRN: [id number] DOB: 20-Nov-1960 Today's Date: 03/24/2022   History of Present Illness 61 y.o. female history of A-fib on Eliquis, HFrEF with EF 20%, hyperthyroidism, admitted 8/1 with HFrEF exacerbation, Nonischemic cardiomyopathy, Paroxysmal A-fib with PVCs, and hypokalemia.   Clinical Impression   Pt in bed upon therapy arrival and agreeable to participate in OT evaluation. Video interpreter used during session due to language preference. Patient reports increased fatigue and slight dizziness recently which has caused her to utilize a cane during ambulation at home. She reports she is able to complete all BADL tasks at home with increased time and frequent rest breaks due to SOB and fatigue. Pt experienced frequent coughing episodes intermittently throughout evaluation. Pt presents with increased left shoulder pain, fascial restrictions, and decreased ROM. Decreased strength is demonstrated in BUE shoulders. Pt is experiencing increased difficulty completing required ADL tasks due to left shoulder pain and UE weakness. Recommend pt inform MD regarding left shoulder pain as pt states she has not mentioned it (it began when she started taking prescribed medication for her heart). Recommend OP OT for left shoulder pain and BUE weakness. Provided HEP this session including scapular and shoulder A/ROM in Spanish. Education provided verbally with visual demonstration and handout provided. Pt verbalized understanding.      Recommendations for follow up therapy are one component of a multi-disciplinary discharge planning process, led by the attending physician.  Recommendations may be updated based on patient status, additional functional criteria and insurance authorization.   Follow Up Recommendations  Outpatient OT (Pt could benefit from OP OT for left shoulder pain although with no insurance, I'm unsure if she would want a  referral to be made.)    Assistance Recommended at Discharge None     Functional Status Assessment  Patient has had a recent decline in their functional status and demonstrates the ability to make significant improvements in function in a reasonable and predictable amount of time.  Equipment Recommendations  None recommended by OT       Precautions / Restrictions Precautions Precautions: None Precaution Comments: monitor BP Restrictions Weight Bearing Restrictions: No      Mobility Bed Mobility Overal bed mobility: Independent       Patient Response: Cooperative  Transfers Overall transfer level: Independent Equipment used: None       General transfer comment: toilet transfer completed with patient navigating from her bed to toilet with no device used.      Balance Overall balance assessment:  (No balance deficits noted during evaluation.)       ADL either performed or assessed with clinical judgement   ADL Overall ADL's : Modified independent;At baseline         Vision Baseline Vision/History: 0 No visual deficits Ability to See in Adequate Light: 0 Adequate Patient Visual Report: No change from baseline              Pertinent Vitals/Pain Pain Assessment Pain Assessment:  (denied pain initially. When completing UE assessment, pt reports 5/10 pain level in left anterior shoulder and upper arm)     Hand Dominance Right   Extremity/Trunk Assessment Upper Extremity Assessment Upper Extremity Assessment: LUE deficits/detail;RUE deficits/detail RUE Deficits / Details: A/ROM WFL in all ranges (shoulder, elbow, wrist). Functional gross grasp RUE Sensation: WNL RUE Coordination: WNL LUE Deficits / Details: A/ROM shoulder flexion, abduction ~75% range when initially assesed. ROM improved as assessement continued with patient demonstrating A/ROM [facility]. Full A/ROM IR/er. Wrist  and elbow A/ROM WNL. Strong gross grasp. MMT: Shoulder flexion/abduction/IR/er: 3/5,  Elbow flexion: 4+/5, elbow extension: 3+/5. Pain reported in in shoulder during UE assessment during movement and strength testing. Moderate trigger points and fascial restrictions palpated in left scapular and upper arm region. Scapular winging noted during A/ROM shoulder flexion/extension. Assessed seated. LUE: Shoulder pain with ROM   Lower Extremity Assessment Lower Extremity Assessment: Defer to PT evaluation       Communication Communication Communication: Prefers language other than [place];Interpreter utilized [name] [phone] video interpreter service)   Cognition Arousal/Alertness: Awake/alert Behavior During Therapy: WFL for tasks assessed/performed Overall Cognitive Status: Within Functional Limits for tasks assessed       General Comments  BP at rest 84/68 prior to session supine position with HR in 90s. SpO2 100% on RA. sitting 103/82; after ambulating BP 104/85 with HR 103. Reports only a little dizziness with upright activities. Additionally pt coughing frequently, and spitting into trashcan. SpO2 maintained 100% throughout on RA.            Home Living Family/patient expects to be discharged to:: Private residence Living Arrangements: Children (Lives with [place]) Available Help at Discharge: Family;Friend(s);Available 24 hours/day Type of Home: Mobile home Home Access: Stairs to enter Entrance Stairs-Number of Steps: 3 Entrance Stairs-Rails: None Home Layout: One level     Bathroom Shower/Tub: Tub only   Biochemist, clinical: Standard     Home Equipment: Cane - single point   Additional Comments: Reports that she typically ambulates without no AD although recently she has been using a cane for her balance because she feels so weak.      Prior Functioning/Environment Prior Level of Function : Independent/Modified Independent       Mobility Comments: Reports Mod I with SPC          OT Problem List: Decreased strength;Pain;Decreased range of  motion;Decreased activity tolerance;Impaired UE functional use      OT Treatment/Interventions: Therapeutic exercise;Therapeutic activities;Neuromuscular education;Manual therapy;Modalities;Patient/family education    OT Goals(Current goals can be found in the care plan section) Acute Rehab OT Goals Patient Stated Goal: none stated OT Goal Formulation: With patient Time For Goal Achievement: 04/07/22 Potential to Achieve Goals: Fair  OT Frequency: Min 3X/week       AM-PAC OT "6 Clicks" Daily Activity     Outcome Measure Help from another person eating meals?: None Help from another person taking care of personal grooming?: None Help from another person toileting, which includes using toliet, bedpan, or urinal?: None Help from another person bathing (including washing, rinsing, drying)?: None Help from another person to put on and taking off regular upper body clothing?: A Little Help from another person to put on and taking off regular lower body clothing?: None 6 Click Score: 23   End of Session    Activity Tolerance: Patient limited by fatigue;Patient tolerated treatment well Patient left: in bed;with call bell/phone within reach  OT Visit Diagnosis: Muscle weakness (generalized) (M62.81);Pain Pain - Right/Left: Left Pain - part of body: Shoulder                Time: 1016-1101 OT Time Calculation (min): 45 min Charges:  OT General Charges $OT Visit: 1 Visit OT Evaluation $OT Eval High Complexity: 1 High OT Treatments $Therapeutic Exercise: 38-52 mins  [employer], OTR/L,CBIS  Supplemental OT - [facility] and [facility]   Nathalia Wismer, Clarene Duke 03/24/2022, 12:12 PM

## 2022-03-24 NOTE — Progress Notes (Signed)
Pt called nurse for cough medication. Pt having strong, non-productive cough.  Robitussin prn given.

## 2022-03-24 NOTE — Progress Notes (Signed)
SUBJECTIVE  Chief complaint Shortness of breath  Summary Megan Keith is a 61 y.o. female with PMHx of HFrEF, afib on eliquis, iron deficiency anemia, hyp[erthyroidism, and breast and cervical cancer who presented to [facility] ED on 03/23/2022 with worsening shortness of breath over the past few days.   Overnight events NAEON, VSS. Given IV lasix 40 mg x1 and made ~2L UOP.   Today's interview Pt reports ongoing shortness of breath and generalized weakness/ fatigue, though she feels somewhat more rested through the night. No chest pain, abdominal pain, or N/V/D.   Review of systems A complete ROS was negative except as per HPI.   History was taken with the assistance of a Spanish interpreter over the phone.   OBJECTIVE  Vitals BP 103/83 (BP Location: Right Arm)   Pulse 96   Temp 98.8 F (37.1 C) (Oral)   Resp 20   Ht '4\' 8"'$  (1.422 m)   Wt 57.8 kg   SpO2 94%   BMI 28.57 kg/m   Physical exam Constitutional: Alert and oriented. No acute distress. Converses and answers questions appropriately. Eyes: Sclera anicteric. EOM grossly intact.  HENT: Normocephalic and atraumatic. MMM. White coating on tongue, no buccal plaques noted.  Neck: JVD to mandible. No cervical lymphadenopathy or thyromegaly. Cardiovascular: RRR. S4 present. 4/6 murmur at LLSB.  Respiratory: Mildly decreased breath sounds at the bases. No crackles or wheezes.  Gastrointestinal: Soft. NT/ND. Normoactive bowel sounds.  Musculoskeletal: Trace BLE edema. +2 pulses in bilateral upper and lower extremities. Skin: Skin is warm, dry, and intact. No rashes or lesions noted on clothed exam. Neurologic: Normal speech and language. No gross focal neurologic deficits are appreciated. Psychiatric: Normal mood and affect. Normal speech and behavior.  Labs, studies, and imaging Labs, studies, and imaging from the last 24 hours per EMR and personally reviewed.   Results for orders placed or performed during  the hospital encounter of 03/23/22 (from the past 24 hour(s))  CBC with Differential     Status: Abnormal   Collection Time: 03/23/22  7:01 PM  Result Value Ref Range   WBC 6.7 4.0 - 10.5 K/uL   RBC 3.90 3.87 - 5.11 MIL/uL   Hemoglobin 10.7 (L) 12.0 - 15.0 g/dL   HCT 33.0 (L) 36.0 - 46.0 %   MCV 84.6 80.0 - 100.0 fL   MCH 27.4 26.0 - 34.0 pg   MCHC 32.4 30.0 - 36.0 g/dL   RDW 18.9 (H) 11.5 - 15.5 %   Platelets 340 150 - 400 K/uL   nRBC 0.0 0.0 - 0.2 %   Neutrophils Relative % 83 %   Neutro Abs 5.5 1.7 - 7.7 K/uL   Lymphocytes Relative 9 %   Lymphs Abs 0.6 (L) 0.7 - 4.0 K/uL   Monocytes Relative 8 %   Monocytes Absolute 0.6 0.1 - 1.0 K/uL   Eosinophils Relative 0 %   Eosinophils Absolute 0.0 0.0 - 0.5 K/uL   Basophils Relative 0 %   Basophils Absolute 0.0 0.0 - 0.1 K/uL   Immature Granulocytes 0 %   Abs Immature Granulocytes 0.02 0.00 - 0.07 K/uL  Comprehensive metabolic panel     Status: Abnormal   Collection Time: 03/23/22  7:01 PM  Result Value Ref Range   Sodium 136 135 - 145 mmol/L   Potassium 3.3 (L) 3.5 - 5.1 mmol/L   Chloride 104 98 - 111 mmol/L   CO2 22 22 - 32 mmol/L   Glucose, Bld 117 (H) 70 -  99 mg/dL   BUN 10 8 - 23 mg/dL   Creatinine, Ser 1.15 (H) 0.44 - 1.00 mg/dL   Calcium 9.0 8.9 - 10.3 mg/dL   Total Protein 6.2 (L) 6.5 - 8.1 g/dL   Albumin 3.6 3.5 - 5.0 g/dL   AST 29 15 - 41 U/L   ALT 20 0 - 44 U/L   Alkaline Phosphatase 137 (H) 38 - 126 U/L   Total Bilirubin 1.1 0.3 - 1.2 mg/dL   GFR, Estimated 54 (L) >60 mL/min   Anion gap 10 5 - 15  Troponin I (High Sensitivity)     Status: None   Collection Time: 03/23/22  7:01 PM  Result Value Ref Range   Troponin I (High Sensitivity) 9 <18 ng/L  Brain natriuretic peptide     Status: Abnormal   Collection Time: 03/23/22  7:01 PM  Result Value Ref Range   B Natriuretic Peptide 2,074.9 (H) 0.0 - 100.0 pg/mL  SARS Coronavirus 2 by RT PCR (hospital order, performed in [place] hospital lab) *cepheid single  result test* Anterior Nasal Swab     Status: None   Collection Time: 03/23/22  7:05 PM   Specimen: Anterior Nasal Swab  Result Value Ref Range   SARS Coronavirus 2 by RT PCR NEGATIVE NEGATIVE  Type and screen     Status: None   Collection Time: 03/23/22  7:45 PM  Result Value Ref Range   ABO/RH(D) O POS    Antibody Screen NEG    Sample Expiration      03/26/2022,2359 Performed at [facility], [place] [address] [place], [place] [address]   I-stat chem 8, ED (not at [facility] or [facility])     Status: Abnormal   Collection Time: 03/23/22  7:48 PM  Result Value Ref Range   Sodium 137 135 - 145 mmol/L   Potassium 3.3 (L) 3.5 - 5.1 mmol/L   Chloride 102 98 - 111 mmol/L   BUN 13 8 - 23 mg/dL   Creatinine, Ser 1.00 0.44 - 1.00 mg/dL   Glucose, Bld 115 (H) 70 - 99 mg/dL   Calcium, Ion 1.03 (L) 1.15 - 1.40 mmol/L   TCO2 23 22 - 32 mmol/L   Hemoglobin 12.6 12.0 - 15.0 g/dL   HCT 37.0 36.0 - 46.0 %  Troponin I (High Sensitivity)     Status: None   Collection Time: 03/23/22 10:48 PM  Result Value Ref Range   Troponin I (High Sensitivity) 9 <18 ng/L  Basic metabolic panel     Status: Abnormal   Collection Time: 03/24/22  2:38 AM  Result Value Ref Range   Sodium 138 135 - 145 mmol/L   Potassium 3.0 (L) 3.5 - 5.1 mmol/L   Chloride 106 98 - 111 mmol/L   CO2 23 22 - 32 mmol/L   Glucose, Bld 98 70 - 99 mg/dL   BUN 13 8 - 23 mg/dL   Creatinine, Ser 0.94 0.44 - 1.00 mg/dL   Calcium 8.7 (L) 8.9 - 10.3 mg/dL   GFR, Estimated >60 >60 mL/min   Anion gap 9 5 - 15  Magnesium     Status: None   Collection Time: 03/24/22  2:38 AM  Result Value Ref Range   Magnesium 2.1 1.7 - 2.4 mg/dL  Phosphorus     Status: None   Collection Time: 03/24/22  2:38 AM  Result Value Ref Range   Phosphorus 3.7 2.5 - 4.6 mg/dL   ASSESSMENT & PLAN  Megan Benjume  Norma Keith is a 61 y.o. 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   Hospital day: 1  Principal Problem:   Acute exacerbation of CHF (congestive heart failure) (HCC)  Acute on chronic HFfEF exacerbation Followed by the [facility]. NYHA II-early III. CMRI on 12/21/2021 showed severe LV dilation, LVEF 17%, mod RV dysfunction, RVEF 30, mid wall LGE in LV lateral wall suggestive of prior myocarditis, mod MR, and mod-severe TR. As per [facility] note from 01/25/2022, pt has not been a candidate for advanced therapies given her immigration status and she is unable to be on beta blockers, ARB/ARNI, farxiga, entresto, or digoxin. She was on coreg, lasix, and spironolactone prior to this admission. Pt was felt to be cold and wet on initial evaluation for which she was diuresed with IV lasix 40 mg x1. On reassessment, she is now warm and edema has improved. Suspect that JVD is due to her TR. Will hold further diuresis at this time.  - Repeat TTE - Consider cards consult to reach out to [facility] - Holding lasix and spironolactone  - Telemetry  - Strict I/O's and daily weights - Daily BMP  Paroxysmal afib  Pt in NSR this admission with occasional tachycardia, rare PVCs - Continue home amiodarone 200 daily - Continue home mexiletine 200 BID - Continue home eliquis 5 mg BID - Telemetry  Anxiety and insomnia In the setting of cancer diagnoses and ongoing health issues. Started melatonin O/N but without improvement in sleep.  - Start remeron 7.5 mg nightly - Continue melatonin - Consider initiating SSRI as outpt  Hypokalemia K+ 3.3 on admission, received KCl 40 mEq.  - Continue to monitor lytes  Oral thrush Pt was started on nystatin oral swish at time of GI follow up on 7/28. Continues to have thrush on exam today. Likely in s/o recent abx for possible diverticulitis.  - Continue nystatin 5 cc oral swish for total of 2 weeks (ends 8/11)  Iron deficiency anemia - Continue home ferrous sulfate  325 daily  H/o cervical cancer s/p hysterectomy H/o breast cancer s/p lumpectomy and radiation - Continue home anastrozole  Checklist Diet: Heart healthy IVF: N/A DVT PPx: Eliquis 5 mg BID Electrolytes: K+ repleted today Code status: Full Dispo: Continue admission on floor  ------------------------  Lunette Stands, MS4 03/24/2022 Pager: [phone]

## 2022-03-24 NOTE — Progress Notes (Signed)
Robitussin given to pt for her cough.  Pt asked nurse for nausea medication because the cough makes her feel nauseated.  No prn nausea meds orders. Amion chat MD to make aware.

## 2022-03-24 NOTE — Progress Notes (Addendum)
Pt stated having dysuria, 50cc output again. Urine is dark orange and cloudy, sediment and also blood appeared to be present.  Urine sample collected.  MD notified. Awaiting orders at this time.

## 2022-03-24 NOTE — Patient Instructions (Signed)
Occupational therapy Exercises- Left shoulder  - Standing Bent Over Single Arm Scapular Row with Table Support  - 1 x daily - 7 x weekly - 1 sets - 10-12 reps - Seated Scapular Retraction  - 1 x daily - 7 x weekly - 1 sets - 10 reps - Standing Shoulder Flexion Stretch on Wall  - 1 x daily - 7 x weekly - 1 sets - 10-12 reps - Heat  - as needed daily - Standing Backward Shoulder Rolls  - 1 x daily - 7 x weekly - 1 sets - 10-15 reps

## 2022-03-24 NOTE — Evaluation (Signed)
Physical Therapy Evaluation Patient Details Name: Megan Keith MRN: [id number] DOB: [birth year] Today's Date: 03/24/2022  History of Present Illness  61 y.o. female history of A-fib on Eliquis, HFrEF with EF 20%, hyperthyroidism, admitted 8/1 with HFrEF exacerbation, Nonischemic cardiomyopathy, Paroxysmal A-fib with PVCs, and hypokalemia.  Clinical Impression  Pt admitted with above diagnosis. Initially hypotensive at 84/68 HR in 90s supine in bed, feeling dizzy. Once sitting EOB, BP improved to 103/82; SpO2 100% on RA throughout session. Reduced dizziness reported as evaluation continued. Amb in hall >200 ft with SPC no loss of balance noted. End of session pt BP 104/85. Noted to be coughing frequently in the room but stopped as evaluation progressed. Pt complains of feeling tired. Pt currently with functional limitations due to the deficits listed below (see PT Problem List). Pt will benefit from skilled PT to increase their independence and safety with mobility to allow discharge to the venue listed below.          Recommendations for follow up therapy are one component of a multi-disciplinary discharge planning process, led by the attending physician.  Recommendations may be updated based on patient status, additional functional criteria and insurance authorization.  Follow Up Recommendations No PT follow up      Assistance Recommended at Discharge PRN  Patient can return home with the following  Assist for transportation;Help with stairs or ramp for entrance    Equipment Recommendations None recommended by PT  Recommendations for Other Services       Functional Status Assessment Patient has had a recent decline in their functional status and demonstrates the ability to make significant improvements in function in a reasonable and predictable amount of time.     Precautions / Restrictions Precautions Precautions: None Precaution Comments: monitor BP Restrictions Weight  Bearing Restrictions: No      Mobility  Bed Mobility Overal bed mobility: Modified Independent             General bed mobility comments: extra time    Transfers Overall transfer level: Modified independent Equipment used: Straight cane               General transfer comment: No overt instability noted.    Ambulation/Gait Ambulation/Gait assistance: Supervision Gait Distance (Feet): 220 Feet Assistive device: Straight cane Gait Pattern/deviations: Step-through pattern Gait velocity: decreased Gait velocity interpretation: <1.8 ft/sec, indicate of risk for recurrent falls   General Gait Details: Slightly decreased gait speed, declines having SPC adjusted to appropriate height, uses abnormally high. Steady with this device. Required 2 short standing rest breaks to complete distance. No LOB during bout.  Stairs            Wheelchair Mobility    Modified Rankin (Stroke Patients Only)       Balance Overall balance assessment: Mild deficits observed, not formally tested                                           Pertinent Vitals/Pain Pain Assessment Pain Assessment: No/denies pain (Reports tired and dizzy)    Home Living Family/patient expects to be discharged to:: Private residence Living Arrangements: Children Available Help at Discharge: Family;Friend(s);Available 24 hours/day Type of Home: Mobile home Home Access: Stairs to enter Entrance Stairs-Rails: None Entrance Stairs-Number of Steps: 3   Home Layout: One level Home Equipment: Cane - single point;Hand held shower head  Prior Function Prior Level of Function : Independent/Modified Independent             Mobility Comments: Reports Mod I with SPC       Hand Dominance   Dominant Hand: Right    Extremity/Trunk Assessment   Upper Extremity Assessment Upper Extremity Assessment: Defer to OT evaluation    Lower Extremity Assessment Lower Extremity  Assessment: Generalized weakness       Communication   Communication: Prefers language other than [place];Interpreter utilized Megan Keith [phone])  Cognition Arousal/Alertness: Awake/alert Behavior During Therapy: WFL for tasks assessed/performed Overall Cognitive Status: Within Functional Limits for tasks assessed                                          General Comments General comments (skin integrity, edema, etc.): BP at rest 84/68 prior to session supine position with HR in 90s. SpO2 100% on RA. sitting 103/82; after ambulating BP 104/85 with HR 103. Reports only a little dizziness with upright activities. Additionally pt coughing frequently, and spitting into trashcan. SpO2 maintained 100% throughout on RA.    Exercises     Assessment/Plan    PT Assessment Patient needs continued PT services  PT Problem List Decreased strength;Decreased activity tolerance;Decreased mobility       PT Treatment Interventions DME instruction;Gait training;Stair training;Functional mobility training;Therapeutic activities;Therapeutic exercise;Patient/family education    PT Goals (Current goals can be found in the Care Plan section)  Acute Rehab PT Goals Patient Stated Goal: Feel better PT Goal Formulation: With patient Time For Goal Achievement: 03/31/22 Potential to Achieve Goals: Good    Frequency Min 3X/week     Co-evaluation               AM-PAC PT "6 Clicks" Mobility  Outcome Measure Help needed turning from your back to your side while in a flat bed without using bedrails?: None Help needed moving from lying on your back to sitting on the side of a flat bed without using bedrails?: None Help needed moving to and from a bed to a chair (including a wheelchair)?: None Help needed standing up from a chair using your arms (e.g., wheelchair or bedside chair)?: None Help needed to walk in hospital room?: A Little Help needed climbing 3-5 steps with a railing? : A  Little 6 Click Score: 22    End of Session Equipment Utilized During Treatment: Gait belt Activity Tolerance: Patient tolerated treatment well Patient left: in bed;with call bell/phone within reach   PT Visit Diagnosis: Other abnormalities of gait and mobility (R26.89);Muscle weakness (generalized) (M62.81)    Time: 6503-5465 PT Time Calculation (min) (ACUTE ONLY): 20 min   Charges:   PT Evaluation $PT Eval Low Complexity: 1 Low          Candie Mile, PT   Ellouise Newer 03/24/2022, 11:43 AM

## 2022-03-24 NOTE — Progress Notes (Signed)
Messaged by RN that Ms. [name] made a comment about wanting to take all of her home medications that were in her room. Suicide precautions placed and presented to bedside.   Patient resident in bed restlessly. She says she is nervous and anxious whenever she is in the hospital, but does not feel this way at home. She notes multiple reasons for being anxious including her history of heart failure, chronic abdominal pain, and dry cough that she states is not getting better.    When asked if she has any thoughts of harming herself or wanting to kill herself she says no, she will go when god takes her and she has her grand children to live for. She states she told the nurse she was going to take all of her pills because this is what she wanted to do whatever she could to stop her cough. She states the coughing fit started after taking potassium pills this morning. She denies SI, HI.   RN also concerned about discoloration/sediment of urine and patient mentioning burning with urination and lower abdominal pain. On discussing with the patient she has had abdominal pain for the past few months that is no worse. She describes "little burning with urination" that started after she took the potassium.   On exam she is anxious appearing. Regular rate and rhythm. Saturating well on room, in no respiratory distress. Abdomen has voluntary guarding with palpation across the entire abdomen, soft, non-distended.   Spoke with RN Gunnar Bulla who notes patient told her she wanted to take all of the pills to go to heaven. Will continue suicide precautions and if continued statements of SI will consider psychiatric evaluation. In regards to her urine discoloration and sedimentation, while non-specific, she did endorse symptoms of dysuria. Will order UA/UC.

## 2022-03-25 DIAGNOSIS — I5033 Acute on chronic diastolic (congestive) heart failure: Secondary | ICD-10-CM

## 2022-03-25 DIAGNOSIS — N179 Acute kidney failure, unspecified: Secondary | ICD-10-CM

## 2022-03-25 DIAGNOSIS — F419 Anxiety disorder, unspecified: Secondary | ICD-10-CM

## 2022-03-25 DIAGNOSIS — E875 Hyperkalemia: Secondary | ICD-10-CM

## 2022-03-25 LAB — BASIC METABOLIC PANEL
Anion gap: 18 — ABNORMAL HIGH (ref 5–15)
Anion gap: 14 (ref 5–15)
Anion gap: 15 (ref 5–15)
BUN: 20 mg/dL (ref 8–23)
BUN: 24 mg/dL — ABNORMAL HIGH (ref 8–23)
BUN: 24 mg/dL — ABNORMAL HIGH (ref 8–23)
CO2: 12 mmol/L — ABNORMAL LOW (ref 22–32)
CO2: 18 mmol/L — ABNORMAL LOW (ref 22–32)
CO2: 18 mmol/L — ABNORMAL LOW (ref 22–32)
Calcium: 9.8 mg/dL (ref 8.9–10.3)
Calcium: 9.5 mg/dL (ref 8.9–10.3)
Calcium: 9.5 mg/dL (ref 8.9–10.3)
Chloride: 106 mmol/L (ref 98–111)
Chloride: 105 mmol/L (ref 98–111)
Chloride: 102 mmol/L (ref 98–111)
Creatinine, Ser: 1.26 mg/dL — ABNORMAL HIGH (ref 0.44–1.00)
Creatinine, Ser: 1.26 mg/dL — ABNORMAL HIGH (ref 0.44–1.00)
Creatinine, Ser: 1.16 mg/dL — ABNORMAL HIGH (ref 0.44–1.00)
GFR, Estimated: 49 mL/min — ABNORMAL LOW (ref >60)
GFR, Estimated: 49 mL/min — ABNORMAL LOW (ref >60)
GFR, Estimated: 54 mL/min — ABNORMAL LOW (ref >60)
Glucose, Bld: 108 mg/dL — ABNORMAL HIGH (ref 70–99)
Glucose, Bld: 192 mg/dL — ABNORMAL HIGH (ref 70–99)
Glucose, Bld: 112 mg/dL — ABNORMAL HIGH (ref 70–99)
Potassium: 5.7 mmol/L — ABNORMAL HIGH (ref 3.5–5.1)
Potassium: 4.2 mmol/L (ref 3.5–5.1)
Potassium: 4.3 mmol/L (ref 3.5–5.1)
Sodium: 136 mmol/L (ref 135–145)
Sodium: 137 mmol/L (ref 135–145)
Sodium: 135 mmol/L (ref 135–145)

## 2022-03-25 LAB — CBC
HCT: 37.9 % (ref 36.0–46.0)
Hemoglobin: 11.7 g/dL — ABNORMAL LOW (ref 12.0–15.0)
MCH: 27.3 pg (ref 26.0–34.0)
MCHC: 30.9 g/dL (ref 30.0–36.0)
MCV: 88.6 fL (ref 80.0–100.0)
Platelets: 318 10*3/uL (ref 150–400)
RBC: 4.28 MIL/uL (ref 3.87–5.11)
RDW: 19.2 % — ABNORMAL HIGH (ref 11.5–15.5)
WBC: 8.8 10*3/uL (ref 4.0–10.5)
nRBC: 0.2 % (ref 0.0–0.2)

## 2022-03-25 LAB — URINE CULTURE: Culture: 40000 — AB

## 2022-03-25 MED ORDER — BUSPIRONE HCL 5 MG PO TABS
5.0000 mg | ORAL_TABLET | Freq: Two times a day (BID) | ORAL | Status: DC
  Administered 2022-03-25 – 2022-03-26 (×2): 5 mg via ORAL
  Filled 2022-03-25 (×2): qty 1

## 2022-03-25 MED ORDER — DEXTROSE 50 % IV SOLN
1.0000 | Freq: Once | INTRAVENOUS | Status: AC
  Administered 2022-03-25: 50 mL via INTRAVENOUS
  Filled 2022-03-25: qty 50

## 2022-03-25 MED ORDER — ALBUTEROL SULFATE (2.5 MG/3ML) 0.083% IN NEBU
10.0000 mg | INHALATION_SOLUTION | Freq: Once | RESPIRATORY_TRACT | Status: AC
  Administered 2022-03-25: 10 mg via RESPIRATORY_TRACT
  Filled 2022-03-25: qty 12

## 2022-03-25 MED ORDER — INSULIN ASPART 100 UNIT/ML IV SOLN
10.0000 [IU] | Freq: Once | INTRAVENOUS | Status: AC
  Administered 2022-03-25: 10 [IU] via INTRAVENOUS

## 2022-03-25 MED ORDER — SODIUM ZIRCONIUM CYCLOSILICATE 10 G PO PACK
10.0000 g | PACK | Freq: Once | ORAL | Status: AC
Start: 2022-03-25 — End: 2022-03-25
  Administered 2022-03-25: 10 g via ORAL
  Filled 2022-03-25: qty 1

## 2022-03-25 MED ORDER — CALCIUM GLUCONATE 10 % IV SOLN: 1.0000 g | Freq: Once | INTRAVENOUS | Status: DC

## 2022-03-25 MED ORDER — SODIUM BICARBONATE 8.4 % IV SOLN
50.0000 meq | Freq: Once | INTRAVENOUS | Status: AC
  Administered 2022-03-25: 50 meq via INTRAVENOUS
  Filled 2022-03-25: qty 50

## 2022-03-25 MED ORDER — HYDROXYZINE HCL 10 MG PO TABS
10.0000 mg | ORAL_TABLET | Freq: Three times a day (TID) | ORAL | Status: DC | PRN
Start: 2022-03-25 — End: 2022-03-26
  Administered 2022-03-26: 10 mg via ORAL
  Filled 2022-03-25: qty 1

## 2022-03-25 MED ORDER — CALCIUM GLUCONATE-NACL 1-0.675 GM/50ML-% IV SOLN
1.0000 g | Freq: Once | INTRAVENOUS | Status: AC
  Administered 2022-03-25: 1000 mg via INTRAVENOUS
  Filled 2022-03-25: qty 50

## 2022-03-25 NOTE — Progress Notes (Signed)
   03/25/22 1130  Clinical Encounter Type  Visited With Patient (STRATUSDonnie Aho #[id number])  Visit Type Initial;Spiritual support  Referral From Nurse (Velia G. Gunnar Bulla, RN)  Consult/Referral To Chaplain Albertina Parr Morene Crocker)  Spiritual Encounters  Spiritual Needs Emotional;Grief support  Stress Factors  Patient Stress Factors Exhausted;Health changes   Spiritual Care Consultation: "Suicidal Thoughts." Met Megan Keith at patient's bedside. Use of [employer] Coach: [id number].  Chaplain asked open ended questions inviting reflective dialog pertaining to patient's mood, emotions, sadness. Patient shared history of symptoms regarding pain, "stomach pain," lack of appetite, lack of sleep and restlessness. She states she feels like "giving up" because of lack of understanding of her abdominal pain, and for not receiving an accurate diagnosis of its origin thus far. Frustrated with loss of appetite and sleep complications. She denies sadness or thoughts of suicide.  [address] [place], [place] [phone].

## 2022-03-25 NOTE — Progress Notes (Addendum)
SUBJECTIVE  Chief complaint Shortness of breath  Summary Megan Keith is a 61 y.o. female with PMHx of HFrEF, afib on eliquis, iron deficiency anemia, hyperthyroidism, and breast and cervical cancer who presented to [facility] ED on 03/23/2022 with worsening shortness of breath over the past few days.   Overnight events - Nursing staff yesterday evening was concerned about patient expressing thoughts of suicide; see Dr. Caprice Red progress note for details. No SI noted O/N. Sitter in room  - Pt w/ decreased UOP (50 cc x2) and some dysuria yesterday; UA and UCx were ordered as per Dr. Caprice Red note - Pt given PRN robitussin and lozenge O/N due to cough - K+ 5.7 early today, no arrhythmias on tele but EKG showed prolonged PR interval and along the QTc/ QRS compared to prior EKG on 8/1; initiated hyperkalemic emergency treatment as per Dr. Lamont Snowball progress note from this AM - 350 UOP recorded in past 24 hrs  Today's interview Pt reports ongoing generalized abdominal pain which has been unchanged for many weeks, not worse from prior. She also had multiple coughing fits through the night after having received K+ pills yesterday. SOB and generalized weakness are stable. No chest pain, no N/V/D. She notes she is eating and drinking very little in the hospital due to poor appetite. She denies any SI, but she does express having ongoing anxiety and worries related to her illnesses.   Review of systems A complete ROS was negative except as per HPI.   History was taken with the assistance of a Spanish interpreter over the phone.  OBJECTIVE  Vitals BP 94/73 (BP Location: Right Arm)   Pulse 97   Temp 97.6 F (36.4 C) (Oral)   Resp 17   Ht '4\' 8"'$  (1.422 m)   Wt 57.9 kg   SpO2 95%   BMI 28.63 kg/m   Physical exam Constitutional: Alert and oriented. No acute distress. Converses and answers questions appropriately. Eyes: Sclera anicteric. EOM grossly intact.  HENT:  Normocephalic and atraumatic. MMM. White coating on tongue, no buccal plaques noted.  Neck: JVD to mandible. No cervical lymphadenopathy or thyromegaly. Cardiovascular: Tachycardic, regular rhythm. S4 present. 4/6 murmur at LLSB.  Respiratory: CTAB. No crackles or wheezes. Normal work of breathing.  Gastrointestinal: Soft. NT/ND. Normoactive bowel sounds.  Musculoskeletal: Trace BLE edema. +2 pulses in bilateral upper and lower extremities. Skin: Skin is warm, dry, and intact. No rashes or lesions noted on clothed exam. Neurologic: Normal speech and language. No gross focal neurologic deficits are appreciated. Psychiatric: Normal mood and affect. Normal speech and behavior.  Labs, studies, and imaging Labs, studies, and imaging from the last 24 hours per EMR and personally reviewed.   Results for orders placed or performed during the hospital encounter of 03/23/22 (from the past 24 hour(s))  Basic metabolic panel     Status: Abnormal   Collection Time: 03/24/22  2:58 PM  Result Value Ref Range   Sodium 136 135 - 145 mmol/L   Potassium 4.9 3.5 - 5.1 mmol/L   Chloride 104 98 - 111 mmol/L   CO2 20 (L) 22 - 32 mmol/L   Glucose, Bld 157 (H) 70 - 99 mg/dL   BUN 16 8 - 23 mg/dL   Creatinine, Ser 1.08 (H) 0.44 - 1.00 mg/dL   Calcium 8.9 8.9 - 10.3 mg/dL   GFR, Estimated 58 (L) >60 mL/min   Anion gap 12 5 - 15  Urinalysis, Routine w reflex microscopic Urine, Clean Catch  Status: Abnormal   Collection Time: 03/24/22  8:53 PM  Result Value Ref Range   Color, Urine AMBER (A) YELLOW   APPearance TURBID (A) CLEAR   Specific Gravity, Urine 1.025 1.005 - 1.030   pH 5.0 5.0 - 8.0   Glucose, UA NEGATIVE NEGATIVE mg/dL   Hgb urine dipstick NEGATIVE NEGATIVE   Bilirubin Urine NEGATIVE NEGATIVE   Ketones, ur NEGATIVE NEGATIVE mg/dL   Protein, ur 100 (A) NEGATIVE mg/dL   Nitrite NEGATIVE NEGATIVE   Leukocytes,Ua NEGATIVE NEGATIVE   WBC, UA 0-5 0 - 5 WBC/hpf   Bacteria, UA RARE (A) NONE SEEN    Mucus PRESENT    Amorphous Crystal PRESENT   CBC     Status: Abnormal   Collection Time: 03/25/22  3:32 AM  Result Value Ref Range   WBC 8.8 4.0 - 10.5 K/uL   RBC 4.28 3.87 - 5.11 MIL/uL   Hemoglobin 11.7 (L) 12.0 - 15.0 g/dL   HCT 37.9 36.0 - 46.0 %   MCV 88.6 80.0 - 100.0 fL   MCH 27.3 26.0 - 34.0 pg   MCHC 30.9 30.0 - 36.0 g/dL   RDW 19.2 (H) 11.5 - 15.5 %   Platelets 318 150 - 400 K/uL   nRBC 0.2 0.0 - 0.2 %  Basic metabolic panel     Status: Abnormal   Collection Time: 03/25/22  3:32 AM  Result Value Ref Range   Sodium 136 135 - 145 mmol/L   Potassium 5.7 (H) 3.5 - 5.1 mmol/L   Chloride 106 98 - 111 mmol/L   CO2 12 (L) 22 - 32 mmol/L   Glucose, Bld 108 (H) 70 - 99 mg/dL   BUN 20 8 - 23 mg/dL   Creatinine, Ser 1.26 (H) 0.44 - 1.00 mg/dL   Calcium 9.8 8.9 - 10.3 mg/dL   GFR, Estimated 49 (L) >60 mL/min   Anion gap 18 (H) 5 - 15   Echo complete wo imaging enhancing agent, 03/24/22 1. Left ventricular ejection fraction, by estimation, is <20%. The left ventricle has severely decreased function. The left ventricle demonstrates global hypokinesis. The left ventricular internal cavity size was mildly dilated. Left ventricular diastolic parameters are consistent with Grade II diastolic dysfunction (pseudonormalization). 2. Right ventricular systolic function is mildly reduced. The right ventricular size is normal. There is severely elevated pulmonary artery systolic pressure. The estimated right ventricular systolic pressure is 27.0 mmHg. 3. Left atrial size was severely dilated. 4. Right atrial size was severely dilated. 5. The mitral valve is abnormal. Moderate mitral valve regurgitation, suspect functional MR with LV and LA dilation. No evidence of mitral stenosis. 6. The tricuspid valve is abnormal. Tricuspid valve regurgitation is severe. 7. The aortic valve is tricuspid. Aortic valve regurgitation is trivial. No aortic stenosis is present. 8. The inferior vena cava is dilated in size  with <50% respiratory variability, suggesting right atrial pressure of 15 mmHg. 9. A small pericardial effusion is present.   ASSESSMENT & PLAN  Megan Keith is a 61 y.o. female with PMHx of HFrEF, afib on eliquis, iron deficiency anemia, hyp[erthyroidism, and breast and cervical cancer who is admitted with dyspnea consistent with acute on chronic HFrEF exacerbation.   Hospital day: 2  Principal Problem:   Acute exacerbation of CHF (congestive heart failure) (HCC)  Hyperkalemia Pt was initially hypokalemic on admission with K+ 3.0 and she was given KCl 40 mEq x3. Next K+ at ~3 PM yesterday was 4.9 and at ~3:30 AM today was 5.7.  Telemetry O/N showed no arrhythmias but EKG this AM showed prolonged PR interval of 226 and along the QTc/ QRS compared to prior EKG on 8/1. Given her severely reduced EF, pt is at high risk for arrhythmia so hyperkalemic emergency treatment was initiated. Pt is stable on exam, no acute distress, no changes in mentation or volume status. Repeat BMP at ~10 AM with K+ of 4.2. Will closely monitor today.  - S/p calcium gluconate, sodium bicarb, insulin/dextrose and albuterol - S/p lokelma 10 g - Telemetry  Acute on chronic HFfEF exacerbation [facility]. NYHA II-early III. Pt was felt to be cold and wet at time of admission for which she was diuresed with IV lasix 40 mg x1. She has been warm on exam since then with only trace edema. Per repeat echo on 8/3, her severe disease remains generally unchanged from prior. Will hold further diuresis at this time. - Plan for close outpt follow up with AHF  - Holding lasix and spironolactone  - Continue PRN robitussin and lozenge for cough - Telemetry  - Strict I/O's and daily weights - Daily BMP - PT/OT  AKI Cr this AM was 1.26, up from 0.94 yesterday and 1.15 on admission. Pt reports poor appetite and little PO intake over the past several days. UA last night was turbid and showed  proteinuria but was otherwise unremarkable. AKI is likely due to poor PO intake. Will encourage pt to eat and drink more before considering IVF. - Continue to monitor - Holding diuresis as above  Paroxysmal afib  Pt in NSR this admission with occasional tachycardia, rare PVCs - Continue home amiodarone 200 daily - Continue home mexiletine 200 BID - Continue home eliquis 5 mg BID - Telemetry   Anxiety and insomnia In the setting of cancer diagnoses and ongoing health issues. Started melatonin and remeron but without much improvement in sleep.  - Start buspar 5 mg BID - Start PRN hydroxyzine 10 mg  - Continue nightly remeron and melatonin - Consider adding low-dose klonopin if pt's anxiety does not improve - Consider initiating SSRI as outpt   Oral thrush Pt was started on nystatin oral swish at time of GI follow up on 7/28. Likely in s/o recent abx for possible diverticulitis.  - Continue nystatin 5 cc oral swish for total of 2 weeks (ends 8/11)   Iron deficiency anemia - Continue home ferrous sulfate 325 daily   H/o cervical cancer s/p hysterectomy H/o breast cancer s/p lumpectomy and radiation - Continue home anastrozole   Checklist Diet: Heart healthy IVF: N/A DVT PPx: Eliquis 5 mg BID Code status: Full Dispo: Continue admission on floor  ------------------------  Lunette Stands, MS4 03/25/2022 Pager: [phone]

## 2022-03-25 NOTE — Progress Notes (Signed)
Evaluated patient at bedside.  She appears comfortable in no acute distress.  Patient was lying flat on the bed without respiratory distress.  She only has trace edema without lungs crackles.  She does have JVD up to mandible angle.  All extremities are warm to touch and well-perfused.  Overall I do not think she has signs of low output heart failure.  Her AKI is likely secondary to poor p.o. intake which I encouraged patient to eat more.  Probably will hold off on IV Lasix today.  Noted potassium 5.7 this morning.  EKG showed prolonged PR interval 226 and along the Qtc/ QRS compared to prior EKG on 8/1.  She is at high risk for arrhythmia with severely reduced EF.  Initiate hyperkalemic emergency treatment with calcium gluconate, sodium bicarb, insulin/dextrose and albuterol.  Lokelma 10 g given.  Stat BMP now.

## 2022-03-25 NOTE — TOC Initial Note (Addendum)
Transition of Care [name]) - Initial/Assessment Note    Patient Details  Name: Megan Keith MRN: [id number] Date of Birth: 1960-12-18  [facility] CM/SW Contact:    Zenon Mayo, RN Phone Number: 03/25/2022, 3:17 PM  Clinical Narrative:                 NCM spoke with patient at bedside with video interpreter, Patient is from home, her grand daughter lives with her.  Patient uses a cane at home. Her son or her grand daughter will transport her home at dc.  She does not have any insurance.  NCM will assist patient with [facility] at dc for medications.  She is on 24 hr sucide hold, k is 5.7, abnormal EKG, on hyperkalemia protocol. She has follow up on AVS with [facility], used to be [facility].  TOC following.   Expected Discharge Plan: Home/Self Care Barriers to Discharge: Continued Medical Work up   Patient Goals and CMS Choice Patient states their goals for this hospitalization and ongoing recovery are:: return home   Choice offered to / list presented to : NA  Expected Discharge Plan and Services Expected Discharge Plan: Home/Self Care In-house Referral: NA Discharge Planning Services: CM Consult Post Acute Care Choice: NA Living arrangements for the past 2 months: Single Family Home                   DME Agency: NA       HH Arranged: NA          Prior Living Arrangements/Services Living arrangements for the past 2 months: Single Family Home Lives with:: Adult Children Patient language and need for interpreter reviewed:: Yes Do you feel safe going back to the place where you live?: Yes      Need for Family Participation in Patient Care: Yes (Comment) Care giver support system in place?: Yes (comment) Current home services:  (cane) Criminal Activity/Legal Involvement Pertinent to Current Situation/Hospitalization: No - Comment as needed  Activities of Daily Living      Permission Sought/Granted                  Emotional  Assessment Appearance:: Appears stated age Attitude/Demeanor/Rapport: Engaged Affect (typically observed): Appropriate Orientation: : Oriented to Self, Oriented to Place, Oriented to  Time, Oriented to Situation Alcohol / Substance Use: Not Applicable Psych Involvement: No (comment)  Admission diagnosis:  Acute exacerbation of CHF (congestive heart failure) (HCC) [I50.9] Acute on chronic congestive heart failure, unspecified heart failure type ([place]) [I50.9] Patient Active Problem List   Diagnosis Date Noted   Acute exacerbation of CHF (congestive heart failure) ([place]) 03/23/2022   Acute diverticulitis 03/04/2022   Hematochezia 03/04/2022   Melena 03/04/2022   Pulmonary HTN ([place]) 01/10/2022   CAP (community acquired pneumonia) 01/09/2022   Severe sepsis ([place]) 01/09/2022   Depression 12/24/2021   AKI (acute kidney injury) ([place]) 12/23/2021   Hyperthyroidism 12/23/2021   Cervical cancer ([place]) 12/23/2021   Acute on chronic systolic CHF (congestive heart failure) ([place]) 12/18/2021   Vaginal itching 08/03/2021   Osteoporosis 08/07/2020   Paroxysmal atrial fibrillation ([place]) 08/06/2019   COVID-19 06/06/2019   Chronic combined systolic and diastolic CHF (congestive heart failure) (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[place]) 04/15/2017   PCP:  Earna Coder, NP Pharmacy:   [place], [place] - [address] [address] Phone: [phone] Fax: [phone]  [id number] [id number]: [phone]: [phone]    Social Determinants of Health ([place] Interventions    Readmission Risk Interventions    03/25/2022    3:15 PM 07/10/2019    2:17 PM  Readmission Risk Prevention Plan  Transportation Screening Complete Complete  PCP or Specialist Appt within 5-7 Days  Complete  Home Care Screening  Complete  Medication Review (RN CM)  Complete  Medication Review (RLeon Complete   PCP or Specialist appointment within 3-5 days of discharge Complete   HRI or HPe EllComplete   SW Recovery Care/Counseling Consult Complete   PNiotazeNot Applicable

## 2022-03-26 DIAGNOSIS — G4700 Insomnia, unspecified: Secondary | ICD-10-CM

## 2022-03-26 DIAGNOSIS — D72829 Elevated white blood cell count, unspecified: Secondary | ICD-10-CM

## 2022-03-26 LAB — BASIC METABOLIC PANEL
Anion gap: 12 (ref 5–15)
BUN: 26 mg/dL — ABNORMAL HIGH (ref 8–23)
CO2: 17 mmol/L — ABNORMAL LOW (ref 22–32)
Calcium: 9.4 mg/dL (ref 8.9–10.3)
Chloride: 102 mmol/L (ref 98–111)
Creatinine, Ser: 1.11 mg/dL — ABNORMAL HIGH (ref 0.44–1.00)
GFR, Estimated: 57 mL/min — ABNORMAL LOW (ref >60)
Glucose, Bld: 121 mg/dL — ABNORMAL HIGH (ref 70–99)
Potassium: 5 mmol/L (ref 3.5–5.1)
Sodium: 131 mmol/L — ABNORMAL LOW (ref 135–145)

## 2022-03-26 LAB — CBC
HCT: 34 % — ABNORMAL LOW (ref 36.0–46.0)
Hemoglobin: 10.5 g/dL — ABNORMAL LOW (ref 12.0–15.0)
MCH: 27.1 pg (ref 26.0–34.0)
MCHC: 30.9 g/dL (ref 30.0–36.0)
MCV: 87.9 fL (ref 80.0–100.0)
Platelets: 283 10*3/uL (ref 150–400)
RBC: 3.87 MIL/uL (ref 3.87–5.11)
RDW: 19.3 % — ABNORMAL HIGH (ref 11.5–15.5)
WBC: 12.9 10*3/uL — ABNORMAL HIGH (ref 4.0–10.5)
nRBC: 0.3 % — ABNORMAL HIGH (ref 0.0–0.2)

## 2022-03-26 LAB — CBC WITH DIFFERENTIAL/PLATELET
Abs Immature Granulocytes: 0.07 10*3/uL (ref 0.00–0.07)
Basophils Absolute: 0 10*3/uL (ref 0.0–0.1)
Basophils Relative: 0 %
Eosinophils Absolute: 0 10*3/uL (ref 0.0–0.5)
Eosinophils Relative: 0 %
HCT: 33.7 % — ABNORMAL LOW (ref 36.0–46.0)
Hemoglobin: 10.5 g/dL — ABNORMAL LOW (ref 12.0–15.0)
Immature Granulocytes: 1 %
Lymphocytes Relative: 3 %
Lymphs Abs: 0.4 10*3/uL — ABNORMAL LOW (ref 0.7–4.0)
MCH: 27.2 pg (ref 26.0–34.0)
MCHC: 31.2 g/dL (ref 30.0–36.0)
MCV: 87.3 fL (ref 80.0–100.0)
Monocytes Absolute: 1.2 10*3/uL — ABNORMAL HIGH (ref 0.1–1.0)
Monocytes Relative: 10 %
Neutro Abs: 10.8 10*3/uL — ABNORMAL HIGH (ref 1.7–7.7)
Neutrophils Relative %: 86 %
Platelets: 288 10*3/uL (ref 150–400)
RBC: 3.86 MIL/uL — ABNORMAL LOW (ref 3.87–5.11)
RDW: 19.5 % — ABNORMAL HIGH (ref 11.5–15.5)
WBC: 12.5 10*3/uL — ABNORMAL HIGH (ref 4.0–10.5)
nRBC: 0.3 % — ABNORMAL HIGH (ref 0.0–0.2)

## 2022-03-26 MED ORDER — ACETAMINOPHEN 325 MG PO TABS: 650.0000 mg | ORAL_TABLET | Freq: Four times a day (QID) | ORAL | 0 refills | Status: DC | PRN

## 2022-03-26 MED ORDER — HYDROXYZINE HCL 10 MG PO TABS: 10.0000 mg | ORAL_TABLET | Freq: Three times a day (TID) | ORAL | 0 refills | Status: AC | PRN

## 2022-03-26 MED ORDER — MIRTAZAPINE 7.5 MG PO TABS: 7.5000 mg | ORAL_TABLET | Freq: Every day | ORAL | 0 refills | Status: AC

## 2022-03-26 MED ORDER — BUSPIRONE HCL 5 MG PO TABS: 5.0000 mg | ORAL_TABLET | Freq: Two times a day (BID) | ORAL | 0 refills | Status: DC

## 2022-03-26 MED ORDER — BUSPIRONE HCL 5 MG PO TABS
5.0000 mg | ORAL_TABLET | Freq: Two times a day (BID) | ORAL | 0 refills | Status: AC
Start: 2022-03-26 — End: ?

## 2022-03-26 MED ORDER — FERROUS SULFATE 325 (65 FE) MG PO TABS: 325.0000 mg | ORAL_TABLET | ORAL | 3 refills | Status: DC

## 2022-03-26 MED ORDER — ACETAMINOPHEN 325 MG PO TABS: 650.0000 mg | ORAL_TABLET | Freq: Four times a day (QID) | ORAL | 0 refills | Status: AC | PRN

## 2022-03-26 MED ORDER — HYDROXYZINE HCL 10 MG PO TABS: 10.0000 mg | ORAL_TABLET | Freq: Three times a day (TID) | ORAL | 0 refills | Status: DC | PRN

## 2022-03-26 MED ORDER — FERROUS SULFATE 325 (65 FE) MG PO TABS: 325.0000 mg | ORAL_TABLET | ORAL | 3 refills | Status: AC

## 2022-03-26 MED ORDER — MELATONIN 5 MG PO TABS: 5.0000 mg | ORAL_TABLET | Freq: Every evening | ORAL | 0 refills | Status: DC | PRN

## 2022-03-26 MED ORDER — MELATONIN 5 MG PO TABS: 5.0000 mg | ORAL_TABLET | Freq: Every evening | ORAL | 0 refills | Status: AC | PRN

## 2022-03-26 MED ORDER — MIRTAZAPINE 7.5 MG PO TABS: 7.5000 mg | ORAL_TABLET | Freq: Every day | ORAL | 0 refills | Status: DC

## 2022-03-26 NOTE — Progress Notes (Signed)
Mobility Specialist Progress Note   03/26/22 1200  Mobility  Activity Ambulated with assistance in hallway  Level of Assistance Contact guard assist, steadying assist  Assistive Device Cane  Distance Ambulated (ft) 200 ft  Activity Response Tolerated fair  $Mobility charge 1 Mobility   Post Mobility: 114 HR, 99/89 BP, 97% SpO2  Received in bed c/o fatigue but agreeable w/ slight encouragement.x2 standing rest breaks d/t increased fatigue and min dyspnea, SpO2 read 89% on RA. Returned back to EOB where pt c/o dizziness and HA, BP(99/89). Left back supine, placed call bell in reach and notified RN.   Holland Falling Mobility Specialist MS [facility] #:  [phone] Acute Rehab Office:  [phone]

## 2022-03-26 NOTE — Progress Notes (Signed)
OT Cancellation Note  Patient Details Name: Sherry Blackard MRN: [id number] DOB: 1960-11-21   Cancelled Treatment:    Reason Eval/Treat Not Completed: Patient declined, no reason specified Video interpreter used: Freida Busman [phone] Pt declined participation in skilled OT tx session. Reports no pain in left shoulder/scapula.    Ailene Ravel, OTR/L,CBIS  Supplemental OT - [facility] and Dirk Dress  03/26/2022, 3:04 PM

## 2022-03-26 NOTE — Discharge Summary (Signed)
Name: Megan Keith MRN: [id number] DOB: 20-Apr-1961 61 y.o. PCP: Earna Coder, NP  Date of Admission: 03/23/2022  6:46 PM Date of Discharge: 03/26/22 Attending Physician: Dr. Daryll Drown  Discharge Diagnosis: Principal Problem:   Acute exacerbation of CHF (congestive heart failure) [facility])   Discharge Medications: Allergies as of 03/26/2022   No Known Allergies      Medication List     STOP taking these medications    spironolactone 25 MG tablet Commonly known as: ALDACTONE       TAKE these medications    acetaminophen 325 MG tablet Commonly known as: TYLENOL Take 2 tablets (650 mg total) by mouth every 6 (six) hours as needed for mild pain (or Fever >/= 101).   amiodarone 200 MG tablet Commonly known as: PACERONE Take 1 tablet (200 mg total) by mouth daily. TAKE 1 Tablet BY MOUTH ONCE EVERY DAY Strength: 200 mg   anastrozole 1 MG tablet Commonly known as: ARIMIDEX Take 1 tablet (1 mg total) by mouth every evening.   apixaban 5 MG Tabs tablet Commonly known as: ELIQUIS Take 1 tablet (5 mg total) by mouth 2 (two) times daily.   busPIRone 5 MG tablet Commonly known as: BUSPAR Take 1 tablet (5 mg total) by mouth 2 (two) times daily.   carvedilol 3.125 MG tablet Commonly known as: COREG Take 3.125 mg by mouth 2 (two) times daily.   ferrous sulfate 325 (65 FE) MG tablet Take 1 tablet (325 mg total) by mouth every other day.   furosemide 20 MG tablet Commonly known as: LASIX Take 3 tablets (60 mg total) by mouth daily.   hydrOXYzine 10 MG tablet Commonly known as: ATARAX Take 1 tablet (10 mg total) by mouth 3 (three) times daily as needed for anxiety.   melatonin 5 MG Tabs Take 1 tablet (5 mg total) by mouth at bedtime as needed.   mexiletine 200 MG capsule Commonly known as: MEXITIL Take 1 capsule (200 mg total) by mouth 2 (two) times daily.   mirtazapine 7.5 MG tablet Commonly known as: REMERON Take 1 tablet (7.5 mg total) by mouth at  bedtime.   nystatin 100000 UNIT/ML suspension Commonly known as: MYCOSTATIN Take 5 mLs (500,000 Units total) by mouth 4 (four) times daily for 14 days. Swish in the mouth and swallow   omeprazole 20 MG capsule Commonly known as: PRILOSEC Take 20 mg by mouth daily.        Disposition and follow-up:   Ms.Megan Keith was discharged from [facility] in Stable condition.  At the hospital follow up visit please address:  1.  Follow-up:  a. Heart Failure - Hypervolemia. Blood Pressure    b. Electrolyte Derangements - Follow up BMP   c. Anxiety/Depression - Buspar adherence and how she is doing   d. Leukocytosis - follow up for signs of infection  2.  Labs / imaging needed at time of follow-up: CBC, BMP  3.  Pending labs/ test needing follow-up: none  4.  Medication Changes  Stopped spironolactone  Started buspar and hydroxyzine PRN  Follow-up Appointments:  Follow-up Information     Earna Coder, NP Follow up on 04/06/2022.   Specialty: Family Medicine Why: already has apt scheduled for 2:30 Contact information: [place] [address] [phone]         Bensimhon, Shaune Pascal, MD .   Specialty: Cardiology Contact information: [address] [place] [address] [phone]  At time of hospital follow-up visit, please address the following:  - Examine patient's volume status on her home HF regimen - Reassess patient's response to new sleep and anxiety medications (buspar, hydroxyzine, melatonin, remeron), consider SSRI - Recheck WBC for leukocytosis - Recheck BMP for potassium imbalance - Examine for oral thrush (course of nystatin oral swish ends on 8/11) - Goals of care discussions, consider involving palliative care  ----------  Megan Keith is a 61 y.o. female with PMHx of HFrEF, afib on eliquis, iron deficiency anemia, hyperthyroidism, and breast and  cervical cancer who was admitted to [facility] on 03/23/2022 with dyspnea consistent with acute on chronic HFrEF exacerbation.   Please see below for problem-based hospital course.   # Acute on chronic HFrEF exacerbation Patient is followed by [facility] and her disease is classified as NYHA II-early III. CMRI on 12/21/2021 showed severe LV dilation, LVEF 17%, moderate RV dysfunction, RVEF 30, mid wall LGE in LV lateral wall suggestive of prior myocarditis, moderate MR, and moderate to severe TR. As per AHF note from 01/25/2022, patient has not been a candidate for advanced therapies given her immigration status, and she is unable to be on beta blockers, ARB/ARNI, farxiga, entresto, or digoxin. She was on coreg, lasix, and spironolactone prior to this admission. Patient was felt to be cold and wet on initial evaluation for which she was diuresed with IV lasix. She subsequently remained warm on exam and with only trace edema. She has significant JVD which we suspect is due to her TR. We held further diuresis during her hospital stay. Repeat TTE showed no significant changes in her severe heart disease and RA pressure was 15. Her blood pressures have been soft in the 56'L systolic. Will continue her home lasix and hold her spironolactone at discharge with plans to reexamine at her PCP follow up. She has also been scheduled for hospital follow up with the AHF team.   # Potassium imbalance Patient's K+ on admission was 3.3 for which she received KCl 40 mEq. Her next K+ at ~3 PM on 8/2 was 4.9 and at ~3:30 AM on 8/3 was 5.7 Telemetry showed no arrhythmias but EKG on the morning of 8/3 showed prolonged PR interval compared to prior EKG on 8/1. Given her severely reduced EF, patient was at high risk for arrhythmia so hyperkalemic emergency treatment was initiated with calcium gluconate, sodium bicarb, insulin/ dextrose, albuterol, and lokelma. K+ subsequently normalized. Patient  remained stable on exam without any acute distress or changes in mentation/ volume status. We will have her recheck labs at her PCP follow up appointment.    # Anxiety and insomnia Patient reports ongoing anxiety and difficulty sleeping at night due to concerns about her declining health. She was not on any anxiolytics or antidepressants before this admission. We started her on nightly melatonin and remeron without much improvement. There were some concerns of patient having suicidal thoughts for which she was briefly placed on precautions, but on reevaluation she denied ever having any SI and instead was just worried about her acute on chronic illnesses. We added buspar BID and hydroxyzine PRN on 8/3 which did help ease her anxiety and insomnia. She will be discharged on these four medications and will follow up with her PCP to reevaluate/ optimize her regimen.   # Leukocytosis Patient's WBC remained normal until 8/4 when it rose from 8.8 to 12.9. Smear showed neutrophil predominance. She had complained of dysuria on the  night of 8/2 but UA was non-inflammatory and UCx showed lactobacillus which was likely contaminant. She had no further dysuria at time of discharge. She also denied any fevers, chills, new pain symptoms, or N/V/D. There was no clear source of possible infection. Patient had no other sources of infection and no symptoms such as fevers, chills, any new pains, or N/V/D. We will have patient recheck labs at PCP follow up.   # Oral thrush Patient was started on nystatin oral swish at the time of GI follow up on 7/28, likely in the setting of having recently received antibiotics. She continued the oral swish this admission and will complete the course as outpatient.   # Paroxysmal afib Patient remained in NSR this admission with occasional tachycardia and rare PVCs. We conitnued her home amiodarone, mexiletine, and eliquis.   # History of cervical cancer s/p hysterectomy # History of  breast cancer s/p lumpectomy and radiation Continued home anastrozole.  # Iron deficiency anemia Continued home ferrous sulfate 325 daily.  # Abdominal pain # Elevated alk phos Patient has ongoing abdominal pain and alk phos elevation for at least the past several months. She was also recently admitted with diverticulitis but at the time of hospital follow up, GI suspected her symptoms were instead due to mild colonic ischemia in the setting of her severe heart failure. Her abdominal pain remained stable during this admission and did not necessitate any additional testing or management.   ----------  Subjective No acute events overnight. Vitals signs remained stable. Patient reports she slept well through the night. Her anxiety, appetite, and SOB have also improved some. Her last BM was yesterday. She denies any more dysuria. No fevers, chest pain, or N/V.   Objective Blood pressure 107/73, pulse (!) 116, temperature 98.7 F (37.1 C), temperature source Oral, resp. rate 18, height 4' 8"  (1.422 m), weight 57.6 kg, SpO2 96 %.  Constitutional: Alert and oriented. No acute distress. Converses and answers questions appropriately. Eyes: Sclera anicteric. EOM grossly intact.  HENT: Normocephalic and atraumatic. MMM. White coating on tongue, no buccal plaques noted.  Neck: JVD to mandible. No cervical lymphadenopathy or thyromegaly. Cardiovascular: Tachycardic, regular rhythm. S4 present. 4/6 murmur at LLSB.  Respiratory: CTAB. No crackles or wheezes. Normal work of breathing.  Gastrointestinal: Soft. NT/ND. Normoactive bowel sounds.  Musculoskeletal: Trace BLE edema. +2 pulses in bilateral upper and lower extremities. Skin: Skin is warm, dry, and intact. No rashes or lesions noted on clothed exam. Neurologic: Normal speech and language. No gross focal neurologic deficits are appreciated. Psychiatric: Normal mood and affect. Normal speech and behavior.  Labs, studies, and imaging from the last  24 hours per EMR and personally reviewed.     Pertinent Labs, Studies, and Procedures:     Latest Ref Rng & Units 03/26/2022    5:16 AM 03/25/2022    3:32 AM 03/23/2022    7:48 PM  CBC  WBC 4.0 - 10.5 K/uL 4.0 - 10.5 K/uL 12.9    12.5  8.8    Hemoglobin 12.0 - 15.0 g/dL 12.0 - 15.0 g/dL 10.5    10.5  11.7  12.6   Hematocrit 36.0 - 46.0 % 36.0 - 46.0 % 34.0    33.7  37.9  37.0   Platelets 150 - 400 K/uL 150 - 400 K/uL 283    288  318         Latest Ref Rng & Units 03/26/2022    5:16 AM 03/25/2022    2:56  PM 03/25/2022    9:56 AM  CMP  Glucose 70 - 99 mg/dL 121  112  192   BUN 8 - 23 mg/dL 26  24  24    Creatinine 0.44 - 1.00 mg/dL 1.11  1.16  1.26   Sodium 135 - 145 mmol/L 131  135  137   Potassium 3.5 - 5.1 mmol/L 5.0  4.3  4.2   Chloride 98 - 111 mmol/L 102  102  105   CO2 22 - 32 mmol/L 17  18  18    Calcium 8.9 - 10.3 mg/dL 9.4  9.5  9.5     ECHOCARDIOGRAM COMPLETE  Result Date: 03/24/2022    ECHOCARDIOGRAM REPORT   Patient Name:   Megan Keith [facility] Date of Exam: 03/24/2022 Medical Rec #:  [id number]               Height:       56.0 in Accession #:    [id number]              Weight:       127.4 lb Date of Birth:  1960-11-05               BSA:          1.466 m Patient Age:    43 years                BP:           105/79 mmHg Patient Gender: F                       HR:           102 bpm. Exam Location:  Inpatient Procedure: 2D Echo, 3D Echo, Color Doppler, Cardiac Doppler and Strain Analysis Indications:    I50.9* Heart failure (unspecified)  History:        Patient has prior history of Echocardiogram examinations, most                 recent 09/23/2021. CHF; Arrythmias:Atrial Fibrillation.  Sonographer:    Raquel Sarna Senior RDCS Referring Phys: [place]  1. Left ventricular ejection fraction, by estimation, is <20%. The left ventricle has severely decreased function. The left ventricle demonstrates global hypokinesis. The left ventricular internal cavity size was  mildly dilated. Left ventricular diastolic parameters are consistent with Grade II diastolic dysfunction (pseudonormalization).  2. Right ventricular systolic function is mildly reduced. The right ventricular size is normal. There is severely elevated pulmonary artery systolic pressure. The estimated right ventricular systolic pressure is 54.5 mmHg.  3. Left atrial size was severely dilated.  4. Right atrial size was severely dilated.  5. The mitral valve is abnormal. Moderate mitral valve regurgitation, suspect functional MR with LV and LA dilation. No evidence of mitral stenosis.  6. The tricuspid valve is abnormal. Tricuspid valve regurgitation is severe.  7. The aortic valve is tricuspid. Aortic valve regurgitation is trivial. No aortic stenosis is present.  8. The inferior vena cava is dilated in size with <50% respiratory variability, suggesting right atrial pressure of 15 mmHg.  9. A small pericardial effusion is present. FINDINGS  Left Ventricle: Left ventricular ejection fraction, by estimation, is <20%. The left ventricle has severely decreased function. The left ventricle demonstrates global hypokinesis. The left ventricular internal cavity size was mildly dilated. There is no  left ventricular hypertrophy. Left ventricular diastolic parameters are consistent with Grade II diastolic dysfunction (pseudonormalization). Right Ventricle: The  right ventricular size is normal. No increase in right ventricular wall thickness. Right ventricular systolic function is mildly reduced. There is severely elevated pulmonary artery systolic pressure. The tricuspid regurgitant velocity is 3.40 m/s, and with an assumed right atrial pressure of 15 mmHg, the estimated right ventricular systolic pressure is 26.3 mmHg. Left Atrium: Left atrial size was severely dilated. Right Atrium: Right atrial size was severely dilated. Pericardium: A small pericardial effusion is present. Mitral Valve: The mitral valve is abnormal.  Moderate mitral valve regurgitation. No evidence of mitral valve stenosis. Tricuspid Valve: The tricuspid valve is abnormal. Tricuspid valve regurgitation is severe. Aortic Valve: The aortic valve is tricuspid. Aortic valve regurgitation is trivial. No aortic stenosis is present. Pulmonic Valve: The pulmonic valve was normal in structure. Pulmonic valve regurgitation is trivial. Aorta: The aortic root is normal in size and structure. Venous: The inferior vena cava is dilated in size with less than 50% respiratory variability, suggesting right atrial pressure of 15 mmHg. IAS/Shunts: No atrial level shunt detected by color flow Doppler.  LEFT VENTRICLE PLAX 2D LVIDd:         5.40 cm   Diastology LVIDs:         5.10 cm   LV e' medial:    6.96 cm/s LV PW:         1.00 cm   LV E/e' medial:  12.8 LV IVS:        0.60 cm   LV e' lateral:   20.50 cm/s LVOT diam:     2.00 cm   LV E/e' lateral: 4.3 LV SV:         20 LV SV Index:   14 LVOT Area:     3.14 cm                           3D Volume EF:                          3D EF:        19 %                          LV EDV:       164 ml                          LV ESV:       133 ml                          LV SV:        31 ml RIGHT VENTRICLE RV S prime:     7.94 cm/s TAPSE (M-mode): 1.0 cm LEFT ATRIUM              Index        RIGHT ATRIUM           Index LA diam:        5.00 cm  3.41 cm/m   RA Area:     33.30 cm LA Vol (A2C):   92.7 ml  63.25 ml/m  RA Volume:   127.00 ml 86.65 ml/m LA Vol (A4C):   104.0 ml 70.96 ml/m LA Biplane Vol: 98.5 ml  67.20 ml/m  AORTIC VALVE LVOT Vmax:   47.20 cm/s LVOT Vmean:  36.600 cm/s LVOT VTI:    0.065 m  AORTA Ao Root diam: 2.80 cm Ao Asc diam:  2.90 cm MITRAL VALVE                  TRICUSPID VALVE MV Area (PHT): 6.71 cm       TR Peak grad:   46.2 mmHg MV Decel Time: 113 msec       TR Vmax:        340.00 cm/s MR Peak grad:    49.8 mmHg MR Mean grad:    36.0 mmHg    SHUNTS MR Vmax:         353.00 cm/s  Systemic VTI:  0.06 m MR Vmean:         290.0 cm/s   Systemic Diam: 2.00 cm MR PISA:         1.57 cm MR PISA Eff ROA: 14 mm MR PISA Radius:  0.50 cm MV E velocity: 89.00 cm/s Dalton McleanMD Electronically signed by Franki Monte Signature Date/Time: 03/24/2022/7:41:33 PM    Final    DG Chest Port 1 View  Result Date: 03/23/2022 CLINICAL DATA:  Shortness of breath EXAM: PORTABLE CHEST 1 VIEW COMPARISON:  02/18/2022 FINDINGS: Cardiac shadow is enlarged but stable. Aortic calcifications are again seen. The lungs are well aerated bilaterally. No acute bony abnormality is noted. IMPRESSION: Stable cardiomegaly.  No other focal abnormality is noted. Electronically Signed   By: Inez Catalina M.D.   On: 03/23/2022 19:16     Discharge Instructions: Discharge Instructions     Call MD for:  difficulty breathing, headache or visual disturbances   Complete by: As directed    Call MD for:  persistant dizziness or light-headedness   Complete by: As directed    Call MD for:  persistant nausea and vomiting   Complete by: As directed    Call MD for:  redness, tenderness, or signs of infection (pain, swelling, redness, odor or green/yellow discharge around incision site)   Complete by: As directed    Call MD for:  severe uncontrolled pain   Complete by: As directed    Call MD for:  temperature >100.4   Complete by: As directed    Diet - low sodium heart healthy   Complete by: As directed    Increase activity slowly   Complete by: As directed        Signed: Riesa Pope, MD 03/26/2022, 7:36 PM   Pager: [phone]

## 2022-03-26 NOTE — Discharge Instructions (Addendum)
Megan Keith,   You were hospitalized at [facility] for shortness of breath and increase in fluid which was caused by your heart failure. We gave you lasix in the hospital which helped ease your symptoms. We stopped your home water pills because of your difficulty breathing and generalized weakness. We also started medications to help with your anxiety and sleep. You will see your primary provider next week and she will talk to you about continuing or changing these medications.   Please note these changes made to your medications: START taking: - Buspar every morning and every night - Hydroxyzine as needed for increased anxiety, up to three times in one day - Remeron every night - Melatonin at night as needed for sleep  CHANGE taking: - Iron supplement (take every other day instead of daily)  STOP taking: - Lasix - Spironolactone  We have arranged for you to follow up: - Primary provider [place]) - August 11 at 8:30 AM - [facility] - August 28 (you have been placed on a wait list and will be called if an appointment becomes available sooner)  If you experience any of the following symptoms - fevers, chills, chest pain, or severe shortness of breath - please call your primary care doctor to determine if you need to be seen. If your symptoms are very severe or you feel very sick, please visit to the nearest emergency department to be evaluated.  You may call our clinic if you have any questions or concerns; we may be able to help and keep you from a long and expensive emergency department wait. Our clinic and after hours phone number is [phone]. The best time to call is Monday through Friday, 9 AM to 4 PM, but there is always someone available 24/7 if you have an emergency.   If you need medication refills, please notify your pharmacy one week in advance and they will send [place] a request.   Thank you for allowing [place] to be a part of  your care. Do not hesitate to reach out - we are here to help.  Sincerely, [facility] Internal Medicine Teaching Service    ----------------------------------------------------------------------  Fue hospitalizado en [place] a presentar dificultad respiratoria e incremento en la retencin de lquidos, ocasionado por insuficiencia cardaca. Se le administr Lasix durante su estancia hospitalaria, lo cual contribuy a aliviar sus sntomas. Hemos suspendido sus pldoras para el agua en casa debido a su dificultad para respirar y debilidad generalizada. Asimismo, se iniciaron medicamentos para atender su ansiedad y problemas de sueo. Su proveedor principal Scientist, water quality prxima semana y Midwife la continuacin o modificacin de estos medicamentos.  Por favor, tome en cuenta los siguientes cambios en sus medicamentos:  COMIENCE a tomar: - Buspar todas las maanas y noches. - Hydroxyzine segn sea necesario para la ansiedad, hasta tres veces al [employer]. - Remeron todas las noches. - Melatonina por la noche segn sea necesario para dormir.  CAMBIO en la toma: - Suplemento de hierro (tomar [employer] en lugar de diariamente).  DEJE de tomar:  - Espironolactona  Hemos programado las siguientes citas para usted: - Proveedor principal Radiation protection practitioner) - 11 de agosto a las 8:30 AM. - Clnica de Insuficiencia Cardaca [place] - 43 de agosto (usted ha sido incluido en una lista de espera y ser contactado en caso de que haya una cita disponible antes).  En caso de experimentar alguno  de los siguientes sntomas: fiebre, escalofros, dolor en el pecho o dificultad respiratoria severa, le rogamos que se comunique con su mdico de atencin primaria para determinar si es necesario que acuda a una [place]. Si sus sntomas son Orlene Erm graves o se siente muy enfermo, le recomendamos acudir al servicio de emergencias ms cercano para ser evaluado.  Puede ponerse en contacto  con nuestra clnica si tiene alguna pregunta o preocupacin; posiblemente podamos brindarle asistencia y Product/process development scientist que deba esperar mucho tiempo en el servicio de emergencias, lo cual puede ser costoso. Nuestro nmero de telfono de la clnica y el nmero de atencin fuera del horario habitual es [phone]. El mejor momento para comunicarse es de lunes a viernes, de 9 AM a 4 PM, aunque siempre hay alguien disponible las 24 horas del da, los 7 das de la semana en caso de Multimedia programmer.  Si necesita renovar sus recetas mdicas, le solicitamos que notifique a su farmacia con una semana de anticipacin y ellos nos enviarn la solicitud correspondiente.  Agradecemos que nos haya permitido formar parte de su cuidado. No dude en comunicarse; estamos aqu para ayudarle.  Atentamente, [facility] [place] [facility] [place].

## 2022-03-26 NOTE — Progress Notes (Signed)
Physical Therapy Treatment Patient Details Name: Megan Keith MRN: [id number] DOB: [birth year] Today's Date: 03/26/2022   History of Present Illness 61 y.o. female history of A-fib on Eliquis, HFrEF with EF 20%, hyperthyroidism, admitted 8/1 with HFrEF exacerbation, Nonischemic cardiomyopathy, Paroxysmal A-fib with PVCs, and hypokalemia.    PT Comments    Pt was seen for follow up after mobility tech had seen her, and expressed too much fatigue to do a lot of therapy.  Took her to the steps and practiced for home, along with taking a short walk.  Pt is reported via translation to have no feelings related to her HR going up to 119, BP is normal range with no orthostatics.  Follow along with her for gait and stairs as tolerated, and will not need PT to follow up afterward as she is close functionally to baseline.   Recommendations for follow up therapy are one component of a multi-disciplinary discharge planning process, led by the attending physician.  Recommendations may be updated based on patient status, additional functional criteria and insurance authorization.  Follow Up Recommendations  No PT follow up     Assistance Recommended at Discharge PRN  Patient can return home with the following Assistance with cooking/housework;Assist for transportation;Help with stairs or ramp for entrance   Equipment Recommendations  None recommended by PT    Recommendations for Other Services       Precautions / Restrictions Precautions Precautions: None Precaution Comments: monitor BP Restrictions Weight Bearing Restrictions: No     Mobility  Bed Mobility Overal bed mobility: Independent                  Transfers Overall transfer level: Independent Equipment used: None                    Ambulation/Gait Ambulation/Gait assistance: Counsellor (Feet): 15 Feet Assistive device: Straight cane Gait Pattern/deviations: Step-through pattern, Wide base  of support Gait velocity: reduced Gait velocity interpretation: <1.31 ft/sec, indicative of household ambulator   General Gait Details: titrating her effort with cane due to fatigue   Stairs Stairs: Yes Stairs assistance: Min guard Stair Management: Forwards, Backwards, With cane Number of Stairs: 3 General stair comments: pt is comfortable stepping up and down with traslation for instructions   Wheelchair Mobility    Modified Rankin (Stroke Patients Only)       Balance Overall balance assessment: Needs assistance Sitting-balance support: Feet supported Sitting balance-Leahy Scale: Good     Standing balance support: Single extremity supported Standing balance-Leahy Scale: Fair                              Cognition Arousal/Alertness: Awake/alert Behavior During Therapy: WFL for tasks assessed/performed Overall Cognitive Status: Within Functional Limits for tasks assessed                                          Exercises      General Comments General comments (skin integrity, edema, etc.): pt is tired but fairly competent although her HR did climb to 119 during the effort      Pertinent Vitals/Pain Pain Assessment Pain Assessment: No/denies pain    Home Living  Prior Function            PT Goals (current goals can now be found in the care plan section) Acute Rehab PT Goals Patient Stated Goal: Feel better Progress towards PT goals: Progressing toward goals    Frequency    Min 3X/week      PT Plan Current plan remains appropriate    Co-evaluation              AM-PAC PT "6 Clicks" Mobility   Outcome Measure  Help needed turning from your back to your side while in a flat bed without using bedrails?: None Help needed moving from lying on your back to sitting on the side of a flat bed without using bedrails?: None Help needed moving to and from a bed to a chair (including a  wheelchair)?: None Help needed standing up from a chair using your arms (e.g., wheelchair or bedside chair)?: None Help needed to walk in hospital room?: None Help needed climbing 3-5 steps with a railing? : A Little 6 Click Score: 23    End of Session Equipment Utilized During Treatment: Gait belt;Other (comment) (cane) Activity Tolerance: Patient tolerated treatment well Patient left: in bed;with call bell/phone within reach Nurse Communication: Mobility status PT Visit Diagnosis: Other abnormalities of gait and mobility (R26.89);Muscle weakness (generalized) (M62.81)     Time: 5449-2010 PT Time Calculation (min) (ACUTE ONLY): 16 min  Charges:  $Gait Training: 8-22 mins Ramond Dial 03/26/2022, 3:26 PM  Mee Hives, PT PhD Acute Rehab Dept. Number: [place] and [place]

## 2022-03-26 NOTE — TOC Transition Note (Signed)
Transition of Care [facility]) - CM/SW Discharge Note   Patient Details  Name: Keishla Oyer MRN: [id number] Date of Birth: [birth year]  [facility] CM/SW Contact:  Zenon Mayo, RN Phone Number: 03/26/2022, 5:11 PM   Clinical Narrative:    Patient is for dc today, NCM gave her a Match letter to [facility] with medications.  MD changed pharmacy to [facility] in [place] on [address]    Final next level of care: Home/Self Care Barriers to Discharge: Continued Medical Work up   Patient Goals and CMS Choice Patient states their goals for this hospitalization and ongoing recovery are:: return home   Choice offered to / list presented to : NA  Discharge Placement                       Discharge Plan and Services In-house Referral: NA Discharge Planning Services: CM Consult Post Acute Care Choice: NA            DME Agency: NA       HH Arranged: NA          Social Determinants of Health (SDOH) Interventions     Readmission Risk Interventions    03/25/2022    3:15 PM 07/10/2019    2:17 PM  Readmission Risk Prevention Plan  Transportation Screening Complete Complete  PCP or Specialist Appt within 5-7 Days  Complete  Home Care Screening  Complete  Medication Review (RN CM)  Complete  Medication Review (RN Care Manager) Complete   PCP or Specialist appointment within 3-5 days of discharge Complete   HRI or [place] Complete   SW Recovery Care/Counseling Consult Complete   [place] Not Applicable

## 2022-03-27 ENCOUNTER — Telehealth: Payer: Self-pay | Admitting: Student

## 2022-03-28 ENCOUNTER — Other Ambulatory Visit: Payer: Self-pay | Admitting: Student
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
## 2022-04-19 ENCOUNTER — Encounter (HOSPITAL_COMMUNITY): Payer: Self-pay

## 2022-04-23 DEATH — deceased

## 2022-05-06 ENCOUNTER — Encounter (HOSPITAL_COMMUNITY): Payer: Self-pay | Admitting: Internal Medicine

## 2022-05-24 ENCOUNTER — Ambulatory Visit: Payer: Self-pay | Admitting: Oncology

## 2022-06-30 IMAGING — CT CT ABD-PELV W/ CM
2 of 5 series · 16 of 46 positions shown, 18 images · IV contrast (APPLIED)
Comparison: None Available.

CLINICAL DATA: Dyspnea. Tachycardia. High clinical probability for
pulmonary embolism. Abdominal pain.



[Series 7: thins · axial · 0.72mm/px · z∈[+891,+1294]mm · 13 of 441 slices shown, 15 images]
[im 19/441  soft-tissue]
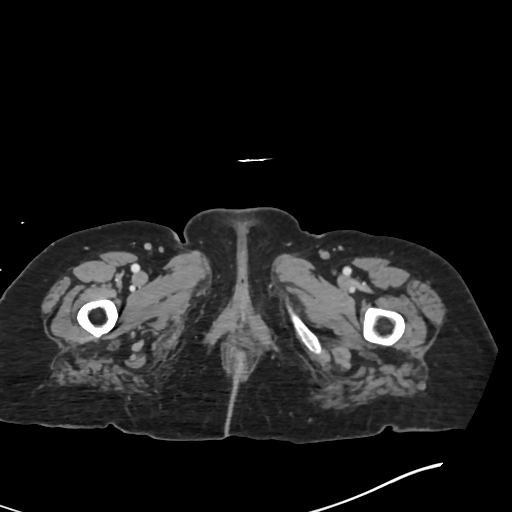
[im 19/441  bone]
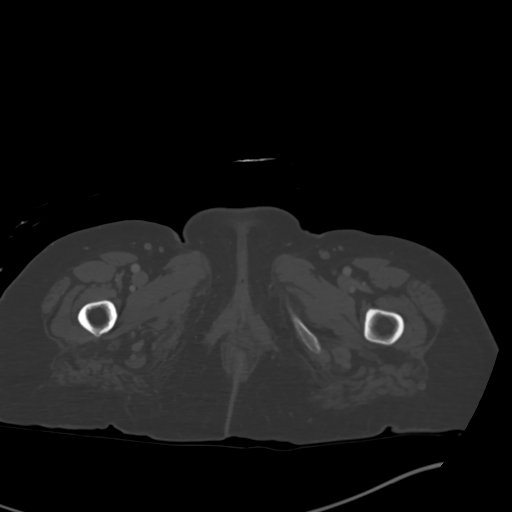
[im 56/441  soft-tissue]
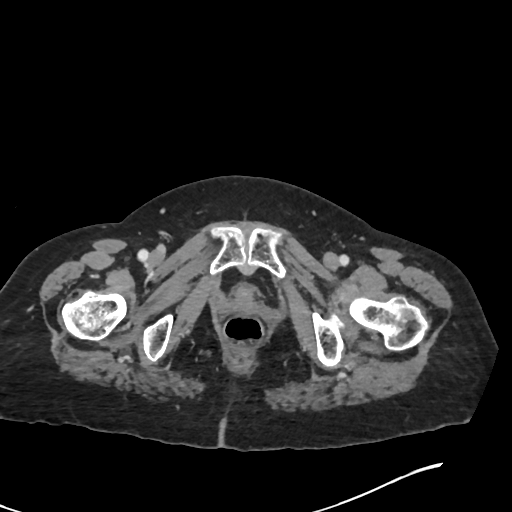
[im 92/441  soft-tissue]
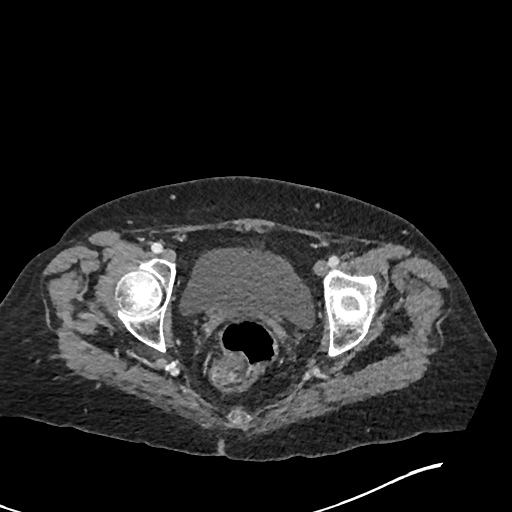
[im 129/441  soft-tissue]
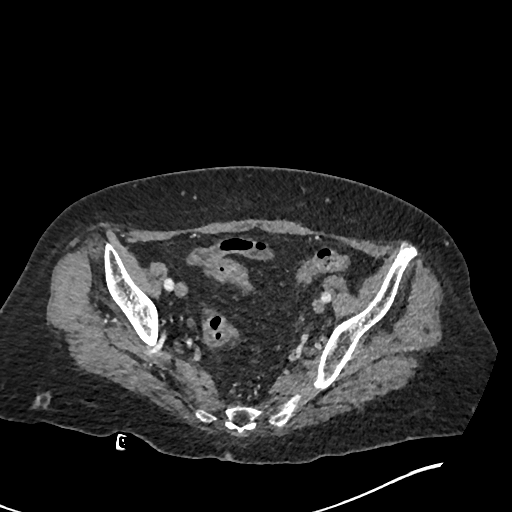
[im 147/441  soft-tissue]
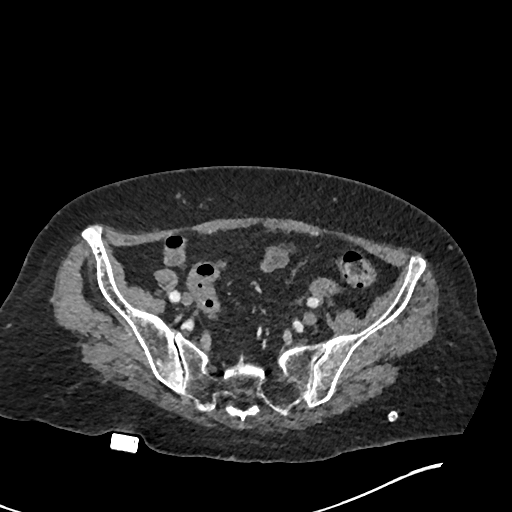
[im 184/441  soft-tissue]
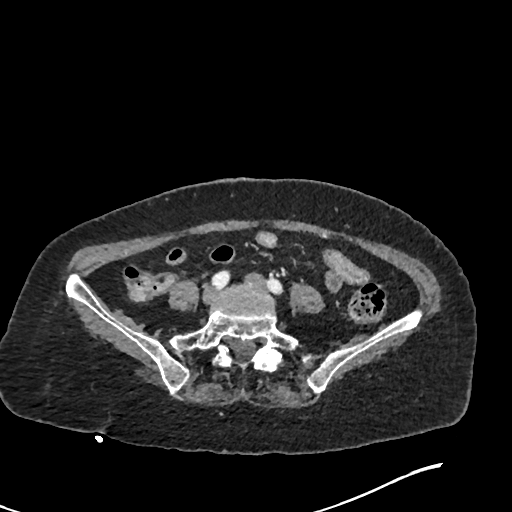
[im 221/441  soft-tissue]
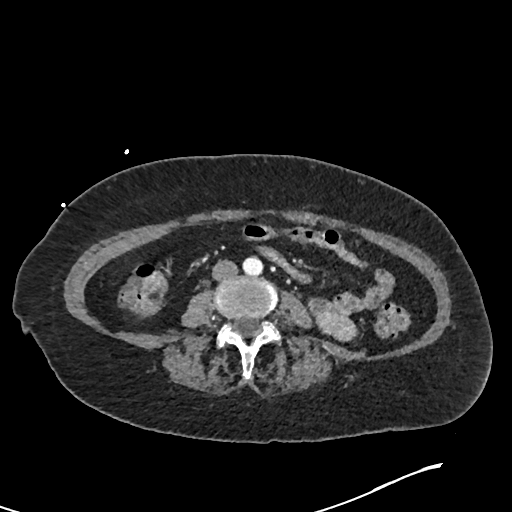
[im 257/441  soft-tissue]
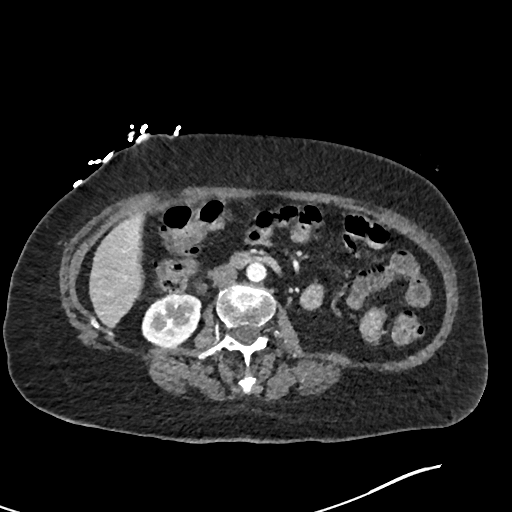
[im 294/441  soft-tissue]
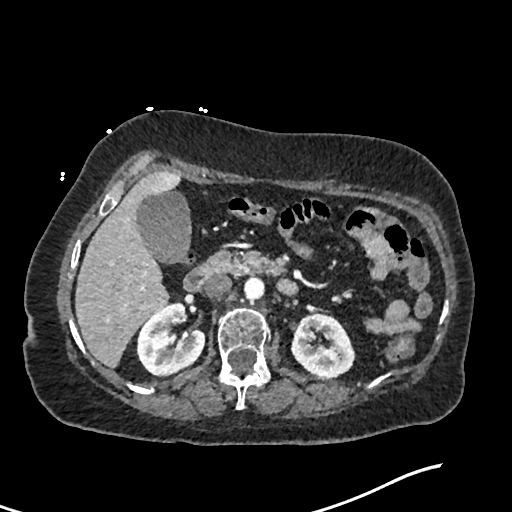
[im 294/441  bone]
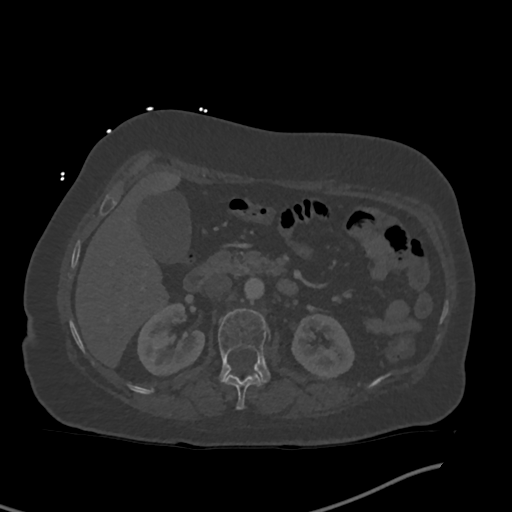
[im 312/441  soft-tissue]
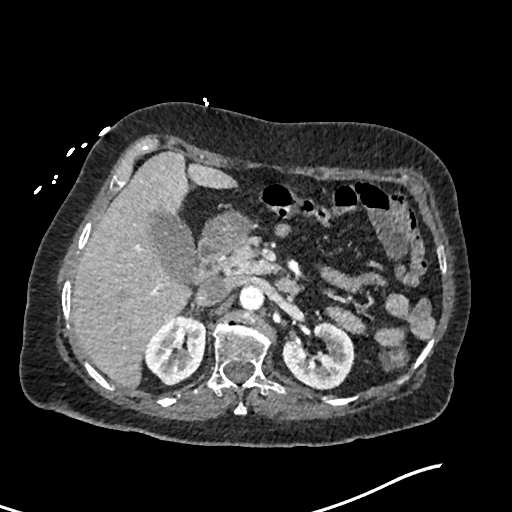
[im 349/441  soft-tissue]
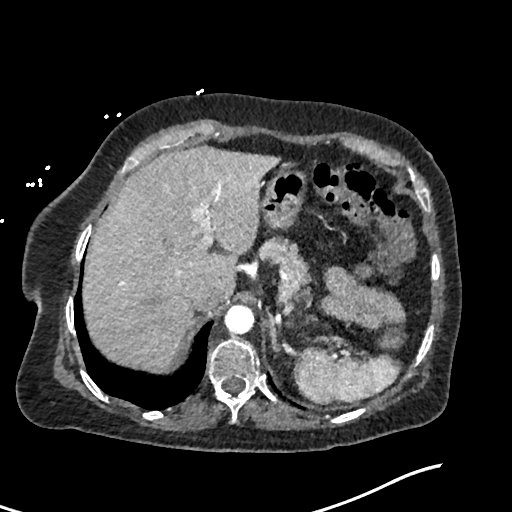
[im 386/441  soft-tissue]
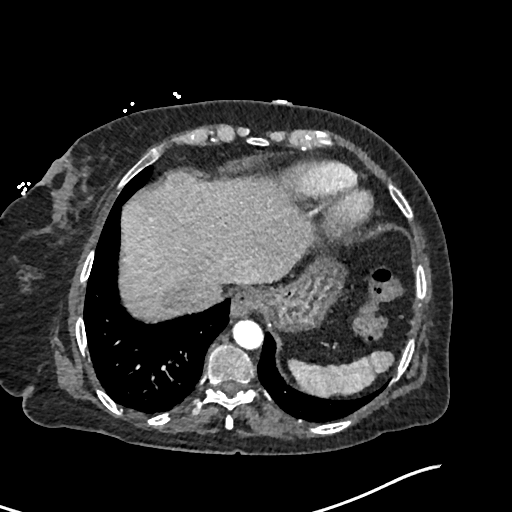
[im 422/441  soft-tissue]
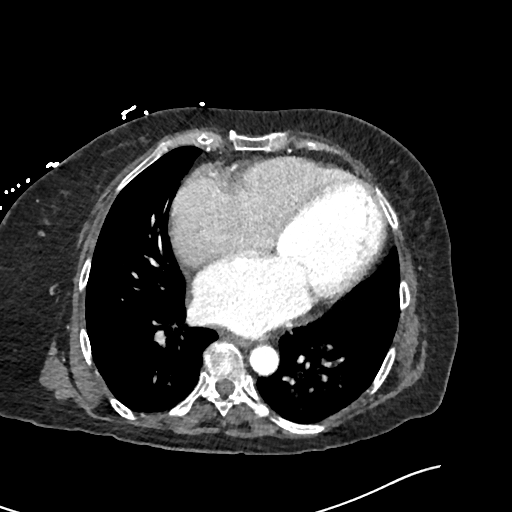

[Series 8: coronals · coronal · 0.76mm/px · 3 of 101 slices shown]
[im 34/101  soft-tissue]
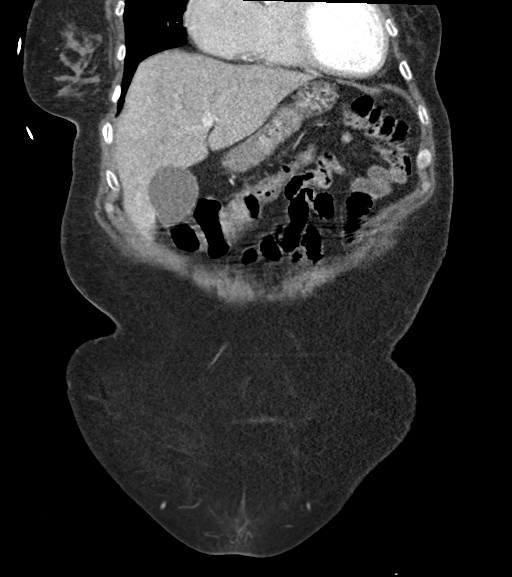
[im 45/101  soft-tissue]
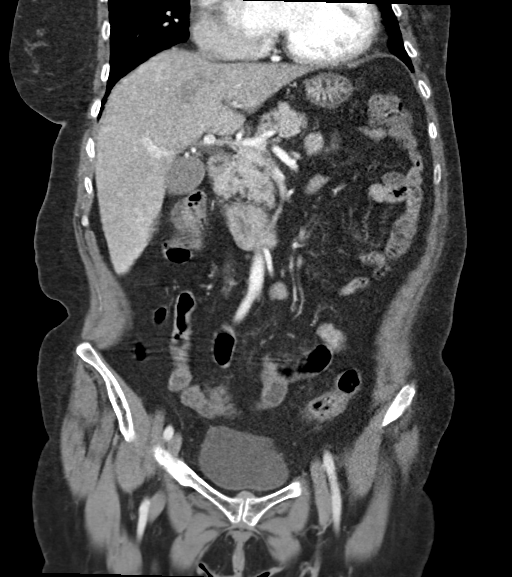
[im 56/101  soft-tissue]
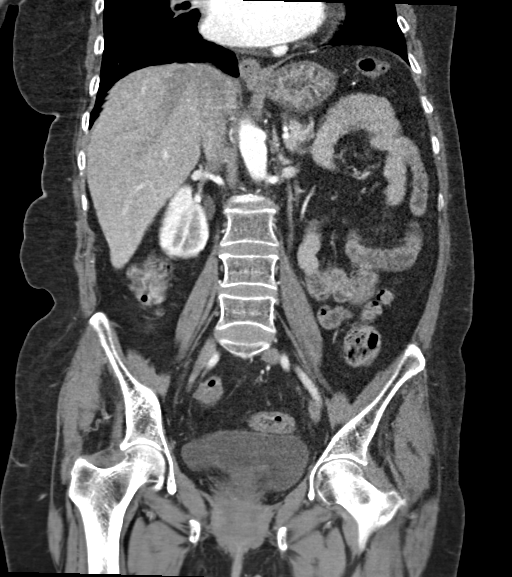

[16 of 46 positions shown; findings below may reference images not displayed]

RADIATION DOSE REDUCTION: This exam was performed according to the
departmental dose-optimization program which includes automated
exposure control, adjustment of the mA and/or kV according to
patient size and/or use of iterative reconstruction technique.

CONTRAST:  100mL OMNIPAQUE IOHEXOL 350 MG/ML SOLN
FINDINGS: CTA CHEST FINDINGS

Cardiovascular: Satisfactory opacification of pulmonary arteries
noted, and no pulmonary emboli identified. Moderate cardiomegaly
noted. Aortic and coronary atherosclerotic calcification
incidentally noted.

Mediastinum/Nodes: No masses or pathologically enlarged lymph nodes
identified.

Lungs/Pleura: Focal airspace disease seen in the right upper lobe,
likely due to pneumonia. No evidence of central endobronchial
obstruction or pleural effusion.

Musculoskeletal: No suspicious bone lesions identified.

Review of the MIP images confirms the above findings.

CT ABDOMEN and PELVIS FINDINGS

Hepatobiliary: No hepatic masses identified. Small benign cyst seen
near junction of right and left hepatic lobes. Gallbladder is
unremarkable. No evidence of biliary ductal dilatation.

Pancreas:  No mass or inflammatory changes.

Spleen: Within normal limits in size and appearance.

Adrenals/Urinary Tract: No masses identified. No evidence of
ureteral calculi or hydronephrosis.

Stomach/Bowel: No evidence of obstruction, inflammatory process or
abnormal fluid collections.

Vascular/Lymphatic: No pathologically enlarged lymph nodes. No acute
vascular findings.

Reproductive: Prior hysterectomy noted. Adnexal regions are
unremarkable in appearance.

Other:  None.

Musculoskeletal:  No suspicious bone lesions identified.

Review of the MIP images confirms the above findings.
IMPRESSION: No evidence of pulmonary embolism.

Focal airspace disease in right upper lobe, likely due to pneumonia.

No acute findings within the abdomen or pelvis.

Aortic Atherosclerosis ([id number].X).

## 2022-08-05 ENCOUNTER — Ambulatory Visit: Payer: Self-pay | Admitting: Internal Medicine
